# Patient Record
Sex: Female | Born: 1980 | Hispanic: Yes | Marital: Married | Smoking: Never smoker
Health system: Southern US, Community
[De-identification: ages and names within clinical notes are randomized; demographics above are authoritative.]

## PROBLEM LIST (undated history)

## (undated) DIAGNOSIS — T4145XA Adverse effect of unspecified anesthetic, initial encounter: Secondary | ICD-10-CM

## (undated) DIAGNOSIS — Z8049 Family history of malignant neoplasm of other genital organs: Secondary | ICD-10-CM

## (undated) DIAGNOSIS — E0789 Other specified disorders of thyroid: Secondary | ICD-10-CM

## (undated) DIAGNOSIS — T8859XA Other complications of anesthesia, initial encounter: Secondary | ICD-10-CM

## (undated) DIAGNOSIS — Z803 Family history of malignant neoplasm of breast: Secondary | ICD-10-CM

## (undated) DIAGNOSIS — Z8632 Personal history of gestational diabetes: Secondary | ICD-10-CM

## (undated) DIAGNOSIS — N63 Unspecified lump in unspecified breast: Secondary | ICD-10-CM

## (undated) DIAGNOSIS — T783XXA Angioneurotic edema, initial encounter: Secondary | ICD-10-CM

## (undated) DIAGNOSIS — L309 Dermatitis, unspecified: Secondary | ICD-10-CM

## (undated) DIAGNOSIS — F419 Anxiety disorder, unspecified: Secondary | ICD-10-CM

## (undated) DIAGNOSIS — I499 Cardiac arrhythmia, unspecified: Secondary | ICD-10-CM

## (undated) DIAGNOSIS — L509 Urticaria, unspecified: Secondary | ICD-10-CM

## (undated) DIAGNOSIS — T7840XA Allergy, unspecified, initial encounter: Secondary | ICD-10-CM

## (undated) DIAGNOSIS — F32A Depression, unspecified: Secondary | ICD-10-CM

## (undated) DIAGNOSIS — Z8042 Family history of malignant neoplasm of prostate: Secondary | ICD-10-CM

## (undated) DIAGNOSIS — Z800 Family history of malignant neoplasm of digestive organs: Secondary | ICD-10-CM

## (undated) DIAGNOSIS — F411 Generalized anxiety disorder: Secondary | ICD-10-CM

## (undated) DIAGNOSIS — Z8489 Family history of other specified conditions: Secondary | ICD-10-CM

## (undated) DIAGNOSIS — E119 Type 2 diabetes mellitus without complications: Secondary | ICD-10-CM

## (undated) DIAGNOSIS — G709 Myoneural disorder, unspecified: Secondary | ICD-10-CM

## (undated) DIAGNOSIS — R011 Cardiac murmur, unspecified: Secondary | ICD-10-CM

## (undated) DIAGNOSIS — N920 Excessive and frequent menstruation with regular cycle: Secondary | ICD-10-CM

## (undated) DIAGNOSIS — Z808 Family history of malignant neoplasm of other organs or systems: Secondary | ICD-10-CM

## (undated) DIAGNOSIS — F329 Major depressive disorder, single episode, unspecified: Secondary | ICD-10-CM

## (undated) DIAGNOSIS — G971 Other reaction to spinal and lumbar puncture: Secondary | ICD-10-CM

## (undated) DIAGNOSIS — M67431 Ganglion, right wrist: Secondary | ICD-10-CM

## (undated) DIAGNOSIS — R7303 Prediabetes: Secondary | ICD-10-CM

## (undated) DIAGNOSIS — A630 Anogenital (venereal) warts: Secondary | ICD-10-CM

## (undated) HISTORY — DX: Family history of malignant neoplasm of other organs or systems: Z80.8

## (undated) HISTORY — DX: Anogenital (venereal) warts: A63.0

## (undated) HISTORY — DX: Dermatitis, unspecified: L30.9

## (undated) HISTORY — DX: Allergy, unspecified, initial encounter: T78.40XA

## (undated) HISTORY — DX: Family history of malignant neoplasm of other genital organs: Z80.49

## (undated) HISTORY — DX: Family history of malignant neoplasm of breast: Z80.3

## (undated) HISTORY — DX: Other specified disorders of thyroid: E07.89

## (undated) HISTORY — DX: Ganglion, right wrist: M67.431

## (undated) HISTORY — DX: Urticaria, unspecified: L50.9

## (undated) HISTORY — DX: Myoneural disorder, unspecified: G70.9

## (undated) HISTORY — DX: Unspecified lump in unspecified breast: N63.0

## (undated) HISTORY — DX: Angioneurotic edema, initial encounter: T78.3XXA

## (undated) HISTORY — DX: Type 2 diabetes mellitus without complications: E11.9

## (undated) HISTORY — DX: Generalized anxiety disorder: F41.1

## (undated) HISTORY — DX: Cardiac murmur, unspecified: R01.1

## (undated) HISTORY — DX: Cardiac arrhythmia, unspecified: I49.9

## (undated) HISTORY — DX: Anxiety disorder, unspecified: F41.9

## (undated) HISTORY — DX: Family history of malignant neoplasm of digestive organs: Z80.0

## (undated) HISTORY — DX: Family history of malignant neoplasm of prostate: Z80.42

## (undated) HISTORY — PX: TUBAL LIGATION: SHX77

## (undated) HISTORY — DX: Depression, unspecified: F32.A

## (undated) HISTORY — DX: Excessive and frequent menstruation with regular cycle: N92.0

## (undated) HISTORY — DX: Major depressive disorder, single episode, unspecified: F32.9

---

## 2001-05-24 HISTORY — PX: NASAL SINUS SURGERY: SHX719

## 2002-12-13 ENCOUNTER — Emergency Department (HOSPITAL_COMMUNITY): Admission: EM | Admit: 2002-12-13 | Discharge: 2002-12-13 | Payer: Self-pay | Admitting: *Deleted

## 2003-01-09 ENCOUNTER — Ambulatory Visit: Admission: RE | Admit: 2003-01-09 | Discharge: 2003-01-09 | Payer: Self-pay | Admitting: *Deleted

## 2003-04-23 ENCOUNTER — Inpatient Hospital Stay (HOSPITAL_COMMUNITY): Admission: AD | Admit: 2003-04-23 | Discharge: 2003-04-23 | Payer: Self-pay | Admitting: *Deleted

## 2003-06-03 ENCOUNTER — Ambulatory Visit (HOSPITAL_COMMUNITY): Admission: RE | Admit: 2003-06-03 | Discharge: 2003-06-03 | Payer: Self-pay | Admitting: *Deleted

## 2003-06-11 ENCOUNTER — Inpatient Hospital Stay (HOSPITAL_COMMUNITY): Admission: AD | Admit: 2003-06-11 | Discharge: 2003-06-11 | Payer: Self-pay | Admitting: *Deleted

## 2003-06-14 ENCOUNTER — Encounter: Admission: RE | Admit: 2003-06-14 | Discharge: 2003-06-14 | Payer: Self-pay | Admitting: *Deleted

## 2003-06-18 ENCOUNTER — Encounter: Admission: RE | Admit: 2003-06-18 | Discharge: 2003-06-18 | Payer: Self-pay | Admitting: *Deleted

## 2003-06-18 ENCOUNTER — Inpatient Hospital Stay (HOSPITAL_COMMUNITY): Admission: AD | Admit: 2003-06-18 | Discharge: 2003-06-22 | Payer: Self-pay | Admitting: *Deleted

## 2003-06-19 ENCOUNTER — Encounter (INDEPENDENT_AMBULATORY_CARE_PROVIDER_SITE_OTHER): Payer: Self-pay | Admitting: Specialist

## 2003-08-28 ENCOUNTER — Encounter: Admission: RE | Admit: 2003-08-28 | Discharge: 2003-08-28 | Payer: Self-pay | Admitting: Family Medicine

## 2003-10-30 ENCOUNTER — Encounter: Admission: RE | Admit: 2003-10-30 | Discharge: 2003-10-30 | Payer: Self-pay | Admitting: Family Medicine

## 2003-11-27 ENCOUNTER — Encounter: Admission: RE | Admit: 2003-11-27 | Discharge: 2003-11-27 | Payer: Self-pay | Admitting: Family Medicine

## 2003-12-16 ENCOUNTER — Encounter: Admission: RE | Admit: 2003-12-16 | Discharge: 2003-12-16 | Payer: Self-pay | Admitting: Family Medicine

## 2003-12-17 ENCOUNTER — Encounter: Admission: RE | Admit: 2003-12-17 | Discharge: 2003-12-17 | Payer: Self-pay | Admitting: Family Medicine

## 2004-01-22 ENCOUNTER — Encounter: Admission: RE | Admit: 2004-01-22 | Discharge: 2004-01-22 | Payer: Self-pay | Admitting: Family Medicine

## 2004-03-11 ENCOUNTER — Encounter (INDEPENDENT_AMBULATORY_CARE_PROVIDER_SITE_OTHER): Payer: Self-pay | Admitting: Family Medicine

## 2004-03-11 ENCOUNTER — Ambulatory Visit: Payer: Self-pay | Admitting: Family Medicine

## 2004-07-08 ENCOUNTER — Ambulatory Visit: Payer: Self-pay | Admitting: Family Medicine

## 2004-07-11 ENCOUNTER — Emergency Department (HOSPITAL_COMMUNITY): Admission: EM | Admit: 2004-07-11 | Discharge: 2004-07-11 | Payer: Self-pay | Admitting: Family Medicine

## 2004-08-05 ENCOUNTER — Ambulatory Visit: Payer: Self-pay | Admitting: Family Medicine

## 2004-08-21 ENCOUNTER — Ambulatory Visit: Payer: Self-pay | Admitting: Sports Medicine

## 2004-09-01 ENCOUNTER — Encounter: Admission: RE | Admit: 2004-09-01 | Discharge: 2004-11-30 | Payer: Self-pay | Admitting: Family Medicine

## 2004-09-02 ENCOUNTER — Emergency Department (HOSPITAL_COMMUNITY): Admission: EM | Admit: 2004-09-02 | Discharge: 2004-09-02 | Payer: Self-pay | Admitting: Family Medicine

## 2004-09-02 ENCOUNTER — Emergency Department (HOSPITAL_COMMUNITY): Admission: EM | Admit: 2004-09-02 | Discharge: 2004-09-02 | Payer: Self-pay | Admitting: Emergency Medicine

## 2004-09-23 ENCOUNTER — Encounter (INDEPENDENT_AMBULATORY_CARE_PROVIDER_SITE_OTHER): Payer: Self-pay | Admitting: Family Medicine

## 2004-09-23 ENCOUNTER — Ambulatory Visit: Payer: Self-pay | Admitting: Family Medicine

## 2004-12-02 ENCOUNTER — Ambulatory Visit: Payer: Self-pay | Admitting: Family Medicine

## 2005-02-03 ENCOUNTER — Ambulatory Visit: Payer: Self-pay | Admitting: Family Medicine

## 2005-03-16 ENCOUNTER — Ambulatory Visit: Payer: Self-pay | Admitting: Sports Medicine

## 2005-03-16 ENCOUNTER — Encounter: Admission: RE | Admit: 2005-03-16 | Discharge: 2005-03-16 | Payer: Self-pay | Admitting: Family Medicine

## 2005-04-01 ENCOUNTER — Emergency Department (HOSPITAL_COMMUNITY): Admission: EM | Admit: 2005-04-01 | Discharge: 2005-04-01 | Payer: Self-pay | Admitting: Family Medicine

## 2005-04-06 ENCOUNTER — Ambulatory Visit: Payer: Self-pay | Admitting: Sports Medicine

## 2005-04-28 ENCOUNTER — Ambulatory Visit: Payer: Self-pay | Admitting: Family Medicine

## 2005-05-31 ENCOUNTER — Emergency Department (HOSPITAL_COMMUNITY): Admission: EM | Admit: 2005-05-31 | Discharge: 2005-05-31 | Payer: Self-pay | Admitting: Family Medicine

## 2005-06-15 ENCOUNTER — Ambulatory Visit: Payer: Self-pay | Admitting: Sports Medicine

## 2005-07-09 ENCOUNTER — Ambulatory Visit: Payer: Self-pay | Admitting: Sports Medicine

## 2005-07-18 ENCOUNTER — Emergency Department (HOSPITAL_COMMUNITY): Admission: EM | Admit: 2005-07-18 | Discharge: 2005-07-18 | Payer: Self-pay | Admitting: Family Medicine

## 2005-09-07 ENCOUNTER — Ambulatory Visit: Payer: Self-pay | Admitting: Sports Medicine

## 2005-09-20 ENCOUNTER — Emergency Department (HOSPITAL_COMMUNITY): Admission: EM | Admit: 2005-09-20 | Discharge: 2005-09-20 | Payer: Self-pay | Admitting: Family Medicine

## 2005-09-21 ENCOUNTER — Encounter (INDEPENDENT_AMBULATORY_CARE_PROVIDER_SITE_OTHER): Payer: Self-pay | Admitting: *Deleted

## 2005-09-24 ENCOUNTER — Ambulatory Visit: Payer: Self-pay | Admitting: Sports Medicine

## 2005-10-06 ENCOUNTER — Ambulatory Visit: Payer: Self-pay | Admitting: Family Medicine

## 2005-10-06 ENCOUNTER — Encounter (INDEPENDENT_AMBULATORY_CARE_PROVIDER_SITE_OTHER): Payer: Self-pay | Admitting: *Deleted

## 2005-10-29 ENCOUNTER — Ambulatory Visit: Payer: Self-pay | Admitting: Sports Medicine

## 2005-12-13 ENCOUNTER — Emergency Department (HOSPITAL_COMMUNITY): Admission: EM | Admit: 2005-12-13 | Discharge: 2005-12-13 | Payer: Self-pay | Admitting: Emergency Medicine

## 2006-01-18 ENCOUNTER — Ambulatory Visit: Payer: Self-pay | Admitting: Sports Medicine

## 2006-03-15 ENCOUNTER — Ambulatory Visit: Payer: Self-pay | Admitting: Sports Medicine

## 2006-06-10 ENCOUNTER — Ambulatory Visit: Payer: Self-pay | Admitting: Family Medicine

## 2006-06-10 ENCOUNTER — Encounter (INDEPENDENT_AMBULATORY_CARE_PROVIDER_SITE_OTHER): Payer: Self-pay | Admitting: Family Medicine

## 2006-06-10 LAB — CONVERTED CEMR LAB
Antibody Screen: NEGATIVE
Eosinophils Relative: 1 % (ref 0–5)
HCT: 39.2 % (ref 36.0–46.0)
Hemoglobin: 12.8 g/dL (ref 12.0–15.0)
Lymphocytes Relative: 21 % (ref 12–46)
Lymphs Abs: 2.3 10*3/uL (ref 0.7–3.3)
Monocytes Absolute: 0.6 10*3/uL (ref 0.2–0.7)
Monocytes Relative: 6 % (ref 3–11)
RDW: 13.8 % (ref 11.5–14.0)
Rh Type: POSITIVE
WBC: 10.9 10*3/uL — ABNORMAL HIGH (ref 4.0–10.5)

## 2006-06-17 ENCOUNTER — Encounter (INDEPENDENT_AMBULATORY_CARE_PROVIDER_SITE_OTHER): Payer: Self-pay | Admitting: Family Medicine

## 2006-06-17 ENCOUNTER — Ambulatory Visit: Payer: Self-pay | Admitting: Family Medicine

## 2006-06-17 ENCOUNTER — Ambulatory Visit (HOSPITAL_COMMUNITY): Admission: RE | Admit: 2006-06-17 | Discharge: 2006-06-17 | Payer: Self-pay | Admitting: Family Medicine

## 2006-06-17 LAB — CONVERTED CEMR LAB
Chlamydia, DNA Probe: NEGATIVE
GC Probe Amp, Genital: NEGATIVE

## 2006-07-06 ENCOUNTER — Encounter (INDEPENDENT_AMBULATORY_CARE_PROVIDER_SITE_OTHER): Payer: Self-pay | Admitting: *Deleted

## 2006-07-06 ENCOUNTER — Ambulatory Visit: Payer: Self-pay | Admitting: Family Medicine

## 2006-07-08 ENCOUNTER — Ambulatory Visit: Payer: Self-pay | Admitting: Family Medicine

## 2006-07-21 DIAGNOSIS — A630 Anogenital (venereal) warts: Secondary | ICD-10-CM

## 2006-07-21 DIAGNOSIS — G44209 Tension-type headache, unspecified, not intractable: Secondary | ICD-10-CM

## 2006-07-21 HISTORY — DX: Anogenital (venereal) warts: A63.0

## 2006-07-22 ENCOUNTER — Encounter (INDEPENDENT_AMBULATORY_CARE_PROVIDER_SITE_OTHER): Payer: Self-pay | Admitting: *Deleted

## 2006-08-06 ENCOUNTER — Emergency Department (HOSPITAL_COMMUNITY): Admission: EM | Admit: 2006-08-06 | Discharge: 2006-08-06 | Payer: Self-pay | Admitting: *Deleted

## 2006-08-08 ENCOUNTER — Ambulatory Visit: Payer: Self-pay | Admitting: Sports Medicine

## 2006-08-08 ENCOUNTER — Telehealth (INDEPENDENT_AMBULATORY_CARE_PROVIDER_SITE_OTHER): Payer: Self-pay | Admitting: *Deleted

## 2006-08-12 ENCOUNTER — Ambulatory Visit: Payer: Self-pay | Admitting: Family Medicine

## 2006-08-12 ENCOUNTER — Ambulatory Visit (HOSPITAL_COMMUNITY): Admission: RE | Admit: 2006-08-12 | Discharge: 2006-08-12 | Payer: Self-pay | Admitting: Gynecology

## 2006-08-29 ENCOUNTER — Encounter: Payer: Self-pay | Admitting: *Deleted

## 2006-08-30 ENCOUNTER — Ambulatory Visit (HOSPITAL_COMMUNITY): Admission: RE | Admit: 2006-08-30 | Discharge: 2006-08-30 | Payer: Self-pay | Admitting: Internal Medicine

## 2006-09-14 ENCOUNTER — Encounter (INDEPENDENT_AMBULATORY_CARE_PROVIDER_SITE_OTHER): Payer: Self-pay | Admitting: Family Medicine

## 2006-09-14 ENCOUNTER — Ambulatory Visit: Payer: Self-pay | Admitting: Family Medicine

## 2006-09-28 ENCOUNTER — Telehealth (INDEPENDENT_AMBULATORY_CARE_PROVIDER_SITE_OTHER): Payer: Self-pay | Admitting: Family Medicine

## 2006-10-03 ENCOUNTER — Ambulatory Visit: Payer: Self-pay | Admitting: Physician Assistant

## 2006-10-03 ENCOUNTER — Encounter (INDEPENDENT_AMBULATORY_CARE_PROVIDER_SITE_OTHER): Payer: Self-pay | Admitting: Family Medicine

## 2006-10-03 ENCOUNTER — Inpatient Hospital Stay (HOSPITAL_COMMUNITY): Admission: AD | Admit: 2006-10-03 | Discharge: 2006-10-04 | Payer: Self-pay | Admitting: Gynecology

## 2006-10-14 ENCOUNTER — Ambulatory Visit: Payer: Self-pay | Admitting: Family Medicine

## 2006-10-26 ENCOUNTER — Ambulatory Visit: Payer: Self-pay | Admitting: Family Medicine

## 2006-11-02 ENCOUNTER — Telehealth: Payer: Self-pay | Admitting: *Deleted

## 2006-11-04 ENCOUNTER — Encounter: Payer: Self-pay | Admitting: *Deleted

## 2006-11-10 ENCOUNTER — Ambulatory Visit: Payer: Self-pay | Admitting: Sports Medicine

## 2006-11-15 ENCOUNTER — Ambulatory Visit: Payer: Self-pay | Admitting: Family Medicine

## 2006-11-18 ENCOUNTER — Encounter (INDEPENDENT_AMBULATORY_CARE_PROVIDER_SITE_OTHER): Payer: Self-pay | Admitting: Family Medicine

## 2006-11-24 ENCOUNTER — Ambulatory Visit: Payer: Self-pay | Admitting: Family Medicine

## 2006-12-05 ENCOUNTER — Ambulatory Visit: Payer: Self-pay | Admitting: Obstetrics & Gynecology

## 2006-12-05 ENCOUNTER — Encounter: Admission: RE | Admit: 2006-12-05 | Discharge: 2006-12-09 | Payer: Self-pay | Admitting: Family Medicine

## 2006-12-06 ENCOUNTER — Inpatient Hospital Stay (HOSPITAL_COMMUNITY): Admission: AD | Admit: 2006-12-06 | Discharge: 2006-12-06 | Payer: Self-pay | Admitting: Obstetrics & Gynecology

## 2006-12-06 ENCOUNTER — Encounter: Payer: Self-pay | Admitting: *Deleted

## 2006-12-08 ENCOUNTER — Ambulatory Visit: Payer: Self-pay | Admitting: Obstetrics & Gynecology

## 2006-12-12 ENCOUNTER — Inpatient Hospital Stay (HOSPITAL_COMMUNITY): Admission: AD | Admit: 2006-12-12 | Discharge: 2006-12-13 | Payer: Self-pay | Admitting: Obstetrics & Gynecology

## 2006-12-12 ENCOUNTER — Ambulatory Visit: Payer: Self-pay | Admitting: Family Medicine

## 2006-12-13 ENCOUNTER — Ambulatory Visit: Payer: Self-pay | Admitting: Obstetrics and Gynecology

## 2006-12-14 ENCOUNTER — Inpatient Hospital Stay (HOSPITAL_COMMUNITY): Admission: AD | Admit: 2006-12-14 | Discharge: 2006-12-14 | Payer: Self-pay | Admitting: Obstetrics and Gynecology

## 2006-12-14 ENCOUNTER — Ambulatory Visit: Payer: Self-pay | Admitting: *Deleted

## 2006-12-15 ENCOUNTER — Ambulatory Visit: Payer: Self-pay | Admitting: Obstetrics & Gynecology

## 2006-12-19 ENCOUNTER — Ambulatory Visit: Payer: Self-pay | Admitting: Obstetrics & Gynecology

## 2006-12-22 ENCOUNTER — Ambulatory Visit: Payer: Self-pay | Admitting: Obstetrics and Gynecology

## 2006-12-26 ENCOUNTER — Ambulatory Visit: Payer: Self-pay | Admitting: Obstetrics & Gynecology

## 2006-12-29 ENCOUNTER — Ambulatory Visit: Payer: Self-pay | Admitting: Family Medicine

## 2007-01-02 ENCOUNTER — Ambulatory Visit: Payer: Self-pay | Admitting: Obstetrics & Gynecology

## 2007-01-05 ENCOUNTER — Ambulatory Visit: Payer: Self-pay | Admitting: Obstetrics and Gynecology

## 2007-01-09 ENCOUNTER — Ambulatory Visit: Payer: Self-pay | Admitting: Obstetrics & Gynecology

## 2007-01-12 ENCOUNTER — Ambulatory Visit: Payer: Self-pay | Admitting: Obstetrics & Gynecology

## 2007-01-16 ENCOUNTER — Inpatient Hospital Stay (HOSPITAL_COMMUNITY): Admission: AD | Admit: 2007-01-16 | Discharge: 2007-01-20 | Payer: Self-pay | Admitting: Obstetrics & Gynecology

## 2007-01-16 ENCOUNTER — Ambulatory Visit: Payer: Self-pay | Admitting: Obstetrics & Gynecology

## 2007-01-16 ENCOUNTER — Encounter: Payer: Self-pay | Admitting: *Deleted

## 2007-01-18 ENCOUNTER — Ambulatory Visit: Payer: Self-pay | Admitting: Gynecology

## 2007-01-31 ENCOUNTER — Ambulatory Visit: Payer: Self-pay | Admitting: Family Medicine

## 2007-01-31 ENCOUNTER — Encounter: Payer: Self-pay | Admitting: Family Medicine

## 2007-01-31 ENCOUNTER — Telehealth (INDEPENDENT_AMBULATORY_CARE_PROVIDER_SITE_OTHER): Payer: Self-pay | Admitting: *Deleted

## 2007-01-31 LAB — CONVERTED CEMR LAB
Ketones, urine, test strip: NEGATIVE
Nitrite: NEGATIVE
Urobilinogen, UA: 0.2
Whiff Test: NEGATIVE

## 2007-02-02 ENCOUNTER — Ambulatory Visit: Payer: Self-pay | Admitting: Family Medicine

## 2007-02-06 ENCOUNTER — Telehealth (INDEPENDENT_AMBULATORY_CARE_PROVIDER_SITE_OTHER): Payer: Self-pay | Admitting: *Deleted

## 2007-03-16 ENCOUNTER — Ambulatory Visit: Payer: Self-pay | Admitting: Family Medicine

## 2007-03-16 ENCOUNTER — Encounter (INDEPENDENT_AMBULATORY_CARE_PROVIDER_SITE_OTHER): Payer: Self-pay | Admitting: Family Medicine

## 2007-03-16 LAB — CONVERTED CEMR LAB
BUN: 13 mg/dL (ref 6–23)
CO2: 26 meq/L (ref 19–32)
Calcium: 9.6 mg/dL (ref 8.4–10.5)
Creatinine, Ser: 0.7 mg/dL (ref 0.40–1.20)
HCT: 41.5 % (ref 36.0–46.0)
Hemoglobin: 13.2 g/dL (ref 12.0–15.0)
MCV: 97 fL (ref 78.0–100.0)
Platelets: 327 10*3/uL (ref 150–400)
WBC: 9.1 10*3/uL (ref 4.0–10.5)

## 2007-03-22 ENCOUNTER — Ambulatory Visit: Payer: Self-pay | Admitting: Family Medicine

## 2007-03-27 ENCOUNTER — Ambulatory Visit (HOSPITAL_COMMUNITY): Admission: RE | Admit: 2007-03-27 | Discharge: 2007-03-27 | Payer: Self-pay | Admitting: Family Medicine

## 2007-09-22 ENCOUNTER — Ambulatory Visit: Payer: Self-pay | Admitting: Family Medicine

## 2007-09-22 ENCOUNTER — Encounter (INDEPENDENT_AMBULATORY_CARE_PROVIDER_SITE_OTHER): Payer: Self-pay | Admitting: Family Medicine

## 2007-09-22 LAB — CONVERTED CEMR LAB
BUN: 11 mg/dL (ref 6–23)
Beta hcg, urine, semiquantitative: NEGATIVE
Calcium: 9.5 mg/dL (ref 8.4–10.5)
Chloride: 104 meq/L (ref 96–112)
Creatinine, Ser: 0.63 mg/dL (ref 0.40–1.20)
HCT: 38.6 % (ref 36.0–46.0)
Hemoglobin: 13 g/dL (ref 12.0–15.0)
MCHC: 33.7 g/dL (ref 30.0–36.0)
MCV: 93 fL (ref 78.0–100.0)
Platelets: 277 10*3/uL (ref 150–400)
RDW: 13.6 % (ref 11.5–15.5)
TSH: 0.962 microintl units/mL (ref 0.350–5.50)

## 2007-09-28 ENCOUNTER — Ambulatory Visit: Payer: Self-pay | Admitting: Family Medicine

## 2007-09-28 ENCOUNTER — Telehealth: Payer: Self-pay | Admitting: *Deleted

## 2007-10-12 ENCOUNTER — Encounter (INDEPENDENT_AMBULATORY_CARE_PROVIDER_SITE_OTHER): Payer: Self-pay | Admitting: *Deleted

## 2007-10-21 ENCOUNTER — Emergency Department (HOSPITAL_COMMUNITY): Admission: EM | Admit: 2007-10-21 | Discharge: 2007-10-21 | Payer: Self-pay | Admitting: Emergency Medicine

## 2007-10-26 ENCOUNTER — Ambulatory Visit (HOSPITAL_COMMUNITY): Admission: RE | Admit: 2007-10-26 | Discharge: 2007-10-26 | Payer: Self-pay | Admitting: Emergency Medicine

## 2007-11-03 ENCOUNTER — Telehealth: Payer: Self-pay | Admitting: Family Medicine

## 2007-12-20 ENCOUNTER — Telehealth: Payer: Self-pay | Admitting: *Deleted

## 2007-12-21 ENCOUNTER — Ambulatory Visit: Payer: Self-pay | Admitting: Family Medicine

## 2007-12-21 LAB — CONVERTED CEMR LAB
Beta hcg, urine, semiquantitative: NEGATIVE
Bilirubin Urine: NEGATIVE
Glucose, Urine, Semiquant: NEGATIVE
Specific Gravity, Urine: 1.025
WBC Urine, dipstick: NEGATIVE
pH: 5.5

## 2007-12-22 ENCOUNTER — Ambulatory Visit: Payer: Self-pay | Admitting: Family Medicine

## 2007-12-22 ENCOUNTER — Ambulatory Visit (HOSPITAL_COMMUNITY): Admission: RE | Admit: 2007-12-22 | Discharge: 2007-12-22 | Payer: Self-pay | Admitting: Family Medicine

## 2007-12-22 ENCOUNTER — Encounter: Payer: Self-pay | Admitting: Family Medicine

## 2007-12-25 ENCOUNTER — Telehealth: Payer: Self-pay | Admitting: *Deleted

## 2007-12-26 ENCOUNTER — Encounter: Payer: Self-pay | Admitting: *Deleted

## 2007-12-26 ENCOUNTER — Ambulatory Visit: Payer: Self-pay | Admitting: Family Medicine

## 2008-01-02 ENCOUNTER — Encounter: Payer: Self-pay | Admitting: Family Medicine

## 2008-01-10 ENCOUNTER — Encounter (INDEPENDENT_AMBULATORY_CARE_PROVIDER_SITE_OTHER): Payer: Self-pay | Admitting: *Deleted

## 2008-01-11 ENCOUNTER — Emergency Department (HOSPITAL_COMMUNITY): Admission: EM | Admit: 2008-01-11 | Discharge: 2008-01-11 | Payer: Self-pay | Admitting: Emergency Medicine

## 2008-02-07 ENCOUNTER — Telehealth: Payer: Self-pay | Admitting: *Deleted

## 2008-02-07 ENCOUNTER — Ambulatory Visit: Payer: Self-pay | Admitting: Family Medicine

## 2008-02-07 ENCOUNTER — Encounter (INDEPENDENT_AMBULATORY_CARE_PROVIDER_SITE_OTHER): Payer: Self-pay | Admitting: Family Medicine

## 2008-02-08 LAB — CONVERTED CEMR LAB: GC Probe Amp, Genital: NEGATIVE

## 2008-02-23 ENCOUNTER — Ambulatory Visit: Payer: Self-pay | Admitting: Family Medicine

## 2008-02-26 ENCOUNTER — Telehealth (INDEPENDENT_AMBULATORY_CARE_PROVIDER_SITE_OTHER): Payer: Self-pay | Admitting: *Deleted

## 2008-02-27 ENCOUNTER — Encounter (INDEPENDENT_AMBULATORY_CARE_PROVIDER_SITE_OTHER): Payer: Self-pay | Admitting: *Deleted

## 2008-02-28 ENCOUNTER — Ambulatory Visit: Payer: Self-pay | Admitting: Family Medicine

## 2008-02-28 ENCOUNTER — Encounter (INDEPENDENT_AMBULATORY_CARE_PROVIDER_SITE_OTHER): Payer: Self-pay | Admitting: Family Medicine

## 2008-02-28 LAB — CONVERTED CEMR LAB
Beta hcg, urine, semiquantitative: NEGATIVE
Bilirubin Urine: NEGATIVE
Glucose, Urine, Semiquant: NEGATIVE
Protein, U semiquant: NEGATIVE
Specific Gravity, Urine: 1.015
pH: 5.5

## 2008-03-01 ENCOUNTER — Telehealth (INDEPENDENT_AMBULATORY_CARE_PROVIDER_SITE_OTHER): Payer: Self-pay | Admitting: Family Medicine

## 2008-03-20 ENCOUNTER — Ambulatory Visit: Payer: Self-pay | Admitting: Family Medicine

## 2008-03-20 ENCOUNTER — Encounter (INDEPENDENT_AMBULATORY_CARE_PROVIDER_SITE_OTHER): Payer: Self-pay | Admitting: Family Medicine

## 2008-03-20 DIAGNOSIS — D352 Benign neoplasm of pituitary gland: Secondary | ICD-10-CM | POA: Insufficient documentation

## 2008-03-20 DIAGNOSIS — D353 Benign neoplasm of craniopharyngeal duct: Secondary | ICD-10-CM

## 2008-03-20 HISTORY — DX: Benign neoplasm of pituitary gland: D35.2

## 2008-03-22 ENCOUNTER — Telehealth: Payer: Self-pay | Admitting: *Deleted

## 2008-03-28 ENCOUNTER — Encounter: Admission: RE | Admit: 2008-03-28 | Discharge: 2008-03-28 | Payer: Self-pay | Admitting: Family Medicine

## 2008-03-28 ENCOUNTER — Encounter: Payer: Self-pay | Admitting: Family Medicine

## 2008-06-21 ENCOUNTER — Emergency Department (HOSPITAL_COMMUNITY): Admission: EM | Admit: 2008-06-21 | Discharge: 2008-06-21 | Payer: Self-pay | Admitting: Family Medicine

## 2008-07-04 ENCOUNTER — Telehealth: Payer: Self-pay | Admitting: Family Medicine

## 2008-08-15 ENCOUNTER — Ambulatory Visit: Payer: Self-pay | Admitting: Family Medicine

## 2008-08-28 ENCOUNTER — Ambulatory Visit: Payer: Self-pay | Admitting: Family Medicine

## 2008-08-28 ENCOUNTER — Encounter: Payer: Self-pay | Admitting: Family Medicine

## 2008-08-28 DIAGNOSIS — F3289 Other specified depressive episodes: Secondary | ICD-10-CM | POA: Insufficient documentation

## 2008-08-28 DIAGNOSIS — F329 Major depressive disorder, single episode, unspecified: Secondary | ICD-10-CM | POA: Insufficient documentation

## 2008-08-29 LAB — CONVERTED CEMR LAB
CO2: 24 meq/L (ref 19–32)
Calcium: 9.4 mg/dL (ref 8.4–10.5)
Creatinine, Ser: 0.64 mg/dL (ref 0.40–1.20)
Glucose, Bld: 89 mg/dL (ref 70–99)
HCT: 39.4 % (ref 36.0–46.0)
MCHC: 33.2 g/dL (ref 30.0–36.0)
MCV: 91.8 fL (ref 78.0–100.0)
Platelets: 304 10*3/uL (ref 150–400)
RBC: 4.29 M/uL (ref 3.87–5.11)
Sodium: 137 meq/L (ref 135–145)
TSH: 0.836 microintl units/mL (ref 0.350–4.500)
WBC: 11.5 10*3/uL — ABNORMAL HIGH (ref 4.0–10.5)

## 2008-10-03 ENCOUNTER — Emergency Department (HOSPITAL_COMMUNITY): Admission: EM | Admit: 2008-10-03 | Discharge: 2008-10-03 | Payer: Self-pay | Admitting: Emergency Medicine

## 2008-11-27 ENCOUNTER — Encounter: Payer: Self-pay | Admitting: Family Medicine

## 2008-11-28 ENCOUNTER — Encounter: Payer: Self-pay | Admitting: Family Medicine

## 2008-11-28 ENCOUNTER — Ambulatory Visit: Payer: Self-pay | Admitting: Family Medicine

## 2008-11-28 DIAGNOSIS — E663 Overweight: Secondary | ICD-10-CM | POA: Insufficient documentation

## 2008-11-29 ENCOUNTER — Telehealth: Payer: Self-pay | Admitting: Family Medicine

## 2008-11-29 ENCOUNTER — Ambulatory Visit (HOSPITAL_COMMUNITY): Admission: RE | Admit: 2008-11-29 | Discharge: 2008-11-29 | Payer: Self-pay | Admitting: Obstetrics & Gynecology

## 2008-11-29 ENCOUNTER — Encounter: Payer: Self-pay | Admitting: Family Medicine

## 2008-12-02 ENCOUNTER — Encounter: Payer: Self-pay | Admitting: Family Medicine

## 2008-12-02 ENCOUNTER — Ambulatory Visit: Payer: Self-pay | Admitting: Family Medicine

## 2008-12-03 ENCOUNTER — Telehealth: Payer: Self-pay | Admitting: Family Medicine

## 2008-12-04 ENCOUNTER — Encounter: Payer: Self-pay | Admitting: Family Medicine

## 2008-12-04 ENCOUNTER — Ambulatory Visit: Payer: Self-pay | Admitting: Family Medicine

## 2008-12-04 LAB — CONVERTED CEMR LAB
Antibody Screen: NEGATIVE
Basophils Relative: 1 % (ref 0–1)
Eosinophils Absolute: 0.1 10*3/uL (ref 0.0–0.7)
Eosinophils Relative: 2 % (ref 0–5)
HCT: 39.1 % (ref 36.0–46.0)
Hemoglobin: 12.8 g/dL (ref 12.0–15.0)
Lymphs Abs: 2.1 10*3/uL (ref 0.7–4.0)
MCHC: 32.7 g/dL (ref 30.0–36.0)
MCV: 94.7 fL (ref 78.0–100.0)
Monocytes Absolute: 0.6 10*3/uL (ref 0.1–1.0)
Monocytes Relative: 7 % (ref 3–12)
RBC: 4.13 M/uL (ref 3.87–5.11)
Rh Type: POSITIVE
Rubella: 128.5 intl units/mL — ABNORMAL HIGH
Sickle Cell Screen: NEGATIVE
WBC: 8.6 10*3/uL (ref 4.0–10.5)

## 2008-12-05 ENCOUNTER — Telehealth: Payer: Self-pay | Admitting: Family Medicine

## 2008-12-09 ENCOUNTER — Encounter: Payer: Self-pay | Admitting: Family Medicine

## 2008-12-09 ENCOUNTER — Ambulatory Visit (HOSPITAL_COMMUNITY): Admission: RE | Admit: 2008-12-09 | Discharge: 2008-12-09 | Payer: Self-pay | Admitting: Family Medicine

## 2008-12-14 ENCOUNTER — Telehealth: Payer: Self-pay | Admitting: Family Medicine

## 2008-12-16 ENCOUNTER — Ambulatory Visit: Payer: Self-pay | Admitting: Family Medicine

## 2008-12-16 ENCOUNTER — Telehealth: Payer: Self-pay | Admitting: Family Medicine

## 2008-12-16 LAB — CONVERTED CEMR LAB: Whiff Test: NEGATIVE

## 2008-12-26 ENCOUNTER — Encounter: Payer: Self-pay | Admitting: Family Medicine

## 2008-12-26 ENCOUNTER — Ambulatory Visit: Payer: Self-pay | Admitting: Family Medicine

## 2008-12-26 LAB — CONVERTED CEMR LAB: Chlamydia, DNA Probe: NEGATIVE

## 2008-12-29 ENCOUNTER — Encounter: Payer: Self-pay | Admitting: Family Medicine

## 2008-12-31 ENCOUNTER — Ambulatory Visit: Payer: Self-pay | Admitting: Family Medicine

## 2008-12-31 ENCOUNTER — Encounter: Payer: Self-pay | Admitting: Family Medicine

## 2008-12-31 DIAGNOSIS — F411 Generalized anxiety disorder: Secondary | ICD-10-CM | POA: Insufficient documentation

## 2008-12-31 HISTORY — DX: Generalized anxiety disorder: F41.1

## 2009-01-01 ENCOUNTER — Telehealth: Payer: Self-pay | Admitting: Family Medicine

## 2009-01-07 ENCOUNTER — Ambulatory Visit (HOSPITAL_COMMUNITY): Admission: RE | Admit: 2009-01-07 | Discharge: 2009-01-07 | Payer: Self-pay | Admitting: Family Medicine

## 2009-01-07 ENCOUNTER — Ambulatory Visit: Payer: Self-pay | Admitting: Family Medicine

## 2009-01-07 ENCOUNTER — Telehealth: Payer: Self-pay | Admitting: Sports Medicine

## 2009-01-07 ENCOUNTER — Encounter: Payer: Self-pay | Admitting: Sports Medicine

## 2009-01-09 ENCOUNTER — Telehealth: Payer: Self-pay | Admitting: Sports Medicine

## 2009-01-20 ENCOUNTER — Telehealth: Payer: Self-pay | Admitting: Family Medicine

## 2009-01-21 ENCOUNTER — Ambulatory Visit: Payer: Self-pay | Admitting: Family Medicine

## 2009-01-21 LAB — CONVERTED CEMR LAB
Ketones, urine, test strip: NEGATIVE
Nitrite: NEGATIVE
Urobilinogen, UA: 0.2

## 2009-01-24 ENCOUNTER — Telehealth (INDEPENDENT_AMBULATORY_CARE_PROVIDER_SITE_OTHER): Payer: Self-pay | Admitting: *Deleted

## 2009-01-28 ENCOUNTER — Telehealth: Payer: Self-pay | Admitting: Family Medicine

## 2009-01-28 ENCOUNTER — Ambulatory Visit: Payer: Self-pay | Admitting: Family Medicine

## 2009-02-01 ENCOUNTER — Inpatient Hospital Stay (HOSPITAL_COMMUNITY): Admission: AD | Admit: 2009-02-01 | Discharge: 2009-02-01 | Payer: Self-pay | Admitting: Obstetrics & Gynecology

## 2009-02-01 ENCOUNTER — Ambulatory Visit: Payer: Self-pay | Admitting: Obstetrics and Gynecology

## 2009-02-05 ENCOUNTER — Encounter: Payer: Self-pay | Admitting: Family Medicine

## 2009-02-11 ENCOUNTER — Ambulatory Visit: Payer: Self-pay | Admitting: Family Medicine

## 2009-02-11 LAB — CONVERTED CEMR LAB
Glucose, Urine, Semiquant: NEGATIVE
Ketones, urine, test strip: NEGATIVE
Nitrite: NEGATIVE
Specific Gravity, Urine: 1.015
WBC Urine, dipstick: NEGATIVE
Whiff Test: NEGATIVE

## 2009-02-13 ENCOUNTER — Telehealth: Payer: Self-pay | Admitting: Family Medicine

## 2009-02-23 ENCOUNTER — Telehealth: Payer: Self-pay | Admitting: Family Medicine

## 2009-02-25 ENCOUNTER — Ambulatory Visit: Payer: Self-pay | Admitting: Family Medicine

## 2009-03-08 ENCOUNTER — Emergency Department (HOSPITAL_COMMUNITY): Admission: EM | Admit: 2009-03-08 | Discharge: 2009-03-08 | Payer: Self-pay | Admitting: Family Medicine

## 2009-03-10 ENCOUNTER — Ambulatory Visit (HOSPITAL_COMMUNITY): Admission: RE | Admit: 2009-03-10 | Discharge: 2009-03-10 | Payer: Self-pay | Admitting: Family Medicine

## 2009-03-10 ENCOUNTER — Encounter: Payer: Self-pay | Admitting: Family Medicine

## 2009-03-13 ENCOUNTER — Encounter: Payer: Self-pay | Admitting: Family Medicine

## 2009-03-13 ENCOUNTER — Ambulatory Visit: Payer: Self-pay | Admitting: Family Medicine

## 2009-03-19 ENCOUNTER — Ambulatory Visit: Payer: Self-pay | Admitting: Family Medicine

## 2009-03-20 ENCOUNTER — Encounter: Payer: Self-pay | Admitting: Family Medicine

## 2009-03-21 ENCOUNTER — Inpatient Hospital Stay (HOSPITAL_COMMUNITY): Admission: AD | Admit: 2009-03-21 | Discharge: 2009-03-22 | Payer: Self-pay | Admitting: Family Medicine

## 2009-03-21 ENCOUNTER — Ambulatory Visit: Payer: Self-pay | Admitting: Family Medicine

## 2009-03-24 ENCOUNTER — Telehealth: Payer: Self-pay | Admitting: *Deleted

## 2009-04-02 ENCOUNTER — Ambulatory Visit: Payer: Self-pay | Admitting: Family Medicine

## 2009-04-14 ENCOUNTER — Ambulatory Visit: Payer: Self-pay | Admitting: Family Medicine

## 2009-04-28 ENCOUNTER — Ambulatory Visit: Payer: Self-pay | Admitting: Family Medicine

## 2009-04-28 ENCOUNTER — Encounter: Payer: Self-pay | Admitting: Family Medicine

## 2009-04-30 ENCOUNTER — Ambulatory Visit: Payer: Self-pay | Admitting: Family Medicine

## 2009-05-20 ENCOUNTER — Encounter: Payer: Self-pay | Admitting: Family Medicine

## 2009-05-20 ENCOUNTER — Ambulatory Visit: Payer: Self-pay | Admitting: Family Medicine

## 2009-05-20 LAB — CONVERTED CEMR LAB
MCHC: 32.2 g/dL (ref 30.0–36.0)
RBC: 3.78 M/uL — ABNORMAL LOW (ref 3.87–5.11)
RDW: 14.6 % (ref 11.5–15.5)

## 2009-05-21 ENCOUNTER — Encounter: Payer: Self-pay | Admitting: Family Medicine

## 2009-05-29 ENCOUNTER — Ambulatory Visit: Payer: Self-pay | Admitting: Family Medicine

## 2009-06-02 ENCOUNTER — Telehealth: Payer: Self-pay | Admitting: Family Medicine

## 2009-06-03 ENCOUNTER — Telehealth: Payer: Self-pay | Admitting: *Deleted

## 2009-06-03 ENCOUNTER — Ambulatory Visit: Payer: Self-pay | Admitting: Family Medicine

## 2009-06-03 ENCOUNTER — Encounter: Payer: Self-pay | Admitting: Family Medicine

## 2009-06-03 LAB — CONVERTED CEMR LAB
Nitrite: NEGATIVE
Specific Gravity, Urine: 1.015

## 2009-06-05 ENCOUNTER — Ambulatory Visit: Payer: Self-pay | Admitting: Family Medicine

## 2009-06-05 ENCOUNTER — Inpatient Hospital Stay (HOSPITAL_COMMUNITY): Admission: AD | Admit: 2009-06-05 | Discharge: 2009-06-05 | Payer: Self-pay | Admitting: Obstetrics and Gynecology

## 2009-06-05 ENCOUNTER — Telehealth: Payer: Self-pay | Admitting: Family Medicine

## 2009-06-11 ENCOUNTER — Ambulatory Visit: Payer: Self-pay | Admitting: Family Medicine

## 2009-06-11 ENCOUNTER — Encounter: Payer: Self-pay | Admitting: Family Medicine

## 2009-06-11 ENCOUNTER — Ambulatory Visit (HOSPITAL_COMMUNITY): Admission: RE | Admit: 2009-06-11 | Discharge: 2009-06-11 | Payer: Self-pay | Admitting: Family Medicine

## 2009-06-19 ENCOUNTER — Encounter: Payer: Self-pay | Admitting: Family Medicine

## 2009-06-19 ENCOUNTER — Ambulatory Visit: Payer: Self-pay | Admitting: Family Medicine

## 2009-06-20 ENCOUNTER — Telehealth: Payer: Self-pay | Admitting: Family Medicine

## 2009-06-20 DIAGNOSIS — O99810 Abnormal glucose complicating pregnancy: Secondary | ICD-10-CM | POA: Insufficient documentation

## 2009-06-24 ENCOUNTER — Ambulatory Visit: Payer: Self-pay | Admitting: Family Medicine

## 2009-06-27 ENCOUNTER — Ambulatory Visit: Payer: Self-pay | Admitting: Family Medicine

## 2009-06-30 ENCOUNTER — Encounter: Payer: Self-pay | Admitting: Obstetrics & Gynecology

## 2009-06-30 ENCOUNTER — Encounter: Admission: RE | Admit: 2009-06-30 | Discharge: 2009-07-21 | Payer: Self-pay | Admitting: Obstetrics & Gynecology

## 2009-06-30 ENCOUNTER — Ambulatory Visit: Payer: Self-pay | Admitting: Obstetrics & Gynecology

## 2009-07-03 ENCOUNTER — Ambulatory Visit: Payer: Self-pay | Admitting: Obstetrics & Gynecology

## 2009-07-07 ENCOUNTER — Ambulatory Visit: Payer: Self-pay | Admitting: Obstetrics & Gynecology

## 2009-07-07 ENCOUNTER — Encounter: Payer: Self-pay | Admitting: Family

## 2009-07-07 LAB — CONVERTED CEMR LAB
Albumin: 3.3 g/dL — ABNORMAL LOW (ref 3.5–5.2)
CO2: 21 meq/L (ref 19–32)
Glucose, Bld: 84 mg/dL (ref 70–99)
Potassium: 4.1 meq/L (ref 3.5–5.3)
Sodium: 137 meq/L (ref 135–145)
Total Protein: 6.5 g/dL (ref 6.0–8.3)

## 2009-07-10 ENCOUNTER — Ambulatory Visit (HOSPITAL_COMMUNITY): Admission: RE | Admit: 2009-07-10 | Discharge: 2009-07-10 | Payer: Self-pay | Admitting: Obstetrics & Gynecology

## 2009-07-10 ENCOUNTER — Ambulatory Visit: Payer: Self-pay | Admitting: Obstetrics & Gynecology

## 2009-07-14 ENCOUNTER — Ambulatory Visit: Payer: Self-pay | Admitting: Obstetrics & Gynecology

## 2009-07-14 ENCOUNTER — Encounter: Payer: Self-pay | Admitting: Advanced Practice Midwife

## 2009-07-14 LAB — CONVERTED CEMR LAB
Chlamydia, DNA Probe: NEGATIVE
GC Probe Amp, Genital: NEGATIVE

## 2009-07-15 ENCOUNTER — Encounter: Payer: Self-pay | Admitting: Obstetrics & Gynecology

## 2009-07-15 ENCOUNTER — Encounter: Payer: Self-pay | Admitting: Advanced Practice Midwife

## 2009-07-17 ENCOUNTER — Ambulatory Visit: Payer: Self-pay | Admitting: Obstetrics and Gynecology

## 2009-07-19 ENCOUNTER — Inpatient Hospital Stay (HOSPITAL_COMMUNITY): Admission: AD | Admit: 2009-07-19 | Discharge: 2009-07-19 | Payer: Self-pay | Admitting: Obstetrics and Gynecology

## 2009-07-21 ENCOUNTER — Ambulatory Visit: Payer: Self-pay | Admitting: Obstetrics & Gynecology

## 2009-07-22 ENCOUNTER — Encounter: Payer: Self-pay | Admitting: Obstetrics & Gynecology

## 2009-07-24 ENCOUNTER — Ambulatory Visit: Payer: Self-pay | Admitting: Obstetrics and Gynecology

## 2009-07-25 ENCOUNTER — Ambulatory Visit: Payer: Self-pay | Admitting: Obstetrics & Gynecology

## 2009-07-25 ENCOUNTER — Ambulatory Visit (HOSPITAL_COMMUNITY): Admission: RE | Admit: 2009-07-25 | Discharge: 2009-07-25 | Payer: Self-pay | Admitting: Obstetrics & Gynecology

## 2009-07-25 ENCOUNTER — Inpatient Hospital Stay (HOSPITAL_COMMUNITY): Admission: RE | Admit: 2009-07-25 | Discharge: 2009-07-27 | Payer: Self-pay | Admitting: Obstetrics and Gynecology

## 2009-09-03 ENCOUNTER — Encounter: Payer: Self-pay | Admitting: Family Medicine

## 2009-09-03 ENCOUNTER — Ambulatory Visit: Payer: Self-pay | Admitting: Family Medicine

## 2009-09-04 LAB — CONVERTED CEMR LAB: Glucose, 2 hour: 129 mg/dL (ref 70–139)

## 2009-09-08 ENCOUNTER — Encounter: Payer: Self-pay | Admitting: Family Medicine

## 2009-09-09 ENCOUNTER — Ambulatory Visit (HOSPITAL_COMMUNITY): Admission: RE | Admit: 2009-09-09 | Discharge: 2009-09-09 | Payer: Self-pay | Admitting: Family Medicine

## 2009-09-10 ENCOUNTER — Emergency Department (HOSPITAL_COMMUNITY): Admission: EM | Admit: 2009-09-10 | Discharge: 2009-09-10 | Payer: Self-pay | Admitting: Family Medicine

## 2009-09-11 ENCOUNTER — Telehealth: Payer: Self-pay | Admitting: Family Medicine

## 2009-09-18 ENCOUNTER — Ambulatory Visit: Payer: Self-pay | Admitting: Family Medicine

## 2009-09-18 DIAGNOSIS — K760 Fatty (change of) liver, not elsewhere classified: Secondary | ICD-10-CM

## 2009-09-18 DIAGNOSIS — R1011 Right upper quadrant pain: Secondary | ICD-10-CM | POA: Insufficient documentation

## 2009-09-18 HISTORY — DX: Fatty (change of) liver, not elsewhere classified: K76.0

## 2009-09-22 ENCOUNTER — Ambulatory Visit (HOSPITAL_COMMUNITY): Admission: RE | Admit: 2009-09-22 | Discharge: 2009-09-22 | Payer: Self-pay | Admitting: Family Medicine

## 2009-09-22 ENCOUNTER — Telehealth: Payer: Self-pay | Admitting: Family Medicine

## 2009-09-30 ENCOUNTER — Encounter: Payer: Self-pay | Admitting: Family Medicine

## 2010-05-13 IMAGING — CT CT ABDOMEN W/ CM
2 of 4 series · 16 of 46 positions shown, 18 images · IV contrast (agent unspecified)
Comparison: None

CT ABDOMEN

CLINICAL DATA: Right lower quadrant abdominal pain and nausea.

CT ABDOMEN AND PELVIS WITH CONTRAST
TECHNIQUE: Multidetector CT imaging of the abdomen and pelvis was
performed using the standard protocol following bolus
administration of intravenous contrast.
Contrast: 100 ml of Smnipaque-ZCC

[Series 2: abd/pelv with 5.0 b31f st · axial · 0.71mm/px · z∈[-410,-30]mm · 13 of 84 slices shown, 15 images]
[im 4/84  soft-tissue]
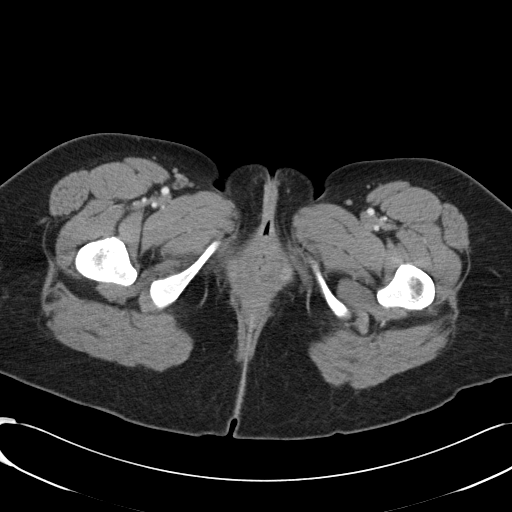
[im 4/84  bone]
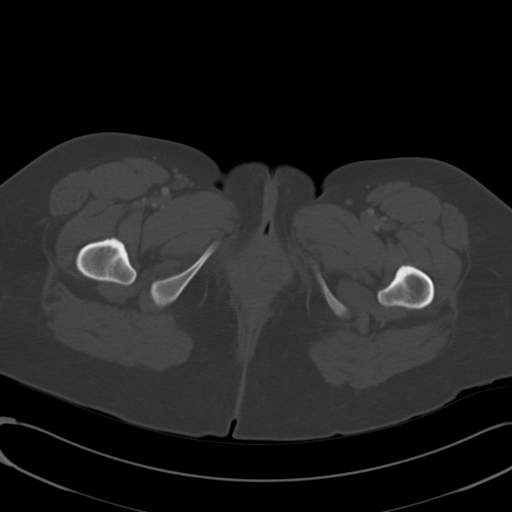
[im 11/84  soft-tissue]
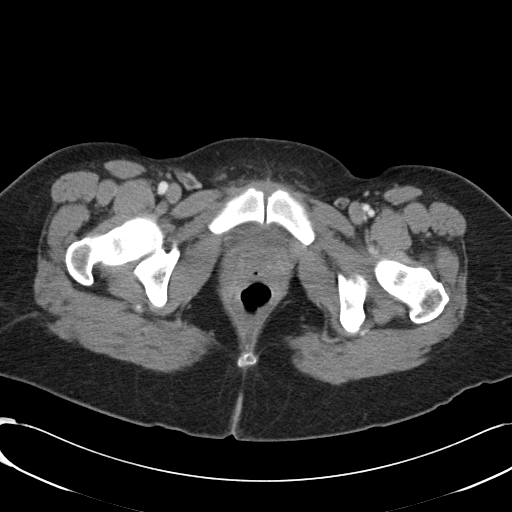
[im 18/84  soft-tissue]
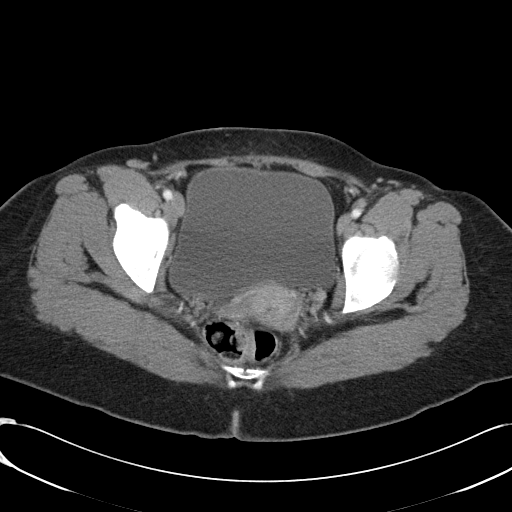
[im 25/84  soft-tissue]
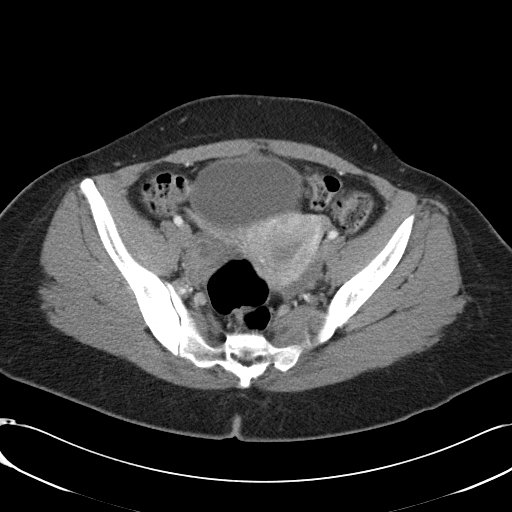
[im 28/84  soft-tissue]
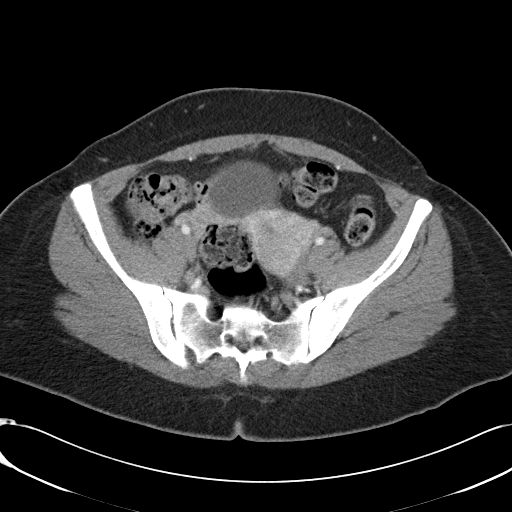
[im 35/84  soft-tissue]
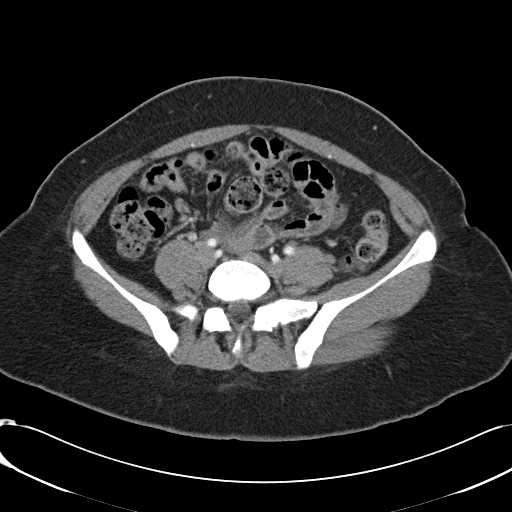
[im 42/84  soft-tissue]
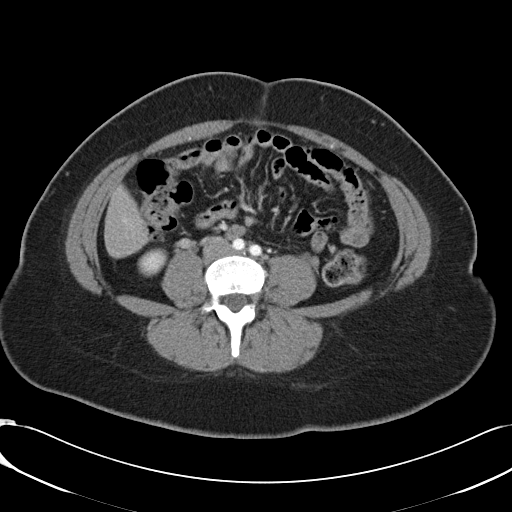
[im 49/84  soft-tissue]
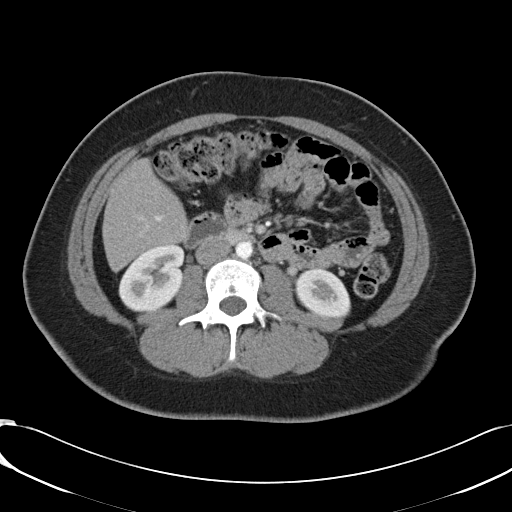
[im 56/84  soft-tissue]
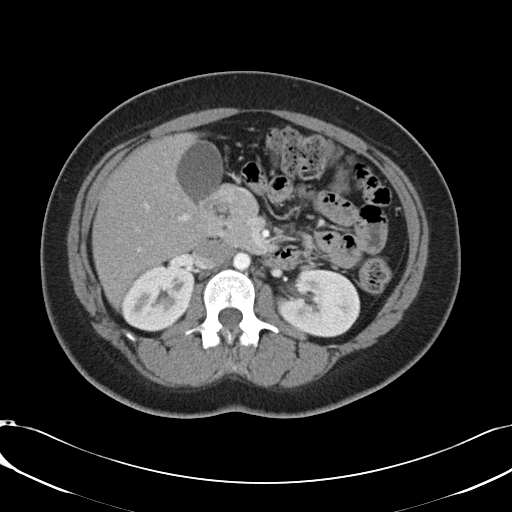
[im 56/84  bone]
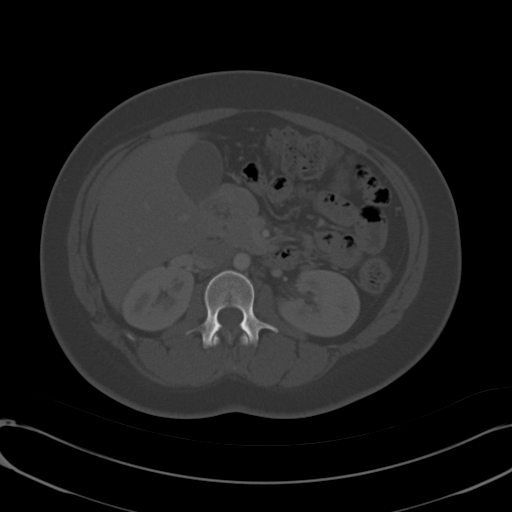
[im 59/84  soft-tissue]
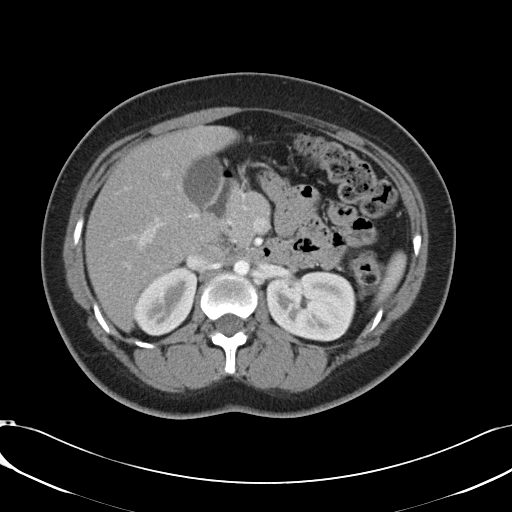
[im 66/84  soft-tissue]
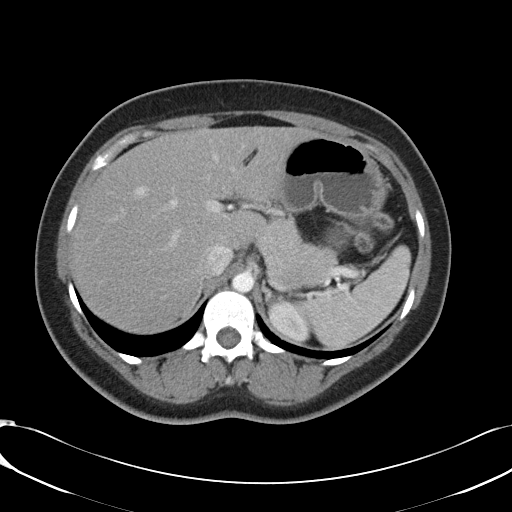
[im 73/84  soft-tissue]
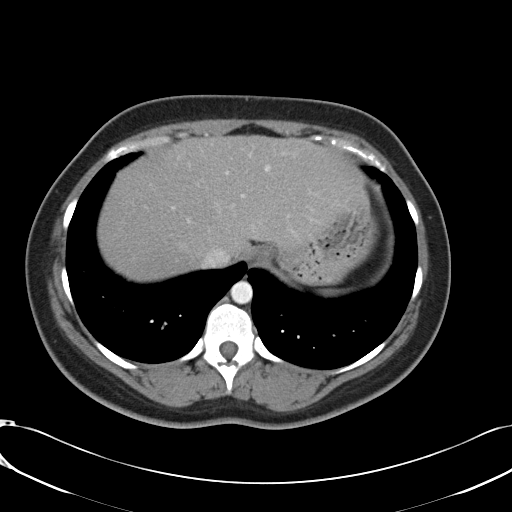
[im 80/84  soft-tissue]
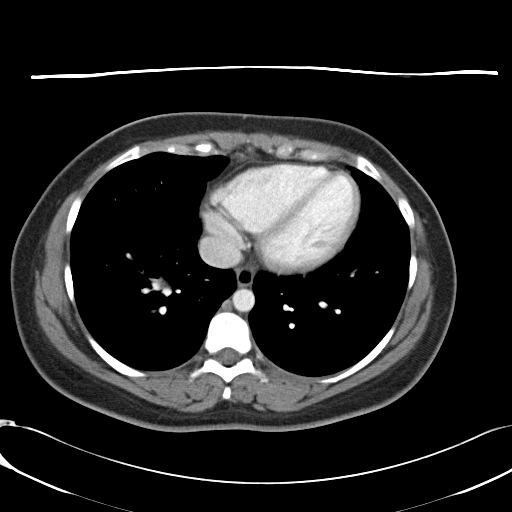

[Series 5: abd/pelv with 2.0 spo cor st · coronal · 0.82mm/px · 3 of 97 slices shown]
[im 33/97  soft-tissue]
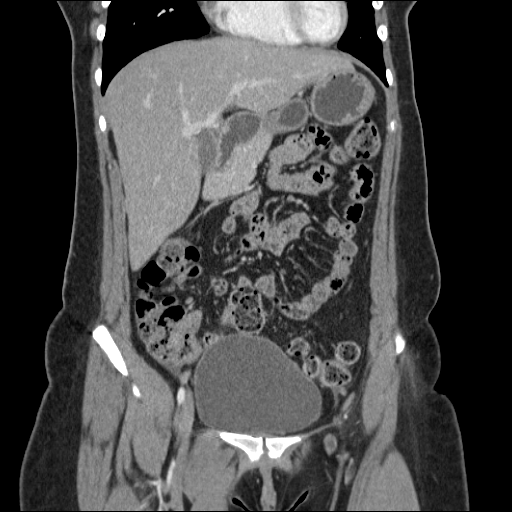
[im 43/97  soft-tissue]
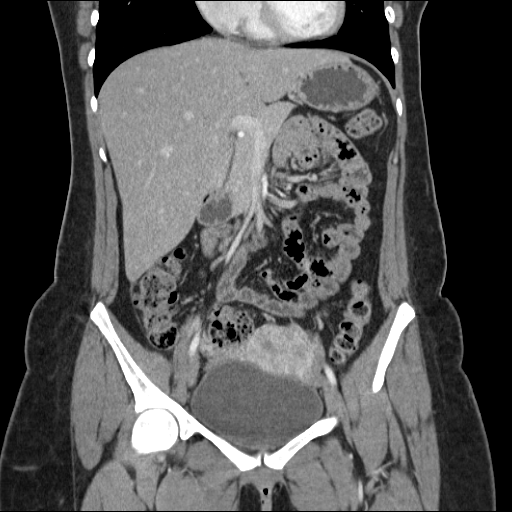
[im 54/97  soft-tissue]
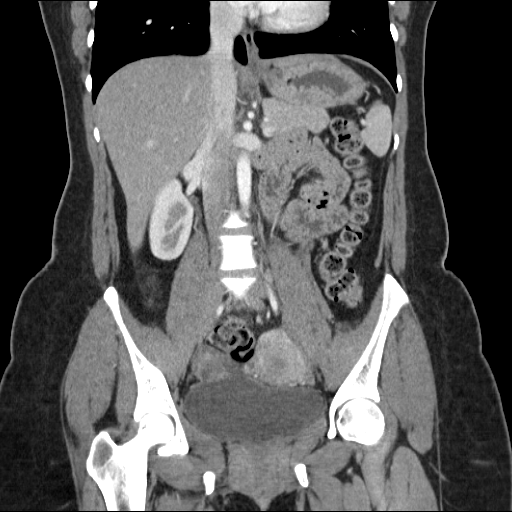

[16 of 46 positions shown; findings below may reference images not displayed]

FINDINGS: The lung bases are clear.

There is diffuse fatty infiltration of the liver.  No focal hepatic
lesions or intrahepatic biliary dilatation.  The gallbladder
appears normal.  The spleen is normal in size.  The pancreas is
unremarkable except that the patient has an anatomic variation of a
annular pancreas which surrounds the duodenum.  The adrenal glands
and kidneys demonstrate no significant abnormalities.

The stomach, duodenum, small bowel and colon are unremarkable.  No
mesenteric or retroperitoneal masses or adenopathy.  There are a
few small scattered nodes.  The aorta is normal in caliber.  The
major branch vessels are normal.

The bony structures are unremarkable.
IMPRESSION: 1.  No acute abdominal findings, mass lesions or adenopathy.
2.  Diffuse fatty infiltration of the liver.
3.  Anatomic variation of an annular pancreas.

CT PELVIS
FINDINGS: The rectum, sigmoid colon and visualized small bowel
loops are unremarkable.  The appendix is visualized and is normal.
Small pericecal and mesenteric lymph nodes are noted.

The endometrium is slightly prominent.  This is likely due to
secretory phase of ovulation.  There is a small, slightly complex,
associated with the right ovary which is slightly enlarged compared
to the left ovary.  No pelvic mass, adenopathy or free pelvic fluid
collection.  The bladder appears normal.
IMPRESSION: 1.  Slightly complex 2 cm cyst associated with the right ovary.
2.  Slightly prominent endometrium is likely due to secretory phase
of ovulation.

3.  The appendix is visualized and is normal.
4.  No pelvic mass, adenopathy or free pelvic fluid collection.

## 2010-05-13 IMAGING — US US PELVIS COMPLETE MODIFY
1 series · 13 of 25 positions shown · non-contrast
Comparison: None

CLINICAL DATA: Pelvic pain.  Rule out torsion.  LMP 08/24/2008

TRANSABDOMINAL AND TRANSVAGINAL ULTRASOUND OF PELVIS
DOPPLER ULTRASOUND OF OVARIES
TECHNIQUE: Both transabdominal and transvaginal ultrasound
examinations of the pelvis were performed including evaluation of
the uterus, ovaries, adnexal regions, and pelvic cul-de-sac. Color
and duplex Doppler ultrasound was utilized to evaluate blood flow
to the ovaries.

[Series 1: us pelvis complete modify · 0.30mm/px · 13 of 69 slices shown]
[im 1/69]
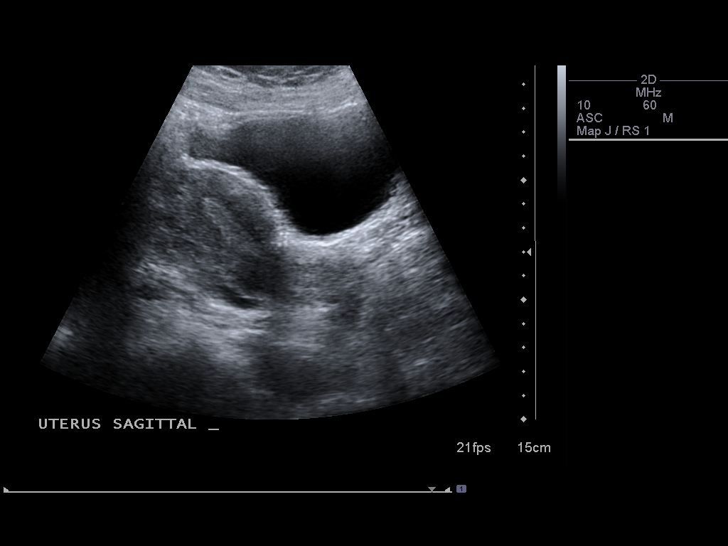
[im 6/69]
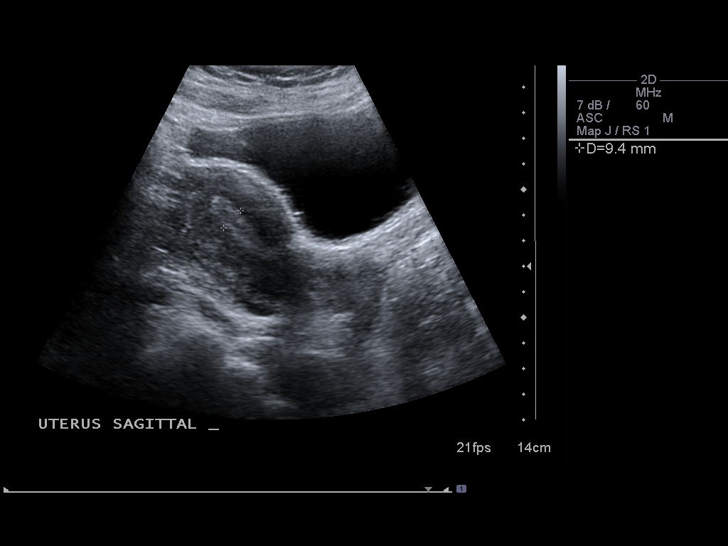
[im 12/69]
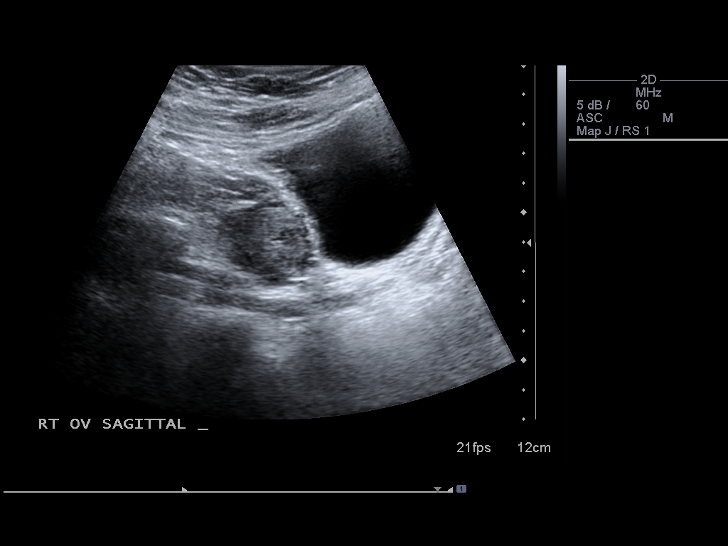
[im 18/69]
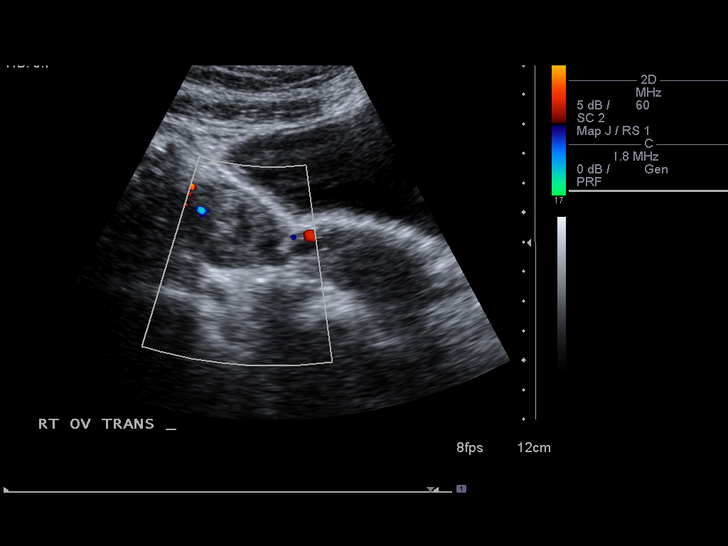
[im 23/69]
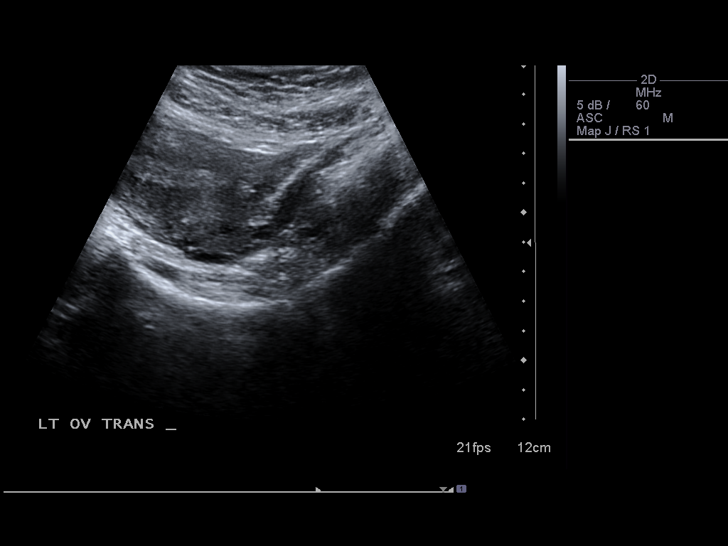
[im 29/69]
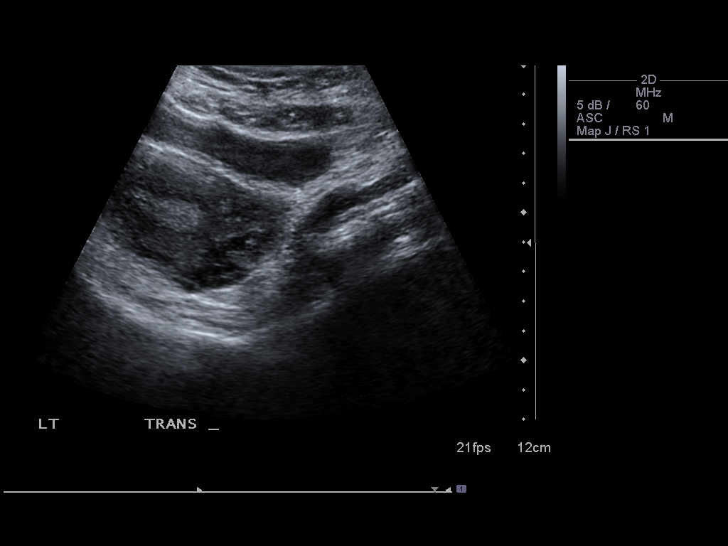
[im 35/69]
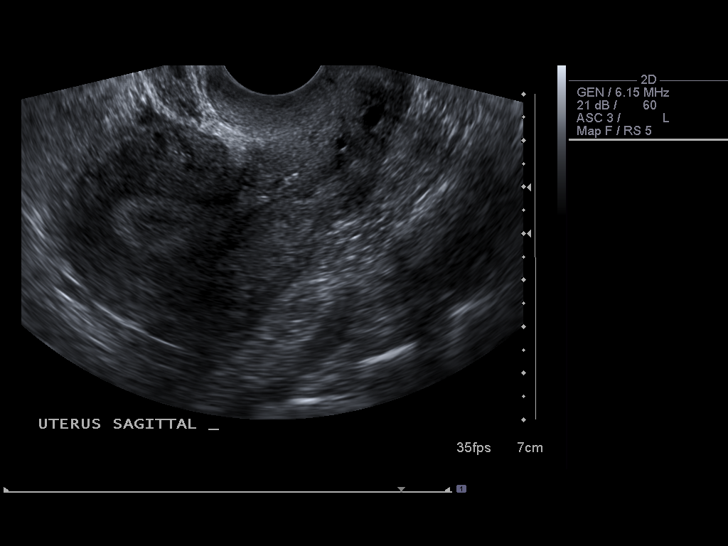
[im 40/69]
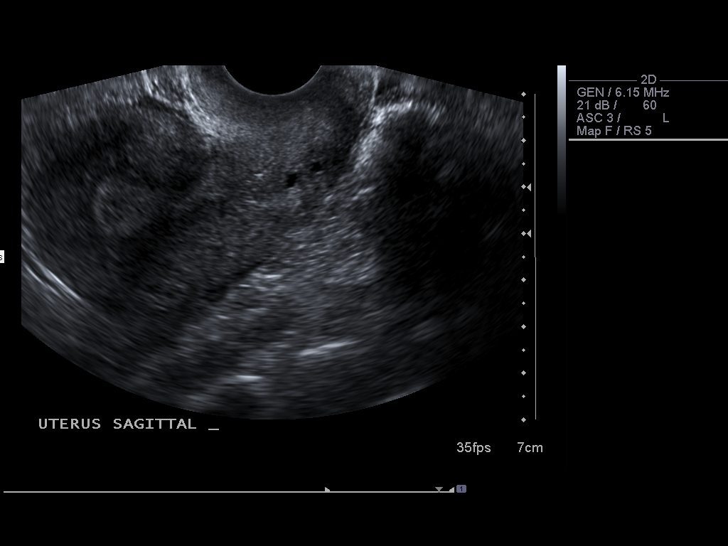
[im 46/69]
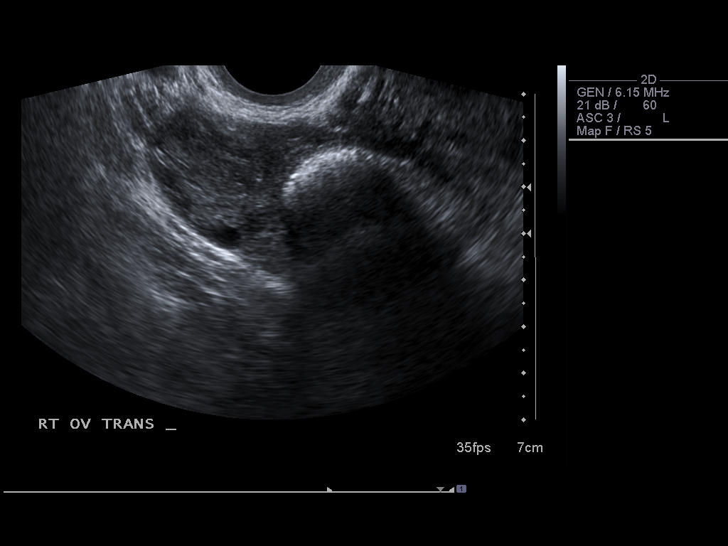
[im 52/69]
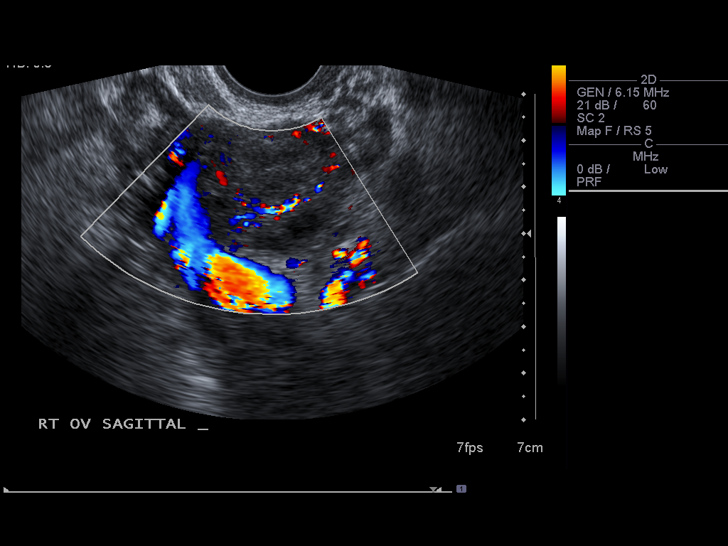
[im 57/69]
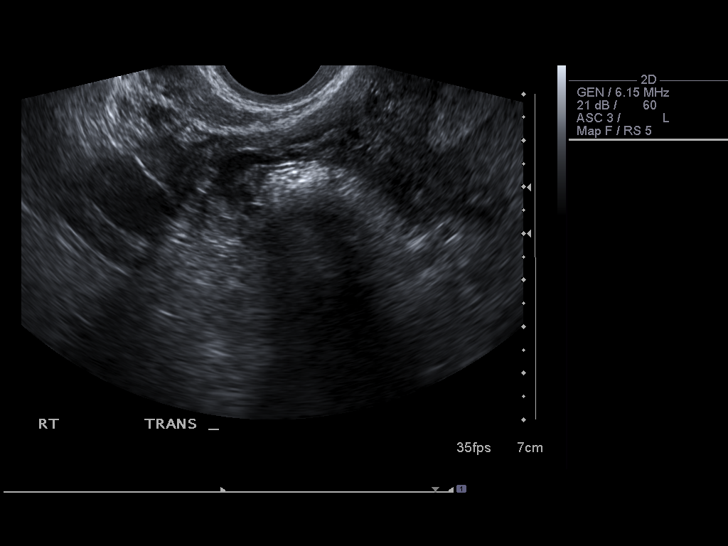
[im 63/69]
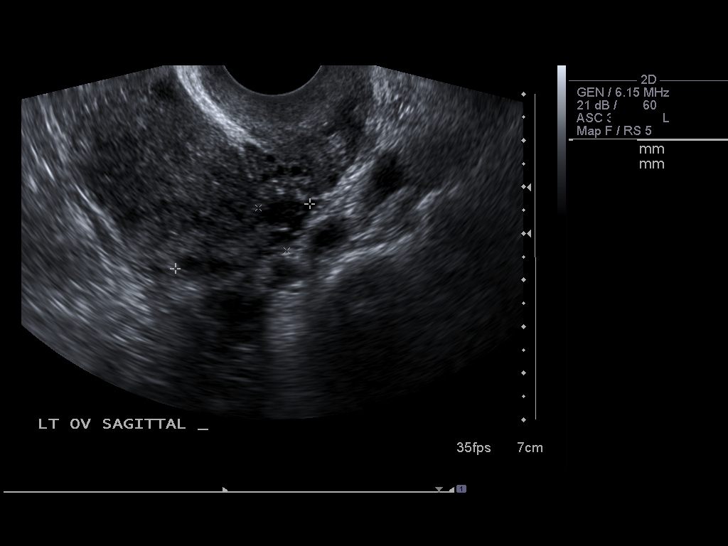
[im 69/69]
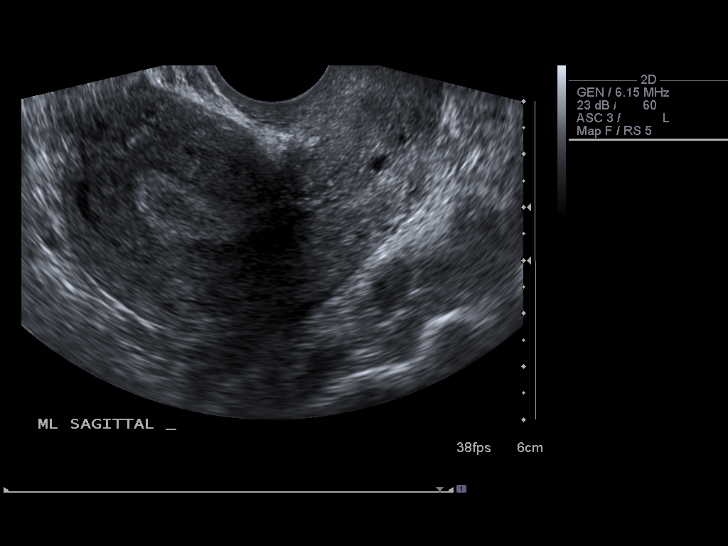

[13 of 25 positions shown; findings below may reference images not displayed]

FINDINGS: Uterus: The uterus has a sagittal length of 9.2 cm, an AP width of
3.9 cm and a transverse width of 5.0 cm.  A homogeneous uterine
myometrium is seen.

Endometrium:  The endometrial stripe shows a late tri- layered
appearance with an AP width of 1.4 cm.  This would correlate with a
late periovulatory, early secretory phase endometrial stripe.  No
areas of focal thickening or inhomogeneity are apparent.

Right Ovary:  Measures 2.9 x 3.8 x 2.6 cm and contains a small
complex cystic area measuring 2.4 x 1.6 by 1.8 cm.  This has some
diffuse low level echoes and would correlate with a corpus luteum
cyst.  Peripheral flow is noted around this area with no central
flow as expected with this functional lesion. Normal color Doppler
evaluation is noted associated with the right ovary.  Both venous
and arterial flow is noted within the ovarian stroma.

Left Ovary:  Normal appearance measuring 3.2 x 1.1 x 1.0 cm.
Normal arterial and venous flow is identified on this side.

Other Findings:  No pelvic fluid or separate adnexal masses are
seen.
IMPRESSION: Normal late periovulatory, early secretory phase pelvic ultrasound
with no sonographic stigmata suggestive of torsion.

## 2010-06-01 ENCOUNTER — Encounter: Payer: Self-pay | Admitting: Family Medicine

## 2010-06-01 ENCOUNTER — Ambulatory Visit
Admission: RE | Admit: 2010-06-01 | Discharge: 2010-06-01 | Payer: Self-pay | Source: Home / Self Care | Attending: Family Medicine | Admitting: Family Medicine

## 2010-06-01 DIAGNOSIS — L089 Local infection of the skin and subcutaneous tissue, unspecified: Secondary | ICD-10-CM | POA: Insufficient documentation

## 2010-06-03 ENCOUNTER — Ambulatory Visit: Admission: RE | Admit: 2010-06-03 | Discharge: 2010-06-03 | Payer: Self-pay | Source: Home / Self Care

## 2010-06-03 ENCOUNTER — Encounter: Payer: Self-pay | Admitting: Family Medicine

## 2010-06-03 DIAGNOSIS — R5383 Other fatigue: Secondary | ICD-10-CM

## 2010-06-03 DIAGNOSIS — R5381 Other malaise: Secondary | ICD-10-CM | POA: Insufficient documentation

## 2010-06-03 LAB — CONVERTED CEMR LAB
Hemoglobin: 13.2 g/dL (ref 12.0–15.0)
MCHC: 33 g/dL (ref 30.0–36.0)
MCV: 94.6 fL (ref 78.0–100.0)
RBC: 4.23 M/uL (ref 3.87–5.11)
TSH: 1.992 microintl units/mL (ref 0.350–4.500)
WBC: 8.6 10*3/uL (ref 4.0–10.5)

## 2010-06-04 ENCOUNTER — Telehealth: Payer: Self-pay | Admitting: Family Medicine

## 2010-06-14 ENCOUNTER — Encounter: Payer: Self-pay | Admitting: *Deleted

## 2010-06-15 ENCOUNTER — Encounter: Payer: Self-pay | Admitting: Family Medicine

## 2010-06-18 ENCOUNTER — Ambulatory Visit: Admission: RE | Admit: 2010-06-18 | Discharge: 2010-06-18 | Payer: Self-pay | Source: Home / Self Care

## 2010-06-18 DIAGNOSIS — S025XXA Fracture of tooth (traumatic), initial encounter for closed fracture: Secondary | ICD-10-CM | POA: Insufficient documentation

## 2010-06-18 DIAGNOSIS — K5900 Constipation, unspecified: Secondary | ICD-10-CM | POA: Insufficient documentation

## 2010-06-23 NOTE — Progress Notes (Signed)
Summary: regarding lab test  Phone Note Outgoing Call   Call placed by: Lequita Asal  MD,  June 03, 2009 12:47 PM Summary of Call: please inform that urine and swab test did not indicate infection. await result of culture.  Initial call taken by: Lequita Asal  MD,  June 03, 2009 12:49 PM

## 2010-06-23 NOTE — Letter (Signed)
 Summary: Generic Letter  [facility]  [address]   [place], [place] [address]   Phone: [phone]  Fax: [phone]    01/07/2009  [name] [address] [place], [place]  [address]  To whom it may concern,  Mrs Crus is a patient of mine.  Please excuse her for any absences she may have incurred from 01/05/09-01/07/09.   Feel free to contact me with any questions you may have.       Sincerely,    Debby Petties MD

## 2010-06-23 NOTE — Progress Notes (Signed)
  Phone Note Outgoing Call Call back at [place] - [phone]   Call placed by: Debby Petties MD,  January 07, 2009 4:05 PM Summary of Call: Case discussed with Dr. Unice, Neurosurgery.  Recommends that we refer to him for full workup.  8mm is not that large and this is not an emergency but we will expect it to grow as the pregnancy progresses.  He also recommends repeat MRI after delivery to re-eval size. I will set up referral to them. Initial call taken by: Debby Petties MD,  January 07, 2009 4:07 PM     Appended Document: Orders Update    Clinical Lists Changes  Orders: Added new Referral order of Neurosurgeon Referral (Neurosurgeon) - Signed      Appended Document:  Dr. Petties informed me of results of mri and nsg referral.  called pt and gave mri results.  told her that we would help set up nsg appt.  She expressed understanding

## 2010-06-23 NOTE — Assessment & Plan Note (Signed)
Summary: OB F/U [facility]   Vital Signs:  Patient profile:   30 year old female Weight:      164.4 pounds Temp:     98.2 degrees F oral Pulse rate:   88 / minute BP sitting:   126 / 76  (left arm) Cuff size:   regular  Vitals Entered By: Loralee Pacas (June 11, 2009 10:46 AM)  Serial Vital Signs/Assessments:  Time      Position  BP       Pulse  Resp  Temp     By                     108/55                         Asher Muir MD  CC: ob 32 1/7 weeks Comments dialation and contractions went to [facility] ed   Primary Care Provider:  Asher Muir MD  CC:  ob 32 1/7 weeks.  History of Present Illness:  swelling in hands and feet.  hard to get ring off  went to MAU last week for contractions.  diagnosed with false labor.  also found glycosuria.  (glucose 250 in urine)  seen in clinic last week.  no uti, but glycosuria  still having contractions when she walks alot.    Habits & Providers  Alcohol-Tobacco-Diet     Cigarette Packs/Day: n/a  Current Medications (verified): 1)  Miralax   Powd (Polyethylene Glycol 3350) .Marland Kitchen.. 17g Daily Dissolved in [employer].  Disp Q.s. 3 Days 2)  Prenavite Protein Coated 28-0.8 Mg Tabs (Prenatal Vit-Fe Fumarate-Fa) .Marland Kitchen.. 1 Tab By Mouth Daily 3)  Zofran 4 Mg Tabs (Ondansetron Hcl) .... One Tab By Mouth Q8h As Needed For Nausea 4)  Acetaminophen 500 Mg Caps (Acetaminophen) .Marland Kitchen.. 1-2 Tabs Every 6 Hours As Needed Pain  Allergies: No Known Drug Allergies  Physical Exam  General:  Well-developed,well-nourished,in no acute distress; alert,appropriate and cooperative throughout examination Abdomen:  gravid Additional Exam:  vital signs reviewed  repeat bp consistent with past bps   Impression & Recommendations:  Problem # 1:  SUPERVISION OF OTHER NORMAL PREGNANCY (ICD-V22.1)  repeat bp fine.  probably does have some glucose intolerance even though she passed 3-hour GTT X 2.  Discussed with Dr. Sw[place], will defer repeat GTT for now.  advised good  diet.  Her fundal height did increase by 4 cm since last visit.  will recheck u/s.    Orders: Ultrasound (Ultrasound) Other OB visit- [facility] ([facility])  Complete Medication List: 1)  Miralax Powd (Polyethylene glycol 3350) .Marland Kitchen.. 17g daily dissolved in 8oz water.  disp q.s. 3 days 2)  Prenavite Protein Coated 28-0.8 Mg Tabs (Prenatal vit-fe fumarate-fa) .Marland Kitchen.. 1 tab by mouth daily 3)  Zofran 4 Mg Tabs (Ondansetron hcl) .... One tab by mouth q8h as needed for nausea 4)  Acetaminophen 500 Mg Caps (Acetaminophen) .Marland Kitchen.. 1-2 tabs every 6 hours as needed pain  Patient Instructions: 1)  It was nice to see you today. 2)  We will help set you up with an ultrasound.  3)  If you feel like your water has broken, you experience any bleeding, you are having contractions every 5 minutes or less, or you feel like the baby isn't moving like normal, please go to the MAU at [facility] hospital for evaluation.  4)  Try to avoid sugary foods (sugar, syrup, honey, sodas).  Stick to the same diet you had  when you had gestational diabetes with your other pregnancy. 5)  Please schedule a follow-up appointment in 2 weeks.    Flowsheet View for Follow-up Visit    Estimated weeks of       gestation:     32 1/7    Weight:     164.4    Blood pressure:   126 / 76    Hx headache?     No    Nausea/vomiting?   nausea    Edema?     TrLE    Bleeding?     no    Leakage/discharge?   no    Fetal activity:       yes    Labor symptoms?   few ctx    Fundal height:      34    FHR:       150s    Smoking PPD:   n/a    Comment:     fundal height rechecked by Dr Swaziland    Next visit:     2 wk    Resident:     Lafonda Mosses    Preceptor:     [place]

## 2010-06-23 NOTE — Assessment & Plan Note (Signed)
Summary: postpartum,tcb   Vital Signs:  Patient profile:   30 year old female Height:      62 inches Weight:      147.5 pounds BMI:     27.08 Temp:     98 degrees F oral Pulse rate:   63 / minute BP sitting:   109 / 72  (left arm) Cuff size:   regular  Vitals Entered By: Gladstone Pih (September 03, 2009 9:02 AM) CC: Post partum and adenoma Is Patient Diabetic? No Pain Assessment Patient in pain? no        Primary Care Provider:  Asher Muir MD  CC:  Post partum and adenoma.  History of Present Illness: 1.  post partum check.  doing well.  breast feeding.  no depressive symtpoms.  no excessive bleeding.  baby is doing well.  had gestational diabetes at the end of the pregnancy.  need to check GTT today  2.  adenoma--seen during pregnancy by neurosugery.  was to have repeat mri and see him after  Habits & Providers  Alcohol-Tobacco-Diet     Tobacco Status: never  Allergies: No Known Drug Allergies  Physical Exam  General:  Well-developed,well-nourished,in no acute distress; alert,appropriate and cooperative throughout examination Genitalia:  Pelvic Exam:        External: normal female genitalia without lesions or masses        Vagina: normal without lesions or masses        Cervix: normal without lesions or masses        Adnexa: normal bimanual exam without masses or fullness        Uterus: normal by palpation        Pap smear: not performed Additional Exam:  vital signs reviewed    Impression & Recommendations:  Problem # 1:  GESTATIONAL DIABETES (ICD-648.80) Assessment Unchanged check 2 hour GTT today Orders: Miscellaneous Lab Charge-[facility] ([id number])  Problem # 2:  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  needs repeat mri and prolactin.  advised to follow up with neurosurgery  Orders: TSH-[facility] ([id number]) Prolactin-[facility] [phone]) MRI (MRI)  Problem # 3:  POSTPARTUM EXAMINATION (ICD-V24.2) Assessment: New  doing well.   no depression.  breastfeeding well.    [id number]  Complete Medication List: 1)  Miralax Powd (Polyethylene glycol 3350) .Marland Kitchen.. 17g daily dissolved in 8oz water.  disp q.s. 3 days 2)  Prenavite Protein Coated 28-0.8 Mg Tabs (Prenatal vit-fe fumarate-fa) .Marland Kitchen.. 1 tab by mouth daily 3)  Zofran 4 Mg Tabs (Ondansetron hcl) .... One tab by mouth q8h as needed for nausea 4)  Acetaminophen 500 Mg Caps (Acetaminophen) .Marland Kitchen.. 1-2 tabs every 6 hours as needed pain 5)  Flexeril 5 Mg Tabs (Cyclobenzaprine hcl) .... Take one by mouth two times a day as needed headache  Patient Instructions: 1)  It was nice to see you today. 2)  I will call you with your test results. 3)  We will help you to set up an appointment with the neurosurgeon.  We will also set you up to get an MRI  Appended Document: postpartum,tcb appt made with vangaurd for 05.11.2011 at 945am.  pt has been made aware of this and agreed.

## 2010-06-23 NOTE — Progress Notes (Signed)
Summary: triage  Phone Note Call from Patient Call back at Home Phone [phone]   Caller: Patient Summary of Call: Pt checking on urine culture.  She is [redacted] weeks pregnant and her back is hurting her and having contractions since Sat.  Initial call taken by: Clydell Hakim,  June 05, 2009 11:20 AM  Follow-up for Phone Call        Sat had brown D/c, checked on Tues at [facility] finger tip, has leaked fluid with last preg, yesterday had clear liquid come out in Pm larger amount than other times, then  rested  in bed,  contraction woke her up last night and cont to have contractions today, has not timed them, told to go to [facility] to be checked, pt agreed with plan Follow-up by: Gladstone Pih,  June 05, 2009 11:29 AM

## 2010-06-23 NOTE — Progress Notes (Signed)
Summary: triage  Phone Note Call from Patient Call back at Home Phone [phone]   Caller: Patient Summary of Call: having stomach pain and had brown mucus discharge last week.  needs to talk to nurse Initial call taken by: De Nurse,  June 02, 2009 2:37 PM  Follow-up for Phone Call        [redacted] weeks pregnant. had brown mucous discharge in ams a few times last week. some pink as well. having contractions & stomach pain. soft bms each day. thisis her 3rd child. states the area where she voids is sensative. denies pain. appt tomorrow with Dr. Lanier Prude to see about above complaints Follow-up by: Golden Circle RN,  June 02, 2009 2:43 PM

## 2010-06-23 NOTE — Assessment & Plan Note (Signed)
Summary: f/u,df   Vital Signs:  Patient profile:   30 year old female Weight:      149.5 pounds BMI:     27.44 Temp:     97.7 degrees F oral Pulse rate:   63 / minute Pulse rhythm:   regular BP sitting:   114 / 77  (right arm) Cuff size:   regular  Vitals Entered By: Loralee Pacas CMA (September 18, 2009 11:33 AM) CC: RUQ pain Comments pt went to [facility] thursday for right sided flank pain x 1 week.  noticed more when she eats greasy and spicy foods and drinks milk. was told that she may have to have an [place]   Primary Care Provider:  Asher Muir MD  CC:  RUQ pain.  History of Present Illness: 30 YO who is 7 weeks post partum complains of:    1.  RUQ pain that radiates to back past 10 days.  diarrhea X 3 times.  pain worse after meals, especially spicy foods.  took some motrin, which helped.  nauseated, but no vomitting.  no fever or jaundice.  went to urgent care.  had labs that showed very slight elevation of alk phos (120) and ALT (37).   did not have ultrasound; told to follow up with PCP.  has had similar symptoms a few times in the past.  Did have an abdominal ultrasound in Sept 2010 that did not show any gallstones or GB wall thickening.    2.  overweight-- would like to lose weight.  BMI 27.  had gestational diabetes.  husband recently diagnosed with glucose intolerance.  it is difficult to find time to exercise with 3 young kids.  3.  fatty liver--reviewed echart records for past imaging and fatty liver seen on CT within the last year.    Current Medications (verified): 1)  Miralax   Powd (Polyethylene Glycol 3350) .Marland Kitchen.. 17g Daily Dissolved in [employer].  Disp Q.s. 3 Days 2)  Prenavite Protein Coated 28-0.8 Mg Tabs (Prenatal Vit-Fe Fumarate-Fa) .Marland Kitchen.. 1 Tab By Mouth Daily 3)  Zofran 4 Mg Tabs (Ondansetron Hcl) .... 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 GI:  Complains of abdominal pain, diarrhea, indigestion, and nausea; denies bloody stools, vomiting, and yellowish skin color. Endo:  Denies cold intolerance, excessive hunger, excessive thirst, excessive urination, and polyuria; 3 pound wt gain since last visit.  Physical Exam  General:  Well-developed,well-nourished,in no acute distress; alert,appropriate and cooperative throughout examination Abdomen:  ttp in RUQ and epigastrium.  equivocal murphy's sign.  soft, no masses, no guarding, no rigidity, and no rebound tenderness.  hypoactive bowel sounds   Impression & Recommendations:  Problem # 1:  RUQ PAIN (ICD-789.01) Assessment New history is suspicious for cholelithiasis.  check abd ultrasound.  labs show only mild elevation in ALT and AP.  WBC normal.   Orders: Ultrasound (Ultrasound) [facility]-  Est  Level 4 (16109)  Problem # 2:  OVERWEIGHT (ICD-278.02) Assessment: Unchanged  encouraged exercise and talking with Dr Gerilyn Pilgrim for nutrition counseling.    Orders: [facility]- Est  Level 4 (60454)  Problem # 3:  FATTY LIVER DISEASE (ICD-571.8) Assessment: Unchanged  explained long term consequences of fatty liver.  wt loss, exercise, healthy eating are the treatments.    Orders: [facility]- Est  Level 4 (09811)  Complete Medication List: 1)  Miralax Powd (Polyethylene glycol 3350) .Marland Kitchen.. 17g daily dissolved in 8oz water.  disp q.s. 3 days 2)  Prenavite Protein Coated 28-0.8 Mg Tabs (Prenatal vit-fe  fumarate-fa) .Marland Kitchen.. 1 tab by mouth daily 3)  Zofran 4 Mg Tabs (Ondansetron hcl) .... One tab by mouth q8h as needed for nausea  Patient Instructions: 1)  It was nice to see you today. 2)  We will set you up for an ultrasound.  I will call you with results. 3)  I think that it is great you plan on eating healthy and losing weight. 4)  If you want a nutrition appointment.  Schedule an appointment with Dr Gerilyn Pilgrim at the front desk.

## 2010-06-23 NOTE — Assessment & Plan Note (Signed)
Summary: ob check of vaginal d/c & urine/[facility]   Vital Signs:  Patient profile:   30 year old female Weight:      161.9 pounds Temp:     98 degrees F oral Pulse rate:   93 / minute Pulse rhythm:   regular BP sitting:   106 / 61  (right arm) Cuff size:   regular  Vitals Entered By: Loralee Pacas CMA (June 03, 2009 11:23 AM) CC: vaginal discharge x3 days Comments pt stated that the d/c has been going on for 3 days started out brown and it's yellow now, contractions lower stomach and she stated that her labia is pertruding. stayed in bed all day yesterday felt like the baby's head was causing the pressure.   Primary Care Provider:  Asher Muir MD  CC:  vaginal discharge x3 days.  History of Present Illness: 30 y/o G3P2002 at 31 weeks c/o vaginal discharge and increased contractions in last 3 days. states contractions every 20-30 minutes. she has been trying to drink more fluid and relax around the house, which makes them better. feels like baby pressing downward. no leakage of fluid, no bleeding. good fetal movement.   Habits & Providers  Alcohol-Tobacco-Diet     Cigarette Packs/Day: n/a  Allergies: No Known Drug Allergies  Physical Exam  General:  well-groomed, gravid.  vitals reviewed.  Abdomen:  gravid, +BS. NT.  Genitalia:  Normal introitus for age, no external lesions, +white milky vaginal discharge, mucosa pink and moist, no vaginal or cervical lesions  able to insert fingertip in external os, but internal os is closed.  unable to tell orientation of fetus.    Impression & Recommendations:  Problem # 1:  VAGINAL DISCHARGE (ICD-623.5) Assessment Deteriorated wet prep without significant pathogens. likely physiologic discharge.  U/A with +LE, but without history truly significant for UTI. will observe, and not treat for now. send urine for culture.   [id number]  Problem # 2:  GLYCOSURIA (ICD-791.5) Assessment: New normal 3 hr  GTT. recommend f/u by PCP.   Complete Medication List: 1)  Miralax Powd (Polyethylene glycol 3350) .Marland Kitchen.. 17g daily dissolved in 8oz water.  disp q.s. 3 days 2)  Prenavite Protein Coated 28-0.8 Mg Tabs (Prenatal vit-fe fumarate-fa) .Marland Kitchen.. 1 tab by mouth daily 3)  Zofran 4 Mg Tabs (Ondansetron hcl) .... One tab by mouth q8h as needed for nausea 4)  Acetaminophen 500 Mg Caps (Acetaminophen) .Marland Kitchen.. 1-2 tabs every 6 hours as needed pain  Other Orders: Urinalysis-[facility] (00000) Urine Culture-[facility] ([id number]) Other OB visit- [facility])    Laboratory Results   Urine Tests  Date/Time Received: June 03, 2009 11:30 AM  Date/Time Reported: June 03, 2009 11:47 AM   Routine Urinalysis   Color: yellow Appearance: Clear Glucose: 100   (Normal Range: Negative) Bilirubin: negative   (Normal Range: Negative) Ketone: negative   (Normal Range: Negative) Spec. Gravity: 1.015   (Normal Range: 1.003-1.035) Blood: negative   (Normal Range: Negative) pH: 7.0   (Normal Range: 5.0-8.0) Protein: negative   (Normal Range: Negative) Urobilinogen: 0.2   (Normal Range: 0-1) Nitrite: negative   (Normal Range: Negative) Leukocyte Esterace: moderate   (Normal Range: Negative)  Urine Microscopic WBC/HPF: 10-15 Bacteria/HPF: 2+ rods and cocci Epithelial/HPF: 5-10    Comments: ...............test performed by......Marland KitchenBonnie A. Swaziland, MLS (ASCP)cm  Date/Time Received: June 03, 2009 12:13 PM  Date/Time Reported: June 03, 2009 12:18 PM   [employer] Source: vaginal WBC/hpf: >20 Bacteria/hpf: 3+  Rods Clue cells/hpf:  none  Negative whiff Yeast/hpf: none Trichomonas/hpf: none Comments: ...........test performed by...........Marland KitchenTerese Door, CMA     Flowsheet View for Follow-up Visit    Estimated weeks of       gestation:     31 0/7    Weight:     161.9    Blood pressure:   106 / 61    Urine Protein:     negative    Urine Glucose:   100    Urine Nitrite:     negative    Headache:      No    Nausea/vomiting:   nausea    Edema:     0    Vaginal bleeding:   no    Vaginal discharge:   d/c    Fundal height:      30    FHR:       140s    Fetal activity:     yes    Labor symptoms:   few ctx    Cx Dilation:     FT    Cx Effacement:   0%    Cx Station:     high    Taking prenatal vits?   Y    Smoking:     n/a    Next visit:     1 wk    Resident:     Lanier Prude    Preceptor:     [place]

## 2010-06-23 NOTE — Progress Notes (Signed)
 Summary: phn msg  Phone Note Call from Patient Call back at [facility] Phone [phone]   Caller: Patient Summary of Call: had MRI 2 days ago.  eyes watering and sensitive to light.  She is very weak.  Has to work this weekend and worried it will not be safe for her to work. Initial call taken by: Madelin Daring,  January 09, 2009 1:37 PM  Follow-up for Phone Call        Migraine headaches were in the differential.  It is unlikely that an 8mm microadenoma is causing such pain.  While she is waiting for the neurosurg referral she can use Sumatriptan 50mg  taken once, and then again in 2 h if no improvement in headaches.  No more than 200mg  per day.  I will send it to her pharmarcy.  She should also take the tylenol  as previously prescribed.  Follow up with Dr. Edrick as needed. Follow-up by: Debby Petties MD,  January 09, 2009 10:07 PM    New/Updated Medications: SUMATRIPTAN SUCCINATE 50 MG TABS (SUMATRIPTAN SUCCINATE) One tab by mouth x1 at first sign of headache, then again in 2 hours if needed, no more than 200mg /day. Prescriptions: SUMATRIPTAN SUCCINATE 50 MG TABS (SUMATRIPTAN SUCCINATE) One tab by mouth x1 at first sign of headache, then again in 2 hours if needed, no more than 200mg /day.  #6 x 1   Entered and Authorized by:   Debby Petties MD   Signed by:   Debby Petties MD on 01/09/2009   Method used:   Electronically to        [id number]* (retail)       [address] [place], [place]  [address]       Ph: [phone]       Fax: [phone]   RxID:   [id number]

## 2010-06-23 NOTE — Assessment & Plan Note (Signed)
Summary: ob f/u per Dr. Zerrick Hanssen/bmc   Vital Signs:  Patient profile:   30 year old female Weight:      162.6 pounds Temp:     98 degrees F oral Pulse rate:   80 / minute Pulse rhythm:   regular BP sitting:   120 / 77  (left arm) Cuff size:   regular  Vitals Entered By: Loralee Pacas CMA (June 19, 2009 8:49 AM)  Primary Care Provider:  Asher Muir MD  CC:  OB 33 2/7 weeks.  History of Present Illness: 1.  glucosuria--having a repeat 3-hour gtt today due to glucosuria  2.  fatigue--generally feeling tired and lots of pelvic pressure  Allergies: No Known Drug Allergies   Impression & Recommendations:  Problem # 1:  GLYCOSURIA (ICD-791.5) Assessment Unchanged discussed with Dr [place] over the past week and decided that we will repeat 3 hour GTT.  If she fails, will have to go to high risk.  pt understands Orders: Glucose 3 hr-[facility] (82951/82952) Other OB visit- [facility] (OBCK)  Problem # 2:  Hx of PREV C/S DELIV DELIV W/WO MENTION ANTPRTM COND (GEX-528.41)  has c-section consult set up  Orders: Other OB visit- [facility] (OBCK)  Problem # 3:  SUPERVISION OF OTHER NORMAL PREGNANCY (ICD-V22.1) Assessment: Unchanged  plan as above.  pt would like to have BTL.  Plans to breast feed  Orders: Other OB visit- [facility] (OBCK)  Complete Medication List: 1)  Miralax Powd (Polyethylene glycol 3350) .Marland Kitchen.. 17g daily dissolved in 8oz water.  disp q.s. 3 days 2)  Prenavite Protein Coated 28-0.8 Mg Tabs (Prenatal vit-fe fumarate-fa) .Marland Kitchen.. 1 tab by mouth daily 3)  Zofran 4 Mg Tabs (Ondansetron hcl) .... One tab by mouth q8h as needed for nausea 4)  Acetaminophen 500 Mg Caps (Acetaminophen) .Marland Kitchen.. 1-2 tabs every 6 hours as needed pain  Patient Instructions: 1)  It was nice to see you today. 2)  I will call you tomorrow or Saturday with your sugar test results. 3)  Please schedule a follow-up appointment in 1 week.  4)  If you feel like your water has broken, you experience any bleeding,  you are having contractions every 5 minutes or less, or you feel like the baby isn't moving like normal, please go to the MAU at [facility] hospital for evaluation.     Flowsheet View for Follow-up Visit    Estimated weeks of       gestation:     33 2/7    Weight:     162.6    Blood pressure:   120 / 77    Hx headache?     few    Nausea/vomiting?   nausea    Edema?     0    Bleeding?     no    Leakage/discharge?   d/c    Fetal activity:       yes    Labor symptoms?   few ctx    Fundal height:      34    FHR:       140s    Next visit:     1 wk    Resident:     Lafonda Mosses    Preceptor:     Leveda Anna

## 2010-06-23 NOTE — Miscellaneous (Signed)
Summary: Orders Update  Clinical Lists Changes  Orders: Added new Test order of MRI with & without Contrast (MRI w&w/o Contrast) - Signed 

## 2010-06-23 NOTE — Miscellaneous (Signed)
Summary: re: appointment with neurosurgeon  Clinical Lists Changes  Patient calls to ask  for the phone number to the surgeon she is suppose to see tomorrow. Apparently an appointment was scheduled with [facility]  for   Dr. Venetia Maxon for tomorrow at 9:45 AM. This was verified thru their office.  Was told the appointment was scheduled from  our office.  Patient  states she plans to cancel the appointment .  She does not feel it is necessary that she go. Encouraged patient to keep appointment but she declines.  Will forward this message to Dr. Lafonda Mosses. There is no order for referral . Theresia Lo RN  Sep 30, 2009 2:50 PM

## 2010-06-23 NOTE — Assessment & Plan Note (Signed)
 Summary: HA, nausea,dizzy. pregnant/[place]   Vital Signs:  Patient profile:   30 year old female Height:      62 inches Weight:      151 pounds BMI:     27.72 Temp:     98.1 degrees F Pulse rate:   78 / minute BP sitting:   121 / 79  (left arm)  Vitals Entered By: Avelina Sharps RN (January 07, 2009 9:15 AM) CC: pregnant, headache and dizziness for 4 days Is Patient Diabetic? No Pain Assessment Patient in pain? yes     Location: head Intensity: 10 Type: aching   Primary Care Provider:  DEVERE FRUITS MD  CC:  pregnant and headache and dizziness for 4 days.  History of Present Illness: 28 G3P2002 at 10 wks here for c/o HA/nausea/dizziness.  Also with Hx pituitary microadenoma.  Present for a week now, worse at night and when waking up in the morning, pressure behind eyes, sometimes pulsatile, sometimes constant, mild photo/phonophobia.  nausea so bad she has trouble eating but can keep down fluids.  Numb on right cheek and forehead.  otherwise no numbness, tingling, weakness.  Some new diplopia.  No fevers/chills, runny nose, cough, SOB, wheeze, diarrhea.  Allergies: No Known Drug Allergies  Past History:  Past Surgical History: Last updated: 12/26/2008 Colposcopy Imp CIN 1, no BX, Preg - 02/22/2003, MRI 8mm pituitary microadenoma - 01/22/2004, septoplasty and trubinate reduction - 07/23/2002 c/s for transverse lie in 2008  Family History: Last updated: 12/26/2008 brother, sister--Asthma hld--mother PGF--DM  NO CAD, NO Cancers, NO HTN  Social History: Last updated: 12/26/2008 lives with husband, mother-in-law, and 2 daughters.  occ alcohol until pregnancy.  never smoked.  no drugs.   From [place], [place]; works at [employer] part-time.   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  GDM Hyperprolactinemia, Pituitary microadenoma 6mm 2003-->reduced in size to 3 mm in 2009  Review  of Systems       See HPI  Physical Exam  General:  Well-developed,well-nourished,in no acute distress; alert,appropriate and cooperative throughout examination Head:  Normocephalic and atraumatic without obvious abnormalities. No apparent alopecia or balding.  No pain/tenderness to sinus percussion. Eyes:  No corneal or conjunctival inflammation noted. EOMI. Perrla. Ears:  External ear exam shows no significant lesions or deformities.  Otoscopic examination reveals clear canals, tympanic membranes are intact bilaterally without bulging, retraction, inflammation or discharge. Hearing is grossly normal bilaterally. Nose:  External nasal examination shows no deformity or inflammation. Nasal mucosa are pink and moist without lesions or exudates. Mouth:  Oral mucosa and oropharynx without lesions or exudates.  Teeth in good repair. Lungs:  Normal respiratory effort, chest expands symmetrically. Lungs are clear to auscultation, no crackles or wheezes. Heart:  Normal rate and regular rhythm. S1 and S2 normal without gallop, murmur, click, rub or other extra sounds. Abdomen:  Soft, NT/ND, gravid, appropriate for dates., +BS Neurologic:  Numbness noted in V1 and V2 distribution, othe CN intact. Station and gait are normal. Plantar reflexes are down-going bilaterally. DTRs are symmetrical throughout. Other sensory, motor and coordinative functions appear intact.   Impression & Recommendations:  Problem # 1:  HEADACHE (ICD-784.0) Assessment New With Hx pituitary adenoma, new 5th nerve symptoms, diplopia, will need to re-MRI brain.  It is possible this is migrainous but with her history.  Will give tylenol  for pain.  No signs of sinus etiology.  Zofran  for  nausea.  Will fu MRI results.  If neg will treat as migraine.    Her updated medication list for this problem includes:    Acetaminophen  650 Mg Cr-tabs (Acetaminophen ) ..... One tab by mouth q8h as needed for headache.  Orders: MRI without Contrast  (MRI w/o Contrast) [facility]- Est  Level 4 (00785)  Complete Medication List: 1)  Miralax  Powd (Polyethylene glycol 3350 ) .SABRA.. 17g daily dissolved in 8oz water .  disp q.s. 3 days 2)  Prenavite Protein Coated 28-0.8 Mg Tabs (Prenatal vit-fe fumarate-fa) .SABRA.. 1 tab by mouth daily 3)  Zofran  4 Mg Tabs (Ondansetron  hcl) .... One tab by mouth q8h as needed for nausea 4)  Acetaminophen  650 Mg Cr-tabs (Acetaminophen ) .... One tab by mouth q8h as needed for headache.  Patient Instructions: 1)  Good to meet you today, 2)  With your symptoms we will need to MRI your brain to ensure no growth of the tumor. 3)  I will also give you some zofran  for your nausea and tylenol  for your pain. 4)  -Dr. ONEIDA. Prescriptions: ACETAMINOPHEN  650 MG CR-TABS (ACETAMINOPHEN ) One tab by mouth q8h as needed for headache.  #30 x 3   Entered and Authorized by:   Debby Petties MD   Signed by:   Debby Petties MD on 01/07/2009   Method used:   Electronically to        [facility] #[id number]* (retail)       [address] [place], [place]  [address]       Ph: [phone]       Fax: [phone]   RxID:   [id number] ZOFRAN  4 MG TABS (ONDANSETRON  HCL) One tab by mouth q8h as needed for nausea  #10 x 3   Entered and Authorized by:   Debby Petties MD   Signed by:   Debby Petties MD on 01/07/2009   Method used:   Electronically to        [facility] [address] #[id number]* (retail)       [address] [place], [place]  [address]       Ph: [phone]       Fax: [phone]   RxID:   [id number]    Flowsheet View for Follow-up Visit    Estimated weeks of       gestation:     10 0/7    Weight:     151    Blood pressure:   121 / 79   Appended Document: HA, nausea,dizzy. pregnant/[place] Last prolactin level: 27.2 (12/22/07), Pituitary tumor was 3mm on MRI at this time.  Appended Document: HA, nausea,dizzy. pregnant/[place] Prolactin 29.9 on 08/28/2008 (Not  pregnant)

## 2010-06-23 NOTE — Assessment & Plan Note (Signed)
Summary: headache for 3 days/ls   Vital Signs:  Patient profile:   30 year old female Weight:      163 pounds Pulse rate:   72 / minute BP sitting:   117 / 69  (right arm)  Vitals Entered By: Arlyss Repress CMA, (June 27, 2009 3:50 PM) CC: ob f/up and head aches x 3 days. tightness on top of head Is Patient Diabetic? No Pain Assessment Patient in pain? yes     Location: head Intensity: 5 Onset of pain  x 3 days   Primary Care Provider:  Asher Muir MD  CC:  ob f/up and head aches x 3 days. tightness on top of head.  History of Present Illness: CC: HA and abd pain.  [id number] about 34 [redacted] wks pregnant, to have rpt CS.  Previous pregnancy had migraines and not preeclampsia.  No migraines prior to pregnancy.  H/o pituitary microadenoma with hyperprolactinemia.  Told no change this pregnancy.   h/o contractions, seen at MAU and told was 3cm ext, 1cm int.  [place] 06/11/2009 with normal AFI.  No fluid loss recently or bleeding.  Still occasional contractions, less than since went to MAU.  To go to [facility] for f/u of late onset GDM. [reviewed MAU visit from 1/13]   Since MAU visit, very tired, HA x 3 days.  tightness top of head.  Also with eye pain and ear pain.  As well as facial pain.  Tylenol helps for about 2 hours then goes away.  Has used tylenol 2x/d for last 3 days, not before.  No fevers, congestion, ST.  No n/v/d.  Sees stars with HA.  No RUQ pain, improved swelling.  BP stable.  No smoking at home.  Takes about 1/2 cup coffee per day.  Was drinking sodas.  No chocolate.  Today had snack wrap from [employer] with small fries.    Habits & Providers  Alcohol-Tobacco-Diet     Tobacco Status: never  Current Medications (verified): 1)  Miralax   Powd (Polyethylene Glycol 3350) .Marland Kitchen.. 17g Daily Dissolved in [employer].  Disp Q.s. 3 Days 2)  Prenavite Protein Coated 28-0.8 Mg Tabs (Prenatal Vit-Fe Fumarate-Fa) .Marland Kitchen.. 1 Tab By Mouth Daily 3)  Zofran 4 Mg Tabs (Ondansetron Hcl) .... One Tab  By Mouth Q8h As Needed For Nausea 4)  Acetaminophen 500 Mg Caps (Acetaminophen) .Marland Kitchen.. 1-2 Tabs Every 6 Hours As Needed Pain 5)  Flexeril 5 Mg Tabs (Cyclobenzaprine Hcl) .... Take One By Mouth Two Times A Day As Needed Headache  Allergies (verified): No Known Drug Allergies  Physical Exam  General:  Well-developed,well-nourished,in no acute distress; alert,appropriate and cooperative throughout examination Head:  Normocephalic and atraumatic without obvious abnormalities. No apparent alopecia or balding.  No pain/tenderness to sinus percussion. Eyes:  No corneal or conjunctival inflammation noted. EOMI. Perrla.  Mouth:  Oral mucosa and oropharynx without lesions or exudates.  Teeth in good repair. Neurologic:  No cranial nerve deficits noted. Station and gait are normal.  Sensory, motor and coordinative functions appear intact.   Impression & Recommendations:  Problem # 1:  HEADACHE (ICD-784.0)  migrainesque vs TTH.  only has used tylenol for 3 days so doubt MOH.  Treat with flexeril temporarily to see if can provide some relief.  Sedation precautions given.  BP good today, no swelling, no RUQ pain.  discussed healthy lifestyle to prevent migraines.  Will need to closely monitor pituitary adenoma prolactinoma.  F/u next week if not improved.  keep appt  with [facility].  Her updated medication list for this problem includes:    Acetaminophen 500 Mg Caps (Acetaminophen) .Marland Kitchen... 1-2 tabs every 6 hours as needed pain  Orders: [facility]- Est Level  3 (16109)  Complete Medication List: 1)  Miralax Powd (Polyethylene glycol 3350) .Marland Kitchen.. 17g daily dissolved in 8oz water.  disp q.s. 3 days 2)  Prenavite Protein Coated 28-0.8 Mg Tabs (Prenatal vit-fe fumarate-fa) .Marland Kitchen.. 1 tab by mouth daily 3)  Zofran 4 Mg Tabs (Ondansetron hcl) .... One tab by mouth q8h as needed for nausea 4)  Acetaminophen 500 Mg Caps (Acetaminophen) .Marland Kitchen.. 1-2 tabs every 6 hours as needed pain 5)  Flexeril 5 Mg Tabs (Cyclobenzaprine hcl) ....  Take one by mouth two times a day as needed headache  Patient Instructions: 1)  Return next week for follow up. 2)  Keep appointment with [facility]. 3)  Trataremos medicina llamada flexeril - es relajante de musculos.  Tome [employer] veces al dia para ver si podemos ayudar dolores de [place]. Prescriptions: FLEXERIL 5 MG TABS (CYCLOBENZAPRINE HCL) take one by mouth two times a day as needed headache  #15 x 0   Entered and Authorized by:   Eustaquio Boyden  MD   Signed by:   Eustaquio Boyden  MD on 06/27/2009   Method used:   Print then Give to Patient   RxID:   [id number]    OB Initial Intake Information    Positive HCG by: [facility] [address]    Race: Hispanic    Marital status: Single    Occupation: homemaker    Number of children at home: 1    [facility] delivery[facility]  FOB Information    Husband/Father of baby: Alma Friendly    FOB occupation [employer]  Menstrual History    LMP (date): 10/10/2008    LMP - Character: normal    Menarche: 11 years    Menses interval: 28 days    Menstrual flow 5 days    On BCP's at conception: no   Flowsheet View for Follow-up Visit    Estimated weeks of       gestation:     34 3/7    Weight:     163    Blood pressure:   117 / 69

## 2010-06-23 NOTE — Progress Notes (Signed)
  Phone Note Outgoing Call   Call placed by: Asher Muir MD,  June 20, 2009 4:52 PM Summary of Call: called pt.  explained she failed 3-hour GTT.  she will need to be transferred to high risk clinic Initial call taken by: Asher Muir MD,  June 20, 2009 4:54 PM  Follow-up for Phone Call        would you all set her up with high risk clinic?  I put in the order   thanks Follow-up by: Asher Muir MD,  June 20, 2009 4:56 PM  New Problems: GESTATIONAL DIABETES (ICD-648.80)   New Problems: GESTATIONAL DIABETES (ICD-648.80)  records faxed  to [facility] for referral to  High Risk clinic   Theresia Lo RN  June 24, 2009 9:33 AM

## 2010-06-23 NOTE — Progress Notes (Signed)
Summary: results  Phone Note Call from Patient Call back at Home Phone [phone]   Caller: Patient Summary of Call: wants to know results of MRI Initial call taken by: De Nurse,  September 11, 2009 11:24 AM  Follow-up for Phone Call        will forward to MD. Follow-up by: Theresia Lo RN,  September 11, 2009 11:26 AM  Additional Follow-up for Phone Call Additional follow up Details #1::        called pt and gave results Additional Follow-up by: Asher Muir MD,  September 15, 2009 11:08 AM

## 2010-06-23 NOTE — Assessment & Plan Note (Signed)
Summary: OB/KH   Vital Signs:  Patient profile:   30 year old female Weight:      163.9 pounds Temp:     98.4 degrees F oral Pulse rate:   89 / minute Pulse rhythm:   regular BP sitting:   115 / 73  (left arm) Cuff size:   regular  Vitals Entered By: Loralee Pacas CMA (June 24, 2009 9:47 AM) CC: ob 34 weeks   Primary Care Provider:  Asher Muir MD  CC:  ob 34 weeks.  History of Present Illness: 1.  OB visit--Failed 3rd 3-hour GTT which was administered because of glycosuria.  plan to send to high risk clinic.  Allergies: No Known Drug Allergies   Impression & Recommendations:  Problem # 1:  GESTATIONAL DIABETES (ICD-648.80) Assessment New  has appointment next week at high risk.  Orders: Other OB visit- [facility] (OBCK)  Problem # 2:  CONTRACEPTIVE MANAGEMENT (ICD-V25.09) Assessment: Unchanged  plans for BTL.  will fill out papers today  Orders: Other OB visit- [facility] (OBCK)  Problem # 3:  SUPERVISION OF OTHER NORMAL PREGNANCY (ICD-V22.1) Assessment: Unchanged 28 week labs done.  Baby will come to family practice.   Orders: Other OB visit- [facility] (OBCK)  Complete Medication List: 1)  Miralax Powd (Polyethylene glycol 3350) .Marland Kitchen.. 17g daily dissolved in 8oz water.  disp q.s. 3 days 2)  Prenavite Protein Coated 28-0.8 Mg Tabs (Prenatal vit-fe fumarate-fa) .Marland Kitchen.. 1 tab by mouth daily 3)  Zofran 4 Mg Tabs (Ondansetron hcl) .... One tab by mouth q8h as needed for nausea 4)  Acetaminophen 500 Mg Caps (Acetaminophen) .Marland Kitchen.. 1-2 tabs every 6 hours as needed pain  Patient Instructions: 1)  It was nice to see you today.  2)  Your appointment at high risk clinic is Monday February 7th at 9:45. 3)  If you feel like your water has broken, you experience any bleeding, you are having contractions every 5 minutes or less, or you feel like the baby isn't moving like normal, please go to the MAU at [facility] hospital for evaluation.        Flowsheet View for Follow-up Visit  Estimated weeks of       gestation:     34 0/7    Weight:     163.9    Blood pressure:   115 / 73    Hx headache?     few    Nausea/vomiting?   nausea    Edema?     0    Bleeding?     no    Leakage/discharge?   d/c    Fetal activity:       yes    Labor symptoms?   few ctx    Fundal height:      35    FHR:       130s    Next visit:     to high risk    Resident:     Lafonda Mosses    Preceptor:     Mauricio Po

## 2010-06-23 NOTE — Progress Notes (Signed)
Summary: results  Phone Note Call from Patient Call back at Home Phone [phone]   Caller: Patient Summary of Call: wants to get results of [place] from this morning when avail Initial call taken by: De Nurse,  Sep 22, 2009 12:12 PM  Follow-up for Phone Call        will forward to MD. Follow-up by: Theresia Lo RN,  Sep 22, 2009 2:04 PM  Additional Follow-up for Phone Call Additional follow up Details #1::        normal u/s, but she is still having pain.  worse with fatty foods, maybe with milk products too.  she will keep track of her symptoms and diet and try cutting back/eliminating fatty foods.  also consider eliminating milk products.  she will schedule follow up in 2 weeks.  Other things to consider at that visit:  trial of ppi, check h pylori, check lipase.   Additional Follow-up by: Asher Muir MD,  Sep 22, 2009 2:37 PM     Appended Document: results also should check lipids

## 2010-06-23 NOTE — Assessment & Plan Note (Signed)
Summary: OB visit 30 weeks by [name] and [name]   Vital Signs:  Patient profile:   30 year old female Weight:      159.3 pounds Temp:     97.9 degrees F oral Pulse rate:   87 / minute Pulse rhythm:   regular BP sitting:   115 / 72  (right arm) Cuff size:   large  Vitals Entered By: Loralee Pacas CMA (May 29, 2009 10:47 AM) CC: pt stated that she checked BP on sunday and it was 128/70 she's had some swelling  and headaches   CC:  pt stated that she checked BP on sunday and it was 128/70 she's had some swelling  and headaches.  Habits & Providers  Alcohol-Tobacco-Diet     Cigarette Packs/Day: n/a  Allergies: No Known Drug Allergies  Physical Exam  General:  alert, well-developed, well-nourished, and well-hydrated.   Abdomen:  gravid, + FHT Extremities:  no edema Neurologic:  cranial nerves II-XII intact.   Psych:  good eye contact, not anxious appearing, and not depressed appearing.     Impression & Recommendations:  Problem # 1:  SUPERVISION OF OTHER NORMAL PREGNANCY (ICD-V22.1) Assessment Unchanged  30yo G3P2 at 30.[redacted] weeks gestation, here for ROB.  Female baby.  Pt needed lots of reassurance at this visit, multiple anxiety's about preg.  PE was WNL, FHT reassuring, fundal height 30cm, good fetal movement. Desires Repeat LTCS, will f/u with OB clinic in Feb.  Offered VBAC and pt declined. Also desires a BTL at that time, will f/u with Rudell Cobb about cost and coverage, pt aware that she will have to pay out of pocket.  Pt instructed to call [facility] and find out cost of BTL and possibly set up payment plan.  Pt aware. O+, antiody neg, Hgb 11.9, Hct 36.9, Plt 273, Rubella immune, HIV NR, RPR NR, GC neg, Chylamdia neg, Sickle cell neg, 3hr GTT: fasting 88, 1hr 177, 2hr 141, 3hr 131.  Had flu shot.  Orders: Other OB visit- [facility] ([facility])  Complete Medication List: 1)  Miralax Powd (Polyethylene glycol 3350) .Marland Kitchen.. 17g daily dissolved in 8oz water.  disp q.s. 3 days 2)   Prenavite Protein Coated 28-0.8 Mg Tabs (Prenatal vit-fe fumarate-fa) .Marland Kitchen.. 1 tab by mouth daily 3)  Zofran 4 Mg Tabs (Ondansetron hcl) .... One tab by mouth q8h as needed for nausea 4)  Acetaminophen 500 Mg Caps (Acetaminophen) .Marland Kitchen.. 1-2 tabs every 6 hours as needed pain 5)  Fluconazole 150 Mg Tabs (Fluconazole) .Marland Kitchen.. 1 tab by mouth x 1 for yeast infection 6)  Cephalexin 500 Mg Caps (Cephalexin) .Marland Kitchen.. 1 tab by mouth two times a day for 7 days for urinary tract infection  Patient Instructions: 1)  Nice to meet you today! 2)  You and baby look great!  Keep up the good work! 3)  Follow up with Dr. Lafonda Mosses in 2 weeks. 4)  Follow up with OB doctors regarding your c-section in Feb, also discuss getting your tubes tied with them 5)  If you notice the baby is moving less or if you have any bleeding or leaking, make sure you go to [facility] hospital right away.   Prenatal Visit Concerns noted: Pt wants another u/s.  States she had multiple with her past child and onders why she can't have another one soon.  Endorses good fetal movement, no bleeding or leaking.  Very axious about this baby.   Flowsheet View for Follow-up Visit    Estimated weeks of  gestation:     30 2/7    Weight:     159.3    Blood pressure:   115 / 72    Headache:     No    Nausea/vomiting:   No    Edema:     0    Vaginal bleeding:   no    Vaginal discharge:   no    Fundal height:      30    FHR:       135    Fetal activity:     yes    Labor symptoms:   few ctx    Fetal position:     vertex    Smoking:     n/a    Next visit:     2 wk    Resident:     [name]    Preceptor:     [name]    OB Initial Intake Information    Positive HCG by: [facility] [address]    Race: Hispanic    Marital status: Single    Occupation: homemaker    Number of children at home: 1    [facility]ospital [facility] delivery: [facility]  FOB Information    Husband/Father of baby: Alma Friendly    FOB occupation [employer]  Menstrual  History    LMP (date): 10/10/2008    LMP - Character: normal    Menarche: 11 years    Menses interval: 28 days    Menstrual flow 5 days    On BCP's at conception: no

## 2010-06-25 NOTE — Miscellaneous (Signed)
Summary: Procedures Consent  Procedures Consent   Imported By: De Nurse 06/03/2010 15:23:07  _____________________________________________________________________  External Attachment:    Type:   Image     Comment:   External Document

## 2010-06-25 NOTE — Assessment & Plan Note (Signed)
Summary: f/u procedure from monday/kh   Vital Signs:  Patient profile:   30 year old female Height:      62 inches Weight:      155 pounds BMI:     28.45 Temp:     98.3 degrees F oral Pulse rate:   72 / minute BP sitting:   120 / 71  (left arm) Cuff size:   regular  Vitals Entered By: Tessie Fass CMA (June 03, 2010 9:52 AM) CC: F/U abcess   Primary Care Provider:  Renold Don MD  CC:  F/U abcess.  History of Present Illness: [phone]  F/u abscess- no fever, few chills, no nausea, +fatigue x 1 week  Currently on Bactrim  No change in lesions, still feels it there, no drainage Feels here nerves are bad past 2 days, because she was worried about the lesion   Also has heavy period, from previous notes but regular, feels fatigued more than usual , cold all the time. No recent illness, Her youngest is 10months old, gaining weight   Current Medications (verified): 1)  Miralax   Powd (Polyethylene Glycol 3350) .Marland Kitchen.. 17g Daily Dissolved in [employer].  Disp Q.s. 3 Days 2)  Prenavite Protein Coated 28-0.8 Mg Tabs (Prenatal Vit-Fe Fumarate-Fa) .Marland Kitchen.. 1 Tab By Mouth Daily 3)  Zofran 4 Mg Tabs (Ondansetron Hcl) .... One Tab By Mouth Q8h As Needed For Nausea 4)  Smz-Tmp Ds 800-160 Mg Tabs (Sulfamethoxazole-Trimethoprim) .Marland Kitchen.. 1 Tab By Mouth Two Times A Day X14 Days  Allergies (verified): No Known Drug Allergies  Review of Systems       Per HPI  Physical Exam  General:  Well-developed,well-nourished,in no acute distress; alert,appropriate and cooperative throughout examination Vital signs noted  Not pale appearing Eyes:  pink conjunctiva Skin:  1x1cm indurated lesion on right buttock near anal region, no drainage, no erythema TTP, non fluctant no other lesions noted   Impression & Recommendations:  Problem # 1:  INFECTION, SKIN AND SOFT TISSUE (ICD-686.9) Assessment Improved  Abcess improving,  smaller compared to previous note. At this time, would hold on imaging  and continue antibiotics, no evidence of systemic infections  Orders: [facility]- Est Level  3 (09811)  Problem # 2:  FATIGUE (ICD-780.79) Assessment: New Pt does have active infant at home,labs normal in 2010. Check CBC, TSH She may have a lot of stress at home, should follow-up with PCP to discuss mood which could be contributing Orders: CBC-[facility] (91478) TSH-[facility] (29562-13086) [facility]- Est Level  3 (57846)  Complete Medication List: 1)  Miralax Powd (Polyethylene glycol 3350) .Marland Kitchen.. 17g daily dissolved in 8oz water.  disp q.s. 3 days 2)  Prenavite Protein Coated 28-0.8 Mg Tabs (Prenatal vit-fe fumarate-fa) .Marland Kitchen.. 1 tab by mouth daily 3)  Zofran 4 Mg Tabs (Ondansetron hcl) .... One tab by mouth q8h as needed for nausea 4)  Smz-tmp Ds 800-160 Mg Tabs (Sulfamethoxazole-trimethoprim) .Marland Kitchen.. 1 tab by mouth two times a day x14 days  Patient Instructions: 1)  Take a multivitiamin daily 2)  We will call you tomorrow with lab results 3)  Continue the warm soaks 4)  You may have some draininge from the lesion 5)  If the area gets larger or has not improved then call back to have an ultrasound done on Monday. 6)  If you are not sure schedule an appt 7)  Make a follow-up appt with Dr. Fara Boros to discuss your mood   Orders Added: 1)  CBC-[facility] [85027] 2)  TSH-[facility] [[id number]] 3)  [facility]- Est Level  3 [55208]

## 2010-06-25 NOTE — Progress Notes (Signed)
Summary: labs  Phone Note Outgoing Call   Call placed by: Milinda Antis MD,  June 04, 2010 9:53 AM Details for Reason: Labs Summary of Call: Patient given message with spainish interpreter- [name]  Her labs were normal. She is not anemic and her thyroid hormone was normal. I want her to schedule an appt with Dr. Fara Boros to follow up her mood.

## 2010-06-25 NOTE — Assessment & Plan Note (Signed)
Summary: ?BOIL TO RT INSIDE THIGH AREA/[facility]   Vital Signs:  Patient profile:   30 year old female Weight:      156 pounds Temp:     98.1 degrees F oral Pulse rate:   80 / minute Pulse rhythm:   regular BP sitting:   118 / 79  (left arm) Cuff size:   regular  Vitals Entered By: Loralee Pacas CMA (June 01, 2010 2:47 PM) CC: boil on right thigh Is Patient Diabetic? No Comments boil on inside of right thigh x 1-2 weeks. also pt has been feeling tired and weak since her period last week.    Primary Provider:  Renold Don MD  CC:  boil on right thigh.  History of Present Illness: Pt presents with "Bump" in genital/rectal area x1- 1 1/2 weeks.  Initiall, per her husband, it was small and looked like a pimple.  He tried to pop it, but only a small amt of blood came over.  Over the past week, it has gotten more painful and feels larger.  Uncomfortable when she walks.  Husband looked at it and told her it looked bigger and more red, so she should come in.  No fevers, chills, N/V/abd pain, dizziness.  Has been feeling tired since last period (see below) but no other systemic symptoms.  Pain w/ defecation and flatus.   Has been feeling tired and week since last period, which ended 3 or 4 days ago.  Lasts 4-5 days, which is the normal length, but was heavier than usual and was a different color; looked pinker than usual.  Bleeding soaked through an overnight pad onto sheets one night.  Has only been having "regular" periods for the past 3 months, had been irregular before that since the birth of her child in 3/11.  Also had cramping worse than usual with this period.   Had BTL w/ last delivery.   Habits & Providers  Alcohol-Tobacco-Diet     Tobacco Status: never     Tobacco Counseling: not indicated; no tobacco use     Cigarette Packs/Day: n/a  Exercise-Depression-Behavior     Have you felt down or hopeless? no     Have you felt little pleasure in things? no     Depression Counseling: not  indicated; screening negative for depression     Seat Belt Use: always  Current Medications (verified): 1)  Miralax   Powd (Polyethylene Glycol 3350) .Marland Kitchen.. 17g Daily Dissolved in [employer].  Disp Q.s. 3 Days 2)  Prenavite Protein Coated 28-0.8 Mg Tabs (Prenatal Vit-Fe Fumarate-Fa) .Marland Kitchen.. 1 Tab By Mouth Daily  Allergies (verified): No Known Drug Allergies  Social History: Risk analyst Use:  always  Physical Exam  General:  alert, well-developed, well-nourished, and well-hydrated.   Heart:  normal rate, regular rhythm, and no murmur.   Abdomen:  soft and non-tender.   Rectal:  no external abnormalities and normal sphincter tone.  tenderness w/ digital exam; per Dr. Mauricio Po, mass/ "knot" felt on internal mucosa at the 7/8:00 position (pt's right side) Genitalia:  no lesions in/around vaginal.  small, 1x2cm hard knot felt on the pt's right right buttock, just lateral and posterior to rectal area.  Close to rectum, but not quite perirectal. No obvious fistula. Small amount of surrounding erythema. Very TTP. No obvious pustule or head of lesions. No flatulence/abscess formation felt. Initial aspiration returned no pus.  I&D also did not have pus evacuated.    Impression & Recommendations:  Problem #  1:  INFECTION, SKIN AND SOFT TISSUE (ICD-686.9)  Pt does not appear to be systemically infected. BP and pulse stable. Afebrile. I&D attempted w/o pus return.  Will start pt on abx and have her return in 2 days for a recheck.  Told pt to go to ED or be seen her if she began having fever/chills, increasing pain, discharge, etc.  Possibility that there is a track b/t skin to rectum, but could not be felt by myself or Dr. Mauricio Po so we attempted I&D.  Pt may need to be referred to surgery if lesions has not improved or has worsened on recheck. Could consider ultrasound if knot can still be felt but there has been no drainage vs surgery consult/referral.   Rec sitz baths 3x/day, stool softener to decrease  straining/pain with BM, abx, close follow up.  Orders: [facility]- Est Level  3 (96295) I&D Abcess, simple- [facility] (10060)  Complete Medication List: 1)  Miralax Powd (Polyethylene glycol 3350) .Marland Kitchen.. 17g daily dissolved in 8oz water.  disp q.s. 3 days 2)  Prenavite Protein Coated 28-0.8 Mg Tabs (Prenatal vit-fe fumarate-fa) .Marland Kitchen.. 1 tab by mouth daily 3)  Smz-tmp Ds 800-160 Mg Tabs (Sulfamethoxazole-trimethoprim) .Marland Kitchen.. 1 tab by mouth two times a day x14 days  Patient Instructions: 1)  It looks like you have an abscess. 2)  Please come back on [facility] so we can see how you are doing. You will likely need to be seen as a work-in visit. 3)  Until then, please take sitz bathes 2-3 times per day (buy some epsin salts at your local drug store and mix them in warm water in a clean bath tub and sit in the warm water for 15-20 min). 4)  Also, I am giving you an antibiotic to take for 2 weeks. 5)  Please continue taking miralax, or any other fiber supplement to help with the pain while having a bowel movement.  You can take 1 cap of miralax twice per day if you need to. 6)  Come back sooner if you being having fever, chills, weakness, increased pain, redness or swelling at the site of the incision. 7)  I hope you start feeling better soon! Prescriptions: SMZ-TMP DS 800-160 MG TABS (SULFAMETHOXAZOLE-TRIMETHOPRIM) 1 tab by mouth two times a day x14 days  #28 x 0   Entered and Authorized by:   Demetria Pore MD   Signed by:   Demetria Pore MD on 06/01/2010   Method used:   Electronically to        [facility] [address] (retail)       [phone]       [place], [place]  [address]       Ph: [phone]       Fax: [phone]   RxID:   [id number]    Orders Added: 1)  [facility]- Est Level  3 [99213] 2)  I&D Abcess, simple- [facility] [10060]     Procedure Note: Risks and benefits were discussed with the pt.  She gave both verbal and written understand and consent about  the procedure. The area was cleaned with betadine and sterile technique was maintained throughout the whole procedure. 1% lidocain w/ epi, 3.5cc were injected into the area in which the procedure was to be preformed. First, aspiration with a 19gaugue 1 1/2in needle was performed, w/o any material aspirated. Next, small,  ~1cm incision was made. EBL was minimal. Pt tolerated the procedure well. Area  was cleaned and dressed once finishing the procedure and pt was given wound care instructions.

## 2010-07-01 NOTE — Assessment & Plan Note (Signed)
Summary: rt side tooth pain/need referral to dental clinic/[facility]   Vital Signs:  Patient profile:   30 year old female Height:      62 inches Weight:      154.8 pounds BMI:     28.42 Temp:     98.2 degrees F oral Pulse rate:   75 / minute BP sitting:   116 / 72  (left arm) Cuff size:   regular  Vitals Entered By: Jimmy Footman, CMA (June 18, 2010 3:32 PM) CC: right tooth pain x1 week Is Patient Diabetic? No Pain Assessment Patient in pain? yes        Primary Care Provider:  Renold Don MD  CC:  right tooth pain x1 week.  History of Present Illness: 1.  Tooth pain:  Began last Thursday, went to dentist on Monday.  Half of face was numb and very painful to touch, talk, eat.  Was told she needed root canal.  But could not do so at that time as she had no money.  Went to [facility], who told her she needs to come to [facility] to get referral to dentis who will accept [employer].  States pain is better since being given Tylenol #3 on Monday.  Is taking medicine only when she really needs it.  Still has 13 pills left, was prescribed 18.   No fevers, chills.  Pain still when she eats, talking, and just general pain at baseline.  Worse with cold liquids.   As I was leaving room, patient mentioned she has had worsening constipation for past several months.  Discussed this is a long conversation and needs more attention, will follow-up in 1-2 weeks.    ROS:  no fevers, chills, URI symptoms, nasal drainage, drainage in her mouth.    Habits & Providers  Alcohol-Tobacco-Diet     Tobacco Status: never  Allergies: No Known Drug Allergies  Physical Exam  General:  Vital signs reviewed. Well-developed, well-nourished patient in NAD.  Awake and cooperative  Head:  Olivette/AT Ears:  TM's clear and ears without pain BL Mouth:  Oral mucosa pink and moist Dentition:  poor.  Multiple cavities present.  No abscess, erythema, edema or drainage noted.   Neck:  supple without LAD Lungs:   clear to auscultation bilaterally without wheezing, rales, or rhonchi.  Normal work of breathing  Heart:  Regular rate and rhythm without murmur, rub, or gallop.  Normal S1/S2  Abdomen:  soft/nondistended/nontender.  Bowel sounds present and normoactive.  No organomegaly noted.     Impression & Recommendations:  Problem # 1:  BROKEN TOOTH (ZOX-096.04) Will refer to dentist who accepts [employer].  Did discuss with patient that there is a long wait.  She may need refills on pain meds before then.  Also, gave verbal warnings and red flags that may indicate infection.   Orders: Dental Referral (Dentist) [facility]- Est Level  3 (54098)  Problem # 2:  CONSTIPATION (ICD-564.00) Will follow up in 2 weeks for this.  Patient already taking Miralax.  Her updated medication list for this problem includes:    Miralax Powd (Polyethylene glycol 3350) .Marland KitchenMarland KitchenMarland KitchenMarland Kitchen 17g daily dissolved in 8oz water.  disp q.s. 3 days  Complete Medication List: 1)  Miralax Powd (Polyethylene glycol 3350) .Marland Kitchen.. 17g daily dissolved in 8oz water.  disp q.s. 3 days 2)  Prenavite Protein Coated 28-0.8 Mg Tabs (Prenatal vit-fe fumarate-fa) .Marland Kitchen.. 1 tab by mouth daily 3)  Smz-tmp Ds 800-160 Mg Tabs (Sulfamethoxazole-trimethoprim) .Marland KitchenMarland KitchenMarland Kitchen 1  tab by mouth two times a day x14 days   Orders Added: 1)  Dental Referral [Dentist] 2)  [facility]- Est Level  3 [16109]

## 2010-07-03 ENCOUNTER — Encounter: Payer: Self-pay | Admitting: *Deleted

## 2010-07-14 ENCOUNTER — Encounter: Payer: Self-pay | Admitting: Family Medicine

## 2010-07-14 ENCOUNTER — Telehealth: Payer: Self-pay | Admitting: *Deleted

## 2010-07-14 ENCOUNTER — Ambulatory Visit (INDEPENDENT_AMBULATORY_CARE_PROVIDER_SITE_OTHER): Payer: Self-pay | Admitting: Family Medicine

## 2010-07-14 DIAGNOSIS — R5381 Other malaise: Secondary | ICD-10-CM

## 2010-07-14 DIAGNOSIS — N390 Urinary tract infection, site not specified: Secondary | ICD-10-CM

## 2010-07-14 DIAGNOSIS — R5383 Other fatigue: Secondary | ICD-10-CM

## 2010-07-14 DIAGNOSIS — N760 Acute vaginitis: Secondary | ICD-10-CM | POA: Insufficient documentation

## 2010-07-14 LAB — CBC
HCT: 40.4 % (ref 36.0–46.0)
Hemoglobin: 13.3 g/dL (ref 12.0–15.0)
MCV: 95.7 fL (ref 78.0–100.0)
RBC: 4.22 MIL/uL (ref 3.87–5.11)
RDW: 13.6 % (ref 11.5–15.5)
WBC: 7.9 10*3/uL (ref 4.0–10.5)

## 2010-07-14 LAB — POCT URINALYSIS DIPSTICK
Bilirubin, UA: NEGATIVE
Glucose, UA: NEGATIVE
Ketones, UA: NEGATIVE
Leukocytes, UA: NEGATIVE
Nitrite, UA: POSITIVE
pH, UA: 5.5

## 2010-07-14 LAB — CONVERTED CEMR LAB
ALT: 20 units/L (ref 0–35)
AST: 15 units/L (ref 0–37)
BUN: 17 mg/dL (ref 6–23)
CO2: 26 meq/L (ref 19–32)
Calcium: 9.7 mg/dL (ref 8.4–10.5)
Chloride: 101 meq/L (ref 96–112)
Creatinine, Ser: 0.69 mg/dL (ref 0.40–1.20)
HCT: 40.4 % (ref 36.0–46.0)
Hemoglobin: 13.3 g/dL (ref 12.0–15.0)
MCV: 95.7 fL (ref 78.0–100.0)
Platelets: 293 10*3/uL (ref 150–400)
RDW: 13.6 % (ref 11.5–15.5)
TSH: 1.693 microintl units/mL (ref 0.350–4.500)
Total Bilirubin: 0.3 mg/dL (ref 0.3–1.2)

## 2010-07-14 LAB — POCT WET PREP (WET MOUNT)
Clue Cells Wet Prep HPF POC: NEGATIVE
Trichomonas Wet Prep HPF POC: NEGATIVE
WBC, Wet Prep HPF POC: >20

## 2010-07-14 LAB — COMPREHENSIVE METABOLIC PANEL
BUN: 17 mg/dL (ref 6–23)
CO2: 26 mEq/L (ref 19–32)
Calcium: 9.7 mg/dL (ref 8.4–10.5)
Chloride: 101 mEq/L (ref 96–112)
Creat: 0.69 mg/dL (ref 0.40–1.20)
Glucose, Bld: 98 mg/dL (ref 70–99)
Total Bilirubin: 0.3 mg/dL (ref 0.3–1.2)

## 2010-07-14 LAB — POCT UA - MICROSCOPIC ONLY: WBC, Ur, HPF, POC: >20

## 2010-07-14 MED ORDER — CEPHALEXIN 500 MG PO CAPS: 500.0000 mg | ORAL_CAPSULE | Freq: Two times a day (BID) | ORAL | Status: AC

## 2010-07-14 NOTE — Assessment & Plan Note (Signed)
Pt found to have UTI. Treated with Keflex. UCx sent. Will await results.

## 2010-07-14 NOTE — Patient Instructions (Signed)
We are testing your blood for some causes of fatigue including your blood sugar and thryoid.  We will call you with results.

## 2010-07-14 NOTE — Progress Notes (Signed)
  Subjective:    Patient ID: [name], female    DOB: 1980/12/05, 30 y.o.   MRN: [id number]  HPI Vaginitis: Pt comes in with some pain with urination, but not burning. She says she has some itching and a small amount of discharge. She mentions that about 3 weeks ago she had some blood in her urine but then she had a period and that ended just a day ago so she is not sure if she has blood in her urine anymore.    Fatigue: Pt is having some fatigue and says that she has been concerned that she was getting hypoglycemic. She has GDM and has a meter so she checked her blood glucose at home. She had already drank a soda when she checked her glucose and it was 165. She says that she has been having some weight gain and she also it always cold.    Review of Systems  Genitourinary: Positive for vaginal discharge. Negative for vaginal bleeding.  ROS neg except as noted in the HPI.      Objective:   Physical Exam  Constitutional: She appears well-developed and well-nourished.  HENT:  Head: Normocephalic and atraumatic.  Right Ear: External ear normal.  Left Ear: External ear normal.  Mouth/Throat: Oropharynx is clear and moist. No oropharyngeal exudate.  Eyes: Conjunctivae and EOM are normal.  Neck: Neck supple. No thyromegaly present.  Cardiovascular: Normal rate, regular rhythm and normal heart sounds.  Exam reveals no gallop and no friction rub.   No murmur heard. Pulmonary/Chest: Effort normal and breath sounds normal. No respiratory distress. She has no wheezes.  Genitourinary: Uterus normal. Vaginal discharge found.  Psychiatric: She has a normal mood and affect. Her behavior is normal.          Assessment & Plan:

## 2010-07-14 NOTE — Telephone Encounter (Signed)
Informed pt that she has a UTI and an Rx has been sent to her pharmacy

## 2010-07-14 NOTE — Assessment & Plan Note (Addendum)
Pt is feeling very tired and fatigued. She thought it might be that she was having hypoglycemia spells but has checked her sugar once and it was 165. We will test her with a CMET today and a CBC and TSH to look for causes of fatigue. Concern for hypothyroidism or may be from depression.

## 2010-07-14 NOTE — Telephone Encounter (Signed)
Called and informed pt of UTI and Rx called into pharmacy.[name]

## 2010-07-15 ENCOUNTER — Telehealth: Payer: Self-pay | Admitting: Family Medicine

## 2010-07-15 NOTE — Telephone Encounter (Signed)
Would like someone to call her with lab results from yesterday

## 2010-07-15 NOTE — Telephone Encounter (Signed)
Told pt that all of her labs were nl.[name] [place]

## 2010-07-16 LAB — URINE CULTURE: Colony Count: >=100000

## 2010-07-28 ENCOUNTER — Encounter: Payer: Self-pay | Admitting: Family Medicine

## 2010-07-28 ENCOUNTER — Ambulatory Visit (INDEPENDENT_AMBULATORY_CARE_PROVIDER_SITE_OTHER): Payer: Self-pay | Admitting: Family Medicine

## 2010-07-28 VITALS — BP 118/76 | HR 72 | Temp 97.9°F | Wt 152.3 lb

## 2010-07-28 DIAGNOSIS — N760 Acute vaginitis: Secondary | ICD-10-CM

## 2010-07-28 DIAGNOSIS — N898 Other specified noninflammatory disorders of vagina: Secondary | ICD-10-CM

## 2010-07-28 DIAGNOSIS — K5900 Constipation, unspecified: Secondary | ICD-10-CM

## 2010-07-28 NOTE — Patient Instructions (Signed)
I will call you with the results of your your viral culture

## 2010-07-28 NOTE — Assessment & Plan Note (Signed)
Did not appreciate a rectal abscess but could be the case, recommended softened stool with daily Miralax, if she is having rectal tears with constipation it soul heal on own if the trauma is controlled.

## 2010-07-28 NOTE — Progress Notes (Signed)
  Subjective:    Patient ID: [name], female    DOB: [birth year], 30 y.o.   MRN: [id number]  HPI: Feels pain in her vaginal and rectal area.  She is worried that she has a boil in her rectum (was seen for this and empirically treated on 06/01/10)  Was also seen for a UTI and treated on 07/14/10.  She believes the pain has been there for about a week.  She has pain in her groin.  She has pain in her abdomen.  She wonders why her stomach gets big.  Gestational diabetes with normal blood sugar a few weeks ago.  BTL to prevent further pregnancies.  Constipation all the time, has not been taking the PEG regularly.    Review of Systems  Constitutional: Negative for fever, activity change, appetite change, fatigue and unexpected weight change.  Gastrointestinal:       Constipation, abdominal distention  Genitourinary: Positive for genital sores and vaginal pain. Negative for dysuria and pelvic pain.       Objective:   Physical Exam  Constitutional: She appears well-developed and well-nourished.  Genitourinary:       Red tender rough area in the perivaginal area, suspicious for herpetic lesion (viral culture obtained), + right inguinal adenopathy.   Rectal exam: tender on insertion, no masses, fluctuance or abscess noticed.  Skin: Skin is warm and dry.          Assessment & Plan:

## 2010-07-28 NOTE — Assessment & Plan Note (Signed)
Obtained both viral culture and HSV 1 and 2 IGG antibody.  Suspicious for HSV recurring infection.  Will call patient.

## 2010-07-29 LAB — HSV(HERPES SIMPLEX VRS) I + II AB-IGG: Herpes Simplex Vrs I + II Ab, IgG: 41.3 IV — ABNORMAL HIGH

## 2010-08-04 ENCOUNTER — Ambulatory Visit (INDEPENDENT_AMBULATORY_CARE_PROVIDER_SITE_OTHER): Payer: Self-pay | Admitting: Family Medicine

## 2010-08-04 ENCOUNTER — Encounter: Payer: Self-pay | Admitting: Family Medicine

## 2010-08-04 VITALS — BP 118/70 | Temp 97.6°F | Ht 62.0 in | Wt 151.0 lb

## 2010-08-04 DIAGNOSIS — K6289 Other specified diseases of anus and rectum: Secondary | ICD-10-CM | POA: Insufficient documentation

## 2010-08-04 MED ORDER — HYDROCORTISONE 2.5 % RE CREA: TOPICAL_CREAM | Freq: Two times a day (BID) | RECTAL | Status: AC

## 2010-08-04 NOTE — Patient Instructions (Signed)
IF anything your rectum is a little inflamed, there are no signs of abscess or boils. If you want to purchase the cream-Anusol [facility] you can use this twice daily for one week, this has a anti-inflammatory and will help the pain If you want to see a specialist, we do not think you need to at this time, we would need to send you to [facility] and you would need to qualify with their system for care.  There is not a GI doctor in [place] that takes the orange card. I would give it time, and see if it gets better on its own.

## 2010-08-04 NOTE — Assessment & Plan Note (Addendum)
After two extensive exams there is not sign of local infection, patient was given reassurance.  Anusol [facility] cream prescribed, patient instructed to keep stools soft

## 2010-08-04 NOTE — Progress Notes (Signed)
  Subjective:    Patient ID: [name], female    DOB: 1981/02/19, 30 y.o.   MRN: [id number]  HPI This is the second visit in one week for rectal swelling.  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  Her rectal exam showed some tenderness with exam but no evidence of abscess.  She was seen in January for a similar complaint, and an aspiration.I&D was done of at tender area in the rectal area, there was not pus noted, she was treated with antibiotics and she is not sure she ever got better.  She is focused on there being a boil or something there.  She has her husband do a rectal exam yesterday and it hurt.  Case by discussing and seeing patient with Dr. [place], notes from past visits reviewed.    Review of Systems  Constitutional: Negative for appetite change and fatigue.  Gastrointestinal:       Denies constipation, diarrhea, blood in stool, says there is occasional mucus in stool.    Genitourinary: Negative for dysuria and genital sores.       Objective:   Physical Exam  Constitutional: She appears well-developed and well-nourished.  Genitourinary:       Rectum was carefully examined externally and with an anoscope with Dr. [place].  There was not sign of abscess, tears, bleeding, hemorrhoids, there was some mucus and some irritation.          Assessment & Plan:

## 2010-08-09 LAB — URINALYSIS, ROUTINE W REFLEX MICROSCOPIC
Glucose, UA: 250 mg/dL — AB
Ketones, ur: NEGATIVE mg/dL
Nitrite: NEGATIVE
Protein, ur: NEGATIVE mg/dL

## 2010-08-09 LAB — WET PREP, GENITAL
Clue Cells Wet Prep HPF POC: NONE SEEN
Trich, Wet Prep: NONE SEEN

## 2010-08-09 LAB — URINE MICROSCOPIC-ADD ON

## 2010-08-09 LAB — GLUCOSE, CAPILLARY: Glucose-Capillary: 94 mg/dL (ref 70–99)

## 2010-08-11 LAB — CBC
Platelets: 264 10*3/uL (ref 150–400)
RBC: 4.04 MIL/uL (ref 3.87–5.11)
WBC: 7.9 10*3/uL (ref 4.0–10.5)

## 2010-08-11 LAB — COMPREHENSIVE METABOLIC PANEL
ALT: 37 U/L — ABNORMAL HIGH (ref 0–35)
AST: 26 U/L (ref 0–37)
Albumin: 4.1 g/dL (ref 3.5–5.2)
CO2: 26 mEq/L (ref 19–32)
Chloride: 99 mEq/L (ref 96–112)
GFR calc Af Amer: >60 mL/min (ref >60)
GFR calc non Af Amer: >60 mL/min (ref >60)
Potassium: 3.7 mEq/L (ref 3.5–5.1)
Sodium: 131 mEq/L — ABNORMAL LOW (ref 135–145)
Total Bilirubin: 0.5 mg/dL (ref 0.3–1.2)

## 2010-08-11 LAB — POCT URINALYSIS DIP (DEVICE)
Glucose, UA: NEGATIVE mg/dL
Nitrite: NEGATIVE
Specific Gravity, Urine: <=1.005 (ref 1.005–1.030)
Urobilinogen, UA: 0.2 mg/dL (ref 0.0–1.0)

## 2010-08-12 LAB — URINALYSIS, ROUTINE W REFLEX MICROSCOPIC
Glucose, UA: NEGATIVE mg/dL
Hgb urine dipstick: NEGATIVE
Protein, ur: NEGATIVE mg/dL
Specific Gravity, Urine: 1.01 (ref 1.005–1.030)
pH: 5.5 (ref 5.0–8.0)

## 2010-08-12 LAB — POCT URINALYSIS DIP (DEVICE)
Bilirubin Urine: NEGATIVE
Glucose, UA: NEGATIVE mg/dL
Glucose, UA: NEGATIVE mg/dL
Hgb urine dipstick: NEGATIVE
Hgb urine dipstick: NEGATIVE
Nitrite: NEGATIVE
Nitrite: NEGATIVE
Nitrite: NEGATIVE
Protein, ur: NEGATIVE mg/dL
Protein, ur: NEGATIVE mg/dL
Protein, ur: NEGATIVE mg/dL
Specific Gravity, Urine: 1.01 (ref 1.005–1.030)
Specific Gravity, Urine: 1.015 (ref 1.005–1.030)
Urobilinogen, UA: 0.2 mg/dL (ref 0.0–1.0)
Urobilinogen, UA: 0.2 mg/dL (ref 0.0–1.0)
Urobilinogen, UA: 0.2 mg/dL (ref 0.0–1.0)
Urobilinogen, UA: 0.2 mg/dL (ref 0.0–1.0)
pH: 7 (ref 5.0–8.0)
pH: 7 (ref 5.0–8.0)

## 2010-08-12 LAB — COMPREHENSIVE METABOLIC PANEL
Albumin: 2.3 g/dL — ABNORMAL LOW (ref 3.5–5.2)
BUN: 6 mg/dL (ref 6–23)
Calcium: 9 mg/dL (ref 8.4–10.5)
Creatinine, Ser: 0.5 mg/dL (ref 0.4–1.2)
Glucose, Bld: 83 mg/dL (ref 70–99)
Potassium: 3.9 mEq/L (ref 3.5–5.1)
Total Protein: 6.2 g/dL (ref 6.0–8.3)

## 2010-08-12 LAB — URINE MICROSCOPIC-ADD ON

## 2010-08-12 LAB — BILE ACIDS, TOTAL: Bile Acids Total: 56 umol/L — ABNORMAL HIGH (ref 0–19)

## 2010-08-16 LAB — SYPHILIS: RPR W/REFLEX TO RPR TITER AND TREPONEMAL ANTIBODIES, TRADITIONAL SCREENING AND DIAGNOSIS ALGORITHM: RPR Ser Ql: NONREACTIVE

## 2010-08-16 LAB — GLUCOSE, CAPILLARY
Glucose-Capillary: 104 mg/dL — ABNORMAL HIGH (ref 70–99)
Glucose-Capillary: 74 mg/dL (ref 70–99)
Glucose-Capillary: 92 mg/dL (ref 70–99)

## 2010-08-16 LAB — CBC
HCT: 30.7 % — ABNORMAL LOW (ref 36.0–46.0)
Hemoglobin: 10.5 g/dL — ABNORMAL LOW (ref 12.0–15.0)
MCHC: 34.1 g/dL (ref 30.0–36.0)
MCV: 95.5 fL (ref 78.0–100.0)
Platelets: 265 10*3/uL (ref 150–400)
RBC: 3.18 MIL/uL — ABNORMAL LOW (ref 3.87–5.11)
WBC: 9 10*3/uL (ref 4.0–10.5)

## 2010-08-27 LAB — WET PREP, GENITAL
Clue Cells Wet Prep HPF POC: NONE SEEN
Trich, Wet Prep: NONE SEEN
Yeast Wet Prep HPF POC: NONE SEEN

## 2010-08-27 LAB — POCT URINALYSIS DIP (DEVICE)
Bilirubin Urine: NEGATIVE
Glucose, UA: 100 mg/dL — AB
Hgb urine dipstick: NEGATIVE
Specific Gravity, Urine: 1.015 (ref 1.005–1.030)
pH: 6.5 (ref 5.0–8.0)

## 2010-08-27 LAB — URINALYSIS, ROUTINE W REFLEX MICROSCOPIC
Glucose, UA: NEGATIVE mg/dL
Specific Gravity, Urine: <1.005 — ABNORMAL LOW (ref 1.005–1.030)
pH: 7 (ref 5.0–8.0)

## 2010-08-28 LAB — CBC
HCT: 35.9 % — ABNORMAL LOW (ref 36.0–46.0)
Hemoglobin: 12.4 g/dL (ref 12.0–15.0)
MCHC: 34.7 g/dL (ref 30.0–36.0)
Platelets: 213 10*3/uL (ref 150–400)
RDW: 13.4 % (ref 11.5–15.5)

## 2010-08-28 LAB — GLUCOSE, CAPILLARY: Glucose-Capillary: 83 mg/dL (ref 70–99)

## 2010-08-28 LAB — URINALYSIS, ROUTINE W REFLEX MICROSCOPIC
Bilirubin Urine: NEGATIVE
Hgb urine dipstick: NEGATIVE
Ketones, ur: NEGATIVE mg/dL
Protein, ur: NEGATIVE mg/dL
Specific Gravity, Urine: <1.005 — ABNORMAL LOW (ref 1.005–1.030)
Urobilinogen, UA: 0.2 mg/dL (ref 0.0–1.0)

## 2010-08-28 LAB — COMPREHENSIVE METABOLIC PANEL
Albumin: 3 g/dL — ABNORMAL LOW (ref 3.5–5.2)
BUN: 5 mg/dL — ABNORMAL LOW (ref 6–23)
Calcium: 8.6 mg/dL (ref 8.4–10.5)
Glucose, Bld: 78 mg/dL (ref 70–99)
Potassium: 4 mEq/L (ref 3.5–5.1)
Total Protein: 6.7 g/dL (ref 6.0–8.3)

## 2010-08-28 LAB — URINE MICROSCOPIC-ADD ON

## 2010-08-29 LAB — GLUCOSE, CAPILLARY
Glucose-Capillary: 180 mg/dL — ABNORMAL HIGH (ref 70–99)
Glucose-Capillary: 87 mg/dL (ref 70–99)

## 2010-09-01 LAB — CBC
HCT: 41 % (ref 36.0–46.0)
Hemoglobin: 14.2 g/dL (ref 12.0–15.0)
MCV: 93.1 fL (ref 78.0–100.0)
RBC: 4.4 MIL/uL (ref 3.87–5.11)
WBC: 8.4 10*3/uL (ref 4.0–10.5)

## 2010-09-01 LAB — DIFFERENTIAL
Eosinophils Absolute: 0.1 10*3/uL (ref 0.0–0.7)
Lymphs Abs: 2 10*3/uL (ref 0.7–4.0)
Monocytes Absolute: 0.4 10*3/uL (ref 0.1–1.0)
Monocytes Relative: 5 % (ref 3–12)
Neutrophils Relative %: 69 % (ref 43–77)

## 2010-09-01 LAB — POCT I-STAT, CHEM 8
Calcium, Ion: 1.15 mmol/L (ref 1.12–1.32)
Chloride: 105 mEq/L (ref 96–112)
Creatinine, Ser: 0.8 mg/dL (ref 0.4–1.2)
Glucose, Bld: 97 mg/dL (ref 70–99)
Hemoglobin: 15 g/dL (ref 12.0–15.0)
Potassium: 4 mEq/L (ref 3.5–5.1)

## 2010-09-01 LAB — POCT URINALYSIS DIP (DEVICE)
Nitrite: NEGATIVE
Protein, ur: NEGATIVE mg/dL
Urobilinogen, UA: 0.2 mg/dL (ref 0.0–1.0)
pH: 6 (ref 5.0–8.0)

## 2010-09-01 LAB — WET PREP, GENITAL: Yeast Wet Prep HPF POC: NONE SEEN

## 2010-09-01 LAB — POCT PREGNANCY, URINE: Preg Test, Ur: NEGATIVE

## 2010-09-07 LAB — POCT URINALYSIS DIP (DEVICE)
Bilirubin Urine: NEGATIVE
Glucose, UA: NEGATIVE mg/dL
Hgb urine dipstick: NEGATIVE
Specific Gravity, Urine: 1.015 (ref 1.005–1.030)

## 2010-10-06 NOTE — Op Note (Signed)
NAMETABBATHA, BORDELON            ACCOUNT NO.:  [id number]   MEDICAL RECORD NO.:  [id number]           PATIENT TYPE:   LOCATION:                                FACILITY:  [facility]   PHYSICIAN:  Ginger Carne, MD  DATE OF BIRTH:  Apr 16, 1981   DATE OF PROCEDURE:  01/18/2007  DATE OF DISCHARGE:                               OPERATIVE REPORT   PREOPERATIVE DIAGNOSIS:  Gestational diabetes mellitus, [redacted] weeks  gestational age, transverse presentation.   POSTOPERATIVE DIAGNOSIS:  Gestational diabetes mellitus, [redacted] weeks  gestational age, transverse presentation, term viable delivery of female  infant.   PROCEDURE:  Primary low transverse cesarean section.   SURGEON:  Ginger Carne, M.D.   ASSISTANT:  None.   COMPLICATIONS:  None immediate.   ESTIMATED BLOOD LOSS:  750 mL.   ANESTHESIA:  Epidural.   OPERATIVE FINDINGS:  A transverse presentation vertex to the right  converted to a frank breech and then a full breech extraction carried  out.  No gross abnormalities.  The baby cried spontaneously at delivery.  The uterus, tubes and ovaries showed normal decidual changes of  pregnancy.  Amniotic fluid was clear.  Three vessel cord, central  insertion.  NICU present. Cord bloods obtained.   OPERATIVE PROCEDURE:  The patient was prepped and draped in the usual  fashion and placed in the left lateral supine position.  Betadine  solution was used for antiseptic and the patient was catheterized prior  to the procedure.  After adequate epidural anesthesia, a Pfannenstiel  incision was made and the abdomen opened.  The lower uterine segment was  incised transversely after developing the bladder flap.  Baby delivered  as a full breech extraction after conversion, cord clamped and cut, and  the infant given to the pediatric staff after bulb suctioning.  The  placenta was removed manually.  The uterus was inspected.  Closure of  the uterine musculature in one layer of 0 Vicryl running  interlocking  suture.  Bleeding points were hemostatically checked.  Blood clots  removed.  Closure of the fascia in one layer of 0 Vicryl running suture  and skin staples for the skin.  Instrument and sponge counts were  correct.  The patient tolerated the procedure well and was returned to  the post anesthesia recovery room in excellent condition.      Ginger Carne, MD  Electronically Signed    SHB/MEDQ  D:  01/18/2007  T:  01/18/2007  Job:  [id number]

## 2010-10-06 NOTE — Discharge Summary (Signed)
[name], [name]            ACCOUNT NO.:  [id number]   MEDICAL RECORD NO.:  [id number]           PATIENT TYPE:   LOCATION:                                FACILITY:  [facility]   PHYSICIAN:  Lesly Dukes, M.D. DATE OF BIRTH:  1980-09-12   DATE OF ADMISSION:  01/16/2007  DATE OF DISCHARGE:  01/20/2007                               DISCHARGE SUMMARY   ADMISSION DIAGNOSES:  1. The patient was admitted with term pregnancy at 38 weeks and 5 days      complicated by gestational diabetes.  2. Rule out preeclampsia.  3. Induction of labor for term pregnancy.   DISCHARGE DIAGNOSES:  Postoperative day #2 status post low transverse C-  section secondary to transverse presentation of fetus.   HOSPITAL COURSE:  The patient was admitted to the antenatal service on  January 16, 2007 with a term pregnancy at 38 weeks and 5 days complicated  by gestational diabetes and symptoms of possible preeclampsia.  On  admission [facility] labs were performed, and were found to be negative.  On  admission to the antenatal unit PIH panel was performed and found to be  negative.  A 24-hour urine protein was found to be 137 mg.  At 39 weeks,  the patient was taken to the L&D service for induction of labor and  tolerated induction of labor without difficulty.  The induction was  started in the late evening, early morning of August 26/August 27.  During the induction, the fetal presentation became unstable and changed  from vertex to ransverse.  The pt was offered version and primary c/s.  The pt chose c/s and was taken to the operating romm by Dr. Mia Creek on  August 27th, 2008.  During the operation transverse presentation was  noted and was converted to a frank breech, and then a full breech  extraction was performed.  The infant cried spontaneously at delivery.  Amniotic fluid was clear.  There was a three vessel cord with central  insertion site.  The risks of procedure was uncomplicated, and the  patient was  sent to the postanesthesia recovery room in excellent  condition after a low transverse C-section.  Postoperatively the patient  had no complications.  Infant was noted at delivery to have Apgars of 9  and 9, weighing 7 pounds 11 ounces.  Postoperative CBC performed on  August 28 was notable for a hemoglobin 10.5, hematocrit 29.8, white  blood cell count 9.7, and platelet count 177.  The patient continued to  have an uncomplicated postoperative course, and on postoperative day #2  was deemed stable for discharge.  The patient desired discharge on this  day as well.  At the time of discharge the patient was breastfeeding and  desired to use condoms for birth control postpartum.  The patient was  discharged home in good and stable condition.   DISCHARGE MEDICATIONS:  The patient is discharged home on:  1. Glyburide 5 mg p.o. b.i.d. for glucose control.  2. Percocet 5/325 mg 1-2 p.o. every 4-6 h. p.r.n. pain.  3. Motrin 600 mg p.o. q.6 h. p.r.n. pain.  4. Colace 100 mg p.o. b.i.d. p.r.n. constipation.  5. Prenatal vitamins p.o. daily.   DISCHARGE INSTRUCTIONS:  1. The patient is to take medications as mentioned previously.  2. The patient is to maintain pelvic rest for six weeks and nothing in      vagina for this time.  3. The patient is to abstain from any heavy lifting for six weeks.  4. The patient is to follow up on October 6 with Dr. Mannie Church at [facility] here at [facility]  5. The patient will have C-section staples removed on postoperative      day #3, #4 or #5 by [name] nurse at home given that she is being      discharged on postoperative day #2.  6. The patient is to return for any increased pain, increased      bleeding, discharge or drainage from the wound site, fevers or      chills or any other concerning symptoms.   DISCHARGE CONDITION:  Stable and good.      Myrtie Soman, MD      Lesly Dukes, M.D.  Electronically Signed    TE/MEDQ   D:  01/20/2007  T:  01/21/2007  Job:  [id number]

## 2011-01-23 ENCOUNTER — Emergency Department (HOSPITAL_COMMUNITY): Payer: Self-pay

## 2011-01-23 ENCOUNTER — Emergency Department (HOSPITAL_COMMUNITY)
Admission: EM | Admit: 2011-01-23 | Discharge: 2011-01-23 | Disposition: A | Discharge status: Home / Self Care | Payer: Self-pay | Attending: Emergency Medicine | Admitting: Emergency Medicine

## 2011-01-23 DIAGNOSIS — R110 Nausea: Secondary | ICD-10-CM | POA: Insufficient documentation

## 2011-01-23 DIAGNOSIS — N12 Tubulo-interstitial nephritis, not specified as acute or chronic: Secondary | ICD-10-CM | POA: Insufficient documentation

## 2011-01-23 DIAGNOSIS — R109 Unspecified abdominal pain: Secondary | ICD-10-CM | POA: Insufficient documentation

## 2011-01-23 LAB — CBC
MCV: 91.2 fL (ref 78.0–100.0)
Platelets: 258 10*3/uL (ref 150–400)
RDW: 13.7 % (ref 11.5–15.5)
WBC: 10.9 10*3/uL — ABNORMAL HIGH (ref 4.0–10.5)

## 2011-01-23 LAB — COMPREHENSIVE METABOLIC PANEL
AST: 19 U/L (ref 0–37)
Albumin: 3.9 g/dL (ref 3.5–5.2)
Chloride: 102 mEq/L (ref 96–112)
Creatinine, Ser: 0.55 mg/dL (ref 0.50–1.10)
Potassium: 4.3 mEq/L (ref 3.5–5.1)
Total Bilirubin: 0.3 mg/dL (ref 0.3–1.2)

## 2011-01-23 LAB — URINALYSIS, ROUTINE W REFLEX MICROSCOPIC
Glucose, UA: NEGATIVE mg/dL
pH: 5.5 (ref 5.0–8.0)

## 2011-01-23 LAB — URINE MICROSCOPIC-ADD ON

## 2011-01-23 LAB — DIFFERENTIAL
Basophils Absolute: 0 10*3/uL (ref 0.0–0.1)
Eosinophils Absolute: 0.1 10*3/uL (ref 0.0–0.7)
Eosinophils Relative: 1 % (ref 0–5)

## 2011-01-28 ENCOUNTER — Ambulatory Visit: Payer: Self-pay | Admitting: Family Medicine

## 2011-02-17 LAB — POCT URINALYSIS DIP (DEVICE)
Bilirubin Urine: NEGATIVE
Glucose, UA: NEGATIVE
Ketones, ur: NEGATIVE
Nitrite: NEGATIVE
Operator id: 282151
pH: 6

## 2011-02-17 LAB — COMPREHENSIVE METABOLIC PANEL
BUN: 16
CO2: 27
Calcium: 9.4
Chloride: 101
Creatinine, Ser: 0.67
GFR calc Af Amer: >60
GFR calc non Af Amer: >60
Total Bilirubin: 0.5

## 2011-02-17 LAB — LIPASE, BLOOD: Lipase: 24

## 2011-02-17 LAB — DIFFERENTIAL
Basophils Absolute: 0
Lymphocytes Relative: 29
Lymphs Abs: 2.7
Neutro Abs: 6

## 2011-02-17 LAB — CBC
HCT: 40.1
MCHC: 34.6
MCV: 92.4
Platelets: 237
RBC: 4.34
WBC: 9.6

## 2011-03-05 LAB — CBC
HCT: 29.8 — ABNORMAL LOW
Hemoglobin: 12.7
MCV: 94.1
Platelets: 177
RDW: 14.3 — ABNORMAL HIGH
RDW: 14.6 — ABNORMAL HIGH
WBC: 9.1

## 2011-03-05 LAB — CREATININE CLEARANCE, URINE, 24 HOUR
Creatinine Clearance: 137 — ABNORMAL HIGH
Creatinine, Urine: 25.2
Creatinine: 0.58

## 2011-03-05 LAB — PROTEIN, URINE, 24 HOUR: Urine Total Volume-UPROT: 4550

## 2011-03-05 LAB — POCT URINALYSIS DIP (DEVICE)
Bilirubin Urine: NEGATIVE
Glucose, UA: NEGATIVE
Nitrite: NEGATIVE
Operator id: 200901
pH: 6.5

## 2011-03-05 LAB — COMPREHENSIVE METABOLIC PANEL
ALT: 16
Albumin: 2.3 — ABNORMAL LOW
Alkaline Phosphatase: 115
Chloride: 108
Glucose, Bld: 175 — ABNORMAL HIGH
Potassium: 4.5
Sodium: 136
Total Protein: 6

## 2011-03-05 LAB — LACTATE DEHYDROGENASE: LDH: 101

## 2011-03-08 LAB — POCT URINALYSIS DIP (DEVICE)
Bilirubin Urine: NEGATIVE
Bilirubin Urine: NEGATIVE
Glucose, UA: NEGATIVE
Hgb urine dipstick: NEGATIVE
Hgb urine dipstick: NEGATIVE
Hgb urine dipstick: NEGATIVE
Ketones, ur: NEGATIVE
Nitrite: NEGATIVE
Nitrite: NEGATIVE
Nitrite: NEGATIVE
Operator id: 194561
Specific Gravity, Urine: 1.015
Urobilinogen, UA: 0.2
Urobilinogen, UA: 0.2
pH: 6.5
pH: 7
pH: 6

## 2011-03-08 LAB — URINALYSIS, ROUTINE W REFLEX MICROSCOPIC
Bilirubin Urine: NEGATIVE
Glucose, UA: NEGATIVE
Glucose, UA: NEGATIVE
Hgb urine dipstick: NEGATIVE
Hgb urine dipstick: NEGATIVE
Protein, ur: NEGATIVE
Specific Gravity, Urine: <1.005 — ABNORMAL LOW
Specific Gravity, Urine: <1.005 — ABNORMAL LOW
Urobilinogen, UA: 0.2
pH: 6.5

## 2011-03-09 ENCOUNTER — Encounter: Payer: Self-pay | Admitting: Family Medicine

## 2011-03-09 LAB — POCT URINALYSIS DIP (DEVICE)
Bilirubin Urine: NEGATIVE
Glucose, UA: NEGATIVE
Ketones, ur: NEGATIVE
Operator id: 135281
Specific Gravity, Urine: 1.025

## 2011-03-22 ENCOUNTER — Other Ambulatory Visit (HOSPITAL_COMMUNITY)
Admission: RE | Admit: 2011-03-22 | Discharge: 2011-03-22 | Disposition: A | Discharge status: Home / Self Care | Payer: Self-pay | Source: Ambulatory Visit | Attending: Family Medicine | Admitting: Family Medicine

## 2011-03-22 ENCOUNTER — Ambulatory Visit (INDEPENDENT_AMBULATORY_CARE_PROVIDER_SITE_OTHER): Payer: Self-pay | Admitting: Family Medicine

## 2011-03-22 ENCOUNTER — Encounter: Payer: Self-pay | Admitting: Family Medicine

## 2011-03-22 DIAGNOSIS — Z124 Encounter for screening for malignant neoplasm of cervix: Secondary | ICD-10-CM

## 2011-03-22 DIAGNOSIS — Z01419 Encounter for gynecological examination (general) (routine) without abnormal findings: Secondary | ICD-10-CM | POA: Insufficient documentation

## 2011-03-22 DIAGNOSIS — M79609 Pain in unspecified limb: Secondary | ICD-10-CM

## 2011-03-22 DIAGNOSIS — M7989 Other specified soft tissue disorders: Secondary | ICD-10-CM

## 2011-03-22 DIAGNOSIS — M79669 Pain in unspecified lower leg: Secondary | ICD-10-CM

## 2011-03-22 DIAGNOSIS — N760 Acute vaginitis: Secondary | ICD-10-CM

## 2011-03-22 DIAGNOSIS — R300 Dysuria: Secondary | ICD-10-CM

## 2011-03-22 LAB — POCT WET PREP (WET MOUNT)
Clue Cells Wet Prep HPF POC: NEGATIVE
Yeast Wet Prep HPF POC: NEGATIVE

## 2011-03-22 LAB — POCT URINALYSIS DIPSTICK
Bilirubin, UA: NEGATIVE
Blood, UA: NEGATIVE
Glucose, UA: NEGATIVE
Nitrite, UA: NEGATIVE
Spec Grav, UA: 1.02

## 2011-03-22 NOTE — Progress Notes (Signed)
  Subjective:    Patient ID: [name], female    DOB: November 08, 1980, 30 y.o.   MRN: [id number]  HPI 1.  Vaginal bleeding:  Heavy bleeding per patient, describes multiple clots being passed per day during menses  Changing a pad every hour.  LMP 03/12/11.  Lasts about 7 days, regular every month.  Last episode of bleeding was 10/25.  No further periods since that time. Denies any lightheadedness, palpitations, chest pain, shortness of breath. Not taking anything for contraception as she has history of bilateral tubal ligation.  2.  Dysuria:  Hx/o pyelonephritis in past.  Describes some burning when she urinates as well as increased frequency. This has been going on for the past 4 days. No back pain fevers or chills. No nausea or vomiting.  3.  LLE pain and swelling:  Patient has had some right calf swelling and pain for the past 3-4 weeks. She denies any trauma or recent injuries. Pain is mostly right popliteal region. Has had no shortness of breath. No redness or erythema.  Review of Systems See HPI above for review of systems.       Objective:   Physical Exam Gen:  Alert, cooperative patient who appears stated age in no acute distress.  Vital signs reviewed. HEENT:  [place]/AT.  EOMI, PERRL.  MMM, tonsils non-erythematous, non-edematous.  External ears WNL, Bilateral TM's normal without retraction, redness or bulging.  Cardiac:  Regular rate and rhythm without murmur auscultated.  Good S1/S2. Pulm:  Clear to auscultation bilaterally with good air movement.  No wheezes or rales noted.   GYN:  External genitalia within normal limits.  Vaginal mucosa pink, moist, normal rugae.  Nonfriable cervix without lesions, no discharge or bleeding noted on speculum exam.  Bimanual exam revealed normal, nongravid uterus.  No cervical motion tenderness. No adnexal masses bilaterally.  Pap smear obtained Ext:  No clubbing/cyanosis/erythema BL.  Right calf measured 36 cm in circumference 5 cm below tibial  tubercle. Left calf measured 34 cm in circumference 5 cm below tibial tubercle.  Only minimal tenderness on palpation of right calf. Homans sign negative. No Baker's cyst palpated. No warmth or redness of calf.  Full active and passive range of motion right knee. Full active and passive range of motion in BL hips.           Assessment & Plan:

## 2011-03-23 ENCOUNTER — Encounter: Payer: Self-pay | Admitting: Family Medicine

## 2011-03-23 ENCOUNTER — Telehealth: Payer: Self-pay | Admitting: Family Medicine

## 2011-03-23 DIAGNOSIS — M79669 Pain in unspecified lower leg: Secondary | ICD-10-CM | POA: Insufficient documentation

## 2011-03-23 NOTE — Assessment & Plan Note (Signed)
Not a DVT based on history and physical. Will obtain d-dimer just to ensure this. Unclear etiology of her pain. She is to continue to use over-the-counter pain relievers for relief.   2 return if worsening or no improvement.

## 2011-03-23 NOTE — Telephone Encounter (Signed)
Spoke with patient and relayed results of test.  Patient very grateful and glad all was negative.

## 2011-03-23 NOTE — Telephone Encounter (Signed)
Called and had to leave message on answering machine for her to call us back.  Did state that everything is good news. Did this to allay her fears and she was very nervous yesterday. We will need to tell her that her d-dimer was negative.

## 2011-04-19 ENCOUNTER — Encounter (HOSPITAL_COMMUNITY): Payer: Self-pay | Admitting: *Deleted

## 2011-04-19 ENCOUNTER — Emergency Department (INDEPENDENT_AMBULATORY_CARE_PROVIDER_SITE_OTHER)
Admission: EM | Admit: 2011-04-19 | Discharge: 2011-04-19 | Disposition: A | Discharge status: Home / Self Care | Payer: Self-pay | Source: Home / Self Care | Attending: Family Medicine | Admitting: Family Medicine

## 2011-04-19 DIAGNOSIS — J069 Acute upper respiratory infection, unspecified: Secondary | ICD-10-CM

## 2011-04-19 DIAGNOSIS — M26609 Unspecified temporomandibular joint disorder, unspecified side: Secondary | ICD-10-CM

## 2011-04-19 MED ORDER — CETIRIZINE-PSEUDOEPHEDRINE ER 5-120 MG PO TB12: 1.0000 | ORAL_TABLET | Freq: Two times a day (BID) | ORAL | Status: DC

## 2011-04-19 MED ORDER — IBUPROFEN 800 MG PO TABS: 800.0000 mg | ORAL_TABLET | Freq: Three times a day (TID) | ORAL | Status: AC

## 2011-04-19 NOTE — ED Notes (Signed)
Pt c/o bilateral ear pain, pressure, and hearing a "roaring" noise x 1 week. Reports yellow d/c from RIGHT ear "a few days ago".

## 2011-04-19 NOTE — ED Provider Notes (Signed)
History     CSN: [id number] Arrival date & time: 04/19/2011  2:23 PM   First MD Initiated Contact with Patient 04/19/11 1436      Chief Complaint  Patient presents with  . Otalgia    (Consider location/radiation/quality/duration/timing/severity/associated sxs/prior treatment) Patient is a 30 y.o. female presenting with ear pain. The history is provided by the patient.  Otalgia This is a new problem. The current episode started more than 2 days ago. There is pain in both ears. The problem occurs constantly. The problem has not changed since onset.There has been no fever. The pain is mild. Associated symptoms include ear discharge, rhinorrhea and sore throat.    No past medical history on file.  No past surgical history on file.  No family history on file.  History  Substance Use Topics  . Smoking status: Never Smoker   . Smokeless tobacco: Never Used  . Alcohol Use: Yes     Social Use    OB History    Grav Para Term Preterm Abortions TAB SAB Ect Mult Living                  Review of Systems  Constitutional: Negative.   HENT: Positive for ear pain, congestion, sore throat, rhinorrhea, postnasal drip and ear discharge.     Allergies  Review of patient's allergies indicates no known allergies.  Home Medications   Current Outpatient Rx  Name Route Sig Dispense Refill  . POLYETHYLENE GLYCOL 3350 PO POWD Oral Take 17 g by mouth daily. Dissolved in 8 oz water. Disp q.s. 3 days       BP 118/78  Pulse 68  Temp(Src) 98.2 F (36.8 C) (Oral)  Resp 16  SpO2 99%  Physical Exam  Nursing note and vitals reviewed. Constitutional: She appears well-developed and well-nourished.  HENT:  Head: Normocephalic.  Right Ear: External ear normal. Tympanic membrane is not injected, not perforated and not erythematous.  Left Ear: External ear normal. Tympanic membrane is not injected, not perforated and not erythematous.  Nose: Nose normal.  Mouth/Throat: Oropharynx is clear  and moist and mucous membranes are normal.       tmj soreness bilat  Eyes: Conjunctivae and EOM are normal. Pupils are equal, round, and reactive to light.  Neck: Normal range of motion. Neck supple.  Cardiovascular: Normal rate and normal heart sounds.   Pulmonary/Chest: Effort normal and breath sounds normal.    ED Course  Procedures (including critical care time)  Labs Reviewed - No data to display No results found.   No diagnosis found.    MDM          Barkley Bruns, MD 04/19/11 [phone]

## 2011-06-10 ENCOUNTER — Encounter (HOSPITAL_COMMUNITY): Payer: Self-pay | Admitting: Emergency Medicine

## 2011-06-10 ENCOUNTER — Emergency Department (INDEPENDENT_AMBULATORY_CARE_PROVIDER_SITE_OTHER)
Admission: EM | Admit: 2011-06-10 | Discharge: 2011-06-10 | Disposition: A | Discharge status: Home / Self Care | Payer: Self-pay | Source: Home / Self Care | Attending: Emergency Medicine | Admitting: Emergency Medicine

## 2011-06-10 DIAGNOSIS — J329 Chronic sinusitis, unspecified: Secondary | ICD-10-CM

## 2011-06-10 MED ORDER — ACETAMINOPHEN-CODEINE #3 300-30 MG PO TABS: 1.0000 | ORAL_TABLET | Freq: Four times a day (QID) | ORAL | Status: AC | PRN

## 2011-06-10 NOTE — ED Provider Notes (Signed)
History     CSN: [id number]  Arrival date & time 06/10/11  1140   First MD Initiated Contact with Patient 06/10/11 1328      Chief Complaint  Patient presents with  . Sore Throat    (Consider location/radiation/quality/duration/timing/severity/associated sxs/prior treatment) HPI Comments: A sore throat and post nasal dripping since yesterday " feel pressure in my sins uses" (points to both maxillary and frontal regions)   Patient is a 31 y.o. female presenting with pharyngitis. The history is provided by the patient.  Sore Throat This is a new problem. The current episode started yesterday. The problem occurs constantly. The problem has not changed since onset.Associated symptoms include headaches. Pertinent negatives include no chest pain and no shortness of breath. The symptoms are aggravated by swallowing. The symptoms are relieved by nothing. She has tried nothing for the symptoms.    History reviewed. No pertinent past medical history.  History reviewed. No pertinent past surgical history.  History reviewed. No pertinent family history.  History  Substance Use Topics  . Smoking status: Never Smoker   . Smokeless tobacco: Never Used  . Alcohol Use: Yes     Social Use    OB History    Grav Para Term Preterm Abortions TAB SAB Ect Mult Living                  Review of Systems  Constitutional: Positive for appetite change. Negative for fever.  HENT: Positive for ear pain, congestion, sore throat, rhinorrhea, postnasal drip and sinus pressure. Negative for facial swelling, neck pain, neck stiffness and ear discharge.   Eyes: Negative for itching.  Respiratory: Negative for shortness of breath.   Cardiovascular: Negative for chest pain.  Genitourinary: Negative for dysuria.  Skin: Positive for rash.  Neurological: Positive for headaches.    Allergies  Review of patient's allergies indicates no known allergies.  Home Medications   Current Outpatient Rx  Name  Route Sig Dispense Refill  . ACETAMINOPHEN-CODEINE #3 300-30 MG PO TABS Oral Take 1-2 tablets by mouth every 6 (six) hours as needed for pain. 15 tablet 0  . CETIRIZINE-PSEUDOEPHEDRINE ER 5-120 MG PO TB12 Oral Take 1 tablet by mouth 2 (two) times daily. 30 tablet 0  . POLYETHYLENE GLYCOL 3350 PO POWD Oral Take 17 g by mouth daily. Dissolved in 8 oz water. Disp q.s. 3 days       BP 107/63  Pulse 70  Temp(Src) 99.6 F (37.6 C) (Oral)  Resp 18  SpO2 100%  LMP 05/17/2011  Physical Exam  Nursing note and vitals reviewed. Constitutional: She appears well-developed and well-nourished. No distress.  HENT:  Head: Atraumatic.  Right Ear: Tympanic membrane normal.  Left Ear: Tympanic membrane normal.  Nose: Rhinorrhea present.  Mouth/Throat: Uvula is midline. Posterior oropharyngeal erythema present.  Eyes: Conjunctivae are normal. Right eye exhibits no discharge. Left eye exhibits no discharge.  Neck: Neck supple. No erythema present. No thyromegaly present.  Cardiovascular: Normal rate.   No murmur heard. Pulmonary/Chest: Effort normal and breath sounds normal. No respiratory distress. She has no decreased breath sounds. She has no wheezes. She has no rales. She exhibits no tenderness.  Lymphadenopathy:    She has no cervical adenopathy.  Neurological: She is alert.  Skin: Skin is warm. No rash noted. No erythema.    ED Course  Procedures (including critical care time)  Labs Reviewed - No data to display No results found.   1. Sinusitis  MDM  Sinus congestion with URI less than 24 hours        Jimmie Molly, MD 06/10/11 1642

## 2011-06-10 NOTE — ED Notes (Signed)
Pt c/o sore throat and sinus pressure and headache for one day.

## 2011-07-09 ENCOUNTER — Telehealth: Payer: Self-pay | Admitting: Family Medicine

## 2011-07-09 ENCOUNTER — Emergency Department (INDEPENDENT_AMBULATORY_CARE_PROVIDER_SITE_OTHER)
Admission: EM | Admit: 2011-07-09 | Discharge: 2011-07-09 | Disposition: A | Discharge status: Home / Self Care | Payer: Self-pay | Source: Home / Self Care

## 2011-07-09 ENCOUNTER — Encounter (HOSPITAL_COMMUNITY): Payer: Self-pay | Admitting: Cardiology

## 2011-07-09 DIAGNOSIS — J020 Streptococcal pharyngitis: Secondary | ICD-10-CM

## 2011-07-09 HISTORY — DX: Personal history of gestational diabetes: Z86.32

## 2011-07-09 LAB — POCT RAPID STREP A: Streptococcus, Group A Screen (Direct): POSITIVE — AB

## 2011-07-09 MED ORDER — AMOXICILLIN 500 MG PO CAPS: 500.0000 mg | ORAL_CAPSULE | Freq: Three times a day (TID) | ORAL | Status: DC

## 2011-07-09 NOTE — Discharge Instructions (Signed)
Tylenol or Ibuprofen as needed for fever or discomfort. Increase fluids and rest. Do not share drinks, eating utensils or kiss until you have been on the antibiotics for at least 24 hrs.   Amigdalitis estreptoccica (Strep Throat) La amigdalitis estreptoccica es una infeccin en la garganta. Es causada por un grmen. La angina estreptocccica se contagia de persona a persona por la tos, el estornudo o por contacto cercano. CUIDADOS EN EL HOGAR  Haga grgaras con 1 cucharadita de sal en 1 taza de agua tibia. Repita tres o cuatro veces por da, o cuando lo necesite.   Los miembros de la familia que presenten dolor de garganta o fiebre deben concurrir al mdico.   Asegrese de que todas las personas de su casa se lavan bien las manos.   No comparta alimentos, tazas o utensilios personales.   Coma alimentos blandos hasta que el dolor de garganta mejore.   Beba gran cantidad de lquido para mantener la orina de tono claro o color amarillo plido.   Haga reposo   No concurra a la escuela o la trabajo hasta que haya tomado los medicamentos durante 24 horas.   Tome slo la medicacin segn le haya indicado el mdico.   Tome los medicamentos tal como se le indic. Finalice la prescripcin completa, aunque se sienta mejor.  SOLICITE AYUDA DE INMEDIATO SI:  Aparecen sntomas nuevos como vmitos o fuertes dolores de [place].   Si siente el cuello rgido o le duele, tiene dolor en el pecho, problemas para respirar o para tragar.   Presenta dolor de garganta intenso, babeo o cambios en la voz.   El cuello se inflama (se hincha) o est rojo y le duele.   Tiene fiebre.   Se siente muy cansado, se le seca la boca, u orina menos que lo normal.   No puede despertarse bien.   Aparece una erupcin cutnea, tiene tos o dolor de odos.   Tiene un catarro verde, amarillo amarronado o con [place].   El dolor no mejora con los medicamentos prescriptos.  EST SEGURO QUE:   Comprende las  instrucciones para el alta mdica.   Controlar su enfermedad.   Solicitar atencin mdica de inmediato segn las indicaciones.  Document Released: 08/06/2008 Document Revised: 01/20/2011 [facility] Patient Information 2012 [place], [place].

## 2011-07-09 NOTE — Telephone Encounter (Signed)
Patient is calling with sore throat, fever and chills and trouble swallowing.  She used a light to look at her throat and saw white spots.  We do not have any openings or double books left for today and she needs direction.

## 2011-07-09 NOTE — Telephone Encounter (Signed)
Called again and person answering phone states patient has left to go to the doctor.   I asked if she was going to Urgent Care and was told yes. She does not have appointment scheduled  here.

## 2011-07-09 NOTE — ED Provider Notes (Signed)
History     CSN: [id number]  Arrival date & time 07/09/11  1012   None     Chief Complaint  Patient presents with  . Sore Throat  . Rash  . Fever  . Generalized Body Aches    (Consider location/radiation/quality/duration/timing/severity/associated sxs/prior treatment) HPI Comments: Patient presents with complaints of sore throat. Two days ago she began with a sore scratchy throat and an itchy rash. She thought that this may be due to an allergy and took Zyrtec. The rash did improve. Sore throat symptoms though have progressively worsened. Her throat is more painful with swallowing. Fever began yesterday. She denies nasal congestion or cough. She also c/o suprasternal chest discomfort x 2 days "worse when I go outside in the cold air." No radiation, or dyspnea. Pain does not change with movement or position.   Past Medical History  Diagnosis Date  . Hx gestational diabetes     Past Surgical History  Procedure Date  . Nasal sinus surgery 2003    Family History  Problem Relation Age of Onset  . Hypertension Mother   . Hyperlipidemia Mother   . Diabetes Other     History  Substance Use Topics  . Smoking status: Never Smoker   . Smokeless tobacco: Never Used  . Alcohol Use: Yes     Social Use    OB History    Grav Para Term Preterm Abortions TAB SAB Ect Mult Living                  Review of Systems  Constitutional: Positive for fever and chills. Negative for fatigue.  HENT: Positive for sore throat. Negative for ear pain, rhinorrhea and sinus pressure.   Respiratory: Negative for cough, shortness of breath and wheezing.   Cardiovascular: Negative for chest pain.  Skin: Positive for rash.    Allergies  Review of patient's allergies indicates no known allergies.  Home Medications   Current Outpatient Rx  Name Route Sig Dispense Refill  . AMOXICILLIN 500 MG PO CAPS Oral Take 1 capsule (500 mg total) by mouth 3 (three) times daily. 30 capsule 0  .  CETIRIZINE-PSEUDOEPHEDRINE ER 5-120 MG PO TB12 Oral Take 1 tablet by mouth 2 (two) times daily. 30 tablet 0  . POLYETHYLENE GLYCOL 3350 PO POWD Oral Take 17 g by mouth daily. Dissolved in 8 oz water. Disp q.s. 3 days       BP 115/75  Pulse 99  Temp(Src) 99.7 F (37.6 C) (Oral)  Resp 18  SpO2 98%  LMP 06/18/2011  Physical Exam  Nursing note and vitals reviewed. Constitutional: She appears well-developed and well-nourished. No distress.  HENT:  Head: Normocephalic and atraumatic.  Right Ear: Tympanic membrane, external ear and ear canal normal.  Left Ear: Tympanic membrane, external ear and ear canal normal.  Nose: Nose normal.  Mouth/Throat: Uvula is midline and mucous membranes are normal. Posterior oropharyngeal erythema (oropharynx very erythematous) present. No oropharyngeal exudate or posterior oropharyngeal edema.  Neck: Neck supple.  Cardiovascular: Normal rate, regular rhythm and normal heart sounds.   Pulmonary/Chest: Effort normal and breath sounds normal. No respiratory distress. She exhibits bony tenderness (mild suprasternal TTP).    Lymphadenopathy:    She has no cervical adenopathy.  Neurological: She is alert.  Skin: Skin is warm and dry.  Psychiatric: She has a normal mood and affect.    ED Course  Procedures (including critical care time)  Labs Reviewed  POCT RAPID STREP A ([facility]RG CARE ONLY) -  Abnormal; Notable for the following:    Streptococcus, Group A Screen (Direct) POSITIVE (*)    All other components within normal limits   No results found.   1. Acute streptococcal pharyngitis       MDM  Rapid strep pos. Mild sternal chest tenderness. No injury.         Melody Comas, [place] 07/09/11 1124

## 2011-07-09 NOTE — Telephone Encounter (Signed)
Message left to return call.

## 2011-07-09 NOTE — ED Notes (Signed)
Pt reports sore throat that started Tuesday and full body fine rash and itching starting that day. Fever started yesterday and today.  Decreased intake. Pain middle of anterior and posterior chest. Bilat ear pain. Tightness in neck.

## 2011-07-09 NOTE — ED Provider Notes (Signed)
Medical screening examination/treatment/procedure(s) were performed by non-physician practitioner and as supervising physician I was immediately available for consultation/collaboration.  Luiz Blare MD   Luiz Blare, MD 07/09/11 2124

## 2011-07-11 ENCOUNTER — Telehealth (HOSPITAL_COMMUNITY): Payer: Self-pay | Admitting: Family Medicine

## 2011-07-11 NOTE — ED Notes (Signed)
Patient called stating she had an allergic reaction to amoxicillin.  Patient states she took 2 doses on Friday.  She expierences mild swelling and large patchy rash after first dose.  Symptoms increased after second dose.  Patient did not take any doses yesterday.  Patient denise any current facial swelling or SOB.  Chart reviewed by Dr Juanetta Gosling.  RX for Z-pack called into [facility] on [address] by Ivonne Andrew RN.  Amoxicillin added as allergy.

## 2011-12-14 ENCOUNTER — Encounter (HOSPITAL_COMMUNITY): Payer: Self-pay

## 2011-12-14 ENCOUNTER — Emergency Department (HOSPITAL_COMMUNITY)
Admission: EM | Admit: 2011-12-14 | Discharge: 2011-12-14 | Disposition: A | Discharge status: Home / Self Care | Payer: Self-pay | Attending: Emergency Medicine | Admitting: Emergency Medicine

## 2011-12-14 DIAGNOSIS — R10811 Right upper quadrant abdominal tenderness: Secondary | ICD-10-CM | POA: Insufficient documentation

## 2011-12-14 DIAGNOSIS — R42 Dizziness and giddiness: Secondary | ICD-10-CM | POA: Insufficient documentation

## 2011-12-14 DIAGNOSIS — R109 Unspecified abdominal pain: Secondary | ICD-10-CM

## 2011-12-14 DIAGNOSIS — R110 Nausea: Secondary | ICD-10-CM | POA: Insufficient documentation

## 2011-12-14 DIAGNOSIS — E860 Dehydration: Secondary | ICD-10-CM

## 2011-12-14 LAB — URINALYSIS, ROUTINE W REFLEX MICROSCOPIC
Bilirubin Urine: NEGATIVE
Glucose, UA: NEGATIVE mg/dL
Hgb urine dipstick: NEGATIVE
Ketones, ur: NEGATIVE mg/dL
Nitrite: NEGATIVE
Protein, ur: NEGATIVE mg/dL
Specific Gravity, Urine: 1.008 (ref 1.005–1.030)
Urobilinogen, UA: 0.2 mg/dL (ref 0.0–1.0)
pH: 6 (ref 5.0–8.0)

## 2011-12-14 LAB — CBC WITH DIFFERENTIAL/PLATELET
Basophils Absolute: 0 K/uL (ref 0.0–0.1)
Basophils Relative: 0 % (ref 0–1)
Eosinophils Absolute: 0.1 K/uL (ref 0.0–0.7)
Eosinophils Relative: 2 % (ref 0–5)
HCT: 38.9 % (ref 36.0–46.0)
Hemoglobin: 13.4 g/dL (ref 12.0–15.0)
Lymphocytes Relative: 32 % (ref 12–46)
Lymphs Abs: 2.4 K/uL (ref 0.7–4.0)
MCH: 31.7 pg (ref 26.0–34.0)
MCHC: 34.4 g/dL (ref 30.0–36.0)
MCV: 92 fL (ref 78.0–100.0)
Monocytes Absolute: 0.5 10*3/uL (ref 0.1–1.0)
Monocytes Relative: 6 % (ref 3–12)
Neutro Abs: 4.4 10*3/uL (ref 1.7–7.7)
Neutrophils Relative %: 60 % (ref 43–77)
Platelets: 284 K/uL (ref 150–400)
RBC: 4.23 MIL/uL (ref 3.87–5.11)
RDW: 13.5 % (ref 11.5–15.5)
WBC: 7.4 K/uL (ref 4.0–10.5)

## 2011-12-14 LAB — HEPATIC FUNCTION PANEL
ALT: 26 U/L (ref 0–35)
AST: 25 U/L (ref 0–37)
Albumin: 3.8 g/dL (ref 3.5–5.2)
Alkaline Phosphatase: 87 U/L (ref 39–117)
Bilirubin, Direct: <0.1 mg/dL (ref 0.0–0.3)
Total Bilirubin: 0.2 mg/dL — ABNORMAL LOW (ref 0.3–1.2)
Total Protein: 8.1 g/dL (ref 6.0–8.3)

## 2011-12-14 LAB — POCT I-STAT, CHEM 8
BUN: 19 mg/dL (ref 6–23)
Creatinine, Ser: 0.7 mg/dL (ref 0.50–1.10)
Glucose, Bld: 134 mg/dL — ABNORMAL HIGH (ref 70–99)
Hemoglobin: 13.9 g/dL (ref 12.0–15.0)
Potassium: 4.5 mEq/L (ref 3.5–5.1)

## 2011-12-14 LAB — URINE MICROSCOPIC-ADD ON

## 2011-12-14 LAB — LIPASE, BLOOD: Lipase: 30 U/L (ref 11–59)

## 2011-12-14 MED ORDER — SODIUM CHLORIDE 0.9 % IV BOLUS (SEPSIS)
1000.0000 mL | Freq: Once | INTRAVENOUS | Status: AC
  Administered 2011-12-14: 1000 mL via INTRAVENOUS

## 2011-12-14 MED ORDER — PANTOPRAZOLE SODIUM 40 MG PO TBEC: 40.0000 mg | DELAYED_RELEASE_TABLET | Freq: Every day | ORAL | Status: DC

## 2011-12-14 MED ORDER — KETOROLAC TROMETHAMINE 15 MG/ML IJ SOLN
15.0000 mg | Freq: Once | INTRAMUSCULAR | Status: AC
  Administered 2011-12-14: 15 mg via INTRAVENOUS
  Filled 2011-12-14 (×2): qty 1

## 2011-12-14 MED ORDER — GI COCKTAIL ~~LOC~~
30.0000 mL | Freq: Once | ORAL | Status: AC
  Administered 2011-12-14: 30 mL via ORAL
  Filled 2011-12-14: qty 30

## 2011-12-14 NOTE — ED Notes (Signed)
Pt feels lightheaded and faint while at home, sts low bp at home, and just overall not feeling well. Feels sob and weak.

## 2011-12-14 NOTE — ED Provider Notes (Addendum)
History     CSN: [id number]  Arrival date & time 12/14/11  1252   First MD Initiated Contact with Patient 12/14/11 1817      Chief Complaint  Patient presents with  . Hypotension  . Dizziness    (Consider location/radiation/quality/duration/timing/severity/associated sxs/prior treatment) HPI Comments: Pt reports she has had issues with right flank pain in the past, worse after eating breakfast or greasy foods, this has been a problem for more than 1 year.  She has had U/S in the past for suspected stones, but never found.  She has felt dizzy and lightheaded, worse in AM's for past 3-4 days, no CP, palpitations, pleuritic pain, N/V/D.  Often worse in AM, worse after eating, but gradually will improve.  No focal weakness or numbness of limbs.  No fever, chills.  She is followed by [facility].    The history is provided by the patient, a relative and medical records.    Past Medical History  Diagnosis Date  . Hx gestational diabetes     Past Surgical History  Procedure Date  . Nasal sinus surgery 2003    Family History  Problem Relation Age of Onset  . Hypertension Mother   . Hyperlipidemia Mother   . Diabetes Other     History  Substance Use Topics  . Smoking status: Never Smoker   . Smokeless tobacco: Never Used  . Alcohol Use: Yes     Social Use    OB History    Grav Para Term Preterm Abortions TAB SAB Ect Mult Living                  Review of Systems  Constitutional: Negative for fever and chills.  Respiratory: Negative for cough, chest tightness, shortness of breath and wheezing.   Cardiovascular: Negative for chest pain and leg swelling.  Gastrointestinal: Positive for nausea and abdominal pain. Negative for vomiting, diarrhea, constipation and blood in stool.  Genitourinary: Positive for flank pain. Negative for dysuria and hematuria.  Musculoskeletal: Negative for back pain.  Neurological: Positive for dizziness and light-headedness.  Negative for syncope and headaches.  All other systems reviewed and are negative.    Allergies  Amoxicillin  Home Medications   Current Outpatient Rx  Name Route Sig Dispense Refill  . PANTOPRAZOLE SODIUM 40 MG PO TBEC Oral Take 1 tablet (40 mg total) by mouth daily. 14 tablet 0    BP 120/67  Pulse 67  Temp 98.6 F (37 C)  Resp 13  SpO2 97%  Physical Exam  Nursing note and vitals reviewed. Constitutional: She is oriented to person, place, and time. She appears well-developed and well-nourished. No distress.  HENT:  Head: Normocephalic and atraumatic.  Eyes: Pupils are equal, round, and reactive to light. No scleral icterus.  Neck: Normal range of motion. Neck supple.  Cardiovascular: Normal rate and regular rhythm.   Pulmonary/Chest: Effort normal. No respiratory distress. She has no wheezes.  Abdominal: Soft. Normal appearance and bowel sounds are normal. She exhibits no distension. There is tenderness in the right upper quadrant. There is no rebound, no guarding, no tenderness at McBurney's point and negative Murphy's sign.  Neurological: She is alert and oriented to person, place, and time.  Skin: Skin is warm. She is not diaphoretic.    ED Course  Procedures (including critical care time)  Labs Reviewed  URINALYSIS, ROUTINE W REFLEX MICROSCOPIC - Abnormal; Notable for the following:    APPearance HAZY (*)     Leukocytes,  UA SMALL (*)     All other components within normal limits  POCT I-STAT, CHEM 8 - Abnormal; Notable for the following:    Glucose, Bld 134 (*)     Calcium, Ion 1.26 (*)     All other components within normal limits  URINE MICROSCOPIC-ADD ON - Abnormal; Notable for the following:    Squamous Epithelial / LPF FEW (*)     Bacteria, UA MANY (*)     All other components within normal limits  HEPATIC FUNCTION PANEL - Abnormal; Notable for the following:    Total Bilirubin 0.2 (*)     All other components within normal limits  CBC WITH DIFFERENTIAL   POCT PREGNANCY, URINE  POCT I-STAT TROPONIN I  LIPASE, BLOOD   No results found.   1. Dehydration   2. Abdominal pain     8:17 PM Tolerating PO's, LFT and lipase is also normal.  Pt seen by [facility] who has arranged follow up for pt for Friday and I suggested GI referral, possibly HIDA scan as outpt.     ECG at time 1304 shows NSR at rate 81, normal axis, no ST or T wave abns, interpret as normal.  MDM  No clear UTI.  RUQ tender.  However she has had about 6 U/S in the past 6 years in [facility], all negative.  No HIDA that I can see.  Will check LFT's and lipase.  Otherwise HCG is neg, WBC is normal, not orthostatic, no CP or sweats or SOB.  Will discuss with [facility] if labs are ok.  Try GI cocktail here as well.          Gavin Pound. Oletta Lamas, MD 12/14/11 2019  Gavin Pound. Oletta Lamas, MD 01/17/12 [phone]

## 2011-12-14 NOTE — Progress Notes (Signed)
Family Medicine Teaching Service Note Service Pager: [phone]  Patient is a 31 yo F presenting to ED for RUQ abd pain and lightheadedness. Work-up in the ED unremarkable today. She has had multiple ultrasounds in the past for similar complaint which have been normal. She states her blood pressure has also been up/down, but it has been within normal limits in the ED (with the exception of one reading at triage which was 150/94.) ED provider requested the [facility] Medicine Teaching Service see patient prior to being discharged from the ED to arrange for close outpatient follow up, rather than as a consult.   Patient will follow up with Dr. Renold Don on 12/17/11 at 11:30am. At that time, consider checking BP, evaluating abdominal pain and possibly arranging outpatient HIDA scan. Patient encouraged to call [facility] in the future prior to coming to ED, if possible. She agrees with this plan.  Javares Kaufhold M. Karson Chicas, M.D. 12/14/2011 8:16 PM

## 2011-12-17 ENCOUNTER — Ambulatory Visit: Payer: Self-pay | Admitting: Family Medicine

## 2011-12-17 ENCOUNTER — Ambulatory Visit (INDEPENDENT_AMBULATORY_CARE_PROVIDER_SITE_OTHER): Payer: Self-pay | Admitting: Family Medicine

## 2011-12-17 ENCOUNTER — Encounter: Payer: Self-pay | Admitting: Family Medicine

## 2011-12-17 VITALS — BP 119/77 | HR 79 | Temp 97.7°F | Ht 62.0 in | Wt 150.7 lb

## 2011-12-17 DIAGNOSIS — R109 Unspecified abdominal pain: Secondary | ICD-10-CM

## 2011-12-17 DIAGNOSIS — R42 Dizziness and giddiness: Secondary | ICD-10-CM

## 2011-12-17 DIAGNOSIS — R1011 Right upper quadrant pain: Secondary | ICD-10-CM | POA: Insufficient documentation

## 2011-12-17 LAB — POCT URINALYSIS DIPSTICK
Bilirubin, UA: NEGATIVE
Glucose, UA: NEGATIVE
Nitrite, UA: NEGATIVE
Urobilinogen, UA: 0.2

## 2011-12-17 LAB — POCT UA - MICROSCOPIC ONLY

## 2011-12-17 LAB — POCT URINE PREGNANCY: Preg Test, Ur: NEGATIVE

## 2011-12-17 MED ORDER — MECLIZINE HCL 50 MG PO TABS: 50.0000 mg | ORAL_TABLET | Freq: Three times a day (TID) | ORAL | Status: AC | PRN

## 2011-12-17 NOTE — Progress Notes (Signed)
Patient ID: [name], female   DOB: 1981/02/28, 31 y.o.   MRN: [id number] [name] is a 31 y.o. female who presents to [facility] today for RUQ pain and dizziness:  1.  RUQ pain:  Chronic pain for patient, about 10 years in nature.  Has had multiple abdominal ultrasounds which have all been negative.  Increasing, worst after fatty foods or milk.  Pain mostly under Right ribs but also radiating to back and up to her shoulder.  Symptoms are getting worse currently, mostly pain and change in color of stools which she describes as initially green and now pale in color. No nausea or vomiting. She is eating l grilled chicken and tortillas as she is afraid to eat anything else because often that causes her pain.  Pain became severe enough on Tuesday that she presented to the emergency department. She was set up for an appointment here to follow for this pain. As noted in previous office visit notes she has never had a positive ultrasound. If has never seen a gastroenterologist and has never had a HIDA scan.  2.  Dizziness:  Described mostly as "room spinning around her."  Worse with movement of head, but can occur at rest.  This worsened after her pain worsened on Tuesday. This is a relatively new problem for the patient for the past several weeks. She denies any tinnitus or loss of hearing or ear pain. She has not had any falls. Sometimes occurs at night and she has to catch herself against the wall to steady herself. No headaches.   The following portions of the patient's history were reviewed and updated as appropriate: allergies, current medications, past medical history, family and social history, and problem list.  Patient is a nonsmoker.   ROS as above otherwise neg. No Chest pain, palpitations, SOB, Fever, Chills, Abd pain, N/V/D.  Medications reviewed. Current Outpatient Prescriptions  Medication Sig Dispense Refill  . pantoprazole (PROTONIX) 40 MG tablet Take 1 tablet (40 mg total) by  mouth daily.  14 tablet  0    Exam:  BP 119/77  Pulse 79  Temp 97.7 F (36.5 C) (Oral)  Ht 5\' 2"  (1.575 m)  Wt 150 lb 11.2 oz (68.357 kg)  BMI 27.56 kg/m2  LMP 11/27/2011 Gen: Well NAD HEENT: EOMI,  MMM Lungs: CTABL Nl WOB Heart: RRR with very faint murmur noted.   Abd: Soft and nondistended. Tender to palpation in the right upper quadrant. Positive Murphy's sign. Also tender along the right ribs mid axillary line, T8 - T 10. Exts: Non edematous BL  LE, warm and well perfused. Good bowel sounds throughout.  Results for orders placed during the hospital encounter of 12/14/11 (from the past 72 hour(s))  URINALYSIS, ROUTINE W REFLEX MICROSCOPIC     Status: Abnormal   Collection Time   12/14/11  1:18 PM      Component Value Range Comment   Color, Urine YELLOW  YELLOW    APPearance HAZY (*) CLEAR    Specific Gravity, Urine 1.008  1.005 - 1.030    pH 6.0  5.0 - 8.0    Glucose, UA NEGATIVE  NEGATIVE mg/dL    Hgb urine dipstick NEGATIVE  NEGATIVE    Bilirubin Urine NEGATIVE  NEGATIVE    Ketones, ur NEGATIVE  NEGATIVE mg/dL    Protein, ur NEGATIVE  NEGATIVE mg/dL    Urobilinogen, UA 0.2  0.0 - 1.0 mg/dL    Nitrite NEGATIVE  NEGATIVE    Leukocytes,  UA SMALL (*) NEGATIVE   URINE MICROSCOPIC-ADD ON     Status: Abnormal   Collection Time   12/14/11  1:18 PM      Component Value Range Comment   Squamous Epithelial / LPF FEW (*) RARE    WBC, UA 3-6  <3 WBC/hpf    Bacteria, UA MANY (*) RARE   CBC WITH DIFFERENTIAL     Status: Normal   Collection Time   12/14/11  1:26 PM      Component Value Range Comment   WBC 7.4  4.0 - 10.5 K/uL    RBC 4.23  3.87 - 5.11 MIL/uL    Hemoglobin 13.4  12.0 - 15.0 g/dL    HCT 16.1  09.6 - 04.5 %    MCV 92.0  78.0 - 100.0 fL    MCH 31.7  26.0 - 34.0 pg    MCHC 34.4  30.0 - 36.0 g/dL    RDW 40.9  81.1 - 91.4 %    Platelets 284  150 - 400 K/uL    Neutrophils Relative 60  43 - 77 %    Neutro Abs 4.4  1.7 - 7.7 K/uL    Lymphocytes Relative 32  12 - 46 %     Lymphs Abs 2.4  0.7 - 4.0 K/uL    Monocytes Relative 6  3 - 12 %    Monocytes Absolute 0.5  0.1 - 1.0 K/uL    Eosinophils Relative 2  0 - 5 %    Eosinophils Absolute 0.1  0.0 - 0.7 K/uL    Basophils Relative 0  0 - 1 %    Basophils Absolute 0.0  0.0 - 0.1 K/uL   POCT PREGNANCY, URINE     Status: Normal   Collection Time   12/14/11  1:42 PM      Component Value Range Comment   Preg Test, Ur NEGATIVE  NEGATIVE   POCT I-STAT TROPONIN I     Status: Normal   Collection Time   12/14/11  1:46 PM      Component Value Range Comment   Troponin i, poc 0.00  0.00 - 0.08 ng/mL    Comment 3            POCT I-STAT, CHEM 8     Status: Abnormal   Collection Time   12/14/11  1:49 PM      Component Value Range Comment   Sodium 140  135 - 145 mEq/L    Potassium 4.5  3.5 - 5.1 mEq/L    Chloride 105  96 - 112 mEq/L    BUN 19  6 - 23 mg/dL    Creatinine, Ser 7.82  0.50 - 1.10 mg/dL    Glucose, Bld 956 (*) 70 - 99 mg/dL    Calcium, Ion 2.13 (*) 1.12 - 1.23 mmol/L    TCO2 25  0 - 100 mmol/L    Hemoglobin 13.9  12.0 - 15.0 g/dL    HCT 08.6  57.8 - 46.9 %   LIPASE, BLOOD     Status: Normal   Collection Time   12/14/11  6:47 PM      Component Value Range Comment   Lipase 30  11 - 59 U/L   HEPATIC FUNCTION PANEL     Status: Abnormal   Collection Time   12/14/11  6:47 PM      Component Value Range Comment   Total Protein 8.1  6.0 - 8.3 g/dL    Albumin 3.8  3.5 - 5.2 g/dL    AST 25  0 - 37 U/L HEMOLYSIS AT THIS LEVEL MAY AFFECT RESULT   ALT 26  0 - 35 U/L    Alkaline Phosphatase 87  39 - 117 U/L    Total Bilirubin 0.2 (*) 0.3 - 1.2 mg/dL    Bilirubin, Direct <4.0  0.0 - 0.3 mg/dL    Indirect Bilirubin NOT CALCULATED  0.3 - 0.9 mg/dL

## 2011-12-17 NOTE — Assessment & Plan Note (Signed)
No red flags. Likely related to be BPPV rather than related to right upper quadrant pain. Meclizine for relief. Followup in about 2 weeks to reassess for improvement. I provided the patient with explicit warnings and red flags that would prompt return to clinic or the ED.

## 2011-12-17 NOTE — Patient Instructions (Signed)
Try 3-4 tablets of the over the counter Ibuprofen for pain relief. I will refer you to surgery. We are doing labs today too and I will let you know on Monday what they show.    Only baked and grilled chicken.  No fried foods. Lots of fruits and vegetables.  Potatoes only without butter or sour cream.    Try Meclizine for the dizziness.

## 2011-12-17 NOTE — Assessment & Plan Note (Signed)
Long-standing problem for patient. 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 Worse after eating fatty foods. Plan to recommend her to surgery today. May need HIDA scan but the patient is without insurance and therefore gastroenterology will not see her. Ibuprofen for pain relief in meantime. Repeat labs today. I provided the patient with explicit warnings and red flags that would prompt return to clinic or the ED.

## 2011-12-18 LAB — COMPREHENSIVE METABOLIC PANEL
ALT: 31 U/L (ref 0–35)
AST: 22 U/L (ref 0–37)
BUN: 14 mg/dL (ref 6–23)
Creat: 0.59 mg/dL (ref 0.50–1.10)
Total Bilirubin: 0.3 mg/dL (ref 0.3–1.2)

## 2011-12-18 LAB — LIPASE: Lipase: 21 U/L (ref 0–75)

## 2011-12-18 LAB — CBC
HCT: 41.2 % (ref 36.0–46.0)
Hemoglobin: 13.9 g/dL (ref 12.0–15.0)
MCHC: 33.7 g/dL (ref 30.0–36.0)
MCV: 91.6 fL (ref 78.0–100.0)
RDW: 14.2 % (ref 11.5–15.5)
WBC: 8 10*3/uL (ref 4.0–10.5)

## 2011-12-20 ENCOUNTER — Telehealth: Payer: Self-pay | Admitting: Family Medicine

## 2011-12-20 LAB — URINE CULTURE: Colony Count: >=100000

## 2011-12-20 MED ORDER — CIPROFLOXACIN HCL 500 MG PO TABS: 500.0000 mg | ORAL_TABLET | Freq: Two times a day (BID) | ORAL | Status: AC

## 2011-12-20 NOTE — Telephone Encounter (Signed)
Patient is calling because she has not gotten the results from her labs on Friday.  She thought she would get a call today.

## 2011-12-20 NOTE — Telephone Encounter (Signed)
Called and reviewed labs with patient.  Klebsiella UTI noted.  Sensitive to Cipro.  Plan to treat outpatient as uncomplicated pyelonephritis as she is now with mild chills and some low back pain.  No fevers.  I provided the patient with explicit warnings and red flags that would prompt return to clinic or the ED regarding worsening pyelo.  Patient expressed understanding.     Surgery consult already placed.  Patient appreciative of call.  States green bowel movements have returned.

## 2012-01-27 ENCOUNTER — Ambulatory Visit: Payer: Self-pay | Admitting: Family Medicine

## 2012-01-31 ENCOUNTER — Encounter: Payer: Self-pay | Admitting: Family Medicine

## 2012-01-31 ENCOUNTER — Ambulatory Visit (INDEPENDENT_AMBULATORY_CARE_PROVIDER_SITE_OTHER): Payer: Self-pay | Admitting: Family Medicine

## 2012-01-31 VITALS — BP 129/69 | HR 75 | Temp 99.0°F | Ht 62.0 in | Wt 161.8 lb

## 2012-01-31 DIAGNOSIS — R61 Generalized hyperhidrosis: Secondary | ICD-10-CM

## 2012-01-31 DIAGNOSIS — R109 Unspecified abdominal pain: Secondary | ICD-10-CM

## 2012-01-31 DIAGNOSIS — Z862 Personal history of diseases of the blood and blood-forming organs and certain disorders involving the immune mechanism: Secondary | ICD-10-CM

## 2012-01-31 DIAGNOSIS — R319 Hematuria, unspecified: Secondary | ICD-10-CM

## 2012-01-31 DIAGNOSIS — Z8639 Personal history of other endocrine, nutritional and metabolic disease: Secondary | ICD-10-CM

## 2012-01-31 DIAGNOSIS — IMO0001 Reserved for inherently not codable concepts without codable children: Secondary | ICD-10-CM

## 2012-01-31 DIAGNOSIS — R1011 Right upper quadrant pain: Secondary | ICD-10-CM

## 2012-01-31 LAB — POCT URINALYSIS DIPSTICK
Blood, UA: NEGATIVE
Glucose, UA: NEGATIVE
Nitrite, UA: NEGATIVE
Protein, UA: NEGATIVE
Spec Grav, UA: <=1.005
Urobilinogen, UA: 0.2

## 2012-01-31 LAB — POCT URINE PREGNANCY: Preg Test, Ur: NEGATIVE

## 2012-01-31 NOTE — Patient Instructions (Addendum)
I have put in the order for the ultrasound of your belly. We will retest your thyroid today because of your symptoms.  We will also have you referred to the surgeon.    Your urine test looks good.

## 2012-01-31 NOTE — Progress Notes (Signed)
Patient ID: [name], female   DOB: 01-19-1981, 31 y.o.   MRN: [id number] [name] is a 31 y.o. female who presents to [facility] today for follow-up for RUQ pain and hematuria:    1.  RUQ pain:  Persists.  Worse after meals.  Not taking any pain medicine, does improve with position at times.  Has been changing diet and eating smaller meals, which has helped with her symptoms.  Chronic issue for her for several years.  Referral in the past to surgery, however she did not have [facility] at that time and therefore did not make appointment.  Desires referral today.  Nausea after meals.  No vomiting currently.  No fevers or chills.    2.  Hematuria:  Noted late last week.  Blood on toilet paper when she wiped.  Has not had any symptoms since then.  No dysuria.  No polyuria or hesitancy.  Occurred about 3 times that day.    3.  Heat intolerance:  Has noted this for past 2-3 weeks.  Feels sweating and heat in hands and feet.  States she is sweaty and "looks like I've been running but I have not."  Notes this occurs when everyone else in the room feels normal.  Denies palpitations.  States she has history of thyroid issues when she was pregnant.    The following portions of the patient's history were reviewed and updated as appropriate: allergies, current medications, past medical history, family and social history, and problem list.  Patient is a nonsmoker.  Past Medical History  Diagnosis Date  . Hx gestational diabetes     ROS as above otherwise neg. No Chest pain, palpitations, SOB, Fever, Chills, Abd pain, N/V/D.  Medications reviewed. Current Outpatient Prescriptions  Medication Sig Dispense Refill  . pantoprazole (PROTONIX) 40 MG tablet Take 1 tablet (40 mg total) by mouth daily.  14 tablet  0    Exam:  BP 129/69  Pulse 75  Temp 99 F (37.2 C) (Oral)  Ht 5\' 2"  (1.575 m)  Wt 161 lb 12.8 oz (73.392 kg)  BMI 29.59 kg/m2 Gen: Well NAD HEENT: EOMI,  MMM Lungs: CTABL Nl  WOB Heart: RRR with faint murmur noted.  Abd: Good bowel sounds.  Non distended.  RUQ tenderness noted with positive Murphy sign.  No other abdominal pain, no guarding or rebound.  Exts: Non edematous BL  LE, warm and well perfused.   No results found for this or any previous visit (from the past 72 hour(s)).

## 2012-02-02 ENCOUNTER — Telehealth: Payer: Self-pay | Admitting: Family Medicine

## 2012-02-02 ENCOUNTER — Ambulatory Visit (HOSPITAL_COMMUNITY)
Admission: RE | Admit: 2012-02-02 | Discharge: 2012-02-02 | Disposition: A | Discharge status: Home / Self Care | Payer: Self-pay | Source: Ambulatory Visit | Attending: Family Medicine | Admitting: Family Medicine

## 2012-02-02 DIAGNOSIS — R1011 Right upper quadrant pain: Secondary | ICD-10-CM | POA: Insufficient documentation

## 2012-02-02 DIAGNOSIS — R319 Hematuria, unspecified: Secondary | ICD-10-CM | POA: Insufficient documentation

## 2012-02-02 DIAGNOSIS — R6889 Other general symptoms and signs: Secondary | ICD-10-CM | POA: Insufficient documentation

## 2012-02-02 DIAGNOSIS — G8929 Other chronic pain: Secondary | ICD-10-CM | POA: Insufficient documentation

## 2012-02-02 DIAGNOSIS — K7689 Other specified diseases of liver: Secondary | ICD-10-CM | POA: Insufficient documentation

## 2012-02-02 HISTORY — DX: Right upper quadrant pain: R10.11

## 2012-02-02 NOTE — Telephone Encounter (Signed)
Patient is calling for her lab results.  

## 2012-02-02 NOTE — Telephone Encounter (Signed)
Patient wants US results?

## 2012-02-02 NOTE — Assessment & Plan Note (Signed)
Patient desires definitive treatment for this Checking liver function tests and RUQ today. Surgery referral already placed.

## 2012-02-02 NOTE — Assessment & Plan Note (Signed)
UA today negative. No further symptoms since last week.   Follow clinically, return if recurs.

## 2012-02-02 NOTE — Assessment & Plan Note (Signed)
Checking TSH today. Still having regular periods.  Less likely early ovarian failure.

## 2012-02-03 ENCOUNTER — Telehealth: Payer: Self-pay | Admitting: Family Medicine

## 2012-02-03 NOTE — Telephone Encounter (Signed)
Called and spoke with her this AM.  See other phone note

## 2012-02-03 NOTE — Telephone Encounter (Signed)
Called to discuss normal ultrasound results.  Patient still having colicky pain worse after meals.  Currently not in pain.  No trouble breathing or chest pain.  Recommended she still discuss with surgeons her options in light of normal LFTs and normal U/S with what sounds like typical gallbladder pain.  Question if she has passed a stone, although she has had history of normal ultrasound about 1 year ago.  Also discussed normal TSH with patient which we tested for heat intolerance.  Patient expressed understanding and appreciation of call.

## 2012-03-08 ENCOUNTER — Ambulatory Visit: Payer: Self-pay

## 2012-03-09 ENCOUNTER — Emergency Department (INDEPENDENT_AMBULATORY_CARE_PROVIDER_SITE_OTHER)
Admission: EM | Admit: 2012-03-09 | Discharge: 2012-03-09 | Disposition: A | Discharge status: Home / Self Care | Payer: Self-pay | Source: Home / Self Care

## 2012-03-09 ENCOUNTER — Encounter (HOSPITAL_COMMUNITY): Payer: Self-pay | Admitting: Emergency Medicine

## 2012-03-09 DIAGNOSIS — N9089 Other specified noninflammatory disorders of vulva and perineum: Secondary | ICD-10-CM

## 2012-03-09 DIAGNOSIS — B349 Viral infection, unspecified: Secondary | ICD-10-CM

## 2012-03-09 DIAGNOSIS — R1032 Left lower quadrant pain: Secondary | ICD-10-CM

## 2012-03-09 DIAGNOSIS — N907 Vulvar cyst: Secondary | ICD-10-CM

## 2012-03-09 DIAGNOSIS — J029 Acute pharyngitis, unspecified: Secondary | ICD-10-CM

## 2012-03-09 DIAGNOSIS — B9789 Other viral agents as the cause of diseases classified elsewhere: Secondary | ICD-10-CM

## 2012-03-09 HISTORY — DX: Type 2 diabetes mellitus without complications: E11.9

## 2012-03-09 MED ORDER — METRONIDAZOLE 500 MG PO TABS: 500.0000 mg | ORAL_TABLET | Freq: Two times a day (BID) | ORAL | Status: DC

## 2012-03-09 MED ORDER — DOXYCYCLINE HYCLATE 100 MG PO CAPS: 100.0000 mg | ORAL_CAPSULE | Freq: Two times a day (BID) | ORAL | Status: DC

## 2012-03-09 NOTE — ED Provider Notes (Signed)
History     CSN: [id number]  Arrival date & time 03/09/12  1836   None     Chief Complaint  Patient presents with  . Fever  . Headache  . Recurrent Skin Infections    (Consider location/radiation/quality/duration/timing/severity/associated sxs/prior treatment) HPI Comments: 31 year old female who presents with fever and sore throat for one to 2 days. She is also experiencing headache mild transient dizziness and general weakness. Other associated complaints are chest discomfort, occasional shortness of breath , sensation of increased heart rate, chills and lower left abdominal pain. She took her to young children to the doctor today with similar complaints of fever,sore throat and malaise. After testing for strep they were negative  and final diagnosis was a viral type syndrome. They are present with the mother and appeared to be quite healthy as they are eating and drawing and talking and laughing. Second complaint is that of a boil in the left labia. She's had this off and on for several weeks , it tends to become larger and then smaller. Today it is smaller and tender.Not visible.  Patient is a 31 y.o. female presenting with fever and headaches.  Fever Primary symptoms of the febrile illness include fever, headaches, shortness of breath and abdominal pain. Primary symptoms do not include nausea, vomiting, diarrhea or dysuria.  The headache is not associated with neck stiffness.  Headache The primary symptoms include headaches, dizziness and fever. Primary symptoms do not include seizures, nausea or vomiting.  The headache is not associated with neck stiffness.  Dizziness also occurs with diaphoresis. Dizziness does not occur with nausea or vomiting.   Additional symptoms do not include neck stiffness or hearing loss.    Past Medical History  Diagnosis Date  . Hx gestational diabetes   . Diabetes mellitus without complication     Past Surgical History  Procedure Date  .  Nasal sinus surgery 2003    Family History  Problem Relation Age of Onset  . Hypertension Mother   . Hyperlipidemia Mother   . Diabetes Other     History  Substance Use Topics  . Smoking status: Never Smoker   . Smokeless tobacco: Never Used  . Alcohol Use: Yes     Social Use    OB History    Grav Para Term Preterm Abortions TAB SAB Ect Mult Living                  Review of Systems  Constitutional: Positive for fever, chills and diaphoresis. Negative for appetite change.        malaise  HENT: Positive for ear pain, sore throat and neck pain. Negative for hearing loss, rhinorrhea, neck stiffness and ear discharge.   Respiratory: Positive for shortness of breath.   Cardiovascular: Positive for chest pain. Negative for palpitations and leg swelling.  Gastrointestinal: Positive for abdominal pain. Negative for nausea, vomiting, diarrhea, constipation and blood in stool.  Genitourinary: Negative for dysuria, frequency, flank pain, decreased urine volume, vaginal bleeding, menstrual problem and pelvic pain.  Skin: Negative.   Neurological: Positive for dizziness and headaches. Negative for tremors, seizures, syncope and speech difficulty.    Allergies  Amoxicillin and Penicillins  Home Medications   Current Outpatient Rx  Name Route Sig Dispense Refill  . DOXYCYCLINE HYCLATE 100 MG PO CAPS Oral Take 1 capsule (100 mg total) by mouth 2 (two) times daily. 20 capsule 0  . METRONIDAZOLE 500 MG PO TABS Oral Take 1 tablet (500 mg total) by mouth  2 (two) times daily. X 7 days 14 tablet 0  . PANTOPRAZOLE SODIUM 40 MG PO TBEC Oral Take 1 tablet (40 mg total) by mouth daily. 14 tablet 0    BP 122/74  Pulse 87  Temp 99.1 F (37.3 C) (Oral)  Resp 16  SpO2 100%  LMP 02/06/2012  Physical Exam  Constitutional: She is oriented to person, place, and time. She appears well-developed and well-nourished. No distress.  HENT:  Head: Normocephalic and atraumatic.  Right Ear: External  ear normal.  Left Ear: External ear normal.  Mouth/Throat: No oropharyngeal exudate.       Pharynx is erythematous, no overt exudates. Airway widely patent  Eyes: EOM are normal. Pupils are equal, round, and reactive to light. Left eye exhibits no discharge.  Neck: Normal range of motion. Neck supple.       Tenderness in the left lateral neck musculature. Turning the head to the right produces mild left lateral neck pain. No individual lymph nodes are palpable.  Cardiovascular: Normal rate and normal heart sounds.   Pulmonary/Chest: Effort normal and breath sounds normal. No respiratory distress. She has no wheezes. She has no rales.  Abdominal: Soft. There is tenderness. There is no rebound and no guarding.       There is mild tenderness in the left lower quadrant. No masses, no guarding or rebound.  Genitourinary: Vagina normal. No vaginal discharge found.       External inspection of the low vaginal area reveals no apparent lesions or other abnormalities. Palpation of the left labia majora does reveal a 1 cm deep tender or nodular. This feels solid and is not near the surface. Does not appear to be amenable to surgical treatment.  Musculoskeletal: Normal range of motion.  Lymphadenopathy:    She has no cervical adenopathy.  Neurological: She is alert and oriented to person, place, and time. No cranial nerve deficit.  Skin: Skin is warm and dry. No erythema.  Psychiatric: She has a normal mood and affect.    ED Course  Procedures (including critical care time)   Labs Reviewed  POCT RAPID STREP A ([facility] ONLY)   No results found.   1. Pharyngitis   2. Viral syndrome   3. Abdominal pain, LLQ (left lower quadrant)   4. Vulvar cyst       MDM  Do so it looks like the same viral syndrome it is going through the family. Patient is encouraged to drink plenty of clear liquids and stay well hydrated. She may take ibuprofen 400-600 mg every 6 hours when necessary fever aches or  pains. Doxycycline 100 twice a day for 10 days Flagyl 500 twice a day for 7 day I don't think she has an acute abdomen however on a certain as to what is causing the left lower quadrant abdominal discomfort. She has no other associated symptoms such as nausea vomiting diarrhea or constipation. She is advised to followup with her physician in the next few days if the abdominal pain persists and for evaluation of the papular lesion. He may return  Results for orders placed during the hospital encounter of 03/09/12  POCT RAPID STREP A ([facility] ONLY)      Component Value Range   Streptococcus, Group A Screen (Direct) NEGATIVE  NEGATIVE          Hayden Rasmussen, NP 03/09/12 2024

## 2012-03-09 NOTE — ED Notes (Addendum)
Pt c/o fever,headache,fatigue,chillls and sore throat noticing white patches on throat since yesterday. Pt is having sob and c/o muscle soreness with deep breathing.  Pt is having issue with boil in vaginal area x couple weeks pain with touch and unable to wear undergarments

## 2012-03-10 NOTE — ED Provider Notes (Signed)
Medical screening examination/treatment/procedure(s) were performed by non-physician practitioner and as supervising physician I was immediately available for consultation/collaboration.  Leslee Home, M.D.   Reuben Likes, MD 03/10/12 [phone]

## 2012-04-10 ENCOUNTER — Ambulatory Visit (INDEPENDENT_AMBULATORY_CARE_PROVIDER_SITE_OTHER): Payer: Self-pay | Admitting: Family Medicine

## 2012-04-10 ENCOUNTER — Encounter: Payer: Self-pay | Admitting: Family Medicine

## 2012-04-10 VITALS — BP 131/86 | HR 86 | Ht 62.0 in | Wt 161.0 lb

## 2012-04-10 DIAGNOSIS — R42 Dizziness and giddiness: Secondary | ICD-10-CM | POA: Insufficient documentation

## 2012-04-10 LAB — CBC WITH DIFFERENTIAL/PLATELET
Basophils Absolute: 0 10*3/uL (ref 0.0–0.1)
Basophils Relative: 0 % (ref 0–1)
Eosinophils Absolute: 0.2 10*3/uL (ref 0.0–0.7)
Eosinophils Relative: 2 % (ref 0–5)
Lymphocytes Relative: 33 % (ref 12–46)
MCHC: 34.5 g/dL (ref 30.0–36.0)
MCV: 90.5 fL (ref 78.0–100.0)
Platelets: 295 10*3/uL (ref 150–400)
RDW: 12.8 % (ref 11.5–15.5)
WBC: 9.1 10*3/uL (ref 4.0–10.5)

## 2012-04-10 LAB — COMPREHENSIVE METABOLIC PANEL
ALT: 39 U/L — ABNORMAL HIGH (ref 0–35)
AST: 22 U/L (ref 0–37)
CO2: 28 mEq/L (ref 19–32)
Chloride: 100 mEq/L (ref 96–112)
Sodium: 135 mEq/L (ref 135–145)
Total Bilirubin: 0.3 mg/dL (ref 0.3–1.2)
Total Protein: 7.3 g/dL (ref 6.0–8.3)

## 2012-04-10 LAB — TSH: TSH: 0.914 u[IU]/mL (ref 0.350–4.500)

## 2012-04-10 NOTE — Assessment & Plan Note (Addendum)
Dizziness onset 2 weeks ago and constant for approx 1 week.  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   - Galactorrhea, headache, blurred vision, and known Hx  MRI Head w and w/o TSH, Prolactin, CBC, CMP Plan f/u in two weeks, and will sched neurosurg as appropriate  Red flags for inc intracranial pressure given

## 2012-04-10 NOTE — Progress Notes (Signed)
  Subjective:    Patient ID: [name], female    DOB: 1981-04-29, 31 y.o.   MRN: [id number]  HPI Patient here for acute visit of dizziness for 1 week  Hx of pitiuitary adenoma, 8 mm at last check  Notes epsiode of dizziness lasting the morning about 1 week ago, some breast fullness and galactorhea at that time Also notes LMP about a week ago and very heavy bleeding for the first few days Drinking lots of fluids + blurry vision and scotoma worsening for last 3 days  For last 3 days has had constant dizziness, notes HA in well demarcated front R quarter of head, tender to teh touch, still feels sore but largely resolved now Also notes numbness on R side of face in V2 dist Also note sensitivity to light No nausea or vomiting,  Passed out once due to standing quickly out of bed.   Review of Systems Per HPI    Objective:   Physical Exam  Gen: NAD, alert, cooperative with exam HEENT: NCAT, EOMI, PERRL, TMs normal BL, No gross papilledema on on opthalmologic exam  - Visual fields full to confronattion Neuro: Alert and oriented  - CN 2-12 intact  - normal gait,   - Dix hallpike with head turned to right produced dizziness but no nystagmus, to the L no dizziness        Assessment & Plan:

## 2012-04-10 NOTE — Patient Instructions (Signed)
Thanks fo coming in today  I have ordered an MRI of your brain, the nurses will call you with the appointment I have also ordered some labs, I will call or send you a letter with the results  If you begin to throw up repeatedly, have any weakness on one side, or have seizure activity seek immediate medical help.   Pituitary Tumors Pituitary tumors are abnormal growths found in the pituitary gland. This is a Designer, jewellery. It is about the size of a dime and is located in the center of the brain. It makes hormones that affect growth and the functions of other glands in the body. Most pituitary tumors are benign. This means they are non-cancerous. They grow slowly and do not spread to other parts of the body. A pituitary tumor may make the pituitary gland produce too many hormones. This can cause other problems in the body. Tumors that make hormones are called functioning tumors. Those that do not make hormones are called non-functioning tumors. Certain pituitary tumors cause Cushing's disease. This disease causes fat to build up in the face, back and chest. The arms and legs become thin. Other pituitary tumors can cause acromegaly which is a condition in which the hands, feet and face are larger than normal. Another type of tumor can cause breasts to make milk even though there is no pregnancy.  SYMPTOMS  Symptoms may include:  Headaches.  Vision problems.  Nausea and vomiting.  Any of the problems caused by the production of too many hormones, such as:  Infertility or loss of menstrual periods in women.  Abnormal growth.  High blood pressure.  Heat or cold intolerance.  Other skin and body changes. TREATMENT  These tumors are best treated when they are found and diagnosed early. Treatments include:  Surgical removal of the tumor. This is the most common treatment.  Radiation therapy, using high-doses of X-rays to kill tumor cells.  Drug therapy, using certain medications to block the  pituitary gland from producing too many hormones. Document Released: 04/30/2002 Document Revised: 08/02/2011 Document Reviewed: 05/07/2008 [facility] Patient Information 2013 [place], [place].

## 2012-04-11 ENCOUNTER — Ambulatory Visit (HOSPITAL_COMMUNITY)
Admission: RE | Admit: 2012-04-11 | Discharge: 2012-04-11 | Disposition: A | Discharge status: Home / Self Care | Payer: Self-pay | Source: Ambulatory Visit | Attending: Family Medicine | Admitting: Family Medicine

## 2012-04-11 DIAGNOSIS — H538 Other visual disturbances: Secondary | ICD-10-CM | POA: Insufficient documentation

## 2012-04-11 DIAGNOSIS — R42 Dizziness and giddiness: Secondary | ICD-10-CM | POA: Insufficient documentation

## 2012-04-11 DIAGNOSIS — R51 Headache: Secondary | ICD-10-CM | POA: Insufficient documentation

## 2012-04-11 MED ORDER — GADOBENATE DIMEGLUMINE 529 MG/ML IV SOLN
11.0000 mL | Freq: Once | INTRAVENOUS | Status: AC | PRN
  Administered 2012-04-11: 11 mL via INTRAVENOUS

## 2012-04-12 ENCOUNTER — Telehealth: Payer: Self-pay | Admitting: Family Medicine

## 2012-04-12 DIAGNOSIS — R42 Dizziness and giddiness: Secondary | ICD-10-CM

## 2012-04-12 MED ORDER — MECLIZINE HCL 25 MG PO TABS: 25.0000 mg | ORAL_TABLET | Freq: Three times a day (TID) | ORAL | Status: DC | PRN

## 2012-04-12 NOTE — Telephone Encounter (Signed)
Called patient to let her know that her labs were normal and that her pituitary tumor was 3.4 mm which is well below the level of needing surgical intervention. Discussed tha the next most likely thing is BPPV. She is still having symptoms so I sent an Rx for meclizine and ordered PT for vestibular rehab, she agrees with plan.

## 2012-05-12 ENCOUNTER — Other Ambulatory Visit: Payer: Self-pay | Admitting: Family Medicine

## 2012-05-12 DIAGNOSIS — R42 Dizziness and giddiness: Secondary | ICD-10-CM

## 2012-06-05 ENCOUNTER — Telehealth: Payer: Self-pay | Admitting: Family Medicine

## 2012-06-05 NOTE — Telephone Encounter (Signed)
Patient is calling to speak to the nurse about how to treat her symptoms.

## 2012-06-05 NOTE — Telephone Encounter (Addendum)
Spoke with patient  And she reports day before yesterday she had chest tightness, shortness of breath , feels she had fever yesterday . Did not check. Has cough and back pain now , no chest discomfort.  Nasal secretions and mucous is brown.  Still feels some shortness of breath. Advised that she does need to be seen for  evaluation. Recommended she go to Urgent Care this evening.  She states she will consider going this evening or if not feeling better by morning will call us for appointment here tomorrow.

## 2012-06-06 ENCOUNTER — Ambulatory Visit
Payer: PRIVATE HEALTH INSURANCE | Attending: Family Medicine | Admitting: Rehabilitative and Restorative Service Providers"

## 2012-06-06 DIAGNOSIS — R42 Dizziness and giddiness: Secondary | ICD-10-CM | POA: Insufficient documentation

## 2012-06-06 DIAGNOSIS — IMO0001 Reserved for inherently not codable concepts without codable children: Secondary | ICD-10-CM | POA: Insufficient documentation

## 2012-06-12 ENCOUNTER — Other Ambulatory Visit: Payer: Self-pay | Admitting: Family Medicine

## 2012-06-12 DIAGNOSIS — R42 Dizziness and giddiness: Secondary | ICD-10-CM

## 2012-06-13 ENCOUNTER — Ambulatory Visit: Payer: PRIVATE HEALTH INSURANCE | Admitting: Physical Therapy

## 2012-06-14 ENCOUNTER — Other Ambulatory Visit: Payer: Self-pay | Admitting: Family Medicine

## 2012-06-14 NOTE — Telephone Encounter (Signed)
Dr. Ermalinda Memos prescribed this mediation for her, please forward request to him. If he does not refill, I would like to see her prior to prescribing her medication. Thank You.

## 2012-06-19 ENCOUNTER — Ambulatory Visit: Payer: PRIVATE HEALTH INSURANCE | Admitting: Physical Therapy

## 2012-06-21 ENCOUNTER — Encounter: Payer: PRIVATE HEALTH INSURANCE | Admitting: Physical Therapy

## 2012-06-26 ENCOUNTER — Encounter: Payer: PRIVATE HEALTH INSURANCE | Admitting: Physical Therapy

## 2012-06-27 ENCOUNTER — Encounter: Payer: Self-pay | Admitting: Family Medicine

## 2012-06-27 ENCOUNTER — Ambulatory Visit (INDEPENDENT_AMBULATORY_CARE_PROVIDER_SITE_OTHER): Payer: PRIVATE HEALTH INSURANCE | Admitting: Family Medicine

## 2012-06-27 VITALS — BP 111/84 | HR 75 | Temp 98.2°F | Ht 62.0 in | Wt 163.0 lb

## 2012-06-27 DIAGNOSIS — Z23 Encounter for immunization: Secondary | ICD-10-CM

## 2012-06-27 DIAGNOSIS — R42 Dizziness and giddiness: Secondary | ICD-10-CM

## 2012-06-27 DIAGNOSIS — H9201 Otalgia, right ear: Secondary | ICD-10-CM | POA: Insufficient documentation

## 2012-06-27 DIAGNOSIS — D352 Benign neoplasm of pituitary gland: Secondary | ICD-10-CM

## 2012-06-27 DIAGNOSIS — H9209 Otalgia, unspecified ear: Secondary | ICD-10-CM

## 2012-06-27 MED ORDER — MECLIZINE HCL 25 MG PO TABS: 25.0000 mg | ORAL_TABLET | Freq: Three times a day (TID) | ORAL | Status: DC | PRN

## 2012-06-27 MED ORDER — PREDNISONE 20 MG PO TABS: ORAL_TABLET | ORAL | Status: DC

## 2012-06-27 NOTE — Patient Instructions (Addendum)
It was a pleasure to meet you today. I would like to see yo back in two weeks for checkup and new patient appt.  Dizziness Dizziness is a common problem. It is a feeling of unsteadiness or lightheadedness. You may feel like you are about to faint. Dizziness can lead to injury if you stumble or fall. A person of any age group can suffer from dizziness, but dizziness is more common in older adults. CAUSES  Dizziness can be caused by many different things, including:  Middle ear problems.  Standing for too long.  Infections.  An allergic reaction.  Aging.  An emotional response to something, such as the sight of blood.  Side effects of medicines.  Fatigue.  Problems with circulation or blood pressure.  Excess use of alcohol, medicines, or illegal drug use.  Breathing too fast (hyperventilation).  An arrhythmia or problems with your heart rhythm.  Low red blood cell count (anemia).  Pregnancy.  Vomiting, diarrhea, fever, or other illnesses that cause dehydration.  Diseases or conditions such as Parkinson's disease, high blood pressure (hypertension), diabetes, and thyroid problems.  Exposure to extreme heat. DIAGNOSIS  To find the cause of your dizziness, your caregiver may do a physical exam, lab tests, radiologic imaging scans, or an electrocardiography test (ECG).  TREATMENT  Treatment of dizziness depends on the cause of your symptoms and can vary greatly. HOME CARE INSTRUCTIONS   Drink enough fluids to keep your urine clear or pale yellow. This is especially important in very hot weather. In the elderly, it is also important in cold weather.  If your dizziness is caused by medicines, take them exactly as directed. When taking blood pressure medicines, it is especially important to get up slowly.  Rise slowly from chairs and steady yourself until you feel okay.  In the morning, first sit up on the side of the bed. When this seems okay, stand slowly while holding  onto something until you know your balance is fine.  If you need to stand in one place for a long time, be sure to move your legs often. Tighten and relax the muscles in your legs while standing.  If dizziness continues to be a problem, have someone stay with you for a day or two. Do this until you feel you are well enough to stay alone. Have the person call your caregiver if he or she notices changes in you that are concerning.  Do not drive or use heavy machinery if you feel dizzy.  Do not drink alcohol. SEEK IMMEDIATE MEDICAL CARE IF:   Your dizziness or lightheadedness gets worse.  You feel nauseous or vomit.  You develop problems with talking, walking, weakness, or using your arms, hands, or legs.  You are not thinking clearly or you have difficulty forming sentences. It may take a friend or family member to determine if your thinking is normal.  You develop chest pain, abdominal pain, shortness of breath, or sweating.  Your vision changes.  You notice any bleeding.  You have side effects from medicine that seems to be getting worse rather than better. MAKE SURE YOU:   Understand these instructions.  Will watch your condition.  Will get help right away if you are not doing well or get worse. Document Released: 11/03/2000 Document Revised: 08/02/2011 Document Reviewed: 11/27/2010 [facility] Patient Information 2013 [place], [place].

## 2012-06-27 NOTE — Assessment & Plan Note (Signed)
-   Right  Ear Pain: More occipital and not mastoid.  No ear pain on exam, unlikely infection. More than likely, related to neuralgia.  - Dizziness: Probable labyrinthitis/vetisbular neuritis considering symptoms occuring post URI, no signs of infection.   - Recommended to continue her PT and home exercises.  - Meclizine for nausea/vomit prescribed  - Prednisone x10d  With taper for vestibular neuritis.  - F/U prn or if symptoms worsen

## 2012-06-27 NOTE — Progress Notes (Signed)
Subjective:     Patient ID: [name], female   DOB: [birth year], 32 y.o.   MRN: [id number]  HPI   Review of Systems     Objective:   Physical Exam

## 2012-06-27 NOTE — Assessment & Plan Note (Addendum)
-   Pain: More occipital and not mastoid, neuralgia in nature.  No ear pain on exam, unlikely infection.  - Dizziness: Probable labyrinthitis/vetisbular neuritis considering symptoms occuring post URI, no signs of infection.   - Recommended to continue her PT and home exercises.  - Meclizine for nausea/vomit prescribed  - Prednisone x10d  With taper for vestibular neuritis.  - F/U prn or if symptoms worsen

## 2012-06-28 ENCOUNTER — Encounter: Payer: PRIVATE HEALTH INSURANCE | Admitting: Physical Therapy

## 2012-07-03 ENCOUNTER — Ambulatory Visit (INDEPENDENT_AMBULATORY_CARE_PROVIDER_SITE_OTHER): Payer: PRIVATE HEALTH INSURANCE | Admitting: Family Medicine

## 2012-07-03 ENCOUNTER — Encounter: Payer: Self-pay | Admitting: Family Medicine

## 2012-07-03 ENCOUNTER — Encounter: Payer: PRIVATE HEALTH INSURANCE | Admitting: Physical Therapy

## 2012-07-03 VITALS — BP 137/84 | HR 65 | Temp 97.9°F | Wt 164.5 lb

## 2012-07-03 DIAGNOSIS — R42 Dizziness and giddiness: Secondary | ICD-10-CM

## 2012-07-03 NOTE — Progress Notes (Signed)
  Subjective:    Patient ID: [name], female    DOB: 13-Dec-1980, 32 y.o.   MRN: [id number]  HPI  Patient presents to clinic for dry mouth, abdominal swelling, and diffuse abdominal pain.  Abdominal pain radiates to chest.  Symptoms started Friday afternoon and symptoms have been getting worth.  Today, she complains of difficulty breathing due to bloating in her abdomen and face.  She also complains of increased appetite.  Patient recently started taking Prednisone 60 mg 5 days ago for vestibular neuritis.  Now she says ringing in ears is gone, but she is still dizzy.  Meclizine helps with dizziness.  She went to PT for one day, but cost $300 so she stopped going and tries to perform exercises at home.  Associated symptoms: nausea.  Denies any fevers.  Today, she complains of chest pressure and associated breathing and nausea.  Never had an MI.  No family hx of MI.  Review of Systems  Per HPI    Objective:   Physical Exam  Constitutional: She appears well-nourished. No distress.  HENT:  Dry MM  Cardiovascular: Normal rate, regular rhythm and normal heart sounds.   Pulmonary/Chest: Effort normal and breath sounds normal. She has no wheezes. She has no rales.  Abdominal: Soft. Bowel sounds are normal. She exhibits no distension. There is no tenderness. There is no rebound and no guarding.  Musculoskeletal: She exhibits no edema.  Psychiatric: Her mood appears anxious.       Assessment & Plan:

## 2012-07-03 NOTE — Assessment & Plan Note (Signed)
Dizziness seems to be improving on Meclizine, however she has developed indigestion, swelling, dry mouth after high dose steroids (beginning of 10 day taper).  Plan to discontinue steroid - benefit does not outweigh harm.  Continue Meclizine and home PT exercises.  Follow up in 2 weeks with PCP for lab work and consider referral to ENT.

## 2012-07-03 NOTE — Patient Instructions (Addendum)
Stop taking Prednisone and your symptoms should resolve. Continue Meclizine as needed for dizziness. You may take Tums or rolaids to reduce chest discomfort now. If you develop shortness of breath, chest pain, or nausea/vomiting AFTER stopping steroids, please return to clinic or report to ER. Schedule follow up with your PCP in 4 weeks or sooner as needed.

## 2012-07-05 ENCOUNTER — Encounter: Payer: PRIVATE HEALTH INSURANCE | Admitting: Physical Therapy

## 2012-07-10 ENCOUNTER — Encounter: Payer: PRIVATE HEALTH INSURANCE | Admitting: Physical Therapy

## 2012-07-12 ENCOUNTER — Encounter: Payer: PRIVATE HEALTH INSURANCE | Admitting: Physical Therapy

## 2012-07-14 ENCOUNTER — Encounter: Payer: Self-pay | Admitting: Family Medicine

## 2012-07-18 ENCOUNTER — Encounter: Payer: PRIVATE HEALTH INSURANCE | Admitting: Family Medicine

## 2012-07-21 ENCOUNTER — Encounter: Payer: Self-pay | Admitting: Family Medicine

## 2012-07-21 ENCOUNTER — Ambulatory Visit (INDEPENDENT_AMBULATORY_CARE_PROVIDER_SITE_OTHER): Payer: PRIVATE HEALTH INSURANCE | Admitting: Family Medicine

## 2012-07-21 VITALS — BP 132/81 | HR 76 | Ht 62.6 in | Wt 166.0 lb

## 2012-07-21 DIAGNOSIS — K7689 Other specified diseases of liver: Secondary | ICD-10-CM

## 2012-07-21 DIAGNOSIS — N912 Amenorrhea, unspecified: Secondary | ICD-10-CM

## 2012-07-21 DIAGNOSIS — F411 Generalized anxiety disorder: Secondary | ICD-10-CM

## 2012-07-21 DIAGNOSIS — R42 Dizziness and giddiness: Secondary | ICD-10-CM

## 2012-07-21 DIAGNOSIS — E669 Obesity, unspecified: Secondary | ICD-10-CM

## 2012-07-21 LAB — GLUCOSE, CAPILLARY: Glucose-Capillary: 133 mg/dL — ABNORMAL HIGH (ref 70–99)

## 2012-07-21 NOTE — Patient Instructions (Addendum)
It was nice to see you again today.  I would like to see you again 3-4 weeks. At that appointment I would like you to have a 3 day food/drink recall of everything you ate/drank and what time you consumed it, along with amounts. I will also do your PAP smear that day as well. Please schedule an appt to have your fasting lab work completed this week. I would like you to see Alan Mulder (at this office) for a health coach referral.  On your next appt I will discuss in more detail the probability of starting a medication for anxiety. Ansiedad y crisis de [place] (Anxiety and Panic Attacks) El profesional que lo asiste le ha informado que usted padece ansiedad o crisis de [place]. Este trastorno puede presentarse de [employer]. La mayor parte de las veces las crisis aparecen de modo repentino y sin aviso. Se producen en cualquier momento del da, incluso durante el sueo, y en cualquier etapa de la vida. Pueden ser muy intensas e inexplicadas. Aunque un ataque de pnico puede ser muy atemorizante, no produce daos fsicos. Algunas veces se desconoce la causa de la ansiedad. La ansiedad es un mecanismo protector del organismo en su respuesta de lucha o escape. Muchas de estas situaciones de percepcin de peligro son en realidad situaciones no fsicas (como la ansiedad de perder el [place]). CAUSAS Las causas de la ansiedad o de un ataque de pnico pueden ser [place]. Los ataques de pnico pueden ocurrir en personas sanas en una serie de circunstancias. Es posible que haya una causa gentica de los ataques de pnico. Algunos medicamentos pueden provocar ansiedad como efecto secundario. SNTOMAS Algunas de las sensaciones ms comunes son:  Terror intenso.  Vahdos, desfallecimiento.  Golpes de fro y Airline pilot.  Temor a Estate manager/land agent.  Sentimiento de irrealidad.  Sudoracin.  Temblores.  Dolor en el pecho y latidos irregulares (palpitaciones).  Sensaciones de [employer] o sofocos.  Sentimiento  de peligro inminente y de que la muerte est prxima.  Hormigueo en las extremidades que puede venir de la respiracin agitada.  Alteracin de la realidad (desrealizacin).  Sentirse separado de uno mismo (despersonalizacin). Estos son los sntomas (problemas) ms comunes y pueden combinarse para presentar la crisis de [place].  DIAGNSTICO La evaluacin que realice el profesional depender del tipo de sntomas que est experimentando. El diagnstico de la ansiedad o de ataque de pnico se realiza cuando no se encuentra ninguna enfermedad fsica que pueda determinarse como la causa de los sntomas. TRATAMIENTO El tratamiento para prevenir la ansiedad y los ataques de pnico incluye:  Automotive engineer las circunstancias que causen ansiedad.  Reaseguro y relajacin.  Ejercicio regular.  Terapias de relajacin, como yoga.  Psicoterapia con un psiquiatra o terapeuta.  Evitar la cafena, el alcohol y las drogas [address]  Medicamentos de prescripcin. SOLICITE ATENCIN MDICA DE INMEDIATO SI:  Usted experimenta sntomas de ataque de pnico que son distintos a sus sntomas usuales.  Tiene cualquier sntoma que empeora o lo preocupa. Document Released: 05/10/2005 Document Revised: 08/02/2011 [facility] Patient Information 2013 [place], [place].

## 2012-07-21 NOTE — Progress Notes (Signed)
Subjective:     Patient ID: [name], female   DOB: 1981/04/18, 32 y.o.   MRN: [id number]  HPI  New PCP with exam: [name] is a new patient to this provider. She has an extensive positive review of symptoms. She is here today for establishment of care and is with her three children. She will make an additional appointment for her breast and PAP/Pelvic exam as soon as possible. Will perform a full skin exam at that time as well.   She complains of: Weight gain: - Since July of last year she has a 15 pound unintentional weight gain. She reports she does not think she is eating anything different or more than usual, however later in the conversation she states she has been eating candy more often. She dos not perform any type of daily exercise. She had been to see the life coach (susan) and feels it did not help her much. 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 She has had a tubal ligation, but constantly feels stomach pains (heavy cramps), bloated, gassy, nausea and feels "funny" with her stomach. She is concerned and desires a pregnancy test. Lavetta Nielsen denies constipation, diarrhea or fever.   Dizziness with diaphoresis: She has had issues with vertigo in the past and been placed on meclizine. She also went to PT, but d/t cost out of pocket she had to stop going, but she does report she performs the exercises for labyrinthitis at home. This event of dizziness she explains was new and has happened twice since she was seen a few weeks ago. She reports she also experienced sweatiness and abdominal cramping, and it "scared" her. She was lying down and was attempting to get out of bed when this happened. She stayed in bed and in 1.5 hours It self resolved. Of note she did experience gestational diabetes in her last pregnancy, three years ago.   Ear pain/tinnitius: She has been seen for labyrinthitis and  tinnitus. She takes meclizine for this when she feels she needs it. She does endorse this is improving. She was placed on steroids taper for ten days to treat labyrinthitis and was seen in the office for possible allergic reaction to the prednisone. She reports her stomach bloated, she was gassy and she felt like her heart was racing. The medication was discontinued, however she still complains of bloated and gassiness. The tinnitus has resolved.   Irregular menstrual cycles: She reports since her adenoma her menstrual cycles have not been regular, as they were prior. She now has cycles that every two weeks, sometimes 6 weeks.   Anxiety/depression: Patient has had anxiety and depression on her medical chart history, yet she has not been medicated. She admits to occasional chest tightness/pain with numbness down her left arm. That last "hours" and self resolves. She has been worried about this and went to the ED prior and was thought to be anxiety driven. She reports she has a "fast heart rate"  She reports she has never been treated for any mental illnesses. Her mother was treated for anxiety, buthad to quite medications because they gave her a "clot". She does not seem to believe this is an important issue to her and declines intervention at this time.  PHQ-9 score today: 16 (moderate to severe depression).     Review of Systems  HENT: Positive for ear pain.   Eyes: Positive for visual  disturbance.  Respiratory: Positive for chest tightness and shortness of breath.   Cardiovascular: Positive for chest pain.       Fast heartbeat  Gastrointestinal: Positive for nausea and abdominal pain.  Endocrine: Positive for polydipsia and polyuria.  Genitourinary: Positive for menstrual problem.       "lump in breast"  Musculoskeletal: Positive for myalgias and arthralgias.       "arm pain"  Skin:       New or changing mole, acne on back   Allergic/Immunologic: Positive for environmental allergies.   Neurological: Positive for dizziness, weakness and numbness.  Psychiatric/Behavioral:       Stress, memory loss       Objective:   Physical Exam  Nursing note and vitals reviewed. Constitutional: She is oriented to person, place, and time. She appears well-developed and well-nourished. No distress.  Speaks fast, unsure if cultural or pressured. Anxious.   HENT:  Head: Normocephalic and atraumatic.  Right Ear: External ear normal.  Left Ear: External ear normal.  Nose: Nose normal.  Mouth/Throat: Oropharynx is clear and moist. No oropharyngeal exudate.  Eyes: Conjunctivae and EOM are normal. Pupils are equal, round, and reactive to light. Right eye exhibits no discharge. Left eye exhibits no discharge. No scleral icterus.  Neck: Normal range of motion. Neck supple. No thyromegaly present.  Cardiovascular: Normal rate and regular rhythm.   Murmur heard. Pulmonary/Chest: Effort normal and breath sounds normal. No respiratory distress. She has no wheezes. She has no rales.  Abdominal: Soft. Bowel sounds are normal. She exhibits no distension and no mass. There is no tenderness. There is no rebound and no guarding.  Genitourinary:  Well women \\exam  with PAP to be completed next visit.  Musculoskeletal: Normal range of motion. She exhibits no edema and no tenderness.  Lymphadenopathy:    She has no cervical adenopathy.  Neurological: She is alert and oriented to person, place, and time. She has normal reflexes.  Skin: Skin is warm and dry.  Acne on back   Psychiatric: Her mood appears anxious. Her speech is rapid and/or pressured. Cognition and memory are normal.  Circumstantial speech/thinking, connecting one symptoms to another. Thought possibly paranoid or fearful of medications and symptoms. Unsure if judgement is normal at this time.     BP 132/81  Pulse 76  Ht 5' 2.6" (1.59 m)  Wt 166 lb (75.297 kg)  BMI 29.78 kg/m2  LMP 06/20/2012

## 2012-07-23 ENCOUNTER — Encounter: Payer: Self-pay | Admitting: Family Medicine

## 2012-07-23 DIAGNOSIS — E669 Obesity, unspecified: Secondary | ICD-10-CM | POA: Insufficient documentation

## 2012-07-23 HISTORY — DX: Obesity, unspecified: E66.9

## 2012-07-23 NOTE — Assessment & Plan Note (Signed)
-   FHx of anxiety  - Pt declines intervention at this time, but agrees to discuss on next appointment. Will need to consider weight gain and other possible co-morbid psych conditions when placing on medication.  - Will perform GAD-7 assessment on next visit.  PHQ-9 score of 16 today (moderatley severe).  - Explained to patient many of her symptoms can be exacerbated with elevated anxiety levels. She will think about it and we will re-visit this on her next appointment.  - Considering her extensive review of symptoms there is a possibility of factious disorder vs. GAD vs. Somatization disorder vs hypochondriasis.  Considering her area of concern seems to not be focused on 1-2 diseases, but rather many different symptoms of GI, pain, GU, neuro etc... Somatization is a more likely than hypochondriasis. Will need a GAD-7 assessment to r/o the possibility of generalized anxiety and its affect on other facets of her life.  - Psych consult in consideration.

## 2012-07-23 NOTE — Assessment & Plan Note (Addendum)
-   H/o of gestational diabetes - weight gain of 15 lbs in 6 months, post pituitary adenoma.  Current BMI >30 - TSH has been normal.  - Urine preg negative today (BTL) - Appointment with [name]--> patient declined, but was open to bring in 3 day eating history  - CBG: 133 today. Fasting glucose, lipids, A1C and CMP ordered for future labs to be scheduled and obtained for this week.  - F/U: within 3 weeks for results and Well women exam and PAP smear.

## 2012-07-24 ENCOUNTER — Other Ambulatory Visit (INDEPENDENT_AMBULATORY_CARE_PROVIDER_SITE_OTHER): Payer: PRIVATE HEALTH INSURANCE

## 2012-07-24 LAB — COMPREHENSIVE METABOLIC PANEL
BUN: 14 mg/dL (ref 6–23)
CO2: 26 mEq/L (ref 19–32)
Calcium: 9.2 mg/dL (ref 8.4–10.5)
Chloride: 104 mEq/L (ref 96–112)
Creat: 0.64 mg/dL (ref 0.50–1.10)
Glucose, Bld: 102 mg/dL — ABNORMAL HIGH (ref 70–99)
Total Bilirubin: 0.4 mg/dL (ref 0.3–1.2)

## 2012-07-24 LAB — LIPID PANEL
Cholesterol: 199 mg/dL (ref 0–200)
Total CHOL/HDL Ratio: 5 Ratio
Triglycerides: 171 mg/dL — ABNORMAL HIGH (ref <150)
VLDL: 34 mg/dL (ref 0–40)

## 2012-07-24 NOTE — Progress Notes (Signed)
CMET, FLP, A1c drawn today. Dewitt Hoes, MLS

## 2012-08-04 ENCOUNTER — Telehealth: Payer: Self-pay | Admitting: Family Medicine

## 2012-08-04 NOTE — Telephone Encounter (Signed)
She was suppose to make an appointment for 3 weeks after her appointment to discuss her lab work and anxiety, she was also suppose to get her PAP on that day. I am not sure if she has made this appointment or not.

## 2012-08-04 NOTE — Telephone Encounter (Signed)
Patient is calling because she hasn't gotten the results of the blood work from a couple of weeks ago.

## 2012-08-04 NOTE — Telephone Encounter (Signed)
In addition, I explained to her I would only have her results called to her if they were something we needed to immediately address, otherwise I was going to go over her labs in person at her appointment. If she must know they are ok and nothing immediate to be concerned about. If she does not plan to come to the office to follow up then I will send a letter for her records.

## 2012-08-07 NOTE — Telephone Encounter (Signed)
Related message,will schedule a follow up once she looks at her schedule. Danial Sisley, Virgel Bouquet

## 2012-09-04 ENCOUNTER — Telehealth: Payer: Self-pay | Admitting: Family Medicine

## 2012-09-04 ENCOUNTER — Ambulatory Visit (INDEPENDENT_AMBULATORY_CARE_PROVIDER_SITE_OTHER): Payer: PRIVATE HEALTH INSURANCE | Admitting: Family Medicine

## 2012-09-04 ENCOUNTER — Encounter: Payer: Self-pay | Admitting: Family Medicine

## 2012-09-04 ENCOUNTER — Other Ambulatory Visit (HOSPITAL_COMMUNITY)
Admission: RE | Admit: 2012-09-04 | Discharge: 2012-09-04 | Disposition: A | Discharge status: Home / Self Care | Payer: PRIVATE HEALTH INSURANCE | Source: Ambulatory Visit | Attending: Family Medicine | Admitting: Family Medicine

## 2012-09-04 VITALS — BP 135/76 | HR 89 | Temp 99.5°F | Wt 167.4 lb

## 2012-09-04 DIAGNOSIS — R102 Pelvic and perineal pain: Secondary | ICD-10-CM | POA: Insufficient documentation

## 2012-09-04 DIAGNOSIS — N949 Unspecified condition associated with female genital organs and menstrual cycle: Secondary | ICD-10-CM

## 2012-09-04 DIAGNOSIS — N912 Amenorrhea, unspecified: Secondary | ICD-10-CM | POA: Insufficient documentation

## 2012-09-04 DIAGNOSIS — Z113 Encounter for screening for infections with a predominantly sexual mode of transmission: Secondary | ICD-10-CM | POA: Insufficient documentation

## 2012-09-04 DIAGNOSIS — R109 Unspecified abdominal pain: Secondary | ICD-10-CM

## 2012-09-04 HISTORY — DX: Pelvic and perineal pain: R10.2

## 2012-09-04 LAB — POCT URINALYSIS DIPSTICK
Bilirubin, UA: NEGATIVE
Blood, UA: NEGATIVE
Glucose, UA: NEGATIVE
Ketones, UA: NEGATIVE
Leukocytes, UA: NEGATIVE
Nitrite, UA: NEGATIVE
pH, UA: 6

## 2012-09-04 LAB — POCT WET PREP (WET MOUNT): WBC, Wet Prep HPF POC: >20

## 2012-09-04 MED ORDER — DOXYCYCLINE MONOHYDRATE 100 MG PO TABS: 100.0000 mg | ORAL_TABLET | Freq: Two times a day (BID) | ORAL | Status: DC

## 2012-09-04 MED ORDER — IBUPROFEN 600 MG PO TABS: 600.0000 mg | ORAL_TABLET | Freq: Three times a day (TID) | ORAL | Status: DC | PRN

## 2012-09-04 NOTE — Progress Notes (Signed)
  Subjective:    Patient ID: [name], female    DOB: December 12, 1980, 32 y.o.   MRN: [id number]  Abdominal Pain  Abdominal Cramping    1. Bilateral lower abdominal/pelvic pain. The patient began noticing symptoms yesterday. She notes a pressure type sensation and bloating. Pain location is in the left lower quadrant/pelvic area, present to a lesser degree in the right side and suprapubic area also, radiates to low back. She relates the sensation to the feeling of being pregnant and about to deliver. A new vaginal mucoid discharge is present also. Denies constipation, diarrhea, blood in stool, nausea, emesis, fever, chills,  abnormal vaginal bleeding, dysuria, hematuria, back pain, weight loss.  Notably she also has 2-3 months of amenorrhea and increased lower abdominal swelling. She has taken a home pregnancy test that was negative one month ago. History of tubal ligation 3 years ago. LMP 06/22/12. Is married and monogamous. No history of STD.  Review of Systems  Gastrointestinal: Positive for abdominal pain.   She endorses headache and feeling of satiety. Denies any nausea, emesis, diarrhea, blood in stool, abnormal vaginal bleeding, dysuria, hematuria, back pain, weight loss    Objective:   Physical Exam  Vitals reviewed. Constitutional: She is oriented to person, place, and time. She appears well-developed and well-nourished. No distress.  HENT:  Head: Normocephalic and atraumatic.  Mouth/Throat: Oropharynx is clear and moist. No oropharyngeal exudate.  Eyes: EOM are normal. Pupils are equal, round, and reactive to light.  Cardiovascular: Normal rate, regular rhythm and normal heart sounds.   No murmur heard. Pulmonary/Chest: Effort normal and breath sounds normal. No respiratory distress. She has no wheezes. She has no rales.  Abdominal: Soft. Bowel sounds are normal.  Obese. Nondistended. Moderate TTP in LLQ>RLQ and suprapubic area. No guarding or rebound.  Genitourinary:  Vaginal discharge found.  Significant mucoid/yellowish vaginal discharge. Slight friability to cervix.  No ulcers or bleeding. Cervical motion tenderness is present. Uterus feels boggy. No masses palpated.  Musculoskeletal: She exhibits no edema and no tenderness.  Neurological: She is alert and oriented to person, place, and time.  Skin: No rash noted. She is not diaphoretic.  Psychiatric: She has a normal mood and affect.        Assessment & Plan:

## 2012-09-04 NOTE — Assessment & Plan Note (Addendum)
Most likely a urogenital cause, less likely related to urinary tract or GI illness. Low likely to be appendicitis, biliary, or diverticulitis given the pain location and her associated vaginal symptoms. Will treat empirically for PID with rocephin IM x 1 and doxycycline. (remote hx of hives with PCN/amp, discussed rare risk of anaphylaxis w pt). Wet prep, GC/Ch culture sent. Check CBC. Discussed red flags to seek emergency care: fever, emesis, worsened pain, otherwise f/u in 2 weeks with PCP. Would consider pelvic US if symptoms persist.

## 2012-09-04 NOTE — Patient Instructions (Addendum)
Start taking motrin every 6-8 hours for pain. Make an appointment for blood work. Start taking doxycycline antibiotic. We might get an ultrasound if still having symptoms. Make an appointment with Dr. Claiborne Billings for follow up in 2 weeks. If you have fever, vomiting, worsened pain then go to ER.

## 2012-09-04 NOTE — Telephone Encounter (Signed)
Pt is having pain on her low abd/side area - she wants to come in today

## 2012-09-04 NOTE — Assessment & Plan Note (Signed)
Likely unrelated to current pain. Secondary cause of pituitary adenoma seems most likely since this is a current diagnosis, less likely primary ovarian failure given her age. Will check prolactin, TSH, CBC. Patient to return tomorrow since lab is closed.

## 2012-09-04 NOTE — Telephone Encounter (Signed)
Returned call to patient.  C/o left "ovary and uterus" pain and lower back pain.  Describes pain as 8/10 and "feels like pressure or contractions."  LMP 06/17/12.  Had BTL and hx of C-section.   Scheduled work-in appt on overflow clinic for today at 3:30 pm.  Gaylene Brooks, RN

## 2012-09-05 MED ORDER — CEFTRIAXONE SODIUM 250 MG IJ SOLR
250.0000 mg | Freq: Once | INTRAMUSCULAR | Status: AC
  Administered 2012-09-04: 250 mg via INTRAMUSCULAR

## 2012-09-05 NOTE — Addendum Note (Signed)
Addended by: Farrell Ours on: 09/05/2012 08:38 AM   Modules accepted: Orders

## 2012-09-11 ENCOUNTER — Telehealth: Payer: Self-pay | Admitting: Family Medicine

## 2012-09-11 MED ORDER — AZITHROMYCIN 250 MG PO TABS: ORAL_TABLET | ORAL | Status: DC

## 2012-09-11 NOTE — Telephone Encounter (Signed)
Please advise. Docia Klar S  

## 2012-09-11 NOTE — Telephone Encounter (Signed)
Patient is calling because she hasn't gotten the results from the pap smear.  Also, the medication that was prescribed was over $100 so she didn't get it so she wants something more affordable and she is still in pain.

## 2012-09-11 NOTE — Telephone Encounter (Signed)
Please inform patient her labs for infection were all negative. Since doxycycline is too expensive, I have sent an rx for azithromycin 1gm weekly x 2 weeks.needs to follow up in clinic if still having pain,  Imaging/further workup maybe warranted, as we discussed in clinic.

## 2012-09-12 NOTE — Telephone Encounter (Signed)
Related message,pt voiced understanding. Melody Cirrincione S  

## 2012-10-24 ENCOUNTER — Encounter: Payer: Self-pay | Admitting: Family Medicine

## 2012-10-24 ENCOUNTER — Other Ambulatory Visit (HOSPITAL_COMMUNITY)
Admission: RE | Admit: 2012-10-24 | Discharge: 2012-10-24 | Disposition: A | Discharge status: Home / Self Care | Payer: PRIVATE HEALTH INSURANCE | Source: Ambulatory Visit | Attending: Family Medicine | Admitting: Family Medicine

## 2012-10-24 ENCOUNTER — Ambulatory Visit (INDEPENDENT_AMBULATORY_CARE_PROVIDER_SITE_OTHER): Payer: PRIVATE HEALTH INSURANCE | Admitting: Family Medicine

## 2012-10-24 VITALS — BP 128/75 | HR 67 | Temp 98.1°F | Ht 62.0 in | Wt 160.4 lb

## 2012-10-24 DIAGNOSIS — J302 Other seasonal allergic rhinitis: Secondary | ICD-10-CM | POA: Insufficient documentation

## 2012-10-24 DIAGNOSIS — D352 Benign neoplasm of pituitary gland: Secondary | ICD-10-CM

## 2012-10-24 DIAGNOSIS — N949 Unspecified condition associated with female genital organs and menstrual cycle: Secondary | ICD-10-CM

## 2012-10-24 DIAGNOSIS — N760 Acute vaginitis: Secondary | ICD-10-CM

## 2012-10-24 DIAGNOSIS — E039 Hypothyroidism, unspecified: Secondary | ICD-10-CM

## 2012-10-24 DIAGNOSIS — Z91018 Allergy to other foods: Secondary | ICD-10-CM

## 2012-10-24 DIAGNOSIS — Z124 Encounter for screening for malignant neoplasm of cervix: Secondary | ICD-10-CM

## 2012-10-24 DIAGNOSIS — Z01419 Encounter for gynecological examination (general) (routine) without abnormal findings: Secondary | ICD-10-CM | POA: Insufficient documentation

## 2012-10-24 DIAGNOSIS — R1032 Left lower quadrant pain: Secondary | ICD-10-CM

## 2012-10-24 DIAGNOSIS — F411 Generalized anxiety disorder: Secondary | ICD-10-CM

## 2012-10-24 DIAGNOSIS — Z1151 Encounter for screening for human papillomavirus (HPV): Secondary | ICD-10-CM | POA: Insufficient documentation

## 2012-10-24 DIAGNOSIS — R5381 Other malaise: Secondary | ICD-10-CM

## 2012-10-24 DIAGNOSIS — N912 Amenorrhea, unspecified: Secondary | ICD-10-CM

## 2012-10-24 DIAGNOSIS — J309 Allergic rhinitis, unspecified: Secondary | ICD-10-CM

## 2012-10-24 DIAGNOSIS — R102 Pelvic and perineal pain: Secondary | ICD-10-CM

## 2012-10-24 DIAGNOSIS — D353 Benign neoplasm of craniopharyngeal duct: Secondary | ICD-10-CM

## 2012-10-24 DIAGNOSIS — T781XXA Other adverse food reactions, not elsewhere classified, initial encounter: Secondary | ICD-10-CM

## 2012-10-24 HISTORY — DX: Other seasonal allergic rhinitis: J30.2

## 2012-10-24 HISTORY — DX: Allergy to other foods: Z91.018

## 2012-10-24 LAB — COMPREHENSIVE METABOLIC PANEL
ALT: 22 U/L (ref 0–35)
BUN: 15 mg/dL (ref 6–23)
CO2: 23 mEq/L (ref 19–32)
Calcium: 9.1 mg/dL (ref 8.4–10.5)
Chloride: 101 mEq/L (ref 96–112)
Creat: 0.64 mg/dL (ref 0.50–1.10)
Total Bilirubin: 0.5 mg/dL (ref 0.3–1.2)

## 2012-10-24 LAB — POCT WET PREP (WET MOUNT)
Clue Cells Wet Prep Whiff POC: NEGATIVE
WBC, Wet Prep HPF POC: >20

## 2012-10-24 LAB — CBC WITH DIFFERENTIAL/PLATELET
HCT: 36.9 % (ref 36.0–46.0)
Hemoglobin: 12.8 g/dL (ref 12.0–15.0)
Lymphocytes Relative: 31 % (ref 12–46)
Lymphs Abs: 2.6 10*3/uL (ref 0.7–4.0)
Monocytes Absolute: 0.6 10*3/uL (ref 0.1–1.0)
Monocytes Relative: 7 % (ref 3–12)
Neutro Abs: 5 10*3/uL (ref 1.7–7.7)
RBC: 4.11 MIL/uL (ref 3.87–5.11)
WBC: 8.4 10*3/uL (ref 4.0–10.5)

## 2012-10-24 LAB — TSH: TSH: 1.115 u[IU]/mL (ref 0.350–4.500)

## 2012-10-24 LAB — PROLACTIN: Prolactin: 28.7 ng/mL

## 2012-10-24 NOTE — Assessment & Plan Note (Signed)
Her flushing, change in voice, and trouble breathing are very concerning. These sound like the beginnings of anaphylaxis. She has had 2 separate episodes with the second being worse than the first I recommended that if this returns she needs to be seen immediately. It does not matter whether that this is here in the urgent care setting, or at an emergency department but it does return she needs to be seen immediately. She states that her husband has 2 epi-pens at home.

## 2012-10-24 NOTE — Assessment & Plan Note (Signed)
New diagnosis today. Cetirizine to treat.  FU if no improvement.

## 2012-10-24 NOTE — Progress Notes (Signed)
Subjective:    [name] is a 32 y.o. female who presents to [facility] today with complaints of LLQ pain and amenorrhea:  1.  LLQ pain:  Present /walks for exercise, feels pain during intercourse, when walking, or prolonged standing.  Also when sitting forward.  Not present when sitting back or lying down.  Denies any nausea/vomiting.  She was treated empirically for PID with Rocephin and Doxy.  Unable to afford Doxy (>$100), script for Azithromycin sent in but she never filled this.  She denies any vaginal discharge, nausea vomiting currently. She does endorse some constipation. No dysuria. No Genital itching  Does endorse history of hot flashes, intolerance to heat, "retaining fluid," weight gain (has had increase of weight from 150 to 160s from July 2013 to April 2014, but now had 7 lb weight loss since April).  Increased symptoms of depression and low libido since January as well.    Has had two separate episodes of increasing flushing, red rash that sounds like hives, decreased voice, and second time with trouble breathing.  Was not evaluated either time.  This was after eating, both times.  Also with increasing history of rhinorrhea, runny eyes, sneezing, dry hacking cough when she goes outside.  This is new for, never had seasonal allergies.     The following portions of the patient's history were reviewed and updated as appropriate: allergies, current medications, past medical history, family and social history, and problem list. Patient is a nonsmoker.    PMH reviewed.  Past Medical History  Diagnosis Date  . Hx gestational diabetes   . Diabetes mellitus without complication   . Allergy   . Anxiety   . Depression   . Heart murmur    Past Surgical History  Procedure Laterality Date  . Nasal sinus surgery  2003  . Tubal ligation      Medications reviewed. Current Outpatient Prescriptions  Medication Sig Dispense Refill  . azithromycin (ZITHROMAX) 250 MG tablet Take 4  tablets by mouth once, followed by 4 tabs PO in one week.  8 tablet  0  . doxycycline (ADOXA) 100 MG tablet Take 1 tablet (100 mg total) by mouth 2 (two) times daily.  28 tablet  0  . ibuprofen (ADVIL,MOTRIN) 600 MG tablet Take 1 tablet (600 mg total) by mouth every 8 (eight) hours as needed for pain.  30 tablet  0  . meclizine (ANTIVERT) 25 MG tablet Take 1 tablet (25 mg total) by mouth 3 (three) times daily as needed.  30 tablet  0   No current facility-administered medications for this visit.    ROS as above otherwise neg.  No chest pain, palpitations, SOB, Fever, Chills, Abd pain, N/V/D.   Objective:   Physical Exam BP 128/75  Pulse 67  Temp(Src) 98.1 F (36.7 C) (Oral)  Ht 5\' 2"  (1.575 m)  Wt 160 lb 6.4 oz (72.757 kg)  BMI 29.33 kg/m2 Gen:  Alert, cooperative patient who appears stated age in no acute distress.  Vital signs reviewed. HEENT: EOMI, PERRL. Fair dental hygeine. MMM.  Exudates boggy appearing BL.  Neck:  I note some fullness to her neck around her thyroid cartilage -- but I think this is subcutaneous fat as I do not palpate any nodules or fullness inferior to this, where her thyroid gland is actually located.  Cardiac:  Regular rate and rhythm  Pulm:  Clear to auscultation bilaterally with good air movement.  No wheezes or rales noted.   Abd:  Soft/nondistended.  TTP left lower quadrant. No guarding or rebound. This is mostly left inguinal area. GYN:  External genitalia within normal limits.  Vaginal mucosa pink, moist, normal rugae. Cervix appears friable without any overt, visible lesions, no discharge but easy bleeding.  Pap obtained, performed via speculum exam.  Bimanual exam revealed normal, nongravid uterus.  Some cervical motion tenderness. No adnexal masses bilaterally but does have noticeable adnexal tenderness on Left, none on Right.       No results found for this or any previous visit (from the past 72 hour(s)).

## 2012-10-24 NOTE — Assessment & Plan Note (Signed)
Likely contributing to overall issues.

## 2012-10-24 NOTE — Assessment & Plan Note (Signed)
Unclear etiology.   She does have left lower quadrant abdominal pain on abdominal examination as well as adnexal tenderness on the left. As this has continued to persist I am going to refer her for pelvic/transvaginal ultrasound. I also noted a fairly friable cervix on examination. On initial check and did not see a Pap smear since 2010 and therefore obtain one today. Also obtain wet prep which was negative. However on more extensive records I noted she had a normal Pap smear in 2012 well. Even if this returnswithin normal limits I favor colposcopy secondary to friability of the cervix

## 2012-10-24 NOTE — Patient Instructions (Signed)
I see several issues here: 1.  Abdominal pain: -We will get an ultrasound of your pelvis to make sure nothing is going on.  2.  Hot flashes/depression - I do not see that your thyroid was checked last time -- we are doing that today. - Also checking some other labs  3.  Allergies: - I am concerned about what sound like food allergies.  If the trouble breathing starts again, do NOT wait, come back or go to the ED. - I also think you have seasonal allergies.  Take the Cetirizine daily.

## 2012-10-24 NOTE — Assessment & Plan Note (Signed)
Checking TSH and prolactin today.  I do not suspect these to be elevated but want to do due diligence.

## 2012-10-27 ENCOUNTER — Ambulatory Visit (HOSPITAL_COMMUNITY)
Admission: RE | Admit: 2012-10-27 | Discharge: 2012-10-27 | Disposition: A | Discharge status: Home / Self Care | Payer: PRIVATE HEALTH INSURANCE | Source: Ambulatory Visit | Attending: Family Medicine | Admitting: Family Medicine

## 2012-10-27 DIAGNOSIS — N912 Amenorrhea, unspecified: Secondary | ICD-10-CM | POA: Insufficient documentation

## 2012-10-27 DIAGNOSIS — R1032 Left lower quadrant pain: Secondary | ICD-10-CM | POA: Insufficient documentation

## 2012-10-29 ENCOUNTER — Telehealth: Payer: Self-pay | Admitting: Family Medicine

## 2012-10-29 NOTE — Telephone Encounter (Signed)
Ms Maher-Giron's labs and ultrasound's have returned and are completely normal.  Would you guys mind calling her to let her know these results?  The next step would be to see if she is feeling any better.  Thanks,  Trey Paula

## 2012-10-30 NOTE — Telephone Encounter (Signed)
Appreciate call.  Thank you for your help.

## 2012-10-30 NOTE — Telephone Encounter (Signed)
Patient notified.  Patient states she started her period on 10/28/2012 and she states it is a very light menses which is not normal for her.  She is not feeling better.  Patient offered an appt with Dr. Gwendolyn Grant, but declined.  She wants to wait until after her period and see how she is feeling.  Pt instructed to call if no better and make follow up appt.  Pt verbalized understanding.  [name], Darlyne Russian, CMA

## 2012-12-05 ENCOUNTER — Telehealth: Payer: Self-pay | Admitting: *Deleted

## 2012-12-05 NOTE — Telephone Encounter (Signed)
Pt reports that she ate pizza on Sunday night and vomited once , "everytime I eat I feel like I am choking. " Pt reports that she is laying down after she eats and is not eating until 11 pm. Recommended BRAT diet, no eating after 7 pm and to stay up at least 3 hours after eating, do not lay down immediately after eating. Pt verbalized understanding - has appointment for further eval on Thursday. Wyatt Haste, RN-BSN

## 2012-12-07 ENCOUNTER — Ambulatory Visit: Payer: PRIVATE HEALTH INSURANCE

## 2012-12-07 NOTE — Telephone Encounter (Signed)
Pt reports missed appointment this morning due to bad headache . "My eye feels funny and my blood pressure is 90/48 and I feel like I'm going to pass out and sometimes my heart does funny things" Directed pt to go directly to ED for further evaluation for continuing worsening symptoms. Pt verbalized understanding. Wyatt Haste, RN-BSN

## 2012-12-13 ENCOUNTER — Ambulatory Visit (INDEPENDENT_AMBULATORY_CARE_PROVIDER_SITE_OTHER): Payer: PRIVATE HEALTH INSURANCE | Admitting: Family Medicine

## 2012-12-13 ENCOUNTER — Encounter: Payer: Self-pay | Admitting: Family Medicine

## 2012-12-13 VITALS — BP 131/84 | HR 81 | Temp 99.4°F | Wt 165.6 lb

## 2012-12-13 DIAGNOSIS — F411 Generalized anxiety disorder: Secondary | ICD-10-CM

## 2012-12-13 DIAGNOSIS — N949 Unspecified condition associated with female genital organs and menstrual cycle: Secondary | ICD-10-CM

## 2012-12-13 DIAGNOSIS — R300 Dysuria: Secondary | ICD-10-CM

## 2012-12-13 DIAGNOSIS — R102 Pelvic and perineal pain: Secondary | ICD-10-CM

## 2012-12-13 LAB — POCT URINALYSIS DIPSTICK
Blood, UA: NEGATIVE
Ketones, UA: NEGATIVE
Protein, UA: NEGATIVE
Spec Grav, UA: 1.01
Urobilinogen, UA: 0.2

## 2012-12-13 MED ORDER — CITALOPRAM HYDROBROMIDE 20 MG PO TABS: 20.0000 mg | ORAL_TABLET | Freq: Every day | ORAL | Status: DC

## 2012-12-13 NOTE — Progress Notes (Signed)
  Subjective:    Patient ID: [name], female    DOB: 14-Mar-1981, 32 y.o.   MRN: [id number]  HPI  Multiple minor complaints: Leg swelling: Patient reports she has had leg swelling everyday since the 13th. She reports no pain associated with swelling and it improves on its own when she elevates her feet.  BP She reports she takes her blood  pressure frequently and it is fluctuating to much. She is considered. She has a monitor at home for her mother and she has been recently using it on herself. She reports ranges from 98/48 to "as :high as today" 131/84.  Weight gain/loss: She reports she lost 10 pounds two months ago about gained 10 pounds back this month and is worries about her weight.  Heart/anxiety: She continues to rapidly jump from minor compliant to minor compliant. She thinks her blood is sometimes thick in her veins causing her pain by her elbows. She then states she is  mostly worried about her heart. She reports her mother and sisters have problems with their hearts and she thinks she is also having problems. She denies chest pain, dizziness or shortness of breath today. She admits to occasional headache and left arm numbness (entire arm).   Patient's past medical, social, and family history were reviewed and updated as appropriate. Review of Systems Negative, with the exception of above mentioned in HPI     Objective:   Physical Exam BP 131/84  Pulse 81  Temp(Src) 99.4 F (37.4 C) (Oral)  Wt 165 lb 9.6 oz (75.116 kg)  BMI 30.28 kg/m2  Gen: Anxious, figidity in her seat. Speaking rapidly and quickly jumping between complaints. Obviously concerned. CV: RRR.  Chest: CTAB Abd: Soft, NTND. No masses. BS present Ext: No erythema, or edema. No tenderness.  Skin: No obvious rash.

## 2012-12-13 NOTE — Patient Instructions (Signed)
I am starting you on a new medication called Celexa. It will help with your anxiety. You need to take it daily, around the same time. It will take 3-4 weeks to feel the full affects of the medication. It is important not to stop medications without contacting your doctor first.  I will want to see you in two weeks to see how you are doing on the medication and re-check you BP.  Please drink at least 9 glasses of water a day.  Anxiety and Panic Attacks Your caregiver has informed you that you are having an anxiety or panic attack. There may be many forms of this. Most of the time these attacks come suddenly and without warning. They come at any time of day, including periods of sleep, and at any time of life. They may be strong and unexplained. Although panic attacks are very scary, they are physically harmless. Sometimes the cause of your anxiety is not known. Anxiety is a protective mechanism of the body in its fight or flight mechanism. Most of these perceived danger situations are actually nonphysical situations (such as anxiety over losing a job). CAUSES  The causes of an anxiety or panic attack are many. Panic attacks may occur in otherwise healthy people given a certain set of circumstances. There may be a genetic cause for panic attacks. Some medications may also have anxiety as a side effect. SYMPTOMS  Some of the most common feelings are:  Intense terror.  Dizziness, feeling faint.  Hot and cold flashes.  Fear of going crazy.  Feelings that nothing is real.  Sweating.  Shaking.  Chest pain or a fast heartbeat (palpitations).  Smothering, choking sensations.  Feelings of impending doom and that death is near.  Tingling of extremities, this may be from over-breathing.  Altered reality (derealization).  Being detached from yourself (depersonalization). Several symptoms can be present to make up anxiety or panic attacks. DIAGNOSIS  The evaluation by your caregiver will  depend on the type of symptoms you are experiencing. The diagnosis of anxiety or panic attack is made when no physical illness can be determined to be a cause of the symptoms. TREATMENT  Treatment to prevent anxiety and panic attacks may include:  Avoidance of circumstances that cause anxiety.  Reassurance and relaxation.  Regular exercise.  Relaxation therapies, such as yoga.  Psychotherapy with a psychiatrist or therapist.  Avoidance of caffeine, alcohol and illegal drugs.  Prescribed medication. SEEK IMMEDIATE MEDICAL CARE IF:   You experience panic attack symptoms that are different than your usual symptoms.  You have any worsening or concerning symptoms. Document Released: 05/10/2005 Document Revised: 08/02/2011 Document Reviewed: 09/11/2009 [facility] Patient Information 2014 [place], [place].

## 2012-12-14 NOTE — Assessment & Plan Note (Addendum)
-   Discussed in detail with patient that much her behavior seems to be routed to her anxiety. She was more receptive to this idea, than when first introduced by this provider in February. She reports she has thought about it and thinks it may be anxiety. She is now willing to try a medication. - I supported her thought that she may have a real disease process and if she did we would do what we need to help her with it as well. She was soothed by this statement. I asked her NOT to take her BP until we see each other for f/u in 2 weeks. I assured her we would check it at that time, along with other possible studies IF we thought they might me necessary and if she was still having concerns her BP was elevated.  - Explained in detail the use of Celexa and the need to take daily for at least six months, likely life long.  - Started Celexa today for GAD.  - Ideally this patient would benefit from therapy, however I did not want to overwhelm her on this visit and was pleased she excepted to try Celexa. Will offer [facility] or Dr. Pascal Lux (if applicable) on next visit.  - Will need close f/u (2 week) and constant encouragement

## 2013-04-04 ENCOUNTER — Ambulatory Visit: Payer: Self-pay

## 2013-04-04 ENCOUNTER — Other Ambulatory Visit: Payer: Self-pay | Admitting: Occupational Medicine

## 2013-04-04 DIAGNOSIS — R52 Pain, unspecified: Secondary | ICD-10-CM

## 2013-06-29 ENCOUNTER — Ambulatory Visit (INDEPENDENT_AMBULATORY_CARE_PROVIDER_SITE_OTHER): Payer: PRIVATE HEALTH INSURANCE | Admitting: Family Medicine

## 2013-06-29 ENCOUNTER — Ambulatory Visit
Admission: RE | Admit: 2013-06-29 | Discharge: 2013-06-29 | Disposition: A | Discharge status: Home / Self Care | Payer: PRIVATE HEALTH INSURANCE | Source: Ambulatory Visit | Attending: Family Medicine | Admitting: Family Medicine

## 2013-06-29 ENCOUNTER — Ambulatory Visit: Payer: Self-pay

## 2013-06-29 ENCOUNTER — Encounter: Payer: Self-pay | Admitting: Family Medicine

## 2013-06-29 VITALS — BP 130/78 | HR 89 | Temp 98.5°F | Ht 62.0 in | Wt 169.2 lb

## 2013-06-29 DIAGNOSIS — D352 Benign neoplasm of pituitary gland: Secondary | ICD-10-CM

## 2013-06-29 DIAGNOSIS — D353 Benign neoplasm of craniopharyngeal duct: Principal | ICD-10-CM

## 2013-06-29 DIAGNOSIS — H538 Other visual disturbances: Secondary | ICD-10-CM

## 2013-06-29 LAB — CMP AND LIVER
ALBUMIN: 3.9 g/dL (ref 3.5–5.2)
ALK PHOS: 81 U/L (ref 39–117)
ALT: 23 U/L (ref 0–35)
AST: 17 U/L (ref 0–37)
BUN: 17 mg/dL (ref 6–23)
Bilirubin, Direct: <0.1 mg/dL (ref 0.0–0.3)
CALCIUM: 9.3 mg/dL (ref 8.4–10.5)
CHLORIDE: 104 meq/L (ref 96–112)
CO2: 27 mEq/L (ref 19–32)
Creat: 0.68 mg/dL (ref 0.50–1.10)
Glucose, Bld: 107 mg/dL — ABNORMAL HIGH (ref 70–99)
POTASSIUM: 3.8 meq/L (ref 3.5–5.3)
SODIUM: 139 meq/L (ref 135–145)
TOTAL PROTEIN: 7.1 g/dL (ref 6.0–8.3)
Total Bilirubin: 0.3 mg/dL (ref 0.2–1.2)

## 2013-06-29 MED ORDER — GADOBENATE DIMEGLUMINE 529 MG/ML IV SOLN
10.0000 mL | Freq: Once | INTRAVENOUS | Status: AC | PRN
  Administered 2013-06-29: 10 mL via INTRAVENOUS

## 2013-06-29 NOTE — Patient Instructions (Signed)
Headaches - check MRI of brain as well as lab work to evaluate for recurrence of microadenoma.

## 2013-06-29 NOTE — Assessment & Plan Note (Signed)
Patient presents for evaluation of headache and vision changes. Symptoms are consistent with those with previous pituitary adenoma. -Patient will be sent for MRI brain to evaluate recurrence of pituitary adenoma -Will check prolactin level  -Will check CMP given swelling

## 2013-06-29 NOTE — Progress Notes (Addendum)
   Subjective:    Patient ID: [name], female    DOB: November 18, 1980, 33 y.o.   MRN: [id number]  HPI 33 year old Hispanic female with past medical history of pituitary adenoma presents for evaluation of headache and vision changes. Patient reports that approximately 2 weeks ago she noticed increasing breast fullness and tenderness bilaterally, she reports similar symptoms prior to previous menses however symptoms were more intense, patient states that these symptoms lasted until she began her period on 06/22/2013, patient noted that she developed headache 2 days before the start of her period which she describes as a pressure type sensation that is frontal and bilateral temporal, she reports intermittent stabbing sensations, she reports associated vision changes that worsened over the past 4 days, describes bilateral blurry vision and relates her symptoms to "being underwater ", blurry vision is worse in her right eye, patient completed her period 2 days ago however her symptoms have persisted, she did attempt a trial of 1000 mg acetaminophen 2 days ago which did not relieve her symptoms, she reports that her symptoms are similar to headaches that she had at the initial time of diagnosis of pituitary adenoma, patient reports that her last MRI was in 2013 and showed decreased size of the adenoma, patient denies associated numbness, tingling, or weakness of her extremities, no facial droop, no slurred speech, does report 2 week history of increased swelling of extremities and face  Meds-patient reports discontinuing all of her medications   Review of Systems  Constitutional: Negative for fever, chills and fatigue.  Eyes: Positive for photophobia and visual disturbance. Negative for pain and discharge.  Respiratory: Negative for chest tightness.   Cardiovascular: Positive for leg swelling. Negative for chest pain.  Neurological: Negative for dizziness, tremors, seizures, syncope, speech difficulty,  weakness, light-headedness and numbness.       Objective:   Physical Exam Vitals: Reviewed General: Pleasant Hispanic female, no acute distress HEENT: Normocephalic, pupils equal round and reactive to light, extraocular movements are intact, no peripheral vision disturbances noted, patient has 20/25 vision of bilateral eyes, funduscopic examination attempted however unable to visualize fundus secondary to non-dilated eye exam, nasal septum midline, voice mucous membranes, uvula midline, no pharyngeal erythema or exudate noted, neck was supple, muscle tightness noted in bilateral cervical paraspinal muscles, no thyroid nodules Cardiac: Regular rate and rhythm, S1 and S2 present, no murmurs, no heaves or thrills Respiratory: Clear to auscultation bilaterally, normal effort Neuro: Cranial nerves II through XII intact, strength was 5 out of 5 in all extremities, sensation to light touch was intact in all extremities, normal gait, 2+ brachial, biceps, patellar reflexes  Suboccipital release attempted with no relief of headache.   MRI brain performed in November 2013 with and without contrast showed decreasing size of pituitary adenoma    Assessment & Plan:  Please see problem specific assessment and plan.

## 2013-06-30 LAB — PROLACTIN: Prolactin: 18.9 ng/mL

## 2013-07-01 ENCOUNTER — Telehealth: Payer: Self-pay | Admitting: Family Medicine

## 2013-07-01 ENCOUNTER — Encounter: Payer: Self-pay | Admitting: Family Medicine

## 2013-07-01 NOTE — Telephone Encounter (Signed)
Unable to reach patient by phone as both home and cell phone numbers listed are invalid. Will send letter.

## 2013-07-06 ENCOUNTER — Telehealth: Payer: Self-pay | Admitting: Family Medicine

## 2013-07-06 NOTE — Telephone Encounter (Signed)
I  Myself did not see this patient and visit was conducted by Dr. Ree Kida, per documented note. Again, per additional documented phone note on 2/8, by Dr. Ree Kida, this has been taken care of already. It would appear we do not have a valid phone number for this patient to call her back so he sent a letter. If she calls back you can tell her this AND update her contact info. Thanks.

## 2013-07-06 NOTE — Telephone Encounter (Signed)
Fwd to PCP

## 2013-07-06 NOTE — Telephone Encounter (Signed)
Pt called because she is awaiting the results of her lab work and MRI told to her. jw

## 2013-08-01 ENCOUNTER — Encounter: Payer: Self-pay | Admitting: Family Medicine

## 2013-08-01 ENCOUNTER — Ambulatory Visit (INDEPENDENT_AMBULATORY_CARE_PROVIDER_SITE_OTHER): Payer: Self-pay | Admitting: Family Medicine

## 2013-08-01 VITALS — BP 126/70 | HR 76 | Temp 98.7°F | Ht 62.0 in | Wt 167.0 lb

## 2013-08-01 DIAGNOSIS — K8020 Calculus of gallbladder without cholecystitis without obstruction: Secondary | ICD-10-CM

## 2013-08-01 DIAGNOSIS — R1011 Right upper quadrant pain: Secondary | ICD-10-CM

## 2013-08-01 DIAGNOSIS — K8050 Calculus of bile duct without cholangitis or cholecystitis without obstruction: Secondary | ICD-10-CM

## 2013-08-01 LAB — CBC
HEMATOCRIT: 37.9 % (ref 36.0–46.0)
Hemoglobin: 13 g/dL (ref 12.0–15.0)
MCH: 31.7 pg (ref 26.0–34.0)
MCHC: 34.3 g/dL (ref 30.0–36.0)
MCV: 92.4 fL (ref 78.0–100.0)
PLATELETS: 296 10*3/uL (ref 150–400)
RBC: 4.1 MIL/uL (ref 3.87–5.11)
RDW: 13 % (ref 11.5–15.5)
WBC: 7.9 10*3/uL (ref 4.0–10.5)

## 2013-08-01 LAB — COMPLETE METABOLIC PANEL WITH GFR
ALBUMIN: 3.6 g/dL (ref 3.5–5.2)
ALT: 20 U/L (ref 0–35)
AST: 15 U/L (ref 0–37)
Alkaline Phosphatase: 89 U/L (ref 39–117)
BILIRUBIN TOTAL: 0.2 mg/dL — AB (ref 0.3–1.2)
BUN: 17 mg/dL (ref 6–23)
CO2: 26 mEq/L (ref 19–32)
Calcium: 9.7 mg/dL (ref 8.4–10.5)
Chloride: 99 mEq/L (ref 96–112)
Creat: 0.62 mg/dL (ref 0.50–1.10)
GFR, Est African American: >89 mL/min
GLUCOSE: 112 mg/dL — AB (ref 70–99)
Potassium: 4.3 mEq/L (ref 3.5–5.3)
Sodium: 138 mEq/L (ref 135–145)
Total Protein: 7.2 g/dL (ref 6.0–8.3)

## 2013-08-01 LAB — PROTIME-INR
INR: 0.94 (ref <1.50)
Prothrombin Time: 12.4 seconds (ref 11.6–15.2)

## 2013-08-01 MED ORDER — ONDANSETRON 4 MG PO TBDP: 4.0000 mg | ORAL_TABLET | Freq: Three times a day (TID) | ORAL | Status: DC | PRN

## 2013-08-01 MED ORDER — OXYCODONE-ACETAMINOPHEN 5-325 MG PO TABS: 1.0000 | ORAL_TABLET | Freq: Three times a day (TID) | ORAL | Status: DC | PRN

## 2013-08-01 NOTE — Patient Instructions (Signed)
It was nice to see you today.  You appear to have cholelithiasis/biliary colic.  I will treat your pain and will also obtain an [place] today.  Please avoid fatty meals.  I am also placing a referral to general surgery so that you can talk about gall bladder removal.

## 2013-08-01 NOTE — Progress Notes (Signed)
   Subjective:    Patient ID: [name], female    DOB: Nov 11, 1980, 33 y.o.   MRN: [id number]  HPI 33 year old Hispanic female presents for evaluation of RUQ pain.  1) RUQ Pain  This past Saturday she states she ate chicken wings.  Following her meal she developed epigastric pain which then migrated to the right upper quadrant.  Pain was moderate to severe and described as crampy in character.  She reports associated nausea. Denies vomiting, diarrhea.  She also denies any associated fever.  She has had some intermittent shortness of breath associated with the pain as well as chills (also associated with the pain).  Pain is exacerbated by PO intake.  No relieving factors.  Pain has continued to persist, prompting her to seek treatment.   Patient reports that she has had this previously on several occasions.   Past Medical History  Diagnosis Date  . Hx gestational diabetes   . Diabetes mellitus without complication   . Allergy   . Anxiety   . Depression   . Heart murmur    Review of Systems Per HPI    Objective:   Physical Exam Filed Vitals:   08/01/13 1451  BP: 126/70  Pulse: 76  Temp: 98.7 F (37.1 C)   Exam: General: well appearing, but does appear to be in mild-moderate discomfort.  Cardiovascular: RRR. No murmurs, rubs, or gallops. Respiratory: CTAB. No rales, rhonchi, or wheeze. Abdomen: soft, nondistended. + RUQ tenderness. Negative Murphy sign.  No guarding or rebound.     Assessment & Plan:  See Problem List

## 2013-08-01 NOTE — Assessment & Plan Note (Signed)
Patient with history and physical exam consistent with biliary colic.  Patient has been afebrile; there are no current findings consistent with acute cholecystitis. Obtaining right upper quadrant ultrasound.  Also obtaining labs: CBC, CMP, PT/INR. Given prior occurrences and current exaceration, will place referral to general surgery for evaluation and hopefully elective cholecystectomy in the near future. Treating patient's symptoms with Percocet and Zofran.  Instructed to avoid fatty meals and to be sure to intake plenty of fluids.

## 2013-08-03 ENCOUNTER — Telehealth: Payer: Self-pay | Admitting: Family Medicine

## 2013-08-03 ENCOUNTER — Ambulatory Visit
Admission: RE | Admit: 2013-08-03 | Discharge: 2013-08-03 | Disposition: A | Discharge status: Home / Self Care | Payer: PRIVATE HEALTH INSURANCE | Source: Ambulatory Visit | Attending: Family Medicine | Admitting: Family Medicine

## 2013-08-03 DIAGNOSIS — R1011 Right upper quadrant pain: Secondary | ICD-10-CM

## 2013-08-03 MED ORDER — POLYETHYLENE GLYCOL 3350 17 GM/SCOOP PO POWD: 17.0000 g | Freq: Two times a day (BID) | ORAL | Status: DC | PRN

## 2013-08-03 NOTE — Telephone Encounter (Signed)
Pt came by stating that she is not feeling better and it seems as if the pain medication is not working, constipation is worst and also like a call concerning her labs.

## 2013-08-03 NOTE — Telephone Encounter (Signed)
Please advise.Thank you.[name] S  

## 2013-08-03 NOTE — Telephone Encounter (Signed)
Spoke with patient and informed her of below 

## 2013-08-03 NOTE — Telephone Encounter (Signed)
Her Labs were normal.  She may take 2 Percocet at at time if needed.  If pain continues to be severe, she should be seen. I will call in Miralax for constipation.

## 2013-08-06 ENCOUNTER — Ambulatory Visit: Payer: Self-pay | Admitting: Family Medicine

## 2013-08-20 ENCOUNTER — Ambulatory Visit (INDEPENDENT_AMBULATORY_CARE_PROVIDER_SITE_OTHER): Payer: PRIVATE HEALTH INSURANCE | Admitting: Surgery

## 2013-08-20 ENCOUNTER — Other Ambulatory Visit (INDEPENDENT_AMBULATORY_CARE_PROVIDER_SITE_OTHER): Payer: Self-pay | Admitting: Surgery

## 2013-08-20 ENCOUNTER — Encounter (INDEPENDENT_AMBULATORY_CARE_PROVIDER_SITE_OTHER): Payer: Self-pay | Admitting: Surgery

## 2013-08-20 VITALS — BP 122/81 | HR 72 | Temp 98.6°F | Resp 18 | Ht 62.0 in | Wt 168.0 lb

## 2013-08-20 DIAGNOSIS — R1011 Right upper quadrant pain: Secondary | ICD-10-CM

## 2013-08-20 NOTE — Progress Notes (Signed)
Patient ID: [name], female   DOB: Apr 28, 1981, 33 y.o.   MRN: [id number]  Chief Complaint  Patient presents with  . Abdominal Pain    RUQ abdominal pain    HPI [name] is a 33 y.o. female.   HPI This is a pleasant female referred by the of residency teaching service, Dr. Jene Every for evaluation of right fourth abdominal pain. She has actually had this intermittently for many years. She has had at least 6 separate ultrasounds of her gallbladder which all have been unremarkable with no evidence of gallstones or gallbladder wall thickening. She has never had any further workup ordered including a HIDA scan.  She has right upper quadrant abdominal pain after fatty meals with occasional nausea but no emesis. The pain is described as cramping with spasm. He does not referring your else. Bowel movements are normal. She is otherwise without complaints Past Medical History  Diagnosis Date  . Hx gestational diabetes   . Diabetes mellitus without complication   . Allergy   . Anxiety   . Depression   . Heart murmur     Past Surgical History  Procedure Laterality Date  . Nasal sinus surgery  2003  . Tubal ligation    . Cesarean section      Family History  Problem Relation Age of Onset  . Hypertension Mother   . Hyperlipidemia Mother   . Diabetes Other     Social History History  Substance Use Topics  . Smoking status: Never Smoker   . Smokeless tobacco: Never Used  . Alcohol Use: 8.4 oz/week    14 Cans of beer per week     Comment: 1-2 drinks daily, bindge (>6 at one sitting monthly)    Allergies  Allergen Reactions  . Amoxicillin Hives and Swelling  . Penicillins     Current Outpatient Prescriptions  Medication Sig Dispense Refill  . citalopram (CELEXA) 20 MG tablet Take 1 tablet (20 mg total) by mouth daily.  30 tablet  0  . doxycycline (ADOXA) 100 MG tablet Take 1 tablet (100 mg total) by mouth 2 (two) times daily.  28 tablet  0  . ibuprofen (ADVIL,MOTRIN) 600 MG  tablet Take 1 tablet (600 mg total) by mouth every 8 (eight) hours as needed for pain.  30 tablet  0  . ondansetron (ZOFRAN ODT) 4 MG disintegrating tablet Take 1 tablet (4 mg total) by mouth every 8 (eight) hours as needed for nausea or vomiting.  20 tablet  0  . oxyCODONE-acetaminophen (ROXICET) 5-325 MG per tablet Take 1 tablet by mouth every 8 (eight) hours as needed for severe pain.  30 tablet  0  . polyethylene glycol powder (GLYCOLAX/MIRALAX) powder Take 17 g by mouth 2 (two) times daily as needed.  3350 g  1   No current facility-administered medications for this visit.    Review of Systems Review of Systems  Constitutional: Negative for fever, chills and unexpected weight change.  HENT: Negative for congestion, hearing loss, sore throat, trouble swallowing and voice change.   Eyes: Negative for visual disturbance.  Respiratory: Negative for cough and wheezing.   Cardiovascular: Negative for chest pain, palpitations and leg swelling.  Gastrointestinal: Positive for nausea and abdominal pain. Negative for vomiting, diarrhea, constipation, blood in stool, abdominal distention and anal bleeding.  Genitourinary: Negative for hematuria, vaginal bleeding and difficulty urinating.  Musculoskeletal: Negative for arthralgias.  Skin: Negative for rash and wound.  Neurological: Negative for seizures, syncope and  headaches.  Hematological: Negative for adenopathy. Does not bruise/bleed easily.  Psychiatric/Behavioral: Negative for confusion.    Blood pressure 122/81, pulse 72, temperature 98.6 F (37 C), temperature source Temporal, resp. rate 18, height 5\' 2"  (1.575 m), weight 168 lb (76.204 kg).  Physical Exam Physical Exam  Constitutional: She is oriented to person, place, and time. She appears well-developed and well-nourished. No distress.  HENT:  Head: Normocephalic and atraumatic.  Right Ear: External ear normal.  Left Ear: External ear normal.  Nose: Nose normal.  Mouth/Throat:  Oropharynx is clear and moist. No oropharyngeal exudate.  Eyes: Conjunctivae are normal. Pupils are equal, round, and reactive to light. Right eye exhibits no discharge. Left eye exhibits no discharge. No scleral icterus.  Neck: Normal range of motion. Neck supple. No tracheal deviation present.  Cardiovascular: Normal rate, regular rhythm, normal heart sounds and intact distal pulses.   No murmur heard. Pulmonary/Chest: Effort normal and breath sounds normal. No respiratory distress. She has no wheezes. She has no rales.  Abdominal: Soft. Bowel sounds are normal. She exhibits no distension. There is no tenderness. There is no rebound.  Musculoskeletal: Normal range of motion. She exhibits no edema.  Lymphadenopathy:    She has no cervical adenopathy.  Neurological: She is alert and oriented to person, place, and time.  Skin: Skin is warm and dry. No rash noted. She is not diaphoretic. No erythema.  Psychiatric: Her behavior is normal. Judgment normal.    Data Reviewed Again, I have reviewed all her ultrasounds showing a normal gallbladder with no gallstones, no gallbladder wall thickening, and a normal bile duct  Assessment    Right upper quadrant abdominal pain of uncertain etiology     Plan    This certainly does sound like it is related to the gallbladder but her workup is not complete. She needs a HIDA scan to see if she has biliary dyskinesia. If the scan is normal, she will need referral to a gastroenterologist for consideration of endoscopy. I will see her back after the scan is complete to discuss whether or not she needs surgery        Abhijay Morriss A 08/20/2013, 3:36 PM

## 2013-08-21 ENCOUNTER — Telehealth (INDEPENDENT_AMBULATORY_CARE_PROVIDER_SITE_OTHER): Payer: Self-pay | Admitting: *Deleted

## 2013-08-21 NOTE — Telephone Encounter (Signed)
Tried to call pt.  Her voicemail was full and could not leave a message.  If pt calls, I needed to get the number off of the back of her insurance card for pre-cert of [place] Hepato W/Eject Frac test ordered by Dr. Ninfa Linden.  Anderson [place]

## 2013-08-27 ENCOUNTER — Ambulatory Visit (HOSPITAL_COMMUNITY)
Admission: RE | Admit: 2013-08-27 | Discharge: 2013-08-27 | Disposition: A | Discharge status: Home / Self Care | Payer: PRIVATE HEALTH INSURANCE | Source: Ambulatory Visit | Attending: Surgery | Admitting: Surgery

## 2013-08-27 DIAGNOSIS — R1011 Right upper quadrant pain: Secondary | ICD-10-CM | POA: Insufficient documentation

## 2013-08-27 MED ORDER — SINCALIDE 5 MCG IJ SOLR
0.0200 ug/kg | Freq: Once | INTRAMUSCULAR | Status: AC
  Administered 2013-08-27: 1.5 ug via INTRAVENOUS

## 2013-08-27 MED ORDER — STERILE WATER FOR INJECTION IJ SOLN
INTRAMUSCULAR | Status: AC
  Filled 2013-08-27: qty 10

## 2013-08-27 MED ORDER — SINCALIDE 5 MCG IJ SOLR
INTRAMUSCULAR | Status: AC
  Administered 2013-08-27: 1.5 ug via INTRAVENOUS
  Filled 2013-08-27: qty 5

## 2013-08-29 ENCOUNTER — Telehealth (INDEPENDENT_AMBULATORY_CARE_PROVIDER_SITE_OTHER): Payer: Self-pay | Admitting: General Surgery

## 2013-08-29 NOTE — Telephone Encounter (Signed)
Message copied by Maryclare Bean on Wed Aug 29, 2013 11:31 AM ------      Message from: Humphrey Rolls K      Created: Wed Aug 29, 2013 10:18 AM      Regarding: Dr Ninfa Linden      Contact: [phone]       Wants Hepato scan result from [facility] 08/27/13       ------

## 2013-08-29 NOTE — Telephone Encounter (Signed)
Return patient call about her Hepato Scan and I went over the % of the test and that Dr Ninfa Linden will have to go over more the test with her, she has a follow up apt on 09-04-13 to come back to see Dr Ninfa Linden

## 2013-08-31 ENCOUNTER — Encounter (HOSPITAL_COMMUNITY): Payer: Self-pay | Admitting: Emergency Medicine

## 2013-08-31 ENCOUNTER — Emergency Department (HOSPITAL_COMMUNITY)
Admission: EM | Admit: 2013-08-31 | Discharge: 2013-08-31 | Disposition: A | Discharge status: Home / Self Care | Payer: PRIVATE HEALTH INSURANCE | Attending: Emergency Medicine | Admitting: Emergency Medicine

## 2013-08-31 DIAGNOSIS — E119 Type 2 diabetes mellitus without complications: Secondary | ICD-10-CM | POA: Insufficient documentation

## 2013-08-31 DIAGNOSIS — Z880 Allergy status to penicillin: Secondary | ICD-10-CM | POA: Insufficient documentation

## 2013-08-31 DIAGNOSIS — R1110 Vomiting, unspecified: Secondary | ICD-10-CM

## 2013-08-31 DIAGNOSIS — F3289 Other specified depressive episodes: Secondary | ICD-10-CM | POA: Insufficient documentation

## 2013-08-31 DIAGNOSIS — R011 Cardiac murmur, unspecified: Secondary | ICD-10-CM | POA: Insufficient documentation

## 2013-08-31 DIAGNOSIS — Z79899 Other long term (current) drug therapy: Secondary | ICD-10-CM | POA: Insufficient documentation

## 2013-08-31 DIAGNOSIS — R1011 Right upper quadrant pain: Secondary | ICD-10-CM | POA: Insufficient documentation

## 2013-08-31 DIAGNOSIS — R638 Other symptoms and signs concerning food and fluid intake: Secondary | ICD-10-CM | POA: Insufficient documentation

## 2013-08-31 DIAGNOSIS — F411 Generalized anxiety disorder: Secondary | ICD-10-CM | POA: Insufficient documentation

## 2013-08-31 DIAGNOSIS — Z3202 Encounter for pregnancy test, result negative: Secondary | ICD-10-CM | POA: Insufficient documentation

## 2013-08-31 DIAGNOSIS — F329 Major depressive disorder, single episode, unspecified: Secondary | ICD-10-CM | POA: Insufficient documentation

## 2013-08-31 DIAGNOSIS — R42 Dizziness and giddiness: Secondary | ICD-10-CM | POA: Insufficient documentation

## 2013-08-31 DIAGNOSIS — R6883 Chills (without fever): Secondary | ICD-10-CM | POA: Insufficient documentation

## 2013-08-31 DIAGNOSIS — R112 Nausea with vomiting, unspecified: Secondary | ICD-10-CM | POA: Insufficient documentation

## 2013-08-31 DIAGNOSIS — Z8719 Personal history of other diseases of the digestive system: Secondary | ICD-10-CM | POA: Insufficient documentation

## 2013-08-31 LAB — COMPREHENSIVE METABOLIC PANEL
ALK PHOS: 87 U/L (ref 39–117)
ALT: 20 U/L (ref 0–35)
AST: 17 U/L (ref 0–37)
Albumin: 3.8 g/dL (ref 3.5–5.2)
BUN: 14 mg/dL (ref 6–23)
CALCIUM: 9.7 mg/dL (ref 8.4–10.5)
CO2: 24 mEq/L (ref 19–32)
Chloride: 101 mEq/L (ref 96–112)
Creatinine, Ser: 0.65 mg/dL (ref 0.50–1.10)
GFR calc Af Amer: >90 mL/min (ref >90)
GFR calc non Af Amer: >90 mL/min (ref >90)
GLUCOSE: 100 mg/dL — AB (ref 70–99)
POTASSIUM: 4.5 meq/L (ref 3.7–5.3)
SODIUM: 140 meq/L (ref 137–147)
TOTAL PROTEIN: 7.7 g/dL (ref 6.0–8.3)
Total Bilirubin: 0.2 mg/dL — ABNORMAL LOW (ref 0.3–1.2)

## 2013-08-31 LAB — URINALYSIS, ROUTINE W REFLEX MICROSCOPIC
BILIRUBIN URINE: NEGATIVE
Glucose, UA: NEGATIVE mg/dL
HGB URINE DIPSTICK: NEGATIVE
Ketones, ur: NEGATIVE mg/dL
Nitrite: NEGATIVE
Protein, ur: NEGATIVE mg/dL
SPECIFIC GRAVITY, URINE: 1.013 (ref 1.005–1.030)
UROBILINOGEN UA: 0.2 mg/dL (ref 0.0–1.0)
pH: 5 (ref 5.0–8.0)

## 2013-08-31 LAB — CBC WITH DIFFERENTIAL/PLATELET
BASOS ABS: 0 10*3/uL (ref 0.0–0.1)
Basophils Relative: 0 % (ref 0–1)
EOS PCT: 2 % (ref 0–5)
Eosinophils Absolute: 0.2 10*3/uL (ref 0.0–0.7)
HCT: 37.8 % (ref 36.0–46.0)
Hemoglobin: 13.2 g/dL (ref 12.0–15.0)
LYMPHS ABS: 3.3 10*3/uL (ref 0.7–4.0)
Lymphocytes Relative: 36 % (ref 12–46)
MCH: 32.3 pg (ref 26.0–34.0)
MCHC: 34.9 g/dL (ref 30.0–36.0)
MCV: 92.4 fL (ref 78.0–100.0)
Monocytes Absolute: 0.6 10*3/uL (ref 0.1–1.0)
Monocytes Relative: 6 % (ref 3–12)
NEUTROS PCT: 56 % (ref 43–77)
Neutro Abs: 5.1 10*3/uL (ref 1.7–7.7)
PLATELETS: 279 10*3/uL (ref 150–400)
RBC: 4.09 MIL/uL (ref 3.87–5.11)
RDW: 13.2 % (ref 11.5–15.5)
WBC: 9.2 10*3/uL (ref 4.0–10.5)

## 2013-08-31 LAB — URINE MICROSCOPIC-ADD ON

## 2013-08-31 LAB — PREGNANCY, URINE: Preg Test, Ur: NEGATIVE

## 2013-08-31 LAB — LIPASE, BLOOD: Lipase: 40 U/L (ref 11–59)

## 2013-08-31 MED ORDER — SODIUM CHLORIDE 0.9 % IV BOLUS (SEPSIS)
1000.0000 mL | Freq: Once | INTRAVENOUS | Status: AC
  Administered 2013-08-31: 1000 mL via INTRAVENOUS

## 2013-08-31 MED ORDER — ONDANSETRON HCL 4 MG/2ML IJ SOLN
4.0000 mg | Freq: Once | INTRAMUSCULAR | Status: AC
  Administered 2013-08-31: 4 mg via INTRAVENOUS
  Filled 2013-08-31: qty 2

## 2013-08-31 MED ORDER — HYDROCODONE-ACETAMINOPHEN 5-325 MG PO TABS: 2.0000 | ORAL_TABLET | ORAL | Status: DC | PRN

## 2013-08-31 MED ORDER — FENTANYL CITRATE 0.05 MG/ML IJ SOLN
75.0000 ug | Freq: Once | INTRAMUSCULAR | Status: AC
  Administered 2013-08-31: 75 ug via INTRAVENOUS
  Filled 2013-08-31: qty 2

## 2013-08-31 MED ORDER — ONDANSETRON 4 MG PO TBDP: ORAL_TABLET | ORAL | Status: DC

## 2013-08-31 NOTE — Discharge Instructions (Signed)
Avoid any fatty foods. For severe pain take norco or vicodin however realize they have the potential for addiction and it can make you sleepy and has tylenol in it.  No operating machinery while taking. Call. surgery in their office on Monday to discuss surgery options.   Return to the ER she develop uncontrollable pain, fevers or worsening symptoms.   Results and differential diagnosis were discussed with the patient. Close follow up outpatient was discussed, patient comfortable with the plan.   Filed Vitals:   08/31/13 1547  BP: 123/72  Pulse: 70  Temp: 98.1 F (36.7 C)  Resp: 18  Height: 5\' 2"  (1.575 m)  Weight: 167 lb (75.751 kg)  SpO2: 06%     Clico biliar (Biliary Colic)  El clico biliar es un dolor continuo o irregular en la zona superior del abdomen. Generalmente se ubica debajo de la zona derecha de la caja torcica. Aparece cuando los clculos biliares interfieren con el flujo normal de la bilis que proviene de la vescula. La bilis es un lquido que interviene en la digestin de las [place]. Se produce en el hgado y se almacena en la vescula. Al comer, La bilis pasa desde la vescula, a travs del conducto cstico y el conducto biliar comn al intestino delgado. All se mezcla con la comida parcialmente digerida. Si un clculo obstruye alguno de esos conductos, se detiene el flujo normal de bilis. Las clulas del conducto biliar se contraen con fuerza para mover el clculo. Esto causa el dolor del clico biliar.  SNTOMAS  El paciente se queja de dolor en la zona superior del abdomen. El dolor puede ser:  En el centro de la zona superior del abdomen, justo por debajo del esternn.  En la zona superior derecha del abdomen, donde se encuentra la vescula biliar y el hgado.  Se expande hacia la espalda, hacia el omplato derecho.  Nuseas y vmitos  El dolor comienza generalmente despus de comer.  El clico biliar aparece como una demanda de bilis por parte del  sistema digestivo. La demanda de bilis es mayor luego de ingerir alimentos ricos en grasas. Los sntomas tambin Geophysicist/field seismologist que han estado ayunando e ingieren abruptamente una comida abundante. La mayora de los episodios de clico biliar mejoran luego de 1 a 5 horas. Despus que se alivia el dolor ms intenso, podr seguir sintiendo un dolor moderado en el abdomen durante un lapso de 24 horas. DIAGNSTICO Luego de escuchar la descripcin de los sntomas, el mdico realizar un examen fsico. Deber prestar atencin a la zona superior del abdomen. Esta es la zona en la que se encuentra el hgado y la vescula biliar. El mdico podr observar los clculos a travs de una ecografa. Tambin le realizaran un escaneo especializado de la vescula biliar. Le indicarn anlisis de [place], especialmente si tiene fiebre o el dolor persiste. PREVENCIN El clico biliar puede evitarse controlando los factores de riesgo que favorecen los clculos. Algunos de [employer] factores de riesgo como la herencia, el aumento de la edad y Water quality scientist son aspectos normales de la vida. La obesidad y [place] dieta rica en grasas son factores de riesgo que usted puede modificar a travs de cambios hacia un estilo de vida saludable. Las mujeres que atraviesan la menopausia y que reciben terapia de reemplazo hormonal (estrgenos) tambin tienen ms riesgo de Actor clicos biliares. TRATAMIENTO  Le prescribirn analgsicos.  Le indicarn una dieta sin grasas.  Si el primer episodio es intenso, o si  los clicos aparecen nuevamente, generalmente se indica la ciruga para extirpar la vescula (colecistectoma). Este procedimiento puede realizarse a travs de pequeas incisiones utilizando un instrumento denominado laparoscopio. Muchas veces se requiere una breve estada en el hospital. Algunas personas reciben el alta el mismo da. Es el tratamiento ms ampliamente utilizado en personas que sufren dolor por clculos  biliares. Es efectivo y seguro, no tiene complicaciones en ms del 90% de los [place].  Si la ciruga no puede llevarse a cabo, podrn utilizarse medicamentos para [employer] clculos. Esta medicacin es cara y puede demorar meses o aos hasta que [place]. Slo podr disolver clculos pequeos.  En algunos casos raros, se combinan estos medicamentos con un procedimiento denominado litotricia por ondas de choque. Este procedimiento [place] ondas de choque cuidadosamente dirigidas a romper los clculos. En muchas personas tratadas con este procedimiento, los clculos vuelven a formarse luego de [employer]. PRONSTICO Si los clculos obstruyen el conducto cstico o conducto biliar comn, usted tiene el riesgo de sufrir episodios repetidos de clicos biliares. Tambin existe un 25% de probabilidades de desarrollar una infeccin de la vescula biliar (colecistitisaguda) o alguna otra complicacin en los siguientes 10 a 20 aos. Si ha sido sometido a [place], progrmela para el momento en que sea conveniente para usted, y para cuando no se encuentre enfermo. INSTRUCCIONES PARA EL CUIDADO DOMICILIARIO  Beba gran cantidad de lquidos claros.  Evite las comidas con mucha grasa o fritas, o cualquier alimento que empeore su dolor.  Tome los medicamentos como se le indic. SOLICITE ATENCIN MDICA SI:  Le sube la temperatura a ms de 100.5 F (38.1 C).  El dolor empeora con el [place].  Siente nuseas y [employer] comer y beber.  Tiene vmitos. SOLICITE ATENCIN MDICA DE INMEDIATO SI:  Siente dolor continuo e intenso en el abdomen, que no se alivia con medicamentos.  Siente nuseas y vmitos que no mejoran con medicamentos.  Tiene sntomas de clico biliar y comienza a Irene Shipper y escalofros. Esto puede ser un indicio de que ha desarrollado colecistitis. Comunquese con su mdico inmediatamente.  Su piel o la parte blanca del ojo se vuelven amarillas (ictericia). Document  Released: 08/17/2007 Document Revised: 08/02/2011 [facility] Patient Information 2014 [place], [place].

## 2013-08-31 NOTE — ED Notes (Signed)
The pt has had abd for  1-2 months.  She has had more pain for the past 3 days.  She has seen her doctor and has seen a surgeon for gb problems she also had a scan this past Wednesday.  lmp last month.

## 2013-08-31 NOTE — ED Notes (Signed)
The pts pain is rt lat abd and rt shoulder pain with nausea

## 2013-08-31 NOTE — ED Provider Notes (Signed)
CSN: [id number]     Arrival date & time 08/31/13  1536 History   First MD Initiated Contact with Patient 08/31/13 1849     Chief Complaint  Patient presents with  . Abdominal Pain     (Consider location/radiation/quality/duration/timing/severity/associated sxs/prior Treatment) HPI Comments: 33 year old female with history of fatty liver disease, pituitary adenoma, biliary colic presents with recurrent right upper quadrant pain, nausea and lightheadedness. Similar to previous however worse. Worse after eating. Patient has had multiple ultrasounds and evaluations for this and recently had a hiatus scan which was told was abnormal and had followup with surgery. Patient could not wait for followup for surgery as her symptoms are becoming more severe and she cannot tolerate food. Pain radiates to the right shoulder into the back. No urinary symptoms, no fever, mild chills.  Patient is a 33 y.o. female presenting with abdominal pain. The history is provided by the patient.  Abdominal Pain Associated symptoms: chills, nausea and vomiting   Associated symptoms: no chest pain, no dysuria, no fever and no shortness of breath     Past Medical History  Diagnosis Date  . Hx gestational diabetes   . Diabetes mellitus without complication   . Allergy   . Anxiety   . Depression   . Heart murmur    Past Surgical History  Procedure Laterality Date  . Nasal sinus surgery  2003  . Tubal ligation    . Cesarean section     Family History  Problem Relation Age of Onset  . Hypertension Mother   . Hyperlipidemia Mother   . Diabetes Other    History  Substance Use Topics  . Smoking status: Never Smoker   . Smokeless tobacco: Never Used  . Alcohol Use: 8.4 oz/week    14 Cans of beer per week     Comment: 1-2 drinks daily, bindge (>6 at one sitting monthly)   OB History   Grav Para Term Preterm Abortions TAB SAB Ect Mult Living                 Review of Systems  Constitutional: Positive for  chills and appetite change. Negative for fever.  HENT: Negative for congestion.   Eyes: Negative for visual disturbance.  Respiratory: Negative for shortness of breath.   Cardiovascular: Negative for chest pain.  Gastrointestinal: Positive for nausea, vomiting and abdominal pain.  Genitourinary: Positive for flank pain. Negative for dysuria.  Musculoskeletal: Negative for back pain, neck pain and neck stiffness.  Skin: Negative for rash.  Neurological: Positive for light-headedness. Negative for headaches.      Allergies  Amoxicillin and Penicillins  Home Medications   Current Outpatient Rx  Name  Route  Sig  Dispense  Refill  . citalopram (CELEXA) 20 MG tablet   Oral   Take 1 tablet (20 mg total) by mouth daily.   30 tablet   0   . ibuprofen (ADVIL,MOTRIN) 600 MG tablet   Oral   Take 1 tablet (600 mg total) by mouth every 8 (eight) hours as needed for pain.   30 tablet   0   . polyethylene glycol powder (GLYCOLAX/MIRALAX) powder   Oral   Take 17 g by mouth 2 (two) times daily as needed.   3350 g   1    BP 123/72  Pulse 70  Temp(Src) 98.1 F (36.7 C)  Resp 18  Ht 5\' 2"  (1.575 m)  Wt 167 lb (75.751 kg)  BMI 30.54 kg/m2  SpO2 98%  LMP  07/31/2013 Physical Exam  Nursing note and vitals reviewed. Constitutional: She is oriented to person, place, and time. She appears well-developed and well-nourished.  HENT:  Head: Normocephalic and atraumatic.  Eyes: Conjunctivae are normal. Right eye exhibits no discharge. Left eye exhibits no discharge.  Neck: Normal range of motion. Neck supple. No tracheal deviation present.  Cardiovascular: Normal rate and regular rhythm.   Pulmonary/Chest: Effort normal and breath sounds normal.  Abdominal: Soft. She exhibits no distension. There is tenderness (right upper quadrant and right flank). There is no guarding.  Musculoskeletal: She exhibits no edema.  Neurological: She is alert and oriented to person, place, and time.  Skin:  Skin is warm. No rash noted.  Psychiatric: She has a normal mood and affect.    ED Course  Procedures (including critical care time).  EMERGENCY DEPARTMENT BILIARY ULTRASOUND INTERPRETATION "Study: Limited Abdominal Ultrasound of the gallbladder and common bile duct."  INDICATIONS: RUQ pain, Nausea, Vomiting and Back pain Indication: Multiple views of the gallbladder and common bile duct were obtained in real-time with a Multi-frequency probe." PERFORMED BY:  Myself IMAGES ARCHIVED?: Yes FINDINGS: Gallstones absent, Gallbladder wall normal in thickness, Sonographic Murphy's sign present and Common bile duct normal in size LIMITATIONS: Body Habitus and Bowel Gas INTERPRETATION: Normal   Labs Review Labs Reviewed  COMPREHENSIVE METABOLIC PANEL - Abnormal; Notable for the following:    Glucose, Bld 100 (*)    Total Bilirubin 0.2 (*)    All other components within normal limits  URINALYSIS, ROUTINE W REFLEX MICROSCOPIC - Abnormal; Notable for the following:    Leukocytes, UA SMALL (*)    All other components within normal limits  URINE MICROSCOPIC-ADD ON - Abnormal; Notable for the following:    Bacteria, UA FEW (*)    All other components within normal limits  CBC WITH DIFFERENTIAL  LIPASE, BLOOD  PREGNANCY, URINE   Imaging Review No results found.   EKG Interpretation None      MDM   Final diagnoses:  Right upper quadrant abdominal pain  Vomiting    Clinically biliary colic and with recent abnormal hiatus scan and worsening symptoms plan for surgery consult to discuss cholecystectomy. Laboratory unremarkable. Bedside ultrasound did not show signs of cholecystitis or cholelithiasis. Pain medicines, IV fluids and nausea meds.  Discuss case with on-call surgeon to see if possible to remove GB tomorrow, with normal labs, no signs of cholecystitis on my bedside ultrasound recommended followup on Monday to discuss surgery plans.  Results and differential diagnosis were  discussed with the patient. Close follow up outpatient was discussed, patient comfortable with the plan.   Filed Vitals:   08/31/13 1547  BP: 123/72  Pulse: 70  Temp: 98.1 F (36.7 C)  Resp: 18  Height: 5\' 2"  (1.575 m)  Weight: 167 lb (75.751 kg)  SpO2: 98%      Mariea Clonts, MD 09/01/13 0205

## 2013-08-31 NOTE — ED Notes (Signed)
Pt reports right, lower abdominal pain that radiates to the back since 2006. States that she has had 6 ultrasounds with nothing to explain the pain. Pt had a HIDA scan performed this past Monday and was told that she needed to follow up with the Surgeon regarding the findings. Pt reports nausea, weakness with the abdominal pain. Denies dysuria.

## 2013-09-03 ENCOUNTER — Other Ambulatory Visit (INDEPENDENT_AMBULATORY_CARE_PROVIDER_SITE_OTHER): Payer: Self-pay | Admitting: Surgery

## 2013-09-04 ENCOUNTER — Encounter (INDEPENDENT_AMBULATORY_CARE_PROVIDER_SITE_OTHER): Payer: PRIVATE HEALTH INSURANCE | Admitting: Surgery

## 2013-09-05 ENCOUNTER — Encounter (HOSPITAL_COMMUNITY): Payer: Self-pay | Admitting: Pharmacy Technician

## 2013-09-05 ENCOUNTER — Encounter (HOSPITAL_COMMUNITY): Payer: Self-pay | Admitting: *Deleted

## 2013-09-05 MED ORDER — CIPROFLOXACIN IN D5W 400 MG/200ML IV SOLN: 400.0000 mg | INTRAVENOUS | Status: DC

## 2013-09-06 ENCOUNTER — Ambulatory Visit (HOSPITAL_COMMUNITY): Payer: PRIVATE HEALTH INSURANCE | Admitting: Certified Registered"

## 2013-09-06 ENCOUNTER — Encounter (HOSPITAL_COMMUNITY): Payer: Self-pay | Admitting: *Deleted

## 2013-09-06 ENCOUNTER — Ambulatory Visit (HOSPITAL_COMMUNITY)
Admission: RE | Admit: 2013-09-06 | Discharge: 2013-09-06 | Disposition: A | Discharge status: Home / Self Care | Payer: PRIVATE HEALTH INSURANCE | Source: Ambulatory Visit | Attending: Surgery | Admitting: Surgery

## 2013-09-06 ENCOUNTER — Ambulatory Visit (HOSPITAL_COMMUNITY): Payer: PRIVATE HEALTH INSURANCE

## 2013-09-06 ENCOUNTER — Encounter (HOSPITAL_COMMUNITY): Payer: PRIVATE HEALTH INSURANCE | Admitting: Certified Registered"

## 2013-09-06 ENCOUNTER — Encounter (HOSPITAL_COMMUNITY)
Admission: RE | Disposition: A | Discharge status: Home / Self Care | Payer: Self-pay | Source: Ambulatory Visit | Attending: Surgery

## 2013-09-06 DIAGNOSIS — K811 Chronic cholecystitis: Secondary | ICD-10-CM | POA: Insufficient documentation

## 2013-09-06 DIAGNOSIS — K824 Cholesterolosis of gallbladder: Secondary | ICD-10-CM

## 2013-09-06 DIAGNOSIS — F411 Generalized anxiety disorder: Secondary | ICD-10-CM | POA: Insufficient documentation

## 2013-09-06 DIAGNOSIS — F329 Major depressive disorder, single episode, unspecified: Secondary | ICD-10-CM | POA: Insufficient documentation

## 2013-09-06 DIAGNOSIS — R011 Cardiac murmur, unspecified: Secondary | ICD-10-CM | POA: Insufficient documentation

## 2013-09-06 DIAGNOSIS — F3289 Other specified depressive episodes: Secondary | ICD-10-CM | POA: Insufficient documentation

## 2013-09-06 HISTORY — DX: Other reaction to spinal and lumbar puncture: G97.1

## 2013-09-06 HISTORY — DX: Family history of other specified conditions: Z84.89

## 2013-09-06 HISTORY — DX: Other complications of anesthesia, initial encounter: T88.59XA

## 2013-09-06 HISTORY — PX: CHOLECYSTECTOMY: SHX55

## 2013-09-06 HISTORY — DX: Adverse effect of unspecified anesthetic, initial encounter: T41.45XA

## 2013-09-06 SURGERY — LAPAROSCOPIC CHOLECYSTECTOMY
Anesthesia: General | Site: Abdomen

## 2013-09-06 MED ORDER — SODIUM CHLORIDE 0.9 % IR SOLN
Status: DC | PRN
  Administered 2013-09-06: 1000 mL

## 2013-09-06 MED ORDER — ONDANSETRON HCL 4 MG/2ML IJ SOLN
INTRAMUSCULAR | Status: AC
  Filled 2013-09-06: qty 2

## 2013-09-06 MED ORDER — FENTANYL CITRATE 0.05 MG/ML IJ SOLN
INTRAMUSCULAR | Status: AC
  Filled 2013-09-06: qty 5

## 2013-09-06 MED ORDER — ONDANSETRON HCL 4 MG/2ML IJ SOLN
INTRAMUSCULAR | Status: DC | PRN
Start: 2013-09-06 — End: 2013-09-06
  Administered 2013-09-06: 4 mg via INTRAVENOUS

## 2013-09-06 MED ORDER — PROPOFOL 10 MG/ML IV BOLUS
INTRAVENOUS | Status: AC
  Filled 2013-09-06: qty 20

## 2013-09-06 MED ORDER — HYDROCODONE-ACETAMINOPHEN 5-325 MG PO TABS: 1.0000 | ORAL_TABLET | ORAL | Status: DC | PRN

## 2013-09-06 MED ORDER — DEXAMETHASONE SODIUM PHOSPHATE 4 MG/ML IJ SOLN
INTRAMUSCULAR | Status: DC | PRN
  Administered 2013-09-06: 8 mg via INTRAVENOUS

## 2013-09-06 MED ORDER — GLYCOPYRROLATE 0.2 MG/ML IJ SOLN
INTRAMUSCULAR | Status: DC | PRN
  Administered 2013-09-06: 0.4 mg via INTRAVENOUS

## 2013-09-06 MED ORDER — HYDROMORPHONE HCL PF 1 MG/ML IJ SOLN
INTRAMUSCULAR | Status: AC
  Filled 2013-09-06: qty 1

## 2013-09-06 MED ORDER — MIDAZOLAM HCL 2 MG/2ML IJ SOLN
INTRAMUSCULAR | Status: AC
  Filled 2013-09-06: qty 2

## 2013-09-06 MED ORDER — NEOSTIGMINE METHYLSULFATE 1 MG/ML IJ SOLN
INTRAMUSCULAR | Status: AC
  Filled 2013-09-06: qty 10

## 2013-09-06 MED ORDER — LIDOCAINE HCL (CARDIAC) 20 MG/ML IV SOLN
INTRAVENOUS | Status: DC | PRN
  Administered 2013-09-06: 60 mg via INTRAVENOUS

## 2013-09-06 MED ORDER — KETOROLAC TROMETHAMINE 30 MG/ML IJ SOLN
INTRAMUSCULAR | Status: DC | PRN
  Administered 2013-09-06: 30 mg via INTRAVENOUS

## 2013-09-06 MED ORDER — OXYCODONE HCL 5 MG PO TABS: 5.0000 mg | ORAL_TABLET | Freq: Once | ORAL | Status: DC | PRN

## 2013-09-06 MED ORDER — LACTATED RINGERS IV SOLN
INTRAVENOUS | Status: DC
  Administered 2013-09-06 (×2): via INTRAVENOUS

## 2013-09-06 MED ORDER — ROCURONIUM BROMIDE 100 MG/10ML IV SOLN
INTRAVENOUS | Status: DC | PRN
  Administered 2013-09-06: 30 mg via INTRAVENOUS

## 2013-09-06 MED ORDER — NEOSTIGMINE METHYLSULFATE 1 MG/ML IJ SOLN
INTRAMUSCULAR | Status: DC | PRN
  Administered 2013-09-06: 3 mg via INTRAVENOUS

## 2013-09-06 MED ORDER — SODIUM CHLORIDE 0.9 % IV SOLN: INTRAVENOUS | Status: DC

## 2013-09-06 MED ORDER — KETOROLAC TROMETHAMINE 30 MG/ML IJ SOLN
INTRAMUSCULAR | Status: AC
  Filled 2013-09-06: qty 1

## 2013-09-06 MED ORDER — FENTANYL CITRATE 0.05 MG/ML IJ SOLN
INTRAMUSCULAR | Status: DC | PRN
  Administered 2013-09-06: 50 ug via INTRAVENOUS
  Administered 2013-09-06: 100 ug via INTRAVENOUS
  Administered 2013-09-06: 50 ug via INTRAVENOUS

## 2013-09-06 MED ORDER — HYDROMORPHONE HCL PF 1 MG/ML IJ SOLN
0.2500 mg | INTRAMUSCULAR | Status: DC | PRN
  Administered 2013-09-06 (×4): 0.5 mg via INTRAVENOUS

## 2013-09-06 MED ORDER — BUPIVACAINE-EPINEPHRINE (PF) 0.25% -1:200000 IJ SOLN
INTRAMUSCULAR | Status: AC
  Filled 2013-09-06: qty 30

## 2013-09-06 MED ORDER — MORPHINE SULFATE 4 MG/ML IJ SOLN: 4.0000 mg | INTRAMUSCULAR | Status: DC | PRN

## 2013-09-06 MED ORDER — OXYCODONE HCL 5 MG/5ML PO SOLN: 5.0000 mg | Freq: Once | ORAL | Status: DC | PRN

## 2013-09-06 MED ORDER — OXYCODONE HCL 5 MG/5ML PO SOLN
ORAL | Status: AC
  Filled 2013-09-06: qty 10

## 2013-09-06 MED ORDER — BUPIVACAINE-EPINEPHRINE 0.25% -1:200000 IJ SOLN
INTRAMUSCULAR | Status: DC | PRN
  Administered 2013-09-06: 20 mL

## 2013-09-06 MED ORDER — PROPOFOL 10 MG/ML IV BOLUS
INTRAVENOUS | Status: DC | PRN
  Administered 2013-09-06: 180 mg via INTRAVENOUS

## 2013-09-06 MED ORDER — GLYCOPYRROLATE 0.2 MG/ML IJ SOLN
INTRAMUSCULAR | Status: AC
  Filled 2013-09-06: qty 2

## 2013-09-06 MED ORDER — CIPROFLOXACIN IN D5W 400 MG/200ML IV SOLN
INTRAVENOUS | Status: AC
  Administered 2013-09-06: 400 mg via INTRAVENOUS
  Filled 2013-09-06: qty 200

## 2013-09-06 MED ORDER — 0.9 % SODIUM CHLORIDE (POUR BTL) OPTIME
TOPICAL | Status: DC | PRN
  Administered 2013-09-06: 1000 mL

## 2013-09-06 MED ORDER — OXYCODONE HCL 5 MG PO TABS
5.0000 mg | ORAL_TABLET | ORAL | Status: DC | PRN
  Administered 2013-09-06: 10 mg via ORAL

## 2013-09-06 MED ORDER — MIDAZOLAM HCL 5 MG/5ML IJ SOLN
INTRAMUSCULAR | Status: DC | PRN
  Administered 2013-09-06: 2 mg via INTRAVENOUS

## 2013-09-06 MED ORDER — DEXAMETHASONE SODIUM PHOSPHATE 4 MG/ML IJ SOLN
INTRAMUSCULAR | Status: AC
  Filled 2013-09-06: qty 2

## 2013-09-06 MED ORDER — LIDOCAINE HCL (CARDIAC) 20 MG/ML IV SOLN
INTRAVENOUS | Status: AC
Start: 2013-09-06 — End: 2013-09-06
  Filled 2013-09-06: qty 5

## 2013-09-06 MED ORDER — PHENYLEPHRINE 40 MCG/ML (10ML) SYRINGE FOR IV PUSH (FOR BLOOD PRESSURE SUPPORT)
PREFILLED_SYRINGE | INTRAVENOUS | Status: AC
  Filled 2013-09-06: qty 10

## 2013-09-06 MED ORDER — KETOROLAC TROMETHAMINE 30 MG/ML IJ SOLN
15.0000 mg | Freq: Once | INTRAMUSCULAR | Status: AC | PRN
  Administered 2013-09-06: 30 mg via INTRAVENOUS

## 2013-09-06 MED ORDER — ONDANSETRON HCL 4 MG/2ML IJ SOLN: 4.0000 mg | Freq: Once | INTRAMUSCULAR | Status: DC | PRN

## 2013-09-06 SURGICAL SUPPLY — 44 items
APL SKNCLS STERI-STRIP NONHPOA (GAUZE/BANDAGES/DRESSINGS) ×1
APPLIER CLIP 5 13 M/L LIGAMAX5 (MISCELLANEOUS) ×3
APR CLP MED LRG 5 ANG JAW (MISCELLANEOUS) ×1
BAG SPEC RTRVL LRG 6X4 10 (ENDOMECHANICALS)
BANDAGE ADHESIVE 1X3 (GAUZE/BANDAGES/DRESSINGS) ×12 IMPLANT
BENZOIN TINCTURE PRP APPL 2/3 (GAUZE/BANDAGES/DRESSINGS) ×3 IMPLANT
CANISTER SUCTION 2500CC (MISCELLANEOUS) ×3 IMPLANT
CHLORAPREP W/TINT 26ML (MISCELLANEOUS) ×3 IMPLANT
CLIP APPLIE 5 13 M/L LIGAMAX5 (MISCELLANEOUS) ×1 IMPLANT
COVER MAYO STAND STRL (DRAPES) IMPLANT
COVER SURGICAL LIGHT HANDLE (MISCELLANEOUS) ×3 IMPLANT
DECANTER SPIKE VIAL GLASS SM (MISCELLANEOUS) ×3 IMPLANT
DRAPE C-ARM 42X72 X-RAY (DRAPES) IMPLANT
DRAPE UTILITY 15X26 W/TAPE STR (DRAPE) ×6 IMPLANT
ELECT REM PT RETURN 9FT ADLT (ELECTROSURGICAL) ×3
ELECTRODE REM PT RTRN 9FT ADLT (ELECTROSURGICAL) ×1 IMPLANT
GLOVE BIOGEL PI IND STRL 7.0 (GLOVE) IMPLANT
GLOVE BIOGEL PI IND STRL 7.5 (GLOVE) ×1 IMPLANT
GLOVE BIOGEL PI INDICATOR 7.0 (GLOVE) ×4
GLOVE BIOGEL PI INDICATOR 7.5 (GLOVE) ×2
GLOVE ECLIPSE 7.5 STRL STRAW (GLOVE) ×2 IMPLANT
GLOVE SURG SIGNA 7.5 PF LTX (GLOVE) ×3 IMPLANT
GLOVE SURG SS PI 7.0 STRL IVOR (GLOVE) ×4 IMPLANT
GOWN STRL REUS W/ TWL LRG LVL3 (GOWN DISPOSABLE) ×3 IMPLANT
GOWN STRL REUS W/ TWL XL LVL3 (GOWN DISPOSABLE) ×1 IMPLANT
GOWN STRL REUS W/TWL LRG LVL3 (GOWN DISPOSABLE) ×9
GOWN STRL REUS W/TWL XL LVL3 (GOWN DISPOSABLE) ×3
KIT BASIN OR (CUSTOM PROCEDURE TRAY) ×3 IMPLANT
KIT ROOM TURNOVER OR (KITS) ×3 IMPLANT
NS IRRIG 1000ML POUR BTL (IV SOLUTION) ×3 IMPLANT
PAD ARMBOARD 7.5X6 YLW CONV (MISCELLANEOUS) ×3 IMPLANT
POUCH SPECIMEN RETRIEVAL 10MM (ENDOMECHANICALS) IMPLANT
SCISSORS LAP 5X35 DISP (ENDOMECHANICALS) ×3 IMPLANT
SET CHOLANGIOGRAPH 5 50 .035 (SET/KITS/TRAYS/PACK) IMPLANT
SET IRRIG TUBING LAPAROSCOPIC (IRRIGATION / IRRIGATOR) ×3 IMPLANT
SLEEVE ENDOPATH XCEL 5M (ENDOMECHANICALS) ×6 IMPLANT
SPECIMEN JAR SMALL (MISCELLANEOUS) ×3 IMPLANT
SUT MON AB 4-0 PC3 18 (SUTURE) ×3 IMPLANT
TOWEL OR 17X24 6PK STRL BLUE (TOWEL DISPOSABLE) ×3 IMPLANT
TOWEL OR 17X26 10 PK STRL BLUE (TOWEL DISPOSABLE) ×3 IMPLANT
TRAY LAPAROSCOPIC (CUSTOM PROCEDURE TRAY) ×3 IMPLANT
TROCAR XCEL BLUNT TIP 100MML (ENDOMECHANICALS) ×3 IMPLANT
TROCAR XCEL NON-BLD 5MMX100MML (ENDOMECHANICALS) ×3 IMPLANT
WATER STERILE IRR 1000ML POUR (IV SOLUTION) IMPLANT

## 2013-09-06 NOTE — Progress Notes (Signed)
Pt had complaint of brief episode of nausea with movement accompanied by dizziness.  MD Moser notified.  Ordered Phenergan suppository.  When patient was told what the medication ordered was, she stated she felt better and was discharged home with no further episodes.

## 2013-09-06 NOTE — Progress Notes (Signed)
Dr. Ninfa Linden contacted regarding this patient.  Patient is saying that when she drinks, she feels like she is having contractions on the inside, and is refusing to drink more.  Patient also has not voided.  This was all relayed to Dr. Ninfa Linden.  Orders received to discharge the patient to home, with family.  If patient needs, she may return to the ER in the night if she is unable to void her bladder, OR to go to the office in the morning if unable to void her bladder.  THis information was repeated back to the MD x2 to check for accuracy.  This was explained to the patient and her partner.  Questions answered.  Patient discharged to home, with family

## 2013-09-06 NOTE — Anesthesia Procedure Notes (Signed)
Procedure Name: Intubation Date/Time: 09/06/2013 1:41 PM Performed by: Julian Reil Pre-anesthesia Checklist: Patient identified, Emergency Drugs available, Suction available and Patient being monitored Patient Re-evaluated:Patient Re-evaluated prior to inductionOxygen Delivery Method: Circle system utilized Preoxygenation: Pre-oxygenation with 100% oxygen Intubation Type: IV induction Ventilation: Mask ventilation without difficulty Laryngoscope Size: Mac and 4 Grade View: Grade II Tube type: Oral Tube size: 7.5 mm Number of attempts: 1 Airway Equipment and Method: Stylet Placement Confirmation: ETT inserted through vocal cords under direct vision,  positive ETCO2 and breath sounds checked- equal and bilateral Secured at: 22 cm Tube secured with: Tape Dental Injury: Teeth and Oropharynx as per pre-operative assessment

## 2013-09-06 NOTE — Progress Notes (Signed)
Care of pt assumed by MA Laken Rog RN 

## 2013-09-06 NOTE — Transfer of Care (Signed)
Immediate Anesthesia Transfer of Care Note  Patient: [name]  Procedure(s) Performed: Procedure(s): LAPAROSCOPIC CHOLECYSTECTOMY (N/A)  Patient Location: PACU  Anesthesia Type:General  Level of Consciousness: awake, alert , oriented and patient cooperative  Airway & Oxygen Therapy: Patient Spontanous Breathing and Patient connected to face mask oxygen  Post-op Assessment: Report given to PACU RN, Post -op Vital signs reviewed and stable and Patient moving all extremities  Post vital signs: Reviewed and stable  Complications: No apparent anesthesia complications

## 2013-09-06 NOTE — H&P (Signed)
Chief Complaint   Patient presents with   .  Abdominal Pain     RUQ abdominal pain   HPI  [name] is a 33 y.o. female.  HPI  This is a pleasant female referred by the of residency teaching service, Dr. Jene Every for evaluation of right fourth abdominal pain. She has actually had this intermittently for many years. She has had at least 6 separate ultrasounds of her gallbladder which all have been unremarkable with no evidence of gallstones or gallbladder wall thickening. She has never had any further workup ordered including a HIDA scan. She has right upper quadrant abdominal pain after fatty meals with occasional nausea but no emesis. The pain is described as cramping with spasm. He does not referring your else. Bowel movements are normal. She is otherwise without complaints  Past Medical History   Diagnosis  Date   .  Hx gestational diabetes    .  Diabetes mellitus without complication    .  Allergy    .  Anxiety    .  Depression    .  Heart murmur     Past Surgical History   Procedure  Laterality  Date   .  Nasal sinus surgery   2003   .  Tubal ligation     .  Cesarean section      Family History   Problem  Relation  Age of Onset   .  Hypertension  Mother    .  Hyperlipidemia  Mother    .  Diabetes  Other    Social History  History   Substance Use Topics   .  Smoking status:  Never Smoker   .  Smokeless tobacco:  Never Used   .  Alcohol Use:  8.4 oz/week     14 Cans of beer per week      Comment: 1-2 drinks daily, bindge (>6 at one sitting monthly)    Allergies   Allergen  Reactions   .  Amoxicillin  Hives and Swelling   .  Penicillins     Current Outpatient Prescriptions   Medication  Sig  Dispense  Refill   .  citalopram (CELEXA) 20 MG tablet  Take 1 tablet (20 mg total) by mouth daily.  30 tablet  0   .  doxycycline (ADOXA) 100 MG tablet  Take 1 tablet (100 mg total) by mouth 2 (two) times daily.  28 tablet  0   .  ibuprofen (ADVIL,MOTRIN) 600 MG tablet  Take 1  tablet (600 mg total) by mouth every 8 (eight) hours as needed for pain.  30 tablet  0   .  ondansetron (ZOFRAN ODT) 4 MG disintegrating tablet  Take 1 tablet (4 mg total) by mouth every 8 (eight) hours as needed for nausea or vomiting.  20 tablet  0   .  oxyCODONE-acetaminophen (ROXICET) 5-325 MG per tablet  Take 1 tablet by mouth every 8 (eight) hours as needed for severe pain.  30 tablet  0   .  polyethylene glycol powder (GLYCOLAX/MIRALAX) powder  Take 17 g by mouth 2 (two) times daily as needed.  3350 g  1    No current facility-administered medications for this visit.   Review of Systems  Review of Systems  Constitutional: Negative for fever, chills and unexpected weight change.  HENT: Negative for congestion, hearing loss, sore throat, trouble swallowing and voice change.  Eyes: Negative for visual disturbance.  Respiratory: Negative for cough  and wheezing.  Cardiovascular: Negative for chest pain, palpitations and leg swelling.  Gastrointestinal: Positive for nausea and abdominal pain. Negative for vomiting, diarrhea, constipation, blood in stool, abdominal distention and anal bleeding.  Genitourinary: Negative for hematuria, vaginal bleeding and difficulty urinating.  Musculoskeletal: Negative for arthralgias.  Skin: Negative for rash and wound.  Neurological: Negative for seizures, syncope and headaches.  Hematological: Negative for adenopathy. Does not bruise/bleed easily.  Psychiatric/Behavioral: Negative for confusion.  Blood pressure 122/81, pulse 72, temperature 98.6 F (37 C), temperature source Temporal, resp. rate 18, height 5\' 2"  (1.575 m), weight 168 lb (76.204 kg).  Physical Exam  Physical Exam  Constitutional: She is oriented to person, place, and time. She appears well-developed and well-nourished. No distress.  HENT:  Head: Normocephalic and atraumatic.  Right Ear: External ear normal.  Left Ear: External ear normal.  Nose: Nose normal.  Mouth/Throat: Oropharynx  is clear and moist. No oropharyngeal exudate.  Eyes: Conjunctivae are normal. Pupils are equal, round, and reactive to light. Right eye exhibits no discharge. Left eye exhibits no discharge. No scleral icterus.  Neck: Normal range of motion. Neck supple. No tracheal deviation present.  Cardiovascular: Normal rate, regular rhythm, normal heart sounds and intact distal pulses.  No murmur heard.  Pulmonary/Chest: Effort normal and breath sounds normal. No respiratory distress. She has no wheezes. She has no rales.  Abdominal: Soft. Bowel sounds are normal. She exhibits no distension. There is no tenderness. There is no rebound.  Musculoskeletal: Normal range of motion. She exhibits no edema.  Lymphadenopathy:  She has no cervical adenopathy.  Neurological: She is alert and oriented to person, place, and time.  Skin: Skin is warm and dry. No rash noted. She is not diaphoretic. No erythema.  Psychiatric: Her behavior is normal. Judgment normal.  Data Reviewed  Again, I have reviewed all her ultrasounds showing a normal gallbladder with no gallstones, no gallbladder wall thickening, and a normal bile duct. 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  Lap chole is recommended.  The risks were discussed.  These include but are not limited to bleeding, infection, injury to surrounding structures, bile leak, the need to convert to an open procedure, the chance this may not resolve her symptoms, etc.  She agrees to proceed.

## 2013-09-06 NOTE — Op Note (Signed)
Laparoscopic Cholecystectomy Procedure Note  Indications: This patient presents with symptomatic gallbladder disease and will undergo laparoscopic cholecystectomy.  Pre-operative Diagnosis: biliary dyskinesia  Post-operative Diagnosis: Same  Surgeon: Harl Bowie   Assistants: 0  Anesthesia: General endotracheal anesthesia  ASA Class: 1  Procedure Details  The patient was seen again in the Holding Room. The risks, benefits, complications, treatment options, and expected outcomes were discussed with the patient. The possibilities of reaction to medication, pulmonary aspiration, perforation of viscus, bleeding, recurrent infection, finding a normal gallbladder, the need for additional procedures, failure to diagnose a condition, the possible need to convert to an open procedure, and creating a complication requiring transfusion or operation were discussed with the patient. The likelihood of improving the patient's symptoms with return to their baseline status is good.  The patient and/or family concurred with the proposed plan, giving informed consent. The site of surgery properly noted. The patient was taken to Operating Room, identified as [name] and the procedure verified as Laparoscopic Cholecystectomy with Intraoperative Cholangiogram. A Time Out was held and the above information confirmed.  Prior to the induction of general anesthesia, antibiotic prophylaxis was administered. General endotracheal anesthesia was then administered and tolerated well. After the induction, the abdomen was prepped with Chloraprep and draped in sterile fashion. The patient was positioned in the supine position.  Local anesthetic agent was injected into the skin near the umbilicus and an incision made. We dissected down to the abdominal fascia with blunt dissection.  The fascia was incised vertically and we entered the peritoneal cavity bluntly.  A pursestring suture of 0-Vicryl was placed around the  fascial opening.  The Hasson cannula was inserted and secured with the stay suture.  Pneumoperitoneum was then created with CO2 and tolerated well without any adverse changes in the patient's vital signs. An 11-mm port was placed in the subxiphoid position.  Two 5-mm ports were placed in the right upper quadrant. All skin incisions were infiltrated with a local anesthetic agent before making the incision and placing the trocars.   We positioned the patient in reverse Trendelenburg, tilted slightly to the patient's left.  The gallbladder was identified, the fundus grasped and retracted cephalad. Adhesions were lysed bluntly and with the electrocautery where indicated, taking care not to injure any adjacent organs or viscus. The infundibulum was grasped and retracted laterally, exposing the peritoneum overlying the triangle of Calot. This was then divided and exposed in a blunt fashion. The cystic duct was clearly identified and bluntly dissected circumferentially. A critical view of the cystic duct and cystic artery was obtained.  The cystic duct was then ligated with clips and divided. The cystic artery was, dissected free, ligated with clips and divided as well.   The gallbladder was dissected from the liver bed in retrograde fashion with the electrocautery. The gallbladder was removed and placed in an Endocatch sac. The liver bed was irrigated and inspected. Hemostasis was achieved with the electrocautery. Copious irrigation was utilized and was repeatedly aspirated until clear.  The gallbladder and Endocatch sac were then removed through the umbilical port site.  The pursestring suture was used to close the umbilical fascia.    We again inspected the right upper quadrant for hemostasis.  Pneumoperitoneum was released as we removed the trocars.  4-0 Monocryl was used to close the skin.   Benzoin, steri-strips, and clean dressings were applied. The patient was then extubated and brought to the recovery room  in stable condition. Instrument, sponge, and  needle counts were correct at closure and at the conclusion of the case.   Findings: Cholecystitis without Cholelithiasis  Estimated Blood Loss: Minimal         Drains: 0         Specimens: Gallbladder           Complications: None; patient tolerated the procedure well.         Disposition: PACU - hemodynamically stable.         Condition: stable

## 2013-09-06 NOTE — Anesthesia Preprocedure Evaluation (Signed)
Anesthesia Evaluation  Patient identified by MRN, date of birth, ID band Patient awake    Reviewed: Allergy & Precautions, H&P , NPO status , Patient's Chart, lab work & pertinent test results  Airway Mallampati: II TM Distance: >3 FB Neck ROM: Full    Dental  (+) Teeth Intact   Pulmonary  breath sounds clear to auscultation        Cardiovascular Rhythm:Regular Rate:Normal     Neuro/Psych    GI/Hepatic   Endo/Other    Renal/GU      Musculoskeletal   Abdominal   Peds  Hematology   Anesthesia Other Findings   Reproductive/Obstetrics                           Anesthesia Physical Anesthesia Plan  ASA: II  Anesthesia Plan: General   Post-op Pain Management:    Induction: Intravenous  Airway Management Planned: Oral ETT  Additional Equipment:   Intra-op Plan:   Post-operative Plan: Extubation in OR  Informed Consent: I have reviewed the patients History and Physical, chart, labs and discussed the procedure including the risks, benefits and alternatives for the proposed anesthesia with the patient or authorized representative who has indicated his/her understanding and acceptance.     Plan Discussed with: CRNA and Anesthesiologist  Anesthesia Plan Comments:         Anesthesia Quick Evaluation

## 2013-09-06 NOTE — Anesthesia Postprocedure Evaluation (Signed)
Anesthesia Post Note  Patient: [name]  Procedure(s) Performed: Procedure(s) (LRB): LAPAROSCOPIC CHOLECYSTECTOMY (N/A)  Anesthesia type: General  Patient location: PACU  Post pain: Pain level controlled and Adequate analgesia  Post assessment: Post-op Vital signs reviewed, Patient's Cardiovascular Status Stable, Respiratory Function Stable, Patent Airway and Pain level controlled  Last Vitals:  Filed Vitals:   09/06/13 1445  BP:   Pulse: 75  Temp:   Resp: 15    Post vital signs: Reviewed and stable  Level of consciousness: awake, alert  and oriented  Complications: No apparent anesthesia complications

## 2013-09-06 NOTE — Discharge Instructions (Signed)
[facility] ______[facility] LAPAROSCOPIC SURGERY: POST OP INSTRUCTIONS Always review your discharge instruction sheet given to you by the facility where your surgery was performed. IF YOU HAVE DISABILITY OR FAMILY LEAVE FORMS, YOU MUST BRING THEM TO THE OFFICE FOR PROCESSING.   DO NOT GIVE THEM TO YOUR DOCTOR.  1. A prescription for pain medication may be given to you upon discharge.  Take your pain medication as prescribed, if needed.  If narcotic pain medicine is not needed, then you may take acetaminophen (Tylenol) or ibuprofen (Advil) as needed. 2. Take your usually prescribed medications unless otherwise directed. 3. If you need a refill on your pain medication, please contact your pharmacy.  They will contact our office to request authorization. Prescriptions will not be filled after 5pm or on week-ends. 4. You should follow a light diet the first few days after arrival home, such as soup and crackers, etc.  Be sure to include lots of fluids daily. 5. Most patients will experience some swelling and bruising in the area of the incisions.  Ice packs will help.  Swelling and bruising can take several days to resolve.  6. It is common to experience some constipation if taking pain medication after surgery.  Increasing fluid intake and taking a stool softener (such as Colace) will usually help or prevent this problem from occurring.  A mild laxative (Milk of Magnesia or Miralax) should be taken according to package instructions if there are no bowel movements after 48 hours. 7. Unless discharge instructions indicate otherwise, you may remove your bandages 24-48 hours after surgery, and you may shower at that time.  You may have steri-strips (small skin tapes) in place directly over the incision.  These strips should be left on the skin for 7-10 days.  If your surgeon used skin glue on the incision, you may shower in 24 hours.  The glue will flake off over the next 2-3 weeks.  Any sutures or  staples will be removed at the office during your follow-up visit. 8. ACTIVITIES:  You may resume regular (light) daily activities beginning the next day--such as daily self-care, walking, climbing stairs--gradually increasing activities as tolerated.  You may have sexual intercourse when it is comfortable.  Refrain from any heavy lifting or straining until approved by your doctor. a. You may drive when you are no longer taking prescription pain medication, you can comfortably wear a seatbelt, and you can safely maneuver your car and apply brakes. b. RETURN TO WORK:  ____2 WEEKS______________________________________________________ 9. You should see your doctor in the office for a follow-up appointment approximately 2-3 weeks after your surgery.  Make sure that you call for this appointment within a day or two after you arrive home to insure a convenient appointment time. 10. OTHER INSTRUCTIONS: __NO LIFTING OVER 15 POUNDS FOR 2 WEEKS.________________________________________________________________________________________________________________________ __________________________________________________________________________________________________________________________ WHEN TO CALL YOUR DOCTOR: 1. Fever over 101.0 2. Inability to urinate 3. Continued bleeding from incision. 4. Increased pain, redness, or drainage from the incision. 5. Increasing abdominal pain     To Whom it May Concern:  [name] is recovering from surgery performed on 09/06/13.  She will need to be out of work for 2 weeks to recover.  She may return to work on Sep 21, 2013. Thank you.  Coralie Keens, MD    The clinic staff is available to answer your questions during regular business hours.  Please dont hesitate to call and ask to speak to one of the nurses for clinical concerns.  If you have a medical emergency, go to the nearest emergency room or call 911.  A surgeon from [facility] is always on  call at the hospital. [address], [place], [place], [place]  [address] ? [address], [place], [place]   [phone] ? [phone] ? FAX [phone] Web site: [email or web address]   What to eat:  For your first meals, you should eat lightly; only small meals initially.  If you do not have nausea, you may eat larger meals.  Avoid spicy, greasy and heavy food.    General Anesthesia, Adult, Care After  Refer to this sheet in the next few weeks. These instructions provide you with information on caring for yourself after your procedure. Your health care provider may also give you more specific instructions. Your treatment has been planned according to current medical practices, but problems sometimes occur. Call your health care provider if you have any problems or questions after your procedure.  WHAT TO EXPECT AFTER THE PROCEDURE  After the procedure, it is typical to experience:  Sleepiness.  Nausea and vomiting. HOME CARE INSTRUCTIONS  For the first 24 hours after general anesthesia:  Have a responsible person with you.  Do not drive a car. If you are alone, do not take public transportation.  Do not drink alcohol.  Do not take medicine that has not been prescribed by your health care provider.  Do not sign important papers or make important decisions.  You may resume a normal diet and activities as directed by your health care provider.  Change bandages (dressings) as directed.  If you have questions or problems that seem related to general anesthesia, call the hospital and ask for the anesthetist or anesthesiologist on call. SEEK MEDICAL CARE IF:  You have nausea and vomiting that continue the day after anesthesia.  You develop a rash. SEEK IMMEDIATE MEDICAL CARE IF:  You have difficulty breathing.  You have chest pain.  You have any allergic problems. Document Released: 08/16/2000 Document Revised: 01/10/2013 Document Reviewed: 11/23/2012   [facility] Patient Information 2014 [place], [place].

## 2013-09-10 ENCOUNTER — Encounter (HOSPITAL_COMMUNITY): Payer: Self-pay | Admitting: Surgery

## 2013-09-12 ENCOUNTER — Telehealth (INDEPENDENT_AMBULATORY_CARE_PROVIDER_SITE_OTHER): Payer: Self-pay

## 2013-09-12 NOTE — Telephone Encounter (Signed)
Patient calling into office to report that she's having nausea, no vomiting or fevers.  Patient denies having any abdominal pains.  Patient s/p lap chole 09/06/13.  Patient has post op appointment scheduled for 09/28/13.  Patient advised a message will be forwarded to Dr. Ninfa Linden.

## 2013-09-14 ENCOUNTER — Ambulatory Visit (INDEPENDENT_AMBULATORY_CARE_PROVIDER_SITE_OTHER): Payer: PRIVATE HEALTH INSURANCE | Admitting: Family Medicine

## 2013-09-14 ENCOUNTER — Encounter: Payer: Self-pay | Admitting: Family Medicine

## 2013-09-14 VITALS — BP 114/70 | HR 81 | Temp 98.8°F | Ht 62.0 in | Wt 161.0 lb

## 2013-09-14 DIAGNOSIS — L299 Pruritus, unspecified: Secondary | ICD-10-CM

## 2013-09-14 DIAGNOSIS — N390 Urinary tract infection, site not specified: Secondary | ICD-10-CM

## 2013-09-14 DIAGNOSIS — K5900 Constipation, unspecified: Secondary | ICD-10-CM

## 2013-09-14 DIAGNOSIS — R300 Dysuria: Secondary | ICD-10-CM

## 2013-09-14 LAB — POCT URINALYSIS DIPSTICK
Bilirubin, UA: NEGATIVE
Glucose, UA: NEGATIVE
Ketones, UA: NEGATIVE
NITRITE UA: NEGATIVE
PH UA: 6
PROTEIN UA: NEGATIVE
RBC UA: NEGATIVE
Spec Grav, UA: <=1.005
Urobilinogen, UA: 0.2

## 2013-09-14 LAB — POCT URINE PREGNANCY: Preg Test, Ur: NEGATIVE

## 2013-09-14 LAB — POCT UA - MICROSCOPIC ONLY

## 2013-09-14 MED ORDER — PHENAZOPYRIDINE HCL 100 MG PO TABS: 100.0000 mg | ORAL_TABLET | Freq: Three times a day (TID) | ORAL | Status: DC | PRN

## 2013-09-14 MED ORDER — CIPROFLOXACIN HCL 500 MG PO TABS: 500.0000 mg | ORAL_TABLET | Freq: Two times a day (BID) | ORAL | Status: DC

## 2013-09-14 NOTE — Patient Instructions (Signed)
It was nice to meet you today!  For your urine: - we are treating you for a UTI with cipro 500mg  twice a day for 2 weeks - I sent in pyridium which you can take up to 3 times a day as needed for pain  STOP the gas medicine Get over the counter miralax to help you with bowel movements.  This weekend if you have worsening pain, a fever, aren't able to eat/drink, or cannot urinate at all, go to the emergency room. If you aren't feeling better by Monday return to clinic to be seen.  Be well, Dr. Ardelia Mems   Urinary Tract Infection Urinary tract infections (UTIs) can develop anywhere along your urinary tract. Your urinary tract is your body's drainage system for removing wastes and extra water. Your urinary tract includes two kidneys, two ureters, a bladder, and a urethra. Your kidneys are a pair of bean-shaped organs. Each kidney is about the size of your fist. They are located below your ribs, one on each side of your spine. CAUSES Infections are caused by microbes, which are microscopic organisms, including fungi, viruses, and bacteria. These organisms are so small that they can only be seen through a microscope. Bacteria are the microbes that most commonly cause UTIs. SYMPTOMS  Symptoms of UTIs may vary by age and gender of the patient and by the location of the infection. Symptoms in young women typically include a frequent and intense urge to urinate and a painful, burning feeling in the bladder or urethra during urination. Older women and men are more likely to be tired, shaky, and weak and have muscle aches and abdominal pain. A fever may mean the infection is in your kidneys. Other symptoms of a kidney infection include pain in your back or sides below the ribs, nausea, and vomiting. DIAGNOSIS To diagnose a UTI, your caregiver will ask you about your symptoms. Your caregiver also will ask to provide a urine sample. The urine sample will be tested for bacteria and white blood cells. White  blood cells are made by your body to help fight infection. TREATMENT  Typically, UTIs can be treated with medication. Because most UTIs are caused by a bacterial infection, they usually can be treated with the use of antibiotics. The choice of antibiotic and length of treatment depend on your symptoms and the type of bacteria causing your infection. HOME CARE INSTRUCTIONS  If you were prescribed antibiotics, take them exactly as your caregiver instructs you. Finish the medication even if you feel better after you have only taken some of the medication.  Drink enough water and fluids to keep your urine clear or pale yellow.  Avoid caffeine, tea, and carbonated beverages. They tend to irritate your bladder.  Empty your bladder often. Avoid holding urine for long periods of time.  Empty your bladder before and after sexual intercourse.  After a bowel movement, women should cleanse from front to back. Use each tissue only once. SEEK MEDICAL CARE IF:   You have back pain.  You develop a fever.  Your symptoms do not begin to resolve within 3 days. SEEK IMMEDIATE MEDICAL CARE IF:   You have severe back pain or lower abdominal pain.  You develop chills.  You have nausea or vomiting.  You have continued burning or discomfort with urination. MAKE SURE YOU:   Understand these instructions.  Will watch your condition.  Will get help right away if you are not doing well or get worse. Document Released: 02/17/2005 Document  Revised: 11/09/2011 Document Reviewed: 06/18/2011 [facility] Patient Information 2014 [place], [place].

## 2013-09-14 NOTE — Progress Notes (Signed)
Patient ID: [name], female   DOB: [birth year], 33 y.o.   MRN: [id number]  HPI:  Pt presents for a same day appointment to discuss dysuria and suprapubic pain.  Pt had lap chole done 8 days ago. For the last 3 days she has felt pressure in her pelvic area and has had dysuria. She has had hesitancy as well, feeling like she needs to urinate but can't. Despite that feeling, she is able to void in general. Has had R sided back pain for 3 days as well. No fevers. No vaginal discharge.  Had some post op abd pain and called surgeon, who recommended OTC simethicone gas relief, which pt has taken. For the last 3 days since starting that medicine she has had itching of her hands and feet, also had hives the other night. No difficulty breathing or swelling of her face. Has taken PO benadryl at home. She stopped the gas medicine today. Thinks she's been constipated for the last few days as well, and feels her abdomen is bloated/distended. Has not vomited. Keeping fluids down well. Last took percocet 3 days ago  ROS: See HPI.  Foley: hx lap chole 8 days ago, PCN allergy  PHYSICAL EXAM: BP 114/70  Pulse 81  Temp(Src) 98.8 F (37.1 C) (Oral)  Ht 5\' 2"  (1.575 m)  Wt 161 lb (73.029 kg)  BMI 29.44 kg/m2  LMP 09/02/2013 Gen: NAD HEENT: NCAT, EOMI, MMM, no oral lesions Heart: RRR, no murmurs Lungs: CTAB, NWOB Abdomen: normoactive BS, soft, no masses or organomegaly. Mild suprapubic tenderness. No RLQ or LLQ tenderness. No rebound tenderness or guarding. Steristips still place overlying surgical incisions. No skin breakdown, erythema, or drainage of incisions Neuro: grossly nonfocal, speech intact Back: R CVA tenderness present Derm: no rashes noted  ASSESSMENT/PLAN:  # Dysuria & suprapubic pain: most likely secondary to UTI. UA with 5-10 whites and 2+ bacteria. Urine pregnancy negative today (and pt has hx of BTL). Possible upper urinary tract infection given CVA tenderness and subjective hx of back  pain. Pt well appearing today with normal vital signs, afebrile. - send urine for culture - treat with cipro 500mg  BID x 14 days (PCN allergic) - pyridium 100mg  TID prn pain - discussed return precautions with patient, including reasons she should go to the ER this weekend and reasons to return to clinic on Monday  # Itching: question whether this is reaction to simethicone. No rashes visible today on hands or skin. - d/c simethicone - prn benadryl, f/u if worsening  # Constipation: abd exam benign - recommend OTC miralax  FOLLOW UP: F/u as needed if symptoms worsen or do not improve.   [place]. Ardelia Mems, [place]

## 2013-09-16 LAB — URINE CULTURE: Colony Count: 4000

## 2013-09-28 ENCOUNTER — Encounter (INDEPENDENT_AMBULATORY_CARE_PROVIDER_SITE_OTHER): Payer: Self-pay | Admitting: General Surgery

## 2013-09-28 ENCOUNTER — Encounter (INDEPENDENT_AMBULATORY_CARE_PROVIDER_SITE_OTHER): Payer: Self-pay | Admitting: Surgery

## 2013-09-28 ENCOUNTER — Ambulatory Visit (INDEPENDENT_AMBULATORY_CARE_PROVIDER_SITE_OTHER): Payer: PRIVATE HEALTH INSURANCE | Admitting: Surgery

## 2013-09-28 VITALS — BP 122/74 | HR 75 | Temp 98.5°F | Ht 62.0 in | Wt 160.0 lb

## 2013-09-28 DIAGNOSIS — Z09 Encounter for follow-up examination after completed treatment for conditions other than malignant neoplasm: Secondary | ICD-10-CM

## 2013-09-28 NOTE — Progress Notes (Signed)
Subjective:     Patient ID: [name], female   DOB: 12/30/1980, 33 y.o.   MRN: [id number]  HPI  She is here for her first postoperative visit status post laparoscopic cholecystectomy. She is doing moderately well. She still has occasional constipation and occasional loose stools. She is just now starting to try fatty foods Review of Systems     Objective:   Physical Exam  on exam, she looks well. Her abdomen is soft and her incisions are well-healed. The final pathology showed chronic cholecystitis with no gallstones    Assessment:     Patient stable postop     Plan:     She'll return to normal activities and work without limits starting next week on May 13. We will see her back as needed

## 2013-11-29 ENCOUNTER — Emergency Department (INDEPENDENT_AMBULATORY_CARE_PROVIDER_SITE_OTHER)
Admission: EM | Admit: 2013-11-29 | Discharge: 2013-11-29 | Disposition: A | Discharge status: Home / Self Care | Payer: PRIVATE HEALTH INSURANCE | Source: Home / Self Care | Attending: Family Medicine | Admitting: Family Medicine

## 2013-11-29 ENCOUNTER — Encounter (HOSPITAL_COMMUNITY): Payer: Self-pay | Admitting: Emergency Medicine

## 2013-11-29 ENCOUNTER — Emergency Department (INDEPENDENT_AMBULATORY_CARE_PROVIDER_SITE_OTHER): Payer: PRIVATE HEALTH INSURANCE

## 2013-11-29 DIAGNOSIS — J069 Acute upper respiratory infection, unspecified: Secondary | ICD-10-CM

## 2013-11-29 LAB — POCT RAPID STREP A: STREPTOCOCCUS, GROUP A SCREEN (DIRECT): NEGATIVE

## 2013-11-29 MED ORDER — IBUPROFEN 800 MG PO TABS
ORAL_TABLET | ORAL | Status: AC
  Filled 2013-11-29: qty 1

## 2013-11-29 MED ORDER — IBUPROFEN 800 MG PO TABS
800.0000 mg | ORAL_TABLET | Freq: Once | ORAL | Status: AC
  Administered 2013-11-29: 800 mg via ORAL

## 2013-11-29 NOTE — ED Notes (Signed)
Pt reports for the past two days having swelling of neck.  Hard to swallow.  Chest pain.   Fever of 101 for one day relieved with tylenol but work the next morning with neck stiffness.   Epigastric soreness.    Pt states that she is also being monitored for possible thyroid problems.

## 2013-11-29 NOTE — Discharge Instructions (Signed)
Your chest xray was normal. Your test for strep throat was negative and your EKG was also normal. It is likely that your symptoms are a result of a viral upper respiratory illness and will improve over the next 5-7 days. You do not have pneumonia, however, you may develop cough over next few days. Drink plenty of clear fluids, use tylenol or ibuprofen as directed on packaging for fever and aches and pains and rest as you need to. If symptoms become suddenly worse or severe, return to your nearest Emergency Room for re-evaluation.  For your sore throat, please use warm salt water gargles over the next 3-4 days. Infeccin de las vas areas superiores en los adultos (Upper Respiratory Infection, Adult)  La infeccin respiratoria de las vas areas superiores se conoce tambin como resfro comn. Las vas areas superiores [employer] senos nasales, la garganta, la trquea, y los bronquios. Los bronquios son las vas areas que conducen el aire a los pulmones. La mayor parte de las personas mejora luego de una [place], Armed forces training and education officer los sntomas pueden durar [employer]. La tos residual puede durar ms. CAUSAS Varios tipos de virus pueden causar la infeccin de los tejidos que cubren las vas areas superiores. Los tejidos se irritan y se inflaman y se originan secreciones. Tambin es frecuente la produccin de moco. El resfro es contagioso. El virus se disemina fcilmente a otras personas por contacto oral. Aqu se incluyen los besos, el compartir un vaso y el toser o Brewing technologist. Tambin puede diseminarse tocndose la boca o la [place] y luego tocando una superficie que luego tocan Producer, television/film/video.  SNTOMAS Los sntomas se desarrollan entre uno y tres das luego de Dietitian en contacto con el virus. Pueden variar de Ardelia Mems persona a otra. Incluyen:  Secrecin nasal.  Estornudos  Congestin nasal.  Irritacin de los senos nasales.  Dolor de Investment banker, operational.  Prdida de la voz  (laringitis).  Tos.  Fatiga.  Dolores musculares.  Prdida del apetito.  Dolor de [place].  Fiebre no muy elevada. DIAGNSTICO Puede diagnosticarse a s mismo la infeccin respiratoria, segn los sntomas habituales, ya que la mayor parte de las personas se resfra dos o tres veces al ao. El profesional puede confirmarlo basndose en el examen fsico. Lo ms importante es que el profesional verifique que los sntomas no se deben a otra enfermedad como anginas, sinusitis, neumona, asma o epiglotitis. Para diagnosticar el resfrio comn, no es necesario que haga anlisis de [place], pruebas en la garganta o radiografas, pero en algunos casos puede ser de utilidad para excluir otros problemas ms graves. El mdico decidir si necesita otras pruebas. RIESGOS Y COMPLICACIONES Tendr mayor riesgo de sufrir un resfro grave si consume cigarrillos, sufre una enfermedad cardaca (como insuficiencia cardaca) o pulmonar crnica (como asma) o si tiene un debilitamiento del sistema inmunolgico. Las personas muy jvenes o muy mayores tienen riesgo de sufrir infecciones ms graves. La sinusitis bacteriana, las infecciones del odo medio y la neumona bacteriana pueden complicar el resfro comn. El resfro puede exacerbar el asma y la enfermedad pulmonar obstructiva crnica. En algunos casos estas complicaciones requieren la atencin en un servicio de emergencias y pueden poner en peligro la vida. PREVENCIN La mejor manera de protegerse para no contraer un resfro es Theatre manager una buena higiene. Evite el contacto bucal o de las manos con personas con sntomas de resfro. Si se produce el contacto, lvese las manos con frecuencia. No hay pruebas firmes que indiquen que la vitamina C, la vitamina E,  la equincea o la actividad fsica reduzcan las posibilidades de tener una infeccin. Sin embargo, siempre se recomienda Scientific laboratory technician y Lucilla Edin buena nutricin. TRATAMIENTO El tratamiento est dirigido a  Herbalist sntomas. Esta enfermedad no tiene [place]. Los antibiticos no son eficaces, ya que esta infeccin la causa un virus y no una bacteria. El tratamiento incluye:  Aumente la ingesta de lquidos. Consumo de bebidas deportivas, que proporcionan electrolitos,azcares e hidratacin.  Inhale vapor caliente (de un vaporizador o de la ducha).  Tomar sopa de pollo u otros lquidos claros, y [employer] buena nutricin.  Descanse lo suficiente.  Haga grgaras o coma pastillas para [employer].  Control de la fiebre con ibuprofeno o acetaminofen, segn las indicaciones del mdico.  Aumento del uso del inhalador, si sufre asma. Las pastillas y los geles de zinc durante las primeras 24 horas de iniciado el resfro comn, pueden disminuir la duracin y Public house manager la gravedad de los sntomas. Los medicamentos para Conservation officer, historic buildings pueden disminuir la fiebre, Public house manager los dolores musculares y Conservation officer, historic buildings de Investment banker, operational. Se dispone de una gran variedad de medicamentos de venta libre para tratar la congestin y la secrecin nasal. El profesional podr recomendarle inhalantes para los otros sntomas. INSTRUCCIONES PARA EL CUIDADO DOMICILIARIO  Utilice los medicamentos de venta libre o de prescripcin para Conservation officer, historic buildings, el malestar o la [place], segn se lo indique el profesional que lo asiste.  Utilice un vaporizador caliente o inhale vapor, haciendo salir agua de la ducha para aumentar la humedad [place]. Esto mantendr las secreciones hmedas y Arts development officer ms fcil respirar.  Beba gran cantidad de lquido para mantener la orina de tono claro o color amarillo plido.  Descanse todo lo que pueda.  Regrese a su trabajo cuando la temperatura se haya normalizado, o cuando el profesional que lo asiste se lo indique. Quizs sea necesario que permanezca en su casa durante un tiempo ms prolongado para Industrial/product designer a Producer, television/film/video. Tambin puede utilizar un barbijo y ser cuidadoso con el lavado de manos para  evitar la diseminacin del virus. SOLICITE ATENCIN MDICA SI:  Luego de los primeros das siente que empeora en vez de [place].  Necesita que Software engineer brinde ms informacin relacionada con los medicamentos para [employer] sntomas.  Siente escalofros, le falta el aire o escupe moco de color marrn o rojo. Estos pueden ser sntomas de neumona.  Tiene una secrecin nasal de color amarillo o marrn, o siente dolor en el rostro, especialmente cuando se inclina hacia adelante. Estos pueden ser sntomas de sinusitis.  Tiene fiebre, siente el cuello hinchado, tiene dolor al tragar u observa manchas blancas en el fondo de la garganta. Estos pueden ser sntomas de angina por estreptococo. Dana Allan ATENCIN MDICA DE INMEDIATO SI:  Jaclynn Guarneri.  Comienza a sentir Clinical cytogeneticist de cabeza intenso o persistente, dolor de odos, en el seno nasal o en el pecho.  Tiene tos y esta se prolonga demasiado, tose y escupe sangre, la mucosidad habitual se modifica (si tiene una enfermedad pulmonar crnica) o respira con dificultad.  Siente rigidez en el cuello o dolor de cabeza intenso. Document Released: 02/17/2005 Document Revised: 08/02/2011 [facility] Patient Information 2015 [place]. This information is not intended to replace advice given to you by your health care provider. Make sure you discuss any questions you have with your health care provider.

## 2013-11-29 NOTE — ED Provider Notes (Signed)
Medical screening examination/treatment/procedure(s) were performed by resident physician or non-physician practitioner and as supervising physician I was immediately available for consultation/collaboration.   Pauline Good MD.   Billy Fischer, MD 11/29/13 2042

## 2013-11-29 NOTE — ED Provider Notes (Signed)
CSN: [id number]     Arrival date & time 11/29/13  1708 History   First MD Initiated Contact with Patient 11/29/13 1809     Chief Complaint  Patient presents with  . Sore Throat  . Torticollis  . Chest Pain   (Consider location/radiation/quality/duration/timing/severity/associated sxs/prior Treatment) HPI Comments: Patient reports 2 days of upper anterior chest discomfort with associated headache, sore throat, fever, change in voice, and mild shortness of breath. Denies cough. States daughter ill with URI about 5 days ago.  PCP: [facility] No GI sx or rash.   Patient is a 33 y.o. female presenting with pharyngitis and chest pain. The history is provided by the patient.  Sore Throat This is a new problem. Associated symptoms include chest pain, headaches and shortness of breath.  Chest Pain Associated symptoms: fever, headache and shortness of breath   Associated symptoms: no cough, no dizziness, no dysphagia and no weakness     Past Medical History  Diagnosis Date  . Hx gestational diabetes   . Allergy   . Anxiety   . Depression   . Heart murmur   . Complication of anesthesia     Problem waking up once and a headache  . Spinal headache   . Family history of anesthesia complication     Mother had problem waking up   Past Surgical History  Procedure Laterality Date  . Nasal sinus surgery  2003  . Tubal ligation    . Cesarean section    . Cholecystectomy N/A 09/06/2013    Procedure: LAPAROSCOPIC CHOLECYSTECTOMY;  Surgeon: Harl Bowie, MD;  Location: [facility] OR;  Service: General;  Laterality: N/A;   Family History  Problem Relation Age of Onset  . Hypertension Mother   . Hyperlipidemia Mother   . Diabetes Other    History  Substance Use Topics  . Smoking status: Never Smoker   . Smokeless tobacco: Never Used  . Alcohol Use: No     Comment: hasn't had a drink since 07/07/2013   OB History   Grav Para Term Preterm Abortions TAB SAB Ect Mult Living                  Review of Systems  Constitutional: Positive for fever.  HENT: Positive for sore throat and voice change. Negative for congestion, ear discharge, ear pain, hearing loss, mouth sores, nosebleeds, postnasal drip, rhinorrhea, sinus pressure, sneezing, tinnitus and trouble swallowing.   Eyes: Negative.   Respiratory: Positive for chest tightness and shortness of breath. Negative for cough, wheezing and stridor.   Cardiovascular: Positive for chest pain.  Gastrointestinal: Negative.   Endocrine: Negative for polydipsia, polyphagia and polyuria.  Genitourinary: Negative.   Musculoskeletal: Negative.   Skin: Negative.   Neurological: Positive for headaches. Negative for dizziness, seizures, syncope, speech difficulty, weakness and light-headedness.    Allergies  Amoxicillin and Penicillins  Home Medications   Prior to Admission medications   Medication Sig Start Date End Date Taking? Authorizing Provider  ondansetron (ZOFRAN-ODT) 4 MG disintegrating tablet Take 4 mg by mouth every 4 (four) hours as needed for nausea or vomiting.    Historical Provider, MD   LMP 11/22/2013 Physical Exam  Nursing note and vitals reviewed. Constitutional: She is oriented to person, place, and time. She appears well-developed and well-nourished. No distress.  HENT:  Head: Normocephalic and atraumatic.  Right Ear: Hearing, tympanic membrane, external ear and ear canal normal.  Left Ear: Hearing, tympanic membrane, external ear and ear canal normal.  Nose:  Nose normal.  Mouth/Throat: Uvula is midline and mucous membranes are normal. No oral lesions. No trismus in the jaw. No uvula swelling. Posterior oropharyngeal erythema present. No oropharyngeal exudate, posterior oropharyngeal edema or tonsillar abscesses.  Eyes: Conjunctivae are normal. Right eye exhibits no discharge. Left eye exhibits no discharge. No scleral icterus.  Neck: Normal range of motion. Neck supple. No tracheal deviation present. No  thyromegaly present.  +shotty slightly tender bilateral anterior cervical lymphadenopathy  Cardiovascular: Normal rate, regular rhythm and normal heart sounds.   Pulmonary/Chest: Effort normal and breath sounds normal. No stridor. No respiratory distress. She has no wheezes.  Abdominal: Soft. Bowel sounds are normal. She exhibits no distension. There is no tenderness.  Musculoskeletal: Normal range of motion. She exhibits no edema and no tenderness.  Lymphadenopathy:    She has cervical adenopathy.  Neurological: She is alert and oriented to person, place, and time.  Skin: Skin is warm and dry. No rash noted. No erythema.  Psychiatric: She has a normal mood and affect. Her behavior is normal.    ED Course  Procedures (including critical care time) Labs Review Labs Reviewed  CULTURE, GROUP A STREP  POCT RAPID STREP A ([place])    Imaging Review Dg Chest 2 View  11/29/2013   CLINICAL DATA:  Chest pain for 2 days.  EXAM: CHEST  2 VIEW  COMPARISON:  09/06/2013.  FINDINGS: The heart size and mediastinal contours are within normal limits. Both lungs are clear. The visualized skeletal structures are unremarkable.  IMPRESSION: No active cardiopulmonary disease.   Electronically Signed   By: Lajean Manes M.D.   On: 11/29/2013 18:52     MDM  No diagnosis found. Symptoms like attributable to a viral URI. Rapid strep negative. EKG NSR at 67 bpm with normal intervals and no acute ST/Twave changes or ectopy. CXR normal.  Your chest xray was normal. Your test for strep throat was negative and your EKG was also normal. It is likely that your symptoms are a result of a viral upper respiratory illness and will improve over the next 5-7 days. You do not have pneumonia, however, you may develop cough over next few days. Drink plenty of clear fluids, use tylenol or ibuprofen as directed on packaging for fever and aches and pains and rest as you need to. If symptoms become suddenly worse or severe,  return to your nearest Emergency Room for re-evaluation.  For your sore throat, please use warm salt water gargles over the next 3-4 days.    [place], [place] 11/29/13 [phone]

## 2013-12-01 LAB — CULTURE, GROUP A STREP

## 2014-04-02 ENCOUNTER — Ambulatory Visit (INDEPENDENT_AMBULATORY_CARE_PROVIDER_SITE_OTHER): Payer: PRIVATE HEALTH INSURANCE | Admitting: Family Medicine

## 2014-04-02 ENCOUNTER — Encounter: Payer: Self-pay | Admitting: Family Medicine

## 2014-04-02 ENCOUNTER — Ambulatory Visit (INDEPENDENT_AMBULATORY_CARE_PROVIDER_SITE_OTHER): Payer: PRIVATE HEALTH INSURANCE | Admitting: *Deleted

## 2014-04-02 VITALS — BP 116/67 | HR 80 | Temp 98.3°F | Ht 62.0 in | Wt 164.4 lb

## 2014-04-02 DIAGNOSIS — R358 Other polyuria: Secondary | ICD-10-CM | POA: Insufficient documentation

## 2014-04-02 DIAGNOSIS — R7303 Prediabetes: Secondary | ICD-10-CM

## 2014-04-02 DIAGNOSIS — Z23 Encounter for immunization: Secondary | ICD-10-CM

## 2014-04-02 DIAGNOSIS — R3589 Other polyuria: Secondary | ICD-10-CM | POA: Insufficient documentation

## 2014-04-02 DIAGNOSIS — Z0000 Encounter for general adult medical examination without abnormal findings: Secondary | ICD-10-CM | POA: Insufficient documentation

## 2014-04-02 DIAGNOSIS — E0789 Other specified disorders of thyroid: Secondary | ICD-10-CM

## 2014-04-02 DIAGNOSIS — E119 Type 2 diabetes mellitus without complications: Secondary | ICD-10-CM

## 2014-04-02 DIAGNOSIS — N644 Mastodynia: Secondary | ICD-10-CM | POA: Insufficient documentation

## 2014-04-02 DIAGNOSIS — R631 Polydipsia: Secondary | ICD-10-CM | POA: Insufficient documentation

## 2014-04-02 HISTORY — DX: Other specified disorders of thyroid: E07.89

## 2014-04-02 HISTORY — DX: Type 2 diabetes mellitus without complications: E11.9

## 2014-04-02 HISTORY — DX: Prediabetes: R73.03

## 2014-04-02 LAB — POCT UA - MICROSCOPIC ONLY
Epithelial cells, urine per micros: >20
WBC, Ur, HPF, POC: >20

## 2014-04-02 LAB — POCT URINALYSIS DIPSTICK
Bilirubin, UA: NEGATIVE
Blood, UA: NEGATIVE
GLUCOSE UA: NEGATIVE
Ketones, UA: NEGATIVE
NITRITE UA: NEGATIVE
PH UA: 6
Protein, UA: NEGATIVE
Spec Grav, UA: <=1.005
Urobilinogen, UA: 0.2

## 2014-04-02 LAB — POCT GLYCOSYLATED HEMOGLOBIN (HGB A1C): HEMOGLOBIN A1C: 6

## 2014-04-02 LAB — POCT URINE PREGNANCY: PREG TEST UR: NEGATIVE

## 2014-04-02 MED ORDER — CEPHALEXIN 500 MG PO CAPS: 500.0000 mg | ORAL_CAPSULE | Freq: Four times a day (QID) | ORAL | Status: DC

## 2014-04-02 MED ORDER — METFORMIN HCL 500 MG PO TABS
500.0000 mg | ORAL_TABLET | Freq: Two times a day (BID) | ORAL | Status: DC
Start: 2014-04-02 — End: 2017-01-26

## 2014-04-02 NOTE — Assessment & Plan Note (Addendum)
Urine pregnancy>>>negative TSH>>>pending We will call pt with results.

## 2014-04-02 NOTE — Assessment & Plan Note (Signed)
Patient with left thyroid fullness today. TSH, T3, T4 and thyroid ultrasound ordered. No nodules palpated.

## 2014-04-02 NOTE — Assessment & Plan Note (Signed)
Flu vaccination given today Tetanus vaccination given today Pap smears up-to-date

## 2014-04-02 NOTE — Assessment & Plan Note (Addendum)
A1c>>>6.0 Started metformin 500 mg BID F/u 1 month

## 2014-04-02 NOTE — Patient Instructions (Signed)
I will call you with all of your laboratory results once they have resulted. Was a pleasure seeing you again today.

## 2014-04-02 NOTE — Assessment & Plan Note (Signed)
Started metformin 500  Mg BID today A1c repeated in 3 months Pt encourage to perform 150 minutes of exercise a week Encouraged her to start watching her diet, eating less carbohydrates and sugars.  F/u 1 month

## 2014-04-02 NOTE — Progress Notes (Signed)
   Subjective:    Patient ID: [name], female    DOB: 1981/03/17, 33 y.o.   MRN: [id number]  HPI  Patient presents to family medicine clinic today for routine visit, annual physical, with multiple complaints.  Polyuria/polydipsia: Patient states that she has noticed that she has increased thirst she is uncertain when this started, but it's every day. She occasionally experiences dizziness/headaches. 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 Patient states she had gestational diabetes with both of her pregnancies. There is a family history of diabetes. Patient had her ophthalmologist appointment last week, without any concerns noted. Patient denies any current dizziness today.  Breast tenderness: Patient states her last menstrual period was 03/23/2014. She states it was an unusual period for her, and was light all 5 days. Since she has occurred morning nausea and breast tenderness. Patient had tubal ligation for birth control. She has had unprotected sex.  Thyroid: Patient reports a fullness in her neck approximately at the level of her thyroid. She states it feels like there is a mild pressure located there, she has no difficulty with swallowing. In addition she has noticed dry skin, hair loss, constipation, fatigue and headaches.she has a history of pituitary adenoma, that has been resected, an MRI a few months ago was normal.  Health maintenance: Patient is due for flu and tetanus shot today. She is up-to-date on Pap smear as of 2014, with HPV negative, normal Pap.  Nonsmoker  Past Medical History  Diagnosis Date  . Hx gestational diabetes   . Allergy   . Anxiety   . Depression   . Heart murmur   . Complication of anesthesia     Problem waking up once and a headache  . Spinal headache   . Family history of anesthesia complication     Mother had problem waking up   Allergies  Allergen Reactions   . Amoxicillin Hives and Swelling  . Penicillins Hives and Swelling    Review of Systems Positive for Dry skin, hair loss, fatigue, headaches, blurry vision, constipation, weight gain, nauseated, breast tenderness    Objective:   Physical Exam BP 116/67 mmHg  Pulse 80  Temp(Src) 98.3 F (36.8 C) (Oral)  Ht 5\' 2"  (1.575 m)  Wt 164 lb 6.4 oz (74.571 kg)  BMI 30.06 kg/m2 Gen: pleasant, anxious, [place] female, no acute distress, nontoxic in appearance, well-developed, well-nourished. HEENT: AT. Ages. Bilateral TM visualized and normal in appearance. Bilateral eyes without injections or icterus. MMM. Bilateral nares with mild erythema and mild swelling. Throat without erythema or exudates.  CV: RRR no murmurs Chest: CTAB, no wheeze or crackles Abd: Soft. round.ND. Moderate suprapubic pain. BS present. no Masses palpated.  Ext: No erythema. No edema. plus 2/4 PT/DP Skin: norashes, purpura or petechiae.  Neuro: Normal gait. PERLA. EOMi. Alert. cranial nerves II through XII intact  MSK: 5/5 upper and lower extremity muscle strength bilaterally. No CVA tenderness. Psych:anxious, talkative. Mildly pressured speech.    Assessment & Plan:

## 2014-04-02 NOTE — Assessment & Plan Note (Addendum)
UA>>> + leuks, neg nitrite, suprapubicpain with frequency>> treat with keflex (pt is amox/pcn allergic but has taken keflex without problems in the past) A1c>>> 6.0 >> started medications ( metformin)

## 2014-04-03 ENCOUNTER — Telehealth: Payer: Self-pay | Admitting: Family Medicine

## 2014-04-03 LAB — TSH: TSH: 1.036 u[IU]/mL (ref 0.350–4.500)

## 2014-04-03 LAB — T3, FREE: T3, Free: 3 pg/mL (ref 2.3–4.2)

## 2014-04-03 LAB — T4, FREE: Free T4: 0.85 ng/dL (ref 0.80–1.80)

## 2014-04-03 NOTE — Telephone Encounter (Signed)
Please call the patient and inform her that her thyroid studies are normal. We will continue with the ultrasound scheduled and I will call her once those results become available. Thank you

## 2014-04-03 NOTE — Telephone Encounter (Signed)
Spoke with patient and informed her of below 

## 2014-04-05 LAB — URINE CULTURE

## 2014-04-11 ENCOUNTER — Ambulatory Visit (HOSPITAL_COMMUNITY)
Admission: RE | Admit: 2014-04-11 | Discharge: 2014-04-11 | Disposition: A | Discharge status: Home / Self Care | Payer: PRIVATE HEALTH INSURANCE | Source: Ambulatory Visit | Attending: Family Medicine | Admitting: Family Medicine

## 2014-04-11 ENCOUNTER — Telehealth: Payer: Self-pay | Admitting: Family Medicine

## 2014-04-11 DIAGNOSIS — E041 Nontoxic single thyroid nodule: Secondary | ICD-10-CM | POA: Insufficient documentation

## 2014-04-11 DIAGNOSIS — E0789 Other specified disorders of thyroid: Secondary | ICD-10-CM

## 2014-04-11 NOTE — Telephone Encounter (Signed)
I have placed a referral to ENT for pt to follow up her thyroid US. There was no large nodules. However, there appearred to be a small cystic structure by description and possible swollen lymph nodes in that area. She also feels she has had a voice change since her [place]. I have placed the referral today for ENT. Thanks.

## 2014-04-16 ENCOUNTER — Telehealth: Payer: Self-pay | Admitting: Family Medicine

## 2014-04-16 NOTE — Telephone Encounter (Signed)
Pt called and would like the results of her ultra sound . jw

## 2014-04-16 NOTE — Telephone Encounter (Signed)
Expand All Collapse All   I have placed a referral to ENT for pt to follow up her thyroid US. There was no large nodules. However, there appearred to be a small cystic structure by description and possible swollen lymph nodes in that area. She also feels she has had a voice change since her [place]. I have placed the referral today for ENT. Thanks.     The above was placed in a phone note to [facility] 11/19. Uncertain if they spoke with patient. Please call her and give her information. Thanks.

## 2014-04-22 NOTE — Telephone Encounter (Signed)
Pt calls again today.  She would like a call back about referral today. [place], [name]

## 2014-04-23 NOTE — Telephone Encounter (Signed)
Spoke with patient and informed her of appointment I have set up for her. [facility] @ 1:20pm, patient to arrive at 1:00pm. [address] phone number for them is [phone]

## 2014-07-03 ENCOUNTER — Ambulatory Visit (INDEPENDENT_AMBULATORY_CARE_PROVIDER_SITE_OTHER): Payer: BLUE CROSS/BLUE SHIELD | Admitting: Family Medicine

## 2014-07-03 ENCOUNTER — Encounter: Payer: Self-pay | Admitting: Family Medicine

## 2014-07-03 VITALS — BP 130/78 | HR 72 | Temp 98.4°F | Ht 62.0 in | Wt 162.4 lb

## 2014-07-03 DIAGNOSIS — N920 Excessive and frequent menstruation with regular cycle: Secondary | ICD-10-CM

## 2014-07-03 DIAGNOSIS — H5710 Ocular pain, unspecified eye: Secondary | ICD-10-CM | POA: Insufficient documentation

## 2014-07-03 DIAGNOSIS — H5712 Ocular pain, left eye: Secondary | ICD-10-CM

## 2014-07-03 DIAGNOSIS — E041 Nontoxic single thyroid nodule: Secondary | ICD-10-CM

## 2014-07-03 HISTORY — DX: Excessive and frequent menstruation with regular cycle: N92.0

## 2014-07-03 HISTORY — DX: Nontoxic single thyroid nodule: E04.1

## 2014-07-03 NOTE — Progress Notes (Signed)
   Subjective:    Patient ID: [name], female    DOB: 14-Jun-1980, 34 y.o.   MRN: [id number]  HPI 34 year old female presents to the clinic today with several complaints/issues:  1) Thyroid cysts  Patient reports that she is concerned about her thyroid cysts.  Patient was seen in November 2015 with complaints of fullness in her neck.  Thyroid studies were obtained and were normal.  Thyroid US was obtained and revealed bilateral cystic nodules less than 1 cm.  She subsequently was seen by ENT after experiencing voice change/hoarseness.  Work up by ENT was unremarkable.  She presents today with continued complaints of neck fullness, prior voice change (which was worse with menses).  She is concerned and would like to discuss treatment options.  2) Heavy & Painful menses  Patient reports recent painful and heavy menstrual cycle.  Pain was severe and debilitating.  Bleeding was reportedly heavier than her typical cycle.   3) Eye pain - Left  Patient reports recent left eye pain (yesterday).  No associated eye redness/irritation.  No drainage.  She reports that the pain occurred w/ eye motion.  She also reported associated blurry vision.  This has now resolved.  PMH reviewed.   Review of Systems Per HPI    Objective:   Physical Exam Filed Vitals:   07/03/14 1546  BP: 130/78  Pulse: 72  Temp: 98.4 F (36.9 C)   Exam: General: anxious female in NAD.  HEENT: PERRLA. EOM intact. No eye redness/conjunctival injection noted. Neck: No thyromegaly. No thyroid nodules or lymphadenopathy noted. Cardiovascular: RRR. No murmurs, rubs, or gallops. Respiratory: CTAB. No rales, rhonchi, or wheeze. Abdomen: soft, nontender, nondistended.     Assessment & Plan:  See Problem List

## 2014-07-03 NOTE — Assessment & Plan Note (Signed)
With associated menorrhagia. Advised empiric treatment with OCP's and close follow up. Patient reluctant regarding birth control.  She would like to see how things go before starting treatment.

## 2014-07-03 NOTE — Assessment & Plan Note (Signed)
Now resolved with normal exam today. Advised follow up and need for referral to ophthalmology if this returns.

## 2014-07-03 NOTE — Assessment & Plan Note (Addendum)
Patient very concerned about this. [place] and ENT noted reviewed. Cystic nodules are small (<0.5 cm) and do not meet criteria for biopsy/sampling. No indication/need for treatment given small size. Unclear of patient's reported "swelling" of the neck and hoarseness/loss of voice.  Patient needs to follow up closely with PCP and ENT.

## 2014-07-03 NOTE — Patient Instructions (Addendum)
It was nice to see you today.  Regarding your thyroid cyst, it is very small and I would not be concerned at this time.  If you continue to have difficulty please follow-up with your primary physician.  Regarding her heavy menses, I have seen in a prescription for birth control.  If eye discomfort returns please let us know.  Dr. Lacinda Axon

## 2014-07-29 ENCOUNTER — Encounter (HOSPITAL_COMMUNITY): Payer: Self-pay | Admitting: Emergency Medicine

## 2014-07-29 ENCOUNTER — Emergency Department (HOSPITAL_COMMUNITY)
Admission: EM | Admit: 2014-07-29 | Discharge: 2014-07-29 | Disposition: A | Discharge status: Home / Self Care | Payer: BLUE CROSS/BLUE SHIELD | Source: Home / Self Care | Attending: Emergency Medicine | Admitting: Emergency Medicine

## 2014-07-29 DIAGNOSIS — J019 Acute sinusitis, unspecified: Secondary | ICD-10-CM

## 2014-07-29 DIAGNOSIS — J069 Acute upper respiratory infection, unspecified: Secondary | ICD-10-CM

## 2014-07-29 LAB — POCT RAPID STREP A: STREPTOCOCCUS, GROUP A SCREEN (DIRECT): NEGATIVE

## 2014-07-29 MED ORDER — TRAMADOL HCL 50 MG PO TABS: 100.0000 mg | ORAL_TABLET | Freq: Three times a day (TID) | ORAL | Status: DC | PRN

## 2014-07-29 MED ORDER — FLUTICASONE PROPIONATE 50 MCG/ACT NA SUSP: 2.0000 | Freq: Every day | NASAL | Status: DC

## 2014-07-29 MED ORDER — LEVOFLOXACIN 500 MG PO TABS: 500.0000 mg | ORAL_TABLET | Freq: Every day | ORAL | Status: DC

## 2014-07-29 NOTE — ED Notes (Signed)
Patient c/o sinus pain and pressure x 4 days. Patient reports she has taken DayQuil and Motrin with no real relief. Patient reports she felt better then sx recurred after she went outside to play with her kids. Patient is in NAD.

## 2014-07-29 NOTE — ED Provider Notes (Signed)
Chief Complaint   Sinusitis   History of Present Illness   [name] is a 34 year old female who has had a four-day history of nasal congestion with green drainage, sinus pressure, headache, pressure behind her eyes, right ear pain, swelling in her neck, upper back pain, chills, dry cough, and chest pressure. She denies fever wheezing or GI symptoms.  Review of Systems   Other than as noted above, the patient denies any of the following symptoms: Systemic:  No fevers, chills, sweats, or myalgias. Eye:  No redness or discharge. ENT:  No ear pain, headache, nasal congestion, drainage, sinus pressure, or sore throat. Neck:  No neck pain, stiffness, or swollen glands. Lungs:  No cough, sputum production, hemoptysis, wheezing, chest tightness, shortness of breath or chest pain. GI:  No abdominal pain, nausea, vomiting or diarrhea.  [place]   Past medical history, family history, social history, meds, and allergies were reviewed.   Physical exam   Vital signs:  BP 138/90 mmHg  Pulse 73  Temp(Src) 98.4 F (36.9 C) (Oral)  Resp 20  SpO2 100%  LMP 07/18/2014 (Exact Date) General:  Alert and oriented.  In no distress.  Skin warm and dry. Eye:  No conjunctival injection or drainage. Lids were normal. ENT:  TMs and canals were normal, without erythema or inflammation.  Nasal mucosa was clear and uncongested, without drainage.  Mucous membranes were moist.  Pharynx was clear with no exudate or drainage.  There were no oral ulcerations or lesions. Neck:  Supple, no adenopathy, tenderness or mass. Lungs:  No respiratory distress.  Lungs were clear to auscultation, without wheezes, rales or rhonchi.  Breath sounds were clear and equal bilaterally.  Heart:  Regular rhythm, without gallops, murmers or rubs. Skin:  Clear, warm, and dry, without rash or lesions.  Labs   Results for orders placed or performed during the hospital encounter of 07/29/14  POCT rapid strep A [facility])    Result Value Ref Range   Streptococcus, Group A Screen (Direct) NEGATIVE NEGATIVE    Assessment     The primary encounter diagnosis was Viral URI. A diagnosis of Acute sinusitis, recurrence not specified, unspecified location was also pertinent to this visit.  There is really not much to see on her exam today, but the patient states she feels terrible. She's only been sick for about 4 days, but in this case I am inclined to treat sinusitis due to severe symptom presentation.  Plan    1.  Meds:  The following meds were prescribed:   Discharge Medication List as of 07/29/2014  9:04 PM    START taking these medications   Details  fluticasone (FLONASE) 50 MCG/ACT nasal spray Place 2 sprays into both nostrils daily., Starting 07/29/2014, Until Discontinued, Normal    levofloxacin (LEVAQUIN) 500 MG tablet Take 1 tablet (500 mg total) by mouth daily., Starting 07/29/2014, Until Discontinued, Normal    traMADol (ULTRAM) 50 MG tablet Take 2 tablets (100 mg total) by mouth every 8 (eight) hours as needed., Starting 07/29/2014, Until Discontinued, Print        2.  Patient Education/Counseling:  The patient was given appropriate handouts, self care instructions, and instructed in symptomatic relief.  Instructed to get extra fluids and extra rest.    3.  Follow up:  The patient was told to follow up here if no better in 3 to 4 days, or sooner if becoming worse in any way, and given some red flag symptoms  such as increasing fever, difficulty breathing, chest pain, or persistent vomiting which would prompt immediate return.       Harden Mo, MD 07/29/14 [phone]

## 2014-07-29 NOTE — Discharge Instructions (Signed)
Sinusitis (Sinusitis) La sinusitis es el enrojecimiento, el dolor y la inflamacin de los senos paranasales. Los senos paranasales son bolsas de aire que se encuentran dentro de los huesos de la cara (por debajo de los ojos, en la mitad de la frente o por encima de los ojos). En los senos paranasales sanos, el moco puede drenar y el aire circula a travs de ellos en su camino hacia la Lawyer. Sin embargo, cuando se [place], el moco y el aire [place]. Esto hace que se desarrollen bacterias y otros grmenes que causan infeccin. La sinusitis puede desarrollarse rpidamente y durar solo un tiempo corto (aguda) o continuar por un perodo largo (crnica). La sinusitis que dura ms de 12 semanas se considera crnica.  CAUSAS  Las causas de la sinusitis son:  Cualquier alergia que tenga.  Las [employer], como el desplazamiento del cartlago que separa las fosas nasales (desvo del tabique), que pueden disminuir el flujo de aire por la nariz y los senos paranasales, y Print production planner su drenaje.  Las anomalas funcionales, como cuando los pequeos pelos (cilias) que se encuentran en los senos paranasales y que ayudan a eliminar el moco no funcionan correctamente o no estn presentes. [place] sntomas de la sinusitis aguda y [place] son los mismos. Los sntomas principales son Conservation officer, historic buildings y la presin alrededor de los senos paranasales afectados. Otros sntomas son:  Chief of Staff.  Dolor de odos.  Dolor de [place].  Mal aliento.  Disminucin del sentido del olfato y del gusto.  Tos, que empeora al [employer].  Fatiga.  Cristy Hilts.  Drenaje de moco espeso por la nariz, que generalmente es de color verde y puede contener pus (purulento).  Hinchazn y calor en los senos paranasales afectados. DIAGNSTICO  Su mdico le Chartered certified accountant examen fsico. Durante el examen, el mdico:  Le revisar la nariz para buscar signos de crecimientos anormales en las  fosas nasales (plipos nasales).  Palpar los senos paranasales afectados para buscar signos de infeccin.  Ver el interior de los senos paranasales (endoscopia) a travs de un dispositivo de obtencin de imgenes que tiene una luz conectada (endoscopio). Si el mdico sospecha que usted sufre sinusitis crnica, podr indicar una o ms de las siguientes pruebas:  Pruebas de Buyer, retail.  Cultivo de las [employer]. Se extrae Truddie Coco de moco de la nariz, que se enva al laboratorio para detectar bacterias.  Citologa nasal. Se extrae Truddie Coco de moco de la nariz, que el mdico examina para determinar si la sinusitis est relacionada con Obie Dredge. TRATAMIENTO  La mayora de los casos de sinusitis aguda se deben a una infeccin viral y se resuelven espontneamente en un perodo de 10das. En algunos casos, se recetan medicamentos para [employer] (analgsicos, descongestivos, aerosoles nasales con corticoides o aerosoles salinos).  Sin embargo, para la sinusitis por infeccin bacteriana, Camera operator. Los antibiticos son medicamentos que destruyen las bacterias que causan la infeccin.  Con poca frecuencia, la sinusitis tiene su origen en una infeccin por hongos. En estos casos, el mdico recetar un medicamento antimictico. Para algunos casos de sinusitis crnica, es necesario someterse a [place]. Generalmente, se trata de [employer] la sinusitis se repite ms de 3veces al ao, a pesar de otros tratamientos. INSTRUCCIONES PARA EL CUIDADO EN EL HOGAR   Beber gran cantidad de lquidos. Los lquidos ayudan a [employer], para que drene ms fcilmente de los senos paranasales.  Use un humidificador.  Inhale vapor de 3 a 4 veces al da (por ejemplo, sintese en el bao con la ducha abierta).  Aplquese un pao tibio y hmedo en la cara 3 o 4 veces [place] indicaciones del mdico.  Use un aerosol nasal salino para ayudar  a Air cabin crew y [employer] senos nasales.  Tome los medicamentos solamente como se lo haya indicado el mdico.  Si le recetaron un antimictico o un antibitico, asegrese de terminarlos, incluso si comienza a sentirse mejor. SOLICITE ATENCIN MDICA DE INMEDIATO SI:  Siente ms dolor o sufre dolores de cabeza intensos.  Tiene nuseas, vmitos o somnolencia.  Observa hinchazn alrededor del rostro.  Tiene problemas de visin.  Presenta rigidez en el cuello.  Tiene dificultad para respirar. ASEGRESE DE QUE:   Comprende estas instrucciones.  Controlar su afeccin.  Recibir ayuda de inmediato si no mejora o si empeora. Document Released: 02/17/2005 Document Revised: 09/24/2013 [facility] Patient Information 2015 [place]. This information is not intended to replace advice given to you by your health care provider. Make sure you discuss any questions you have with your health care provider.

## 2014-08-01 LAB — CULTURE, GROUP A STREP: Strep A Culture: NEGATIVE

## 2014-08-08 ENCOUNTER — Other Ambulatory Visit: Payer: Self-pay | Admitting: Family Medicine

## 2014-08-19 ENCOUNTER — Ambulatory Visit (INDEPENDENT_AMBULATORY_CARE_PROVIDER_SITE_OTHER): Payer: BLUE CROSS/BLUE SHIELD | Admitting: Family Medicine

## 2014-08-19 ENCOUNTER — Encounter: Payer: Self-pay | Admitting: Family Medicine

## 2014-08-19 VITALS — BP 118/59 | HR 74 | Temp 97.9°F | Ht 62.0 in | Wt 161.3 lb

## 2014-08-19 DIAGNOSIS — F4521 Hypochondriasis: Secondary | ICD-10-CM

## 2014-08-19 DIAGNOSIS — E119 Type 2 diabetes mellitus without complications: Secondary | ICD-10-CM | POA: Diagnosis not present

## 2014-08-19 DIAGNOSIS — E0789 Other specified disorders of thyroid: Secondary | ICD-10-CM

## 2014-08-19 DIAGNOSIS — K760 Fatty (change of) liver, not elsewhere classified: Secondary | ICD-10-CM | POA: Diagnosis not present

## 2014-08-19 DIAGNOSIS — N920 Excessive and frequent menstruation with regular cycle: Secondary | ICD-10-CM

## 2014-08-19 HISTORY — DX: Hypochondriasis: F45.21

## 2014-08-19 LAB — CBC WITH DIFFERENTIAL/PLATELET
BASOS PCT: 1 % (ref 0–1)
Basophils Absolute: 0.1 10*3/uL (ref 0.0–0.1)
EOS ABS: 0.4 10*3/uL (ref 0.0–0.7)
Eosinophils Relative: 4 % (ref 0–5)
HEMATOCRIT: 38.1 % (ref 36.0–46.0)
Hemoglobin: 13 g/dL (ref 12.0–15.0)
Lymphocytes Relative: 29 % (ref 12–46)
Lymphs Abs: 2.6 10*3/uL (ref 0.7–4.0)
MCH: 31 pg (ref 26.0–34.0)
MCHC: 34.1 g/dL (ref 30.0–36.0)
MCV: 90.7 fL (ref 78.0–100.0)
MPV: 10.2 fL (ref 8.6–12.4)
Monocytes Absolute: 0.6 10*3/uL (ref 0.1–1.0)
Monocytes Relative: 7 % (ref 3–12)
Neutro Abs: 5.2 10*3/uL (ref 1.7–7.7)
Neutrophils Relative %: 59 % (ref 43–77)
PLATELETS: 312 10*3/uL (ref 150–400)
RBC: 4.2 MIL/uL (ref 3.87–5.11)
RDW: 13.4 % (ref 11.5–15.5)
WBC: 8.8 10*3/uL (ref 4.0–10.5)

## 2014-08-19 LAB — COMPREHENSIVE METABOLIC PANEL
ALBUMIN: 3.9 g/dL (ref 3.5–5.2)
ALT: 16 U/L (ref 0–35)
AST: 12 U/L (ref 0–37)
Alkaline Phosphatase: 83 U/L (ref 39–117)
BILIRUBIN TOTAL: 0.2 mg/dL (ref 0.2–1.2)
BUN: 12 mg/dL (ref 6–23)
CO2: 23 meq/L (ref 19–32)
CREATININE: 0.63 mg/dL (ref 0.50–1.10)
Calcium: 9.1 mg/dL (ref 8.4–10.5)
Chloride: 106 mEq/L (ref 96–112)
Glucose, Bld: 97 mg/dL (ref 70–99)
Potassium: 4.7 mEq/L (ref 3.5–5.3)
Sodium: 137 mEq/L (ref 135–145)
TOTAL PROTEIN: 6.9 g/dL (ref 6.0–8.3)

## 2014-08-19 LAB — T3, FREE: T3, Free: 3.1 pg/mL (ref 2.3–4.2)

## 2014-08-19 LAB — T4, FREE: Free T4: 0.88 ng/dL (ref 0.80–1.80)

## 2014-08-19 LAB — TSH: TSH: 0.674 u[IU]/mL (ref 0.350–4.500)

## 2014-08-19 LAB — POCT GLYCOSYLATED HEMOGLOBIN (HGB A1C): Hemoglobin A1C: 5.7

## 2014-08-19 NOTE — Patient Instructions (Signed)
I will call you once the results of all your lab work is available. Your A1c today is excellent at 5.7. I do not see a reason why you need to take your metformin. You can ease back down to 1 pill a day, and then we can discuss again on your next appointment stopping altogether.

## 2014-08-19 NOTE — Assessment & Plan Note (Signed)
Will Complete CMP today. Patient is far asymptomatic.

## 2014-08-19 NOTE — Assessment & Plan Note (Signed)
Patient with complaints again today surrounding thyroid function. There is still some mild thyroid fullness on exam today, consistent with my findings in November. Attempted to reassure patient, she has had ENT referral she has had an ultrasound of her thyroid which resulted with some benign small nodules that do not meet criteria for biopsy at this time. Will repeat TSH today. Discussed with patient that if it's negative today we likely will not follow up on it again this year.

## 2014-08-19 NOTE — Assessment & Plan Note (Signed)
Patient continues to complain of menorrhagia. Explained to her treatment plan for people with menorrhagia start with a form of birth control, despite her tubal ligation. This usually helps the bleeding pattern regularly and often decreases the bleeding pattern as well. Patient was reluctant to start any medications at this time. I also explained that she could consider a hysterectomy, if she was certain she did not want any more children. She states that she does not want anymore children. After long discussion today we decided to check her CBC see if she is anemic from her bleeding pattern, and reevaluate later to see if she needs a gynecological referral.

## 2014-08-19 NOTE — Assessment & Plan Note (Addendum)
Patient has hypochondriasis surrounding her thyroid and thyroid nodules as well as headaches and menorrhagia. She always has at least 5-10 complaints per office visit. Have tried on multiple occasions to start anxiety medication, with patient refusal. Patient responds best by asking her what the 3 most bothersome complaints are and listening to her complaints, explaining tests and outcomes as well as treatment options with her thoroughly.

## 2014-08-19 NOTE — Assessment & Plan Note (Signed)
Patient again encouraged to exercise 150 minutes a week, watching her diabetes less carbohydrates and sugars which she admittedly she still is doing. Today is reassuring at 5.7. Discussion surrounding her metformin use, I had advised her that she can go to 500 mg once daily. I'm not certain she actually needs this medication, but she seems to be fixated she at least once to take it once a day. I am okay with that, we will see what it doesn't 3 months if her A1c elevates then we will go back to twice a day otherwise she can discontinue the medication. Three Month follow-up

## 2014-08-19 NOTE — Progress Notes (Signed)
   Subjective:    Patient ID: [name], female    DOB: 1980/07/14, 34 y.o.   MRN: [id number]  HPI  Thyroid Nodule: Patient presents with complaints of thyroid fullness. She states she was in the emergency room a few weeks ago for a sore throat, and they told her that she has thyroid fullness. Them worked up for months ago with normal TSH, and ENT referral. Patient has known thyroid nodules, that do not meet criteria for biopsy at this time. Patient complains of weight loss, dry skin and hair loss. She is fearful that she has thyroid cancer.  Hypoglycemia: A she states that her sugar "is all over the place ". She states that sometimes it's lowest 63 and she feels shaky, and she scared to take her metformin. Patient's last A1c in November was 6.0. She states that sometimes she doesn't eat all meals, and sometimes she notes she feels that are too large for her. Since this is what is causing the fluctuation in her sugar.  Menorrhagia: Patient states that she experiences strong abdominal cramps during the time of her menses, and heavy periods. She is through 2 large pads every 2 hours. She denies any dizziness, that has been partially associated with her vertigo. She denies any fatigue. Denies any chest pain. Pt has her tubes tied.  Never smoker Past Medical History  Diagnosis Date  . Hx gestational diabetes   . Allergy   . Anxiety   . Depression   . Heart murmur   . Complication of anesthesia     Problem waking up once and a headache  . Spinal headache   . Family history of anesthesia complication     Mother had problem waking up  . Diabetes type 2, controlled 04/02/2014   Allergies  Allergen Reactions  . Amoxicillin Hives and Swelling  . Penicillins Hives and Swelling   Review of Systems Per HPI    Objective:   Physical Exam BP 118/59 mmHg  Pulse 74  Temp(Src) 97.9 F (36.6 C) (Oral)  Ht 5\' 2"  (1.575 m)  Wt 161 lb 4.8 oz (73.165 kg)  BMI 29.49 kg/m2  LMP 07/18/2014 Gen:  NAD. Nontoxic in appearance, well-developed, well-nourished, overweight. HEENT: AT. [place]. Bilateral TM visualized shiny/full. Bilateral eyes without injections or icterus. MMM. Bilateral nares pale, no swelling. Throat without erythema or exudates.  Neck: Supple, left sided thyroid fullness. CV: RRR  Chest: CTAB, no wheeze or crackles Abd: Soft.  NTND. BS present. No Masses palpated.  Ext: No erythema. No edema. +2/4 PT Psych: Anxious. Normal speech.    Assessment & Plan:

## 2014-08-20 ENCOUNTER — Telehealth: Payer: Self-pay | Admitting: Family Medicine

## 2014-08-20 NOTE — Telephone Encounter (Signed)
Please call pt, all her lab work is normal. I have sent her a copy for her labs. Her thyroid, hemoglobin and sugar are all normal. If she would like a gynecological referral for her  heavy periods, I would be happy to place to one so they may discuss all her options with her (but her "blood count" is normal). She should be encouraged to follow up in June, or sooner if needed. Thanks.

## 2014-08-20 NOTE — Telephone Encounter (Signed)
Called pt and informed her of below. Katharina Caper, April D

## 2014-09-24 ENCOUNTER — Encounter (HOSPITAL_COMMUNITY): Payer: Self-pay | Admitting: Emergency Medicine

## 2014-09-24 ENCOUNTER — Emergency Department (HOSPITAL_COMMUNITY)
Admission: EM | Admit: 2014-09-24 | Discharge: 2014-09-24 | Disposition: A | Discharge status: Home / Self Care | Payer: BLUE CROSS/BLUE SHIELD | Source: Home / Self Care | Attending: Family Medicine | Admitting: Family Medicine

## 2014-09-24 DIAGNOSIS — S39012A Strain of muscle, fascia and tendon of lower back, initial encounter: Secondary | ICD-10-CM

## 2014-09-24 MED ORDER — DICLOFENAC POTASSIUM 50 MG PO TABS: 50.0000 mg | ORAL_TABLET | Freq: Three times a day (TID) | ORAL | Status: DC

## 2014-09-24 MED ORDER — CYCLOBENZAPRINE HCL 5 MG PO TABS: 5.0000 mg | ORAL_TABLET | Freq: Three times a day (TID) | ORAL | Status: DC | PRN

## 2014-09-24 NOTE — ED Notes (Signed)
Pt states that she had her husband pulled her back to get a "crook" out and states that her back has been hurting ever since

## 2014-09-24 NOTE — ED Provider Notes (Signed)
CSN: [id number]     Arrival date & time 09/24/14  1952 History   First MD Initiated Contact with Patient 09/24/14 2037     Chief Complaint  Patient presents with  . Back Pain   (Consider location/radiation/quality/duration/timing/severity/associated sxs/prior Treatment) Patient is a 34 y.o. female presenting with back pain.  Back Pain Location:  Lumbar spine Quality:  Stabbing and shooting Radiates to:  Does not radiate Pain severity:  Moderate Onset quality:  Gradual Duration:  2 days Chronicity:  New Context: twisting   Context comment:  Onset after husband picked her up to manipulae back for her. Relieved by:  None tried Worsened by:  Nothing tried Associated symptoms: no abdominal pain, no bladder incontinence, no bowel incontinence, no dysuria, no fever, no leg pain, no numbness and no paresthesias   Risk factors: lack of exercise and obesity     Past Medical History  Diagnosis Date  . Hx gestational diabetes   . Allergy   . Anxiety   . Depression   . Heart murmur   . Complication of anesthesia     Problem waking up once and a headache  . Spinal headache   . Family history of anesthesia complication     Mother had problem waking up  . Diabetes type 2, controlled 04/02/2014   Past Surgical History  Procedure Laterality Date  . Nasal sinus surgery  2003  . Tubal ligation    . Cesarean section    . Cholecystectomy N/A 09/06/2013    Procedure: LAPAROSCOPIC CHOLECYSTECTOMY;  Surgeon: Harl Bowie, MD;  Location: [facility] OR;  Service: General;  Laterality: N/A;   Family History  Problem Relation Age of Onset  . Hypertension Mother   . Hyperlipidemia Mother   . Diabetes Other    History  Substance Use Topics  . Smoking status: Never Smoker   . Smokeless tobacco: Never Used  . Alcohol Use: No     Comment: hasn't had a drink since 07/07/2013   OB History    No data available     Review of Systems  Constitutional: Negative.  Negative for fever.   Gastrointestinal: Negative.  Negative for abdominal pain and bowel incontinence.  Genitourinary: Negative.  Negative for bladder incontinence and dysuria.  Musculoskeletal: Positive for back pain. Negative for gait problem.  Skin: Negative.   Neurological: Negative for numbness and paresthesias.    Allergies  Amoxicillin and Penicillins  Home Medications   Prior to Admission medications   Medication Sig Start Date End Date Taking? Authorizing Provider  cephALEXin (KEFLEX) 500 MG capsule Take 1 capsule (500 mg total) by mouth 4 (four) times daily. 04/02/14   Renee A Kuneff, DO  cyclobenzaprine (FLEXERIL) 5 MG tablet Take 1 tablet (5 mg total) by mouth 3 (three) times daily as needed for muscle spasms. 09/24/14   Billy Fischer, MD  diclofenac (CATAFLAM) 50 MG tablet Take 1 tablet (50 mg total) by mouth 3 (three) times daily. For back pain 09/24/14   Billy Fischer, MD  fluticasone [facility]) 50 MCG/ACT nasal spray Place 2 sprays into both nostrils daily. 07/29/14   Harden Mo, MD  levofloxacin (LEVAQUIN) 500 MG tablet Take 1 tablet (500 mg total) by mouth daily. 07/29/14   Harden Mo, MD  metFORMIN (GLUCOPHAGE) 500 MG tablet Take 1 tablet (500 mg total) by mouth 2 (two) times daily with a meal. 04/02/14   Renee A Kuneff, DO  ondansetron (ZOFRAN-ODT) 4 MG disintegrating tablet DISSOLVE ONE TABLET UNDER  THE TONGUE EVERY 8 HOURS AS NEEDED FOR NAUSEA 08/08/14   Renee A Kuneff, DO  traMADol (ULTRAM) 50 MG tablet Take 2 tablets (100 mg total) by mouth every 8 (eight) hours as needed. 07/29/14   Harden Mo, MD   BP 136/76 mmHg  Pulse 72  Temp(Src) 99 F (37.2 C) (Oral)  Resp 18  SpO2 100%  LMP 08/16/2014 Physical Exam  Constitutional: She is oriented to person, place, and time. She appears well-developed and well-nourished.  Abdominal: Soft. Bowel sounds are normal.  Musculoskeletal: She exhibits tenderness.       Lumbar back: She exhibits decreased range of motion, tenderness, bony  tenderness, pain and spasm. She exhibits no swelling and normal pulse.  Neurological: She is alert and oriented to person, place, and time.  Skin: Skin is warm and dry.  Nursing note and vitals reviewed.   ED Course  Procedures (including critical care time) Labs Review Labs Reviewed - No data to display  Imaging Review No results found.   MDM   1. Low back strain, initial encounter        Billy Fischer, MD 09/24/14 2105

## 2014-10-28 ENCOUNTER — Encounter: Payer: Self-pay | Admitting: Family Medicine

## 2014-10-28 ENCOUNTER — Ambulatory Visit (INDEPENDENT_AMBULATORY_CARE_PROVIDER_SITE_OTHER): Payer: BLUE CROSS/BLUE SHIELD | Admitting: Family Medicine

## 2014-10-28 VITALS — BP 116/75 | HR 72 | Temp 98.3°F | Ht 62.0 in | Wt 164.0 lb

## 2014-10-28 DIAGNOSIS — H938X1 Other specified disorders of right ear: Secondary | ICD-10-CM

## 2014-10-28 NOTE — Patient Instructions (Addendum)
Our office or the ENT office will be in touch soon.  Follow up closely with your PCP.  Take care  Dr. Lacinda Axon

## 2014-10-29 DIAGNOSIS — H93A1 Pulsatile tinnitus, right ear: Secondary | ICD-10-CM | POA: Insufficient documentation

## 2014-10-29 NOTE — Progress Notes (Signed)
   Subjective:    Patient ID: [name], female    DOB: February 27, 1981, 34 y.o.   MRN: [id number]  HPI 34 year old female with a history of anxiety presents for same day appointment with complaints of a loud noise in her right ear.  Patient reports that she's being experiencing a loud, pulsating noise in her right ear for the past week.  She reports that this is bothering her and interfering with her sleep. She reports some associated ear discomfort. No exacerbating or relieving factors. No recent fevers or chills. She does note that she had a URI approximately 2 weeks ago.  Review of Systems Per HPI    Objective:   Physical Exam Filed Vitals:   10/28/14 1113  BP: 116/75  Pulse: 72  Temp: 98.3 F (36.8 C)   Vital signs reviewed.  Exam: General: well appearing anxious female in no acute distress. HEENT: NCAT. Left TM normal. Right TM - central portion noted to be white and not completely translucent. Effusion noted.  Oropharynx clear. Neck: Supple. No adenopathy. Cardiovascular: RRR. No murmurs, rubs, or gallops. Respiratory: CTAB. No rales, rhonchi, or wheeze.    Assessment & Plan:  See Problem List

## 2014-10-29 NOTE — Assessment & Plan Note (Signed)
Unclear etiology. Referring to ENT for evaluation and potential audiological evaluation. Follow-up with PCP as indicated. This case was precepted with attending Dr. Mingo Amber.

## 2014-12-10 ENCOUNTER — Ambulatory Visit (INDEPENDENT_AMBULATORY_CARE_PROVIDER_SITE_OTHER): Payer: BLUE CROSS/BLUE SHIELD | Admitting: Family Medicine

## 2014-12-10 VITALS — BP 127/70 | HR 72 | Temp 98.3°F | Ht 63.0 in | Wt 163.2 lb

## 2014-12-10 DIAGNOSIS — R51 Headache: Secondary | ICD-10-CM

## 2014-12-10 DIAGNOSIS — R519 Headache, unspecified: Secondary | ICD-10-CM

## 2014-12-10 MED ORDER — CYCLOBENZAPRINE HCL 5 MG PO TABS: 5.0000 mg | ORAL_TABLET | Freq: Three times a day (TID) | ORAL | Status: DC | PRN

## 2014-12-10 NOTE — Patient Instructions (Addendum)
Thank you for coming in,   I refilled the flexeril.   You can try capsaicin cream to help relax your muscles as well.   You can use ibuprofen for your pain.   If your pain continues or gets worse please return to clinic.   Please bring all of your medications with you to each visit.    Please feel free to call with any questions or concerns at any time, at [phone]. --Dr. Raeford Razor

## 2014-12-10 NOTE — Progress Notes (Signed)
   Subjective:    Patient ID: [name], female    DOB: 1980/08/16, 34 y.o.   MRN: [id number]  Seen for Same day visit for   CC: HA   Headache started more than two weeks. Last two weeks it was constant until Sunday it was relieved.  She was in bed all day Friday and Saturday. Reports some pain in her right shoulder and neck.  Started having headaches since her mother was diagnosed with cancer.  She had a normal MRI brain last year.  Was seen by ENT and no abnormalities were found.   Pain is sharp  Severity:   10/10 Location: Temporal and mainly on right side.  Medications tried: tylenol  Head trauma: no Sudden onset: gradual  Previous similar headaches: 12-13 years ago she was dx with migraines but dx with pituitary adenoma.  Taking blood thinners: no History of cancer: no  Symptoms Nose congestion stuffiness:  no Nausea vomiting: nausea occasional  Photophobia: yes  Noise sensitivity: no Double vision or loss of vision: no Fever: no Neck Stiffness: no Trouble walking or speaking: no  PMH - Smoking status noted.    Review of Systems   See HPI for ROS. Objective:  BP 127/70 mmHg  Pulse 72  Temp(Src) 98.3 F (36.8 C) (Oral)  Ht 5\' 3"  (1.6 m)  Wt 163 lb 3.2 oz (74.027 kg)  BMI 28.92 kg/m2  General: NAD HEENT: PERRL, EOMI, subconjunctival hemorrhage of left eye, TM's clear and intact, no LAD, no tonsillar exudates, uvula midline  Cardiac: RRR, normal heart sounds, no murmurs.  Respiratory: CTAB, normal effort Extremities: no edema or cyanosis. WWP. Skin: warm and dry, no rashes noted MSK: some pain with palpation of left trapezius  Neuro: CN 2-12 intact, normal finger to nose, normal rapid hand alternating movements, normal gait, normal sensation in LE b/l, 5/5 strength in UE/LE b/l, no cervical spine tenderness,      Assessment & Plan:  See Problem List Documentation

## 2014-12-11 DIAGNOSIS — R519 Headache, unspecified: Secondary | ICD-10-CM | POA: Insufficient documentation

## 2014-12-11 DIAGNOSIS — R51 Headache: Secondary | ICD-10-CM

## 2014-12-11 NOTE — Assessment & Plan Note (Addendum)
Most likely related to her anxiety/hypochondriasis vs tension. No deficits on exam which is reassuring. Some tightness in her trapezius and her mother was diagnosed with cancer last year which has caused her stress. MRI is reassuring from 2015. Thyroid has been checked recently as well.  - advised capsaicin cream to rub on her trapezius and also given back exercises/stretching - reports that flexeril has helped before so refilled today  - ibuprofen for pain PRN  - f/u with PCP if continues

## 2015-01-15 ENCOUNTER — Ambulatory Visit: Payer: Self-pay | Admitting: Family Medicine

## 2015-01-21 ENCOUNTER — Ambulatory Visit (INDEPENDENT_AMBULATORY_CARE_PROVIDER_SITE_OTHER): Payer: BLUE CROSS/BLUE SHIELD | Admitting: Family Medicine

## 2015-01-21 VITALS — BP 120/71 | HR 72 | Temp 98.5°F | Ht 63.0 in | Wt 164.0 lb

## 2015-01-21 DIAGNOSIS — E0789 Other specified disorders of thyroid: Secondary | ICD-10-CM | POA: Diagnosis not present

## 2015-01-21 DIAGNOSIS — R221 Localized swelling, mass and lump, neck: Secondary | ICD-10-CM | POA: Diagnosis not present

## 2015-01-21 LAB — CBC
HEMATOCRIT: 38.1 % (ref 36.0–46.0)
HEMOGLOBIN: 13.1 g/dL (ref 12.0–15.0)
MCH: 31.6 pg (ref 26.0–34.0)
MCHC: 34.4 g/dL (ref 30.0–36.0)
MCV: 91.8 fL (ref 78.0–100.0)
MPV: 10.4 fL (ref 8.6–12.4)
PLATELETS: 313 10*3/uL (ref 150–400)
RBC: 4.15 MIL/uL (ref 3.87–5.11)
RDW: 13.5 % (ref 11.5–15.5)
WBC: 8.7 10*3/uL (ref 4.0–10.5)

## 2015-01-21 LAB — BASIC METABOLIC PANEL
BUN: 16 mg/dL (ref 7–25)
CHLORIDE: 101 mmol/L (ref 98–110)
CO2: 27 mmol/L (ref 20–31)
Calcium: 9.9 mg/dL (ref 8.6–10.2)
Creat: 0.71 mg/dL (ref 0.50–1.10)
Glucose, Bld: 103 mg/dL — ABNORMAL HIGH (ref 65–99)
Potassium: 4.5 mmol/L (ref 3.5–5.3)
Sodium: 137 mmol/L (ref 135–146)

## 2015-01-21 LAB — TSH: TSH: 0.882 u[IU]/mL (ref 0.350–4.500)

## 2015-01-21 NOTE — Patient Instructions (Addendum)
Thank you so much for coming to visit me today. We will check your thyroid function today. I will also place a referral to Endocrinology. Please let us know if there is anything else we can do for you!  Thanks again! Dr. Gerlean Ren

## 2015-01-23 ENCOUNTER — Encounter: Payer: Self-pay | Admitting: Family Medicine

## 2015-01-27 NOTE — Assessment & Plan Note (Signed)
-   Has recurring symptoms with normal thyroid levels. Will recheck TSH today - [place] with thyroid cysts not large enough to recommend biopsy - Will refer to endocrinology

## 2015-01-27 NOTE — Progress Notes (Signed)
Subjective:     Patient ID: Arletha Pili, female   DOB: Jun 10, 1980, 34 y.o.   MRN: [id number]  HPI Mrs. Hagwood is a 34yo female presenting with many complaints today, mainly centralized around concern for abnormal thyroid. - Again complains of swelling in neck over thyroid, hoarseness, feeling weak and tired, swollen eyes, nails breaking, dry skin, sore skin, ear pain, headaches, dizziness, swollen feet - States she knows it is her thyroid but every time she comes to the office it is normal  - LMP 2 weeks ago and symptoms started two days after it ended - Has history of pituitary adenoma, resected in 2015 - Normal TSH noted over last year: 1.036>0.674>0.882  - Chart Review shows many visits for thyroid in past with negative workup thus far. - 01/2012: Reported heat intolerance for 2-3 weeks. Reported history of thyroid issues when she was pregnant. TSH normal. -10/2012: Reported history of hot flashes, intolerance to heat, retaining fluid, weight gain, and low libido. Also reported hoarseness. Neck fullness noted but believed to be due to subcutaneous fat, no nodules palpated. TSH normal. Anxiety believed to be contributing to symptoms. - 03/2014: Reported fullness in her neck, dry skin, hair loss, constipation, fatigue, and headaches. Thyroid US with bilateral cystic nodules less than 0.5cm, which did not meet criteria for biopsy. Referred to ENT given hoarseness with normal workup. -06/2014: Concerned about thyroid cysts. Discussed treatment not indicated given labs and imaging results.   Review of Systems Per HPI    Objective:   Physical Exam  Constitutional: She appears well-developed and well-nourished. No distress.  HENT:  Head: Normocephalic and atraumatic.  Mouth/Throat: Oropharynx is clear and moist.  Normal tympanic membranes bilaterally  Eyes: Pupils are equal, round, and reactive to light. Right eye exhibits no discharge. Left eye exhibits no discharge.  Neck: Normal range of  motion. Neck supple. No thyromegaly present.  Cardiovascular: Normal rate and regular rhythm.  Exam reveals no gallop and no friction rub.   No murmur heard. Pulmonary/Chest: Effort normal. No respiratory distress. She has no wheezes. She has no rales.  Abdominal: Soft. Bowel sounds are normal. She exhibits no distension. There is no tenderness.  Musculoskeletal: She exhibits no edema.  Lymphadenopathy:    She has no cervical adenopathy.  Neurological: She is alert.  Skin: Skin is warm and dry. No rash noted.  Psychiatric: She has a normal mood and affect. Her behavior is normal.       Assessment and Plan:     Thyroid fullness - Has recurring symptoms with normal thyroid levels. Will recheck TSH today - [place] with thyroid cysts not large enough to recommend biopsy - Will refer to endocrinology

## 2015-01-28 ENCOUNTER — Encounter: Payer: Self-pay | Admitting: Student

## 2015-01-28 ENCOUNTER — Telehealth: Payer: Self-pay | Admitting: Student

## 2015-01-28 NOTE — Telephone Encounter (Signed)
Letter with results already written on 9/1 and routed to admin for mailing.

## 2015-01-28 NOTE — Telephone Encounter (Signed)
I didn't see this patient on Tuesday. Wouldn't it be better if a physician who saw her write her the letter? Thanks,

## 2015-01-28 NOTE — Telephone Encounter (Signed)
Wants results from last Tuesdays blood work [phone]

## 2015-01-28 NOTE — Telephone Encounter (Signed)
Forwarded message to Dr. Gerlean Ren. Ottis Stain, CMA

## 2015-01-31 NOTE — Telephone Encounter (Signed)
Letter resent. Katharina Caper, April D, [place]

## 2015-03-19 ENCOUNTER — Encounter: Payer: Self-pay | Admitting: Family Medicine

## 2015-03-19 ENCOUNTER — Ambulatory Visit (INDEPENDENT_AMBULATORY_CARE_PROVIDER_SITE_OTHER): Payer: BLUE CROSS/BLUE SHIELD | Admitting: Family Medicine

## 2015-03-19 VITALS — BP 115/52 | Ht 62.0 in | Wt 155.0 lb

## 2015-03-19 DIAGNOSIS — Q6689 Other  specified congenital deformities of feet: Secondary | ICD-10-CM | POA: Diagnosis not present

## 2015-03-19 HISTORY — DX: Other specified congenital deformities of feet: Q66.89

## 2015-03-19 NOTE — Patient Instructions (Signed)
[facility] Dr. Marney Setting. [facility] Tuesday November 1st at 10:30am [address] [place], [place] [address] Phone: [phone] Fax : [phone]

## 2015-03-19 NOTE — Assessment & Plan Note (Signed)
CT scan reviewed from 2006.  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  Continues to have severe pain with hindfoot valgus - Referral to [facility] for possible surgical laser therapy for coalition.  Since only 33, she may need this to prevent chronic problems in her knees/back as well.

## 2015-03-19 NOTE — Progress Notes (Signed)
  [name] - 34 y.o. female MRN [id number]  Date of birth: Aug 31, 1980 [name] is a 34 y.o. female who presents today for left ankle stiffness.  Left ankle stiffness, initial visit-patient presents today for ongoing left ankle stiffness and pain. 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 She was originally given a brace to help with this which helped somewhat along with pain control. However she has been having worsening pain and limitation of motion. Inhalation is now painful at this time. She did previously had discussions about surgical laser treatment at [facility] but that she was not interested at that time.  PMHx - Updated and reviewed.  Contributory factors include: Tarsal coalition L ankle PSHx - Updated and reviewed.  Contributory factors include:  Noncontributory  FHx - Updated and reviewed.  Contributory factors include:  noncontributory Medications - Reviewed and updated   ROS Per HPI.  12 point negative other than per HPI.   Exam:  Filed Vitals:   03/19/15 1457  BP: 115/52   Gen: NAD Cardiorespiratory - Normal respiratory effort/rate.  RRR Skin: No rashes or erythema Extremities:  No edema.  Pulses + 2 B/L LE.   Neuro: sensation intact B/L LE.  MS 5/5 in all planes  L Ankle: No visible erythema or swelling. Range of motion limited in inversion/everse of L ankle at subtalar joint.  ROM nml in dorsi/planterflexion  Strength is 5/5 in all directions. Stable lateral and medial ligaments; squeeze test and kleiger test unremarkable; Talar dome TTP lateral trochlea; No pain at base of 5th MT; No tenderness over cuboid; No tenderness over N spot or navicular prominence No tenderness on posterior aspects of lateral and medial malleolus No sign of peroneal tendon subluxations; Negative tarsal tunnel tinel's Able to walk 4  steps. + hindfoot valgus with pes planus deformity  Imaging:  Reviewed CT 2006

## 2015-03-19 NOTE — Addendum Note (Signed)
Addended by: Cyd Silence on: 03/19/2015 05:07 PM   Modules accepted: Orders

## 2015-03-25 DIAGNOSIS — M79672 Pain in left foot: Secondary | ICD-10-CM | POA: Insufficient documentation

## 2015-04-22 DIAGNOSIS — M19072 Primary osteoarthritis, left ankle and foot: Secondary | ICD-10-CM | POA: Insufficient documentation

## 2015-04-22 DIAGNOSIS — Q6682 Congenital vertical talus deformity, left foot: Secondary | ICD-10-CM | POA: Insufficient documentation

## 2015-04-22 HISTORY — DX: Primary osteoarthritis, left ankle and foot: M19.072

## 2015-04-22 HISTORY — DX: Congenital vertical talus deformity, left foot: Q66.82

## 2015-04-25 ENCOUNTER — Encounter: Payer: Self-pay | Admitting: Student

## 2015-04-25 ENCOUNTER — Ambulatory Visit (INDEPENDENT_AMBULATORY_CARE_PROVIDER_SITE_OTHER): Payer: BLUE CROSS/BLUE SHIELD | Admitting: Student

## 2015-04-25 VITALS — BP 110/78 | HR 69 | Temp 98.1°F | Ht 62.0 in | Wt 158.0 lb

## 2015-04-25 DIAGNOSIS — R7303 Prediabetes: Secondary | ICD-10-CM

## 2015-04-25 DIAGNOSIS — Z8639 Personal history of other endocrine, nutritional and metabolic disease: Secondary | ICD-10-CM

## 2015-04-25 DIAGNOSIS — E1169 Type 2 diabetes mellitus with other specified complication: Secondary | ICD-10-CM | POA: Diagnosis not present

## 2015-04-25 DIAGNOSIS — H938X1 Other specified disorders of right ear: Secondary | ICD-10-CM

## 2015-04-25 DIAGNOSIS — E119 Type 2 diabetes mellitus without complications: Secondary | ICD-10-CM | POA: Diagnosis not present

## 2015-04-25 DIAGNOSIS — E0789 Other specified disorders of thyroid: Secondary | ICD-10-CM

## 2015-04-25 DIAGNOSIS — M545 Low back pain: Secondary | ICD-10-CM

## 2015-04-25 DIAGNOSIS — E785 Hyperlipidemia, unspecified: Secondary | ICD-10-CM

## 2015-04-25 DIAGNOSIS — M130 Polyarthritis, unspecified: Secondary | ICD-10-CM | POA: Diagnosis not present

## 2015-04-25 DIAGNOSIS — Z0000 Encounter for general adult medical examination without abnormal findings: Secondary | ICD-10-CM

## 2015-04-25 DIAGNOSIS — F4521 Hypochondriasis: Secondary | ICD-10-CM

## 2015-04-25 LAB — BASIC METABOLIC PANEL WITH GFR
BUN: 14 mg/dL (ref 7–25)
CO2: 25 mmol/L (ref 20–31)
Calcium: 9 mg/dL (ref 8.6–10.2)
Chloride: 104 mmol/L (ref 98–110)
Creat: 0.67 mg/dL (ref 0.50–1.10)
GFR, Est Non African American: >89 mL/min (ref >=60)
Glucose, Bld: 97 mg/dL (ref 65–99)
POTASSIUM: 4.6 mmol/L (ref 3.5–5.3)
SODIUM: 139 mmol/L (ref 135–146)

## 2015-04-25 LAB — T4, FREE: FREE T4: 0.98 ng/dL (ref 0.80–1.80)

## 2015-04-25 LAB — POCT GLYCOSYLATED HEMOGLOBIN (HGB A1C): Hemoglobin A1C: 5.5

## 2015-04-25 LAB — TSH: TSH: 0.909 u[IU]/mL (ref 0.350–4.500)

## 2015-04-25 LAB — VITAMIN B12: VITAMIN B 12: 820 pg/mL (ref 211–911)

## 2015-04-25 NOTE — Assessment & Plan Note (Addendum)
Multiple somatic complaints. Offered her trial of TCA that she declined saying she doesn't want to start new medication. She says she would like to enjoy her holiday. She will decide on this when she comes back for her follow up. Ordered vitamin B-12 levels, TFT, ANA and anti-CCP for now. Follow up in 4 weeks or sooner.

## 2015-04-25 NOTE — Assessment & Plan Note (Signed)
Continues to be a problem. Had referral to ENT in 10/2014. She was told her ear was normal by audiologist/ENT. However, her note from ENT (under media tab) sates chronic eustachian salpingitis and TMJ arthralgia with normal tympanogram. Weber and Rinne tests are negative today. Ear exam with normal TM and land marks. Differential diagnosis are vascular such are arterial bruits which is negative on exam. neurologic disorder that could cause inner ear muscle spasm is also possible.  MS is a possibility with her multiple complaints but unusual presentation. Her MRI from two years ago is negative for this. She is on Meloxicam but her symptoms has started before she was on this. There is no temporal association. Unfortunately there is no explanation for her symptoms. However, I am not suspicious for life threatening pathology. -Reassured patient  -follow up in 4 weeks or sooner -Offered a trial of TCA. However, patient declined it.

## 2015-04-25 NOTE — Progress Notes (Signed)
Subjective:    Patient ID: [name], female    DOB: 16-Jun-1980, 34 y.o.   MRN: [id number]  HPI  Pulsatile noise in right ear:  this has been going for a year. She was here with similar complaint in the past. She was seen by ENT was told her ear is normal. She don't remember the ENT surgeon or office she went to. However, she continue to have the ringing. She also has diminished hearing in right ear compared to left. She denies trauma, previous ear surgeries. She denies fever, pain or ear discharge. She says the ringing is worse when she has headache.  Left ankle pain: for 10 years. She has surgery scheduled for 06/16/2015 at [facility]. She also has bilateral knee pain for two weeks. Pain 4/10. She is taking Meloxicam that she was prescribed by an orthopedic surgery at [facility]. She endorses crepitus with moving. She has no fever. She also has numbness in her toes and both arms distal to elbow for about 4 months. She reports waking up with stiffness and numbness in her hands and fingers that lasts about 30 minutes.   Multiple complaints: she has multiple symptoms including abdominal pain, constipation, headaches, dizziness, memory problems and joint pains. PHQ-2 negative. She denies fatigue. TSH and free T4 normal but on low side three months ago. She has history of anxiety in the past but says that has resolved now. She denies recreational drug use or smoking.   Health maintenance:  -A1c 5.5 today. She has been on metformin. Has a diagnosis for diabetes in chart but her A1c has never been over 6.0. She has no signs and symptoms of diabetes today.  -Has been to her dentist yesterday.   -Both pap smear and HPV negative about two years ago.  -She hasn't been exercising a lot.  -Reports drinking EtOH about 3-4 drinks at a time but less than monthly. -PHQ-2 negative today -Weight stable   Past medical history: pituitary adenoma, menometrorrhagia, ear pain, hypochondriasis, anxiety, tyroid  fullness  Family history: hyperlipidemia in her two children  Review of Systems Per HPI    Objective:   Physical Exam Filed Vitals:   04/25/15 0841  BP: 110/78  Pulse: 69  Temp: 98.1 F (36.7 C)  TempSrc: Oral  Height: 5\' 2"  (1.575 m)  Weight: 158 lb (71.668 kg)  SpO2: 98%   Gen: appears well Eyes: pupils equal, round and reactive to light Ear: weber and rinne within normal, normal tm with normal canal and landmarks Oropharynx: clear, moist Neck: supple, no LAD, no thyrodomegally CV: regular rate and rhythm, S1 & S2 audible Resp: no apparent work of breathing, clear to auscultation bilaterally. Abd: bowel sounds normal, soft, no tenderness to palpation, no rebound or guarding.  Ext: No edema, no swelling.    Assessment & Plan:  Pounding noise in right ear Continues to be a problem. Had referral to ENT in 10/2014. She was told her ear was normal by audiologist/ENT. However, her note from ENT (under media tab) sates chronic eustachian salpingitis and TMJ arthralgia with normal tympanogram. Weber and Rinne tests are negative today. Ear exam with normal TM and land marks. Differential diagnosis are vascular such are arterial bruits which is negative on exam. neurologic disorder that could cause inner ear muscle spasm is also possible.  MS is a possibility with her multiple complaints but unusual presentation. Her MRI from two years ago is negative for this. She is on Meloxicam but her symptoms has started  before she was on this. There is no temporal association. Unfortunately there is no explanation for her symptoms. However, I am not suspicious for life threatening pathology. -Reassured patient  -follow up in 4 weeks or sooner -Offered a trial of TCA. However, patient declined it.   Thyroid fullness Neck exam unremarkable. Her last TSH and Free T4 within normal but on low side. Denies fatigue today. Weight stable. Has history of pituitary adenoma. Her last two MRI's reassuring.   -Ordered TSH and free T4 today.   Prediabetes History to type-2 diabetes and on metformin per her chart. However, her A1c has never been over 6. It is 5.5 today. -Discontinued metformin today -No need for foot exam.  Hypochondriasis Multiple somatic complaints. Offered her trial of TCA that she declined saying she doesn't want to start new medication. She says she would like to enjoy her holiday. She will decide on this when she comes back for her follow up. Ordered vitamin B-12 levels, TFT, ANA and anti-CCP for now. Follow up in 4 weeks or sooner.  Health care maintenance -A1c 5.5 today. She has been on metformin. Has a diagnosis for diabetes in chart but her A1c has never been over 6.0. She has no signs and symptoms of diabetes today.  -Has been to her dentist yesterday.  -Both pap smear and HPV negative about two years ago.  -She hasn't been exercising a lot. Encourage her to be more active -Reports drinking EtOH about 3-4 drinks at a time but less than monthly. Encourage her to limit to 1 drink a day especially with the holiday here. -PHQ-2 negative today -Weight stable. Lost few pounds compared to last year.

## 2015-04-25 NOTE — Assessment & Plan Note (Signed)
History to type-2 diabetes and on metformin per her chart. However, her A1c has never been over 6. It is 5.5 today. -Discontinued metformin today -No need for foot exam.

## 2015-04-25 NOTE — Patient Instructions (Signed)
It was great seeing you today! We have addressed the following issues today  1. Rigning in your ear: I have ordered some labs. I would like to see you in one month. 2. Body pain: I have ordered some labs. I would like to see you in one month.   If we did any lab work today, and the results require attention, either me or my nurse will get in touch with you. If everything is normal, you will get a letter in mail. If you don't hear from [place] in two weeks, please give [place] a call. Otherwise, I look forward to talking with you again at our next visit. If you have any questions or concerns before then, please call the clinic at [phone].  Please bring all your medications to every doctors visit   Sign up for My Chart to have easy access to your labs results, and communication with your Primary care physician.    Please check-out at the front desk before leaving the clinic.   Take Care,

## 2015-04-25 NOTE — Assessment & Plan Note (Signed)
-  A1c 5.5 today. She has been on metformin. Has a diagnosis for diabetes in chart but her A1c has never been over 6.0. She has no signs and symptoms of diabetes today.  -Has been to her dentist yesterday.  -Both pap smear and HPV negative about two years ago.  -She hasn't been exercising a lot. Encourage her to be more active -Reports drinking EtOH about 3-4 drinks at a time but less than monthly. Encourage her to limit to 1 drink a day especially with the holiday here. -PHQ-2 negative today -Weight stable. Lost few pounds compared to last year.

## 2015-04-25 NOTE — Assessment & Plan Note (Signed)
Neck exam unremarkable. Her last TSH and Free T4 within normal but on low side. Denies fatigue today. Weight stable. Has history of pituitary adenoma. Her last two MRI's reassuring.  -Ordered TSH and free T4 today.

## 2015-04-26 LAB — LIPID PANEL
CHOL/HDL RATIO: 4.1 ratio (ref <=5.0)
CHOLESTEROL: 178 mg/dL (ref 125–200)
HDL: 43 mg/dL — ABNORMAL LOW (ref >=46)
LDL Cholesterol: 110 mg/dL (ref <130)
Triglycerides: 127 mg/dL (ref <150)
VLDL: 25 mg/dL (ref <30)

## 2015-04-28 ENCOUNTER — Encounter: Payer: Self-pay | Admitting: Student

## 2015-04-28 LAB — ANA: ANA: NEGATIVE

## 2015-04-28 LAB — CYCLIC CITRUL PEPTIDE ANTIBODY, IGG: Cyclic Citrullin Peptide Ab: <16 Units

## 2015-04-28 LAB — ANTI-DNA ANTIBODY, DOUBLE-STRANDED: ds DNA Ab: <1 IU/mL

## 2015-04-28 MED ORDER — BACLOFEN 10 MG PO TABS: 10.0000 mg | ORAL_TABLET | Freq: Three times a day (TID) | ORAL | Status: DC

## 2015-04-28 NOTE — Addendum Note (Signed)
Addended by: Wendee Beavers T on: 04/28/2015 11:26 AM   Modules accepted: Orders

## 2015-04-30 ENCOUNTER — Telehealth: Payer: Self-pay | Admitting: Student

## 2015-04-30 ENCOUNTER — Encounter: Payer: Self-pay | Admitting: Student

## 2015-04-30 NOTE — Telephone Encounter (Signed)
Would like test results. °

## 2015-04-30 NOTE — Telephone Encounter (Signed)
It has already been mailed out. It may take some times before she receives it. Thanks,

## 2015-05-01 NOTE — Telephone Encounter (Signed)
LVM for pt to give the office a call. Please inform her that the test results have been mailed but it may take a few days for her to receive them. Ottis Stain, CMA

## 2015-05-23 ENCOUNTER — Ambulatory Visit (INDEPENDENT_AMBULATORY_CARE_PROVIDER_SITE_OTHER): Payer: BLUE CROSS/BLUE SHIELD | Admitting: Student

## 2015-05-23 ENCOUNTER — Encounter: Payer: Self-pay | Admitting: Student

## 2015-05-23 VITALS — BP 125/63 | HR 66 | Temp 97.8°F | Ht 62.0 in | Wt 160.0 lb

## 2015-05-23 DIAGNOSIS — H938X1 Other specified disorders of right ear: Secondary | ICD-10-CM

## 2015-05-23 DIAGNOSIS — Z23 Encounter for immunization: Secondary | ICD-10-CM | POA: Diagnosis not present

## 2015-05-23 DIAGNOSIS — M79662 Pain in left lower leg: Secondary | ICD-10-CM | POA: Insufficient documentation

## 2015-05-23 NOTE — Patient Instructions (Addendum)
I am sorry about your loss! We have addressed the following issues today  1. Leg pain: I would like you to continue Meloxicam. I also like you to pick your baclofen from pharmacy and take it. It the pharmacy don't have the prescription, please give me a call. I also like you to try Aspercreme, which is available over the counter. Things to watch are swelling in your leg, shortness of breath, chest pain or spitting up blood. If you notice any of these, please return to clinic or go to emergency department 2. Ringing in your ear: I am glad it is getting better.  3. I would like you to come back and see me in two weeks.     If we did any lab work today, and the results require attention, either me or my nurse will get in touch with you. If everything is normal, you will get a letter in mail. If you don't hear from [place] in two weeks, please give [place] a call. Otherwise, I look forward to talking with you again at our next visit. If you have any questions or concerns before then, please call the clinic at [phone].  Please bring all your medications to every doctors visit   Sign up for My Chart to have easy access to your labs results, and communication with your Primary care physician.    Please check-out at the front desk before leaving the clinic.   Happy New Year!

## 2015-05-23 NOTE — Progress Notes (Signed)
   Subjective:    Patient ID: [name], female    DOB: [birth year], 34 y.o.   MRN: [id number]  HPI Left calf pain: this has been going on for about two weeks. Denies injury or trauma. Pain is worse with ambulation. Pain is about the same intensity since started. She says there was a bruise that has resolved. She tried Meloxicam for three days that has helped a little bit. She has severe degenerative disease of left ankle. She is being followed by Ortho at [facility]. She has surgery scheduled for next month. She denies fever, swelling, chest pain or shortness of breath. She is not on OCP. She don't have history of blood clotting.  Off note, patient lost her mother in [place] to cervical cancer about 6 days ago. She says her mother has gone through a lot and it is a relief now. It has been very stressful to her to see her mother suffering. She says she is coping well. She says she has a lot to live for: her children, husband and her friends. She denies depressed mood.   Ear ringing: patient says it has improved since her mother passed away. She thinks this was related to the stress about her mother when she was sick. She was evaluated by ENT in the past for the same problem. She was told that this all in head and there is nothing abnormal with her ear  Review of Systems Per HPI    Objective:   Physical Exam Filed Vitals:   05/23/15 0843  BP: 125/63  Pulse: 66  Temp: 97.8 F (36.6 C)  TempSrc: Oral  Height: 5\' 2"  (1.575 m)  Weight: 160 lb (72.576 kg)   Gen: appears well and pleasant Ears: normal TM, no effusion or erythema CV: regular rate and rythm. S1 & S2 audible Resp: no apparent work of breathing, clear to auscultation bilaterally. MSK:   -Left leg: no swelling, erythema. Left calf tender to palpation. Left ankle appear normal without swelling.     Assessment & Plan:  Pain of left calf This is likely musculoskeletal. She has chronic degenerative disease in her left ankle that could  have contributed. Has not risk factor for DVT. She has prescription for baclofen and Meloxicam. I have encouraged her to take these. I have also recommended Aspercreme. Gave return precautions (see after visit summary).   Pounding noise in right ear Improved since her mother passed away. She thinks this is likely stress related with her mother fighting cervical cancer for long time. She says it is a relief for her mother as she suffered a lot fighting the cancer. She reports having good support system. She says she has a lot to live for including her family and friends. I have offered her amitriptyline this time again. However, she declined it. I advised her to come back and see me in two weeks to make sure that she is coping well with her loss.

## 2015-05-23 NOTE — Assessment & Plan Note (Signed)
Improved since her mother passed away. She thinks this is likely stress related with her mother fighting cervical cancer for long time. She says it is a relief for her mother as she suffered a lot fighting the cancer. She reports having good support system. She says she has a lot to live for including her family and friends. I have offered her amitriptyline this time again. However, she declined it. I advised her to come back and see me in two weeks to make sure that she is coping well with her loss.

## 2015-05-23 NOTE — Assessment & Plan Note (Signed)
This is likely musculoskeletal. She has chronic degenerative disease in her left ankle that could have contributed. Has not risk factor for DVT. She has prescription for baclofen and Meloxicam. I have encouraged her to take these. I have also recommended Aspercreme. Gave return precautions (see after visit summary).

## 2015-06-05 ENCOUNTER — Ambulatory Visit: Payer: Self-pay | Admitting: Student

## 2015-06-22 ENCOUNTER — Emergency Department (HOSPITAL_COMMUNITY)
Admission: EM | Admit: 2015-06-22 | Discharge: 2015-06-22 | Disposition: A | Discharge status: Home / Self Care | Payer: BLUE CROSS/BLUE SHIELD | Source: Home / Self Care | Attending: Emergency Medicine | Admitting: Emergency Medicine

## 2015-06-22 ENCOUNTER — Encounter (HOSPITAL_COMMUNITY): Payer: Self-pay | Admitting: Emergency Medicine

## 2015-06-22 DIAGNOSIS — T887XXA Unspecified adverse effect of drug or medicament, initial encounter: Secondary | ICD-10-CM | POA: Diagnosis not present

## 2015-06-22 DIAGNOSIS — T50905A Adverse effect of unspecified drugs, medicaments and biological substances, initial encounter: Secondary | ICD-10-CM

## 2015-06-22 MED ORDER — PREDNISONE 10 MG PO TABS
ORAL_TABLET | ORAL | Status: DC
Start: 2015-06-22 — End: 2015-06-22

## 2015-06-22 MED ORDER — PREDNISONE 10 MG PO TABS
ORAL_TABLET | ORAL | Status: DC
Start: 2015-06-22 — End: 2016-01-19

## 2015-06-22 NOTE — ED Notes (Addendum)
The patient presented to the [facility] with a systemic rash that started yesterday and she stated that she woke this morning with swollen lips and hands. The patient stated that she has been taking oxycodone post surgery and feels that could be causing the symptoms. The patient stated that she took 25 mg of oral benadryl at 12:20 pm today.

## 2015-06-22 NOTE — ED Provider Notes (Signed)
CSN: [id number]     Arrival date & time 06/22/15  1304 History   First MD Initiated Contact with Patient 06/22/15 1327     Chief Complaint  Patient presents with  . Allergic Reaction   (Consider location/radiation/quality/duration/timing/severity/associated sxs/prior Treatment) Patient is a 35 y.o. female presenting with allergic reaction. The history is provided by the patient. No language interpreter was used.  Allergic Reaction Presenting symptoms: no swelling   Severity:  Moderate Context: no new detergents/soaps   Relieved by:  Nothing Worsened by:  Nothing tried  Pt complains of redness on hands and back Past Medical History  Diagnosis Date  . Hx gestational diabetes   . Allergy   . Anxiety   . Depression   . Heart murmur   . Complication of anesthesia     Problem waking up once and a headache  . Spinal headache   . Family history of anesthesia complication     Mother had problem waking up  . Diabetes type 2, controlled ([place]) 04/02/2014   Past Surgical History  Procedure Laterality Date  . Nasal sinus surgery  2003  . Tubal ligation    . Cesarean section    . Cholecystectomy N/A 09/06/2013    Procedure: LAPAROSCOPIC CHOLECYSTECTOMY;  Surgeon: Harl Bowie, MD;  Location: [facility] OR;  Service: General;  Laterality: N/A;   Family History  Problem Relation Age of Onset  . Hypertension Mother   . Hyperlipidemia Mother   . Diabetes Other    Social History  Substance Use Topics  . Smoking status: Never Smoker   . Smokeless tobacco: Never Used  . Alcohol Use: No     Comment: hasn't had a drink since 07/07/2013   OB History    No data available     Review of Systems  All other systems reviewed and are negative.   Allergies  Amoxicillin and Penicillins  Home Medications   Prior to Admission medications   Medication Sig Start Date End Date Taking? Authorizing Provider  acetaminophen (TYLENOL) 650 MG CR tablet Take 650 mg by mouth every 8 (eight) hours as  needed for pain.   Yes Historical Provider, MD  aspirin 325 MG EC tablet Take 325 mg by mouth daily.   Yes Historical Provider, MD  oxyCODONE (OXYCONTIN) 10 mg 12 hr tablet Take 10 mg by mouth every 12 (twelve) hours.   Yes Historical Provider, MD  baclofen (LIORESAL) 10 MG tablet Take 1 tablet (10 mg total) by mouth 3 (three) times daily. 04/28/15   Mercy Riding, MD  meloxicam (MOBIC) 15 MG tablet Take by mouth. 03/25/15 03/24/16  Historical Provider, MD  metFORMIN (GLUCOPHAGE) 500 MG tablet Take 1 tablet (500 mg total) by mouth 2 (two) times daily with a meal. 04/02/14   Renee A Kuneff, DO  predniSONE (DELTASONE) 10 MG tablet 6,5,4,3,2,1 taper 06/22/15   Fransico Meadow, PA-C   Meds Ordered and Administered this Visit  Medications - No data to display  BP 131/77 mmHg  Pulse 87  Temp(Src) 98 F (36.7 C) (Oral)  Resp 18  SpO2 100%  LMP 05/19/2015 (Exact Date) No data found.   Physical Exam  Constitutional: She appears well-developed and well-nourished.  HENT:  Head: Normocephalic.  Right Ear: External ear normal.  Nose: Nose normal.  Mouth/Throat: Oropharynx is clear and moist.  Eyes: Conjunctivae are normal. Pupils are equal, round, and reactive to light.  Cardiovascular: Normal rate.   Pulmonary/Chest: Effort normal.  Abdominal: Soft.  Musculoskeletal: Normal  range of motion.  Neurological: She is alert.  Skin: There is erythema.  Psychiatric: She has a normal mood and affect.  Nursing note and vitals reviewed.   ED Course  Procedures (including critical care time)  Labs Review Labs Reviewed - No data to display  Imaging Review No results found.   Visual Acuity Review  Right Eye Distance:   Left Eye Distance:   Bilateral Distance:    Right Eye Near:   Left Eye Near:    Bilateral Near:         MDM Pt is finished with pain medication.  I advised take tylenol    1. Drug reaction, initial encounter    Meds ordered this encounter  Medications  . aspirin  325 MG EC tablet    Sig: Take 325 mg by mouth daily.  Marland Kitchen oxyCODONE (OXYCONTIN) 10 mg 12 hr tablet    Sig: Take 10 mg by mouth every 12 (twelve) hours.  Marland Kitchen acetaminophen (TYLENOL) 650 MG CR tablet    Sig: Take 650 mg by mouth every 8 (eight) hours as needed for pain.  Marland Kitchen DISCONTD: predniSONE (DELTASONE) 10 MG tablet    Sig: 6,5,4,3,2,1 taper    Dispense:  15 tablet    Refill:  0    Order Specific Question:  Supervising Provider    Answer:  Melony Overly [id number]  . predniSONE (DELTASONE) 10 MG tablet    Sig: 6,5,4,3,2,1 taper    Dispense:  15 tablet    Refill:  0    Order Specific Question:  Supervising Provider    Answer:  Carmela Hurt      Fransico Meadow, PA-C 06/22/15 1437

## 2015-06-22 NOTE — Discharge Instructions (Signed)
Drug Allergy °Allergic reactions to medicines are common. Some allergic reactions are mild. A delayed type of drug allergy that occurs 1 week or more after exposure to a medicine or vaccine is called serum sickness. A life-threatening, sudden (acute) allergic reaction that involves the whole body is called anaphylaxis. °CAUSES  °"True" drug allergies occur when there is an allergic reaction to a medicine. This is caused by overactivity of the immune system. First, the body becomes sensitized. The immune system is triggered by your first exposure to the medicine. Following this first exposure, future exposure to the same medicine may be life-threatening. °Almost any medicine can cause an allergic reaction. Common ones are: °· Penicillin. °· Sulfonamides (sulfa drugs). °· Local anesthetics. °· X-ray dyes that contain iodine. °SYMPTOMS  °Common symptoms of a minor allergic reaction are: °· Swelling around the mouth. °· An itchy red rash or hives. °· Vomiting or diarrhea. °Anaphylaxis can cause swelling of the mouth and throat. This makes it difficult to breathe and swallow. Severe reactions can be fatal within seconds, even after exposure to only a trace amount of the drug that causes the reaction. °HOME CARE INSTRUCTIONS °· If you are unsure of what caused your reaction, write down: °¨ The names of the medicines you took. °¨ How much medicine you took. °¨ How you took the medicine, such as whether you took a pill, injected the medicine, or applied it to your skin. °¨ All of the things you ate and drank. °¨ The date and time of your reaction. °¨ The symptoms of the reaction. °· You may want to follow up with an allergy specialist after the reaction has cleared in order to be tested to confirm the allergy. It is important to confirm that your reaction is an allergy, not just a side effect to the medicine. If you have a true allergy to a medicine, this may prevent that medicine and related medicines from being given to  you when you are very ill. °· If you have hives or a rash: °¨ Take medicines as directed by your caregiver. °¨ You may use an over-the-counter antihistamine (diphenhydramine) as needed. °¨ Apply cold compresses to the skin or take baths in cool water. Avoid hot baths or showers. °· If you are severely allergic: °¨ Continuous observation after a severe reaction may be needed. Hospitalization is often required. °¨ Wear a medical alert bracelet or necklace stating your allergy. °¨ You and your family must learn how to use an anaphylaxis kit or give an epinephrine injection to temporarily treat an emergency allergic reaction. If you have had a severe reaction, always carry your epinephrine injection or anaphylaxis kit with you. This can be lifesaving if you have a severe reaction. °· Do not drive or perform tasks after treatment until the medicines used to treat your reaction have worn off, or until your caregiver says it is okay. °· If you have a drug allergy that was confirmed by your health care provider: °¨ Carry information about the drug allergy with you at all times. °¨ Always check with a pharmacist before taking any over-the-counter medicine. °SEEK MEDICAL CARE IF:  °· You think you had an allergic reaction. Symptoms usually start within 30 minutes after exposure. °· Symptoms are getting worse rather than better. °· You develop new symptoms. °· The symptoms that brought you to your caregiver return. °SEEK IMMEDIATE MEDICAL CARE IF:  °· You have swelling of the mouth, difficulty breathing, or wheezing. °· You have a tight   feeling in your chest or throat.  You develop hives, swelling, or itching all over your body.  You develop severe vomiting or diarrhea.  You feel faint or pass out. This is an emergency. Use your epinephrine injection or anaphylaxis kit as you have been instructed. Call for emergency medical help. Even if you improve after the injection, you need to be examined at a hospital emergency  department. MAKE SURE YOU:   Understand these instructions.  Will watch your condition.  Will get help right away if you are not doing well or get worse.   This information is not intended to replace advice given to you by your health care provider. Make sure you discuss any questions you have with your health care provider.   Document Released: 05/10/2005 Document Revised: 05/31/2014 Document Reviewed: 12/10/2014 Elsevier Interactive Patient Education [employer].

## 2015-06-25 ENCOUNTER — Encounter: Payer: Self-pay | Admitting: Internal Medicine

## 2015-06-25 ENCOUNTER — Ambulatory Visit (INDEPENDENT_AMBULATORY_CARE_PROVIDER_SITE_OTHER): Payer: BLUE CROSS/BLUE SHIELD | Admitting: Internal Medicine

## 2015-06-25 VITALS — BP 123/65 | HR 76 | Temp 98.1°F | Wt 166.4 lb

## 2015-06-25 DIAGNOSIS — T50905A Adverse effect of unspecified drugs, medicaments and biological substances, initial encounter: Secondary | ICD-10-CM | POA: Insufficient documentation

## 2015-06-25 DIAGNOSIS — T887XXD Unspecified adverse effect of drug or medicament, subsequent encounter: Secondary | ICD-10-CM | POA: Diagnosis not present

## 2015-06-25 DIAGNOSIS — T50905D Adverse effect of unspecified drugs, medicaments and biological substances, subsequent encounter: Secondary | ICD-10-CM

## 2015-06-25 NOTE — Progress Notes (Signed)
   Subjective:    [name] - 35 y.o. female MRN [id number]  Date of birth: 06/07/1980  HPI  RITAMAE Church is here for follow up from urgent care visit for hives. Patient had ankle surgery on 1/11 and was prescribed oxycodone for pain. Patient reports that she was taking oxycodone ER 10 mg q12 hours daily. She started to have itching and hives. Called surgery who instructed her to continue to take oxycodone and to just add benadryl. Patient stopped taking oxycodone briefly after cast change improved pain. However, last week had new cast put on and began to have pain again. Started taking oxycodone again and broke out in hives with extreme pruritis. Went to Urgent Care on 1/29 and was prescribed prednisone and instructed to d/c oxycodone. Patient reports today that skin is much improved but she is still having itching requiring benadryl. Denies fever, nausea, and vomiting.      Review of Systems: Per HPI.  Objective:   Physical Exam BP 123/65 mmHg  Pulse 76  Temp(Src) 98.1 F (36.7 C) (Oral)  Wt 166 lb 6.4 oz (75.479 kg)  LMP 06/24/2015 (Exact Date) Gen: NAD, alert, cooperative with exam, well-appearing Skin: Scant flat topped wheals present on arms bilaterally. No excoriations present. No facial swelling.  Psych: Alert and oriented. Anxious appearing.         Assessment & Plan:   Medication reaction Suspect pruritis and hives 2/2 to medication reaction from oxycodone from prolonged use despite reaction. Patient showed me pictures from day of Urgent Care visit and skin/swelling much improved. She is not taking any other medications that would cause angioedema. Instructed patient to continue taking Benadryl as needed and to apply hydrocortisone to skin. Avoid hot showers, wear loose clothing, and do not apply fragrant body products.      Phill Myron, D.O. 06/25/2015, 4:33 PM PGY-1, [place]

## 2015-06-25 NOTE — Patient Instructions (Signed)
Keep taking the prednisone and the benadryl as needed. Take Care, Dr. Juleen China

## 2015-06-25 NOTE — Assessment & Plan Note (Signed)
Suspect pruritis and hives 2/2 to medication reaction from oxycodone from prolonged use despite reaction. Patient showed me pictures from day of Urgent Care visit and skin/swelling much improved. She is not taking any other medications that would cause angioedema. Instructed patient to continue taking Benadryl as needed and to apply hydrocortisone to skin. Avoid hot showers, wear loose clothing, and do not apply fragrant body products.

## 2016-01-19 ENCOUNTER — Ambulatory Visit (INDEPENDENT_AMBULATORY_CARE_PROVIDER_SITE_OTHER): Payer: Self-pay | Admitting: Internal Medicine

## 2016-01-19 ENCOUNTER — Encounter: Payer: Self-pay | Admitting: Internal Medicine

## 2016-01-19 VITALS — BP 113/79 | Temp 97.6°F | Wt 169.0 lb

## 2016-01-19 DIAGNOSIS — H93A1 Pulsatile tinnitus, right ear: Secondary | ICD-10-CM

## 2016-01-19 MED ORDER — SERTRALINE HCL 50 MG PO TABS: 50.0000 mg | ORAL_TABLET | Freq: Every day | ORAL | 3 refills | Status: DC

## 2016-01-19 NOTE — Progress Notes (Signed)
   Subjective:    Patient ID: Arletha Pili, female    DOB: 06-14-1980, 35 y.o.   MRN: [id number]  HPI  Patient presents for same day appt for pulsatile tinnitus of R ear.  Pulsatile tinnitus of R ear Problem ongoing for over a year. Reports worsened slightly in past three weeks. Experiences a pulsatile noise "like listening to a baby's heartbeat on an ultrasound" in R ear. When loudest, interferes with hearing. Worsens with agitation/anxiety. Has been previously evaluated for this both at our office and by ENT. Was offered TCA at last appt, but declined because the holidays were coming up and she wanted to be able to drink wine. No issues with balance. Occasional headaches that she thinks may be related.   Review of Systems See HPI.     Objective:   Physical Exam  Constitutional: She is oriented to person, place, and time. She appears well-developed and well-nourished. No distress.  HENT:  Head: Normocephalic and atraumatic.  Right Ear: External ear normal.  Left Ear: External ear normal.  R TM without erythema or bulging  Eyes: EOM are normal.  Neck: Normal range of motion. Neck supple. No JVD present. No tracheal deviation present.  Cardiovascular: Normal rate, regular rhythm and normal heart sounds.   No murmur heard. No carotid bruits or other vascular abnormalities noted on R neck  Neurological: She is alert and oriented to person, place, and time.  Skin: She is not diaphoretic.  Psychiatric: She has a normal mood and affect. Her behavior is normal.      Assessment & Plan:  Pulsatile tinnitus of right ear Stable. Question of physiologic vs psychologic etiology. No abnormalities on physical exam. No bruits noted on R. Previously seen by ENT; tympanogram normal and diagnosed with chronic eustachian salpingitis and TMJ arthralgia. Amenable to beginning SSRI today. Explained that sertraline may not improve tinnitus, but patient wishing to begin trial regardless. May also treat  anxiety/hypochondriasis which could be underlying cause. As symptoms have persisted, patient requesting second ENT referral. Explained that they are unlikely to do anything different at this appointment, but agree that a second opinion may help alleviate patient's concerns.  - Referral to ENT placed - Begin sertraline 50mg  qd - Recommended noise cancellation techniques, such as white noise machine or soft music in background  Adin Hector, MD, MPH PGY-2 [facility] Pager [phone]

## 2016-01-19 NOTE — Patient Instructions (Addendum)
It was nice meeting you today Ms. Starkel.  Please begin taking sertraline (Zoloft) once a day. This may help with the noise in your ears.   You can also try using a white noise machine or playing music in the background when the noise is bothering you. This may help "cancel out" the noise.   I have placed a referral to the ENT (Ear, Nose, and Throat doctor) for you. They will call you with the date and time of the appointment.   If you have any questions or concerns, please feel free to call the clinic.   Be well,  Dr. Avon Gully

## 2016-01-19 NOTE — Assessment & Plan Note (Signed)
Stable. Question of physiologic vs psychologic etiology. No abnormalities on physical exam. No bruits noted on R. Previously seen by ENT; tympanogram normal and diagnosed with chronic eustachian salpingitis and TMJ arthralgia. Amenable to beginning SSRI today. Explained that sertraline may not improve tinnitus, but patient wishing to begin trial regardless. May also treat anxiety/hypochondriasis which could be underlying cause. As symptoms have persisted, patient requesting second ENT referral. Explained that they are unlikely to do anything different at this appointment, but agree that a second opinion may help alleviate patient's concerns.  - Referral to ENT placed - Begin sertraline 50mg  qd - Recommended noise cancellation techniques, such as white noise machine or soft music in background

## 2016-04-20 ENCOUNTER — Other Ambulatory Visit: Payer: Self-pay | Admitting: Family Medicine

## 2016-04-20 DIAGNOSIS — E119 Type 2 diabetes mellitus without complications: Secondary | ICD-10-CM

## 2016-08-10 ENCOUNTER — Encounter: Payer: Self-pay | Admitting: Family Medicine

## 2016-08-10 ENCOUNTER — Ambulatory Visit (INDEPENDENT_AMBULATORY_CARE_PROVIDER_SITE_OTHER): Payer: Self-pay | Admitting: Family Medicine

## 2016-08-10 VITALS — BP 116/82 | HR 81 | Temp 97.9°F | Wt 168.4 lb

## 2016-08-10 DIAGNOSIS — N6012 Diffuse cystic mastopathy of left breast: Secondary | ICD-10-CM

## 2016-08-10 NOTE — Progress Notes (Signed)
Date of Visit: 08/10/2016   HPI: Patient presents for a same day appointment to discuss left breast nodule. Patient reports for the past two months she has been palpating nodules on the lateral aspect of her left breast. They have been more prominent during her menstrual cycle. Patient has no tenderness associated with them. She also feels her breast is heavier during that period. Nodules seem to regress after her cycle but still palpable. Patient was concerned because her mother has a history of breast cysts that were removed. Patient denies any drainage, skin changes or pain.  ROS: See HPI  [place]:  Past Medical History:  Diagnosis Date  . Allergy   . Anxiety   . Complication of anesthesia    Problem waking up once and a headache  . Depression   . Diabetes type 2, controlled ([place]) 04/02/2014  . Family history of anesthesia complication    Mother had problem waking up  . Heart murmur   . Hx gestational diabetes   . Spinal headache    , Past Surgical History:  Procedure Laterality Date  . CESAREAN SECTION    . CHOLECYSTECTOMY N/A 09/06/2013   Procedure: LAPAROSCOPIC CHOLECYSTECTOMY;  Surgeon: Harl Bowie, MD;  Location: [facility];  Service: General;  Laterality: N/A;  . NASAL SINUS SURGERY  2003  . TUBAL LIGATION      PHYSICAL EXAM: BP 116/82   Pulse 81   Temp 97.9 F (36.6 C) (Oral)   Wt 168 lb 6.4 oz (76.4 kg)   LMP 07/21/2016 (Approximate)   SpO2 97%   BMI 30.80 kg/m   Physical Exam  Constitutional: She is oriented to person, place, and time. She appears well-developed.  HENT:  Head: Normocephalic and atraumatic.  Eyes: Pupils are equal, round, and reactive to light.  Neck: Normal range of motion. Neck supple.  Cardiovascular: Normal rate and normal heart sounds.   Pulmonary/Chest: Effort normal and breath sounds normal. Left breast exhibits inverted nipple. Left breast exhibits no nipple discharge, no skin change and no tenderness.    Abdominal: Soft.    Musculoskeletal: Normal range of motion.  Neurological: She is alert and oriented to person, place, and time.  Skin: Skin is warm and dry.  Psychiatric: She has a normal mood and affect. Her behavior is normal.    ASSESSMENT/PLAN:  1. Nodular changes, chronic Patient was concerned about nodular changes palpated on left breast around her menstrual cycle. Physical exam was negative for any solid mass or breast changes that would be concerning for malignancy. Axillary lymph node exam was benign. Given cyclical nature of findings, and normal exam most likely fibrocystic breast changes. Not concerning at the moment. --Will continue to monitor, if symptoms persist and start being worrisome could order ultrasound for further characterization --Patient will follow up with PCP in 4-8 weeks   Marjie Skiff, MD [place]

## 2016-08-10 NOTE — Patient Instructions (Signed)
It was great seeing you today! We have addressed the following issues today  1. Nodule palpated in your left breast are most likely fibrocystic change that are more noticeable around your period. We will keep monitoring for 4-8 weeks and have you follow up with your PCP. If it keeps bothering you we can consider an ultrasound  If we did any lab work today, and the results require attention, either me or my nurse will get in touch with you. If everything is normal, you will get a letter in mail and a message via . If you don't hear from [place] in two weeks, please give [place] a call. Otherwise, we look forward to seeing you again at your next visit. If you have any questions or concerns before then, please call the clinic at [phone].  Please bring all your medications to every doctors visit  Sign up for My Chart to have easy access to your labs results, and communication with your Primary care physician.    Please check-out at the front desk before leaving the clinic.    Take Care,   Dr. Andy Gauss   Fibrocystic Breast Changes Fibrocystic breast changes are changes in breast tissue that can cause breasts to become swollen, lumpy, or painful. This can happen due to buildup of scar-like tissue (fibrous tissue) or the forming of fluid-filled lumps (cysts) in the breast. This is a common condition, and it is not cancerous (is benign). The exact cause is not known, but it seems to occur when women go through hormonal changes during their menstrual cycle. Fibrocystic breast changes can affect one or both breasts. What are the causes? The exact cause of fibrocystic breast changes is not known. However, this condition:  May be related to the female hormones estrogen and progesterone.  May be influenced by family traits that get passed from parent to child (genetics). What are the signs or symptoms? Symptoms of this condition may affect one or both breasts, and may include:  Tenderness, mild  discomfort, or pain.  Swelling.  Rope-like tissue that can be felt when touching the breast.  Lumps in one or both breasts.  Changes in breast size. Breasts may get larger before the menstrual period and smaller after the menstrual period.  Green or dark brown discharge from the nipple. Symptoms are usually worse before menstrual periods start, and they get better toward the end of menstrual periods. How is this diagnosed? This condition is diagnosed based on your medical history and a physical exam of your breasts. You may also have tests, such as:  A breast X-ray (mammogram).  Ultrasound of your breasts.  MRI.  Removal of a breast tissue sample for testing (breast biopsy). This may be done if your health care provider thinks that something else may be causing changes in your breasts. How is this treated? Often, treatment is not needed for this condition. In some cases, treatment may include:  Taking over-the-counter pain relievers to help lessen pain or discomfort.  Limiting or avoiding caffeine. Foods and beverages that contain caffeine include chocolate, soda, coffee, and tea.  Reducing sugar and fat in your diet. Your health care provider may also recommend:  A procedure to remove fluid from a cyst that is causing pain (fine needle aspiration).  Surgery to remove a cyst that is large or tender or does not go away. Follow these instructions at home:  Examine your breasts after every menstrual period. If you do not have menstrual periods, check your breasts  on the first day of every month. Feel for changes in your breasts, such as:  More tenderness.  A new growth.  A change in size.  A change in an existing lump.  Take over-the-counter and prescription medicines only as told by your health care provider.  Wear a well-fitted support or sports bra, especially when exercising.  Decrease or avoid caffeine, fat, and sugar in your diet as directed by your health care  provider. Contact a health care provider if:  You have fluid leaking from your nipple, especially if it is bloody.  You have new lumps or bumps in your breast.  Your breast becomes enlarged, red, and painful.  You have areas of your breast that pucker inward.  Your nipple appears flat or indented. Get help right away if:  You have redness of your breast and the redness is spreading. Summary  Fibrocystic breast changes are changes in breast tissue that can cause breasts to become swollen, lumpy, or painful.  This condition may be related to the female hormones estrogen and progesterone.  With this condition, it is important to examine your breasts after every menstrual period. If you do not have menstrual periods, check your breasts on the first day of every month. This information is not intended to replace advice given to you by your health care provider. Make sure you discuss any questions you have with your health care provider. Document Released: 02/24/2006 Document Revised: 01/20/2016 Document Reviewed: 01/07/2016 Elsevier Interactive Patient Education  2017 [employer].

## 2016-09-10 ENCOUNTER — Encounter: Payer: Self-pay | Admitting: Student

## 2016-09-13 ENCOUNTER — Ambulatory Visit (INDEPENDENT_AMBULATORY_CARE_PROVIDER_SITE_OTHER): Payer: Self-pay | Admitting: Family Medicine

## 2016-09-13 ENCOUNTER — Encounter: Payer: Self-pay | Admitting: Family Medicine

## 2016-09-13 VITALS — BP 111/70 | HR 73 | Temp 98.3°F | Ht 63.0 in | Wt 170.4 lb

## 2016-09-13 DIAGNOSIS — R51 Headache: Secondary | ICD-10-CM

## 2016-09-13 DIAGNOSIS — H93A1 Pulsatile tinnitus, right ear: Secondary | ICD-10-CM

## 2016-09-13 DIAGNOSIS — R519 Headache, unspecified: Secondary | ICD-10-CM

## 2016-09-13 MED ORDER — AZITHROMYCIN 250 MG PO TABS: ORAL_TABLET | ORAL | 0 refills | Status: DC

## 2016-09-13 NOTE — Patient Instructions (Signed)
I have ordered imaging of your brain.  Start the antibiotics I prescribed.  If you note worsening symptoms, please go to the emergency room as soon as possible.

## 2016-09-13 NOTE — Progress Notes (Signed)
Subjective: CC: right ear pain HPI: Patient is a 36 y.o. female with presenting to clinic today for a same day appt for R ear pain and headaches.  Headaches: patient notes her headache started on Tuesday (1 week ago ). Headaches is only on the R side, she notes if you draw a line down her face, the pain would only be on the R side. Headaches is "like a toothache." severe 10/10.  The pulsatile tinnitus she's had for some time has worsened. She notes it makes the headaches worse.  She couldn't touch the ear on Friday due to pain.  It still painful intermittently to touch her face. No rash.  On Saturday and Sunday she started having nausea and vomiting, wasn't sure if this was bc of the headache.  She has blurred vision, especially in the R eye for 4 months but it's stable.   She started having diarrhea yesterday.   Her headache and blurred vision are similar to when she had a pituitary adenoma, however she denies nipple discharge which she had previously.   No fevers, chills, neck pain, oral pain. She had injury to her ankle recently so she's feels her gait may be a littler alerted, but denies imbalance, dizziness.  She hasn't tried anything for her symptoms  Social History: never smoker.    ROS: All other systems reviewed and are negative.  Past Medical History Patient Active Problem List   Diagnosis Date Noted  . Medication reaction 06/25/2015  . Pain of left calf 05/23/2015  . 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 Headache 12/11/2014  . Pulsatile tinnitus of right ear 10/29/2014  . Hypochondriasis 08/19/2014  . Cystic thyroid nodule 07/03/2014  . Menorrhagia 07/03/2014  . Thyroid fullness 04/02/2014  . Health care maintenance 04/02/2014  . Prediabetes 04/02/2014  . Seasonal allergies 10/24/2012  . Multiple food allergies 10/24/2012  . Obesity, unspecified 07/23/2012  . Vertigo 12/17/2011  . Fatty liver disease, nonalcoholic 57/32/2025  . ANXIETY 12/31/2008  .  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 aspirin 325 MG EC tablet Take 325 mg by mouth daily.    Marland Kitchen azithromycin (ZITHROMAX) 250 MG tablet Take 500mg  today, then 250mg  daily for the next 4 days 6 each 0  . metFORMIN (GLUCOPHAGE) 500 MG tablet Take 1 tablet (500 mg total) by mouth 2 (two) times daily with a meal. (Patient not taking: Reported on 01/19/2016) 180 tablet 3  . sertraline (ZOLOFT) 50 MG tablet Take 1 tablet (50 mg total) by mouth daily. 30 tablet 3   No current facility-administered medications for this visit.     Objective: Office vital signs reviewed. BP 111/70 (BP Location: Left Arm, Patient Position: Sitting, Cuff Size: Normal)   Pulse 73   Temp 98.3 F (36.8 C) (Oral)   Ht 5\' 3"  (1.6 m)   Wt 170 lb 6.4 oz (77.3 kg)   LMP 08/15/2016   SpO2 99%   BMI 30.19 kg/m    Physical Examination:  General: Awake, alert, well- nourished, NAD ENMT: R TM with some erythema and bulging noted. L TM intact, normal light reflex, no erythema, no bulging. Nasal turbinates moist. MMM, Oropharynx clear without erythema or tonsillar exudate/hypertrophy Eyes: Conjunctiva non-injected. PERRL. Normal fundoscopic exam without papilledema.  Cardio: RRR, no m/r/g noted.  Pulm: No increased WOB.  CTAB, without wheezes, rhonchi or crackles noted.  A&O x4. Speech clear. EOMI,  Uvula and tongue midline. Facial movements symmetric. 5/5 strength in the upper extremities and lower extremities bilaterally. Sensation intact bilaterally. Normal DTRs in biceps and patella. Gait normal.   Assessment/Plan: Pulsatile tinnitus of right ear Patient referred back to ENT but states she was going to see the same provider who told it was secondary to TMJ arthralgia therefore she canceled. Seems to be worsening.  BP at goal. Will treat for concerns for AOM, however I do not think this is the  etiology. - azithromycin give pt PCN allergic  - may need referral to neurology pending MRI.  Headache Most likely due to anxiety/hypochondriasis, however concerns for other reported compliant's such as blurred vision given her h/o pituitary adenoma. No neurologic deficits noted on my exam. Pt has not followed up with a neurologist in a very long time. Last MRI in 2015 revealed no pituitary macroadenoma or enlarging lesion, but a subtle and debatable 3 mm focus of slightly diminished enhancement within the left side of the gland where a previous microadenoma was located which could represent scarring or minimal residual microadenoma. History is also consistent with a migraine.  - NSAIDs as needed - repeat MRI - may need referral back to neurology  - give the pts history of hypochondriasis, I believe she'd benefit from regular, scheduled visits with her PCP.   Discussed with attending, Dr. Ree Kida.  Orders Placed This Encounter  Procedures  . MR Brain W Wo Contrast    Standing Status:   Future    Standing Expiration Date:   11/13/2017    Order Specific Question:   If indicated for the ordered procedure, I authorize the administration of contrast media per Radiology protocol    Answer:   Yes    Order Specific Question:   Reason for Exam (SYMPTOM  OR DIAGNOSIS REQUIRED)    Answer:   headahce, h/o pituitary adenoma    Order Specific Question:   What is the patient's sedation requirement?    Answer:   No Sedation    Order Specific Question:   Does the patient have a pacemaker or implanted devices?    Answer:   No    Order Specific Question:   Preferred imaging location?    Answer:   [facility] (table limit-500 lbs)    Order Specific Question:   Radiology Contrast Protocol - do NOT remove file path    Answer:   [email or web address]    Meds ordered this encounter  Medications  . azithromycin (ZITHROMAX) 250 MG tablet    Sig: Take 500mg  today, then 250mg   daily for the next 4 days    Dispense:  6 each    Refill:  [facility] PGY-3, [place]

## 2016-09-14 NOTE — Assessment & Plan Note (Addendum)
Most likely due to anxiety/hypochondriasis, however concerns for other reported compliant's such as blurred vision given her h/o pituitary adenoma. No neurologic deficits noted on my exam. Pt has not followed up with a neurologist in a very long time. Last MRI in 2015 revealed no pituitary macroadenoma or enlarging lesion, but a subtle and debatable 3 mm focus of slightly diminished enhancement within the left side of the gland where a previous microadenoma was located which could represent scarring or minimal residual microadenoma. History is also consistent with a migraine.  - NSAIDs as needed - repeat MRI - may need referral back to neurology  - give the pts history of hypochondriasis, I believe she'd benefit from regular, scheduled visits with her PCP.

## 2016-09-14 NOTE — Assessment & Plan Note (Signed)
Patient referred back to ENT but states she was going to see the same provider who told it was secondary to TMJ arthralgia therefore she canceled. Seems to be worsening.  BP at goal. Will treat for concerns for AOM, however I do not think this is the etiology. - azithromycin give pt PCN allergic  - may need referral to neurology pending MRI.

## 2016-09-22 ENCOUNTER — Ambulatory Visit (HOSPITAL_COMMUNITY): Admission: RE | Admit: 2016-09-22 | Payer: Self-pay | Source: Ambulatory Visit

## 2016-09-27 ENCOUNTER — Ambulatory Visit (HOSPITAL_COMMUNITY)
Admission: RE | Admit: 2016-09-27 | Discharge: 2016-09-27 | Disposition: A | Discharge status: Home / Self Care | Payer: Self-pay | Source: Ambulatory Visit | Attending: Family Medicine | Admitting: Family Medicine

## 2016-09-27 DIAGNOSIS — Z8669 Personal history of other diseases of the nervous system and sense organs: Secondary | ICD-10-CM | POA: Insufficient documentation

## 2016-09-27 DIAGNOSIS — R519 Headache, unspecified: Secondary | ICD-10-CM

## 2016-09-27 DIAGNOSIS — R51 Headache: Secondary | ICD-10-CM | POA: Insufficient documentation

## 2016-09-27 LAB — CREATININE, SERUM
Creatinine, Ser: 0.87 mg/dL (ref 0.44–1.00)
GFR calc Af Amer: >60 mL/min (ref >60)
GFR calc non Af Amer: >60 mL/min (ref >60)

## 2016-09-27 MED ORDER — GADOBENATE DIMEGLUMINE 529 MG/ML IV SOLN
14.0000 mL | Freq: Once | INTRAVENOUS | Status: AC | PRN
  Administered 2016-09-27: 14 mL via INTRAVENOUS

## 2016-09-28 ENCOUNTER — Telehealth: Payer: Self-pay | Admitting: Family Medicine

## 2016-09-28 ENCOUNTER — Telehealth: Payer: Self-pay | Admitting: Student

## 2016-09-28 NOTE — Telephone Encounter (Signed)
Would like results from yesterdays MRI

## 2016-09-28 NOTE — Telephone Encounter (Signed)
Please call and let the pt know that the MRI of her brain was normal.  If she's continuing to have headaches, please have her f/u with her PCP.  Thanks, Archie Patten, MD [facility] Resident  09/28/2016, 11:33 AM

## 2016-09-29 NOTE — Telephone Encounter (Signed)
Pt informed. Deseree Blount, CMA  

## 2016-09-30 NOTE — Telephone Encounter (Signed)
Forwarding the message to MD who saw her last and ordered the MRI. Thanks! Bretta Bang

## 2016-10-01 NOTE — Telephone Encounter (Signed)
The patient was informed by my CMA on 5/9; it seems like this request was 3 days ago.  Thanks. Archie Patten, MD [facility] Resident  10/01/2016, 9:57 AM

## 2016-10-22 ENCOUNTER — Ambulatory Visit: Payer: Self-pay | Admitting: Student

## 2016-10-25 ENCOUNTER — Encounter: Payer: Self-pay | Admitting: Student

## 2017-01-26 ENCOUNTER — Encounter: Payer: Self-pay | Admitting: Student

## 2017-01-26 ENCOUNTER — Other Ambulatory Visit: Payer: Self-pay | Admitting: Student

## 2017-01-26 ENCOUNTER — Ambulatory Visit (INDEPENDENT_AMBULATORY_CARE_PROVIDER_SITE_OTHER): Payer: Self-pay | Admitting: Student

## 2017-01-26 VITALS — BP 118/82 | HR 66 | Temp 97.9°F | Ht 63.0 in | Wt 170.0 lb

## 2017-01-26 DIAGNOSIS — E041 Nontoxic single thyroid nodule: Secondary | ICD-10-CM

## 2017-01-26 DIAGNOSIS — Z23 Encounter for immunization: Secondary | ICD-10-CM

## 2017-01-26 DIAGNOSIS — E0789 Other specified disorders of thyroid: Secondary | ICD-10-CM

## 2017-01-26 DIAGNOSIS — H93A9 Pulsatile tinnitus, unspecified ear: Secondary | ICD-10-CM

## 2017-01-26 DIAGNOSIS — K760 Fatty (change of) liver, not elsewhere classified: Secondary | ICD-10-CM

## 2017-01-26 DIAGNOSIS — R6882 Decreased libido: Secondary | ICD-10-CM

## 2017-01-26 DIAGNOSIS — H93A1 Pulsatile tinnitus, right ear: Secondary | ICD-10-CM

## 2017-01-26 DIAGNOSIS — J309 Allergic rhinitis, unspecified: Secondary | ICD-10-CM

## 2017-01-26 MED ORDER — FLUTICASONE PROPIONATE 50 MCG/ACT NA SUSP: 2.0000 | Freq: Every day | NASAL | 6 refills | Status: DC

## 2017-01-26 NOTE — Assessment & Plan Note (Signed)
This is a chronic issue. She was seen by ENT in the past. No clear etiology yet. Ear exam within normal limits. She has no carotid bruits. Recent MRI brain with no significant new finding to explain this. Has history of anxiety and hypochondriasis which could be contributing. She was once prescribed Zoloft but didn't try it.  - VAS US CAROTID to rule out vascular issues - We will try Flonase for possible eustachian tube dysfunction/allergy

## 2017-01-26 NOTE — Addendum Note (Signed)
Addended by: [place] Pink on: 01/26/2017 04:03 PM   Modules accepted: Orders

## 2017-01-26 NOTE — Assessment & Plan Note (Signed)
She also reports some vaginal dryness. She has regular period - Follicle stimulating hormone - Luteinizing hormone - FSH/LH - Testosterone, Free, Total, SHBG

## 2017-01-26 NOTE — Progress Notes (Signed)
Subjective:   Chief Complaint  Patient presents with  . Annual Exam   HPI [name] is a 36 y.o. old female here  for annual exam.  Concern today: pulsatile tinnitus, occasional hoarseness of voice.  Changes in his/her health in the last 12 months: no Occupation: Scientist, water quality at [employer] Wears seatbelt: yes.    The patient has regular exercise: no. Not able to do walking or running due to ankle surgery. Planning to sign up for swimming at [employer] vegetables and fruits: no. A little  vegetables Smokes cigarette: no Drinks EtOH: occasional Drug use: no Ever been transfused or tattooed?: no.  The patient is sexually active.  Patient uses birth control: BTL.  Domestic violence: no.  Advance directive: no. MOST: no.   History of depression:yes.  Patient dental home: yes.   Immunizations  Needs influenza vaccine: yes.  Needs HPV (Women until age 62): not applicable.  Needs Shingrix (all >110yrs of age): not applicable.  Needs Tdap: no.  Needs Pneumococcal: no.  Screening Need colon cancer screening: not applicable. Need breast cancer ccreening: not applicable. Need cervical cancer Screening: no. STOP BANG >/=3 for OSA: no. Need lung cancer screening:no. At risk for skin cancer: no. Need HCV Screening: no. Need STI Screening: no.  PMH/Problem List: has CONDYLOMA ACUMINATUM; PITUITARY ADENOMA; ANXIETY; Fatty liver disease, nonalcoholic; Vertigo; Obesity, unspecified; Seasonal allergies; Multiple food allergies; Thyroid fullness; Health care maintenance; Prediabetes; Cystic thyroid nodule; Menorrhagia; Hypochondriasis; Pulsatile tinnitus of right ear; Headache; and Coalition, calcaneal tarsal on her problem list.   has a past medical history of Allergy; Anxiety; Complication of anesthesia; Depression; Diabetes type 2, controlled ([place]) (04/02/2014); Family history of anesthesia complication; Heart murmur; gestational diabetes; and Spinal headache.  [facility]  Family History    Problem Relation Age of Onset  . Hypertension Mother   . Hyperlipidemia Mother   . Diabetes Other    Family history of heart disease before age of 53 yrs: no. Family history of stroke: no. Family history of cancer: mother with cervical cancer  SH Social History  Substance Use Topics  . Smoking status: Never Smoker  . Smokeless tobacco: Never Used  . Alcohol use No     Comment: hasn't had a drink since 07/07/2013    Review of Systems  Constitutional: Positive for fatigue. Negative for appetite change and fever.  HENT: Negative for trouble swallowing.   Eyes: Negative for visual disturbance.  Respiratory: Negative for shortness of breath.   Cardiovascular: Negative for chest pain and palpitations.  Gastrointestinal: Positive for abdominal pain. Negative for blood in stool, diarrhea and vomiting.       ?IBS  Endocrine: Positive for heat intolerance. Negative for cold intolerance.  Genitourinary: Positive for dyspareunia. Negative for dysuria, hematuria, menstrual problem, vaginal bleeding and vaginal discharge.  Skin: Negative for rash.  Neurological: Negative for dizziness, light-headedness and headaches.  Hematological: Negative for adenopathy. Does not bruise/bleed easily.  Psychiatric/Behavioral: Negative for dysphoric mood and suicidal ideas. The patient is nervous/anxious.        Mood "okay"       Objective:   Physical Exam Vitals:   01/26/17 0831  BP: 118/82  Pulse: 66  Temp: 97.9 F (36.6 C)  TempSrc: Oral  SpO2: 98%  Weight: 170 lb (77.1 kg)  Height: 5\' 3"  (1.6 m)   Body mass index is 30.11 kg/m.  GEN: appears well, no apparent distress. Head: normocephalic and atraumatic  Eyes: conjunctiva without injection, sclera anicteric Ears: external ear and ear canal  normal Nares: no rhinorrhea, congestion or erythema Oropharynx: mmm without erythema or exudation HEM: negative for cervical or periauricular lymphadenopathies CVS: RRR, nl s1 & s2, no murmurs, no  edema,  no carotid bruits,  2+ DP pulses bilaterally RESP: no IWOB, good air movement bilaterally, CTAB GI: BS present & normal, soft, NTND GU: no suprapubic or CVA tenderness MSK: no focal tenderness or notable swelling SKIN: no apparent skin lesion ENDO: negative thyromegally NEURO: alert and oiented appropriately, no gross deficits  PSYCH: euthymic mood with congruent affect    Assessment & Plan:  1. Cystic thyroid nodule: No thyromegaly on exam. She reports some heat intolerance but no tachycardia.  -Will check TSH and Free T4  2. Fatty liver disease, nonalcoholic: history of this in the past. Admits to drinking alcohol occasionally.  -Recommended lifestyle changes including diet and exercise to lose weight  3. Allergic rhinitis, unspecified seasonality, unspecified trigger - fluticasone (FLONASE) 50 MCG/ACT nasal spray; Place 2 sprays into both nostrils daily.  Dispense: 16 g; Refill: 6  4. 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 She was seen by ENT in the past. No clear etiology yet. Ear exam within normal limits. She has no carotid bruits. Recent MRI brain with no significant new finding to explain this. Has history of anxiety and hypochondriasis which could be contributing. She was once prescribed Zoloft but didn't try it.  - VAS US CAROTID to rule out vascular issues - We will try Flonase for possible eustachian tube dysfunction/allergy   5. Decreased libido: She also reports some vaginal dryness. She has regular period which makes ovarian insufficiency less likely. - Recommended lubricating creams for vaginal dryness - Follicle stimulating hormone - Luteinizing hormone - FSH/LH - Testosterone, Free, Total, SHBG  6. Need for immunization against influenza - Flu Vaccine QUAD 36+ mos IM  Wendee Beavers PGY-3 Pager [phone] 01/26/17  2:40 PM

## 2017-01-26 NOTE — Patient Instructions (Signed)
It was great seeing you today! We have addressed the following issues today 1. Ringing in the your ear: we are getting an ultrasound of the blood vessels in the neck to make sure that this is not related to your blood vessels. We have also sent a prescription for Flonase nasal spray.  2.   Sexual issues: we are doing some blood tests today.  3.   Concern about your glucose level: we are checking the blood for this today. Meanwhile, I strongly recommend lifestyle changes including diet and exercise. I'm glad you have told about starting a swimming class at [facility]. Please see below for more on diet and exercise.  If we did any lab work today, and the results require attention, either me or my nurse will get in touch with you. If everything is normal, you will get a letter in mail and a message via . If you don't hear from [place] in two weeks, please give [place] a call. Otherwise, we look forward to seeing you again at your next visit. If you have any questions or concerns before then, please call the clinic at [phone].  Please bring all your medications to every doctors visit  Sign up for My Chart to have easy access to your labs results, and communication with your Primary care physician.    Please check-out at the front desk before leaving the clinic.    Take Care,   Dr. Cyndia Skeeters  Portion Size    Choose healthier foods such as 100% whole grains, vegetables, fruits, beans, nut seeds, olive oil, most vegetable oils, fat-free dietary, wild game and fish.   Avoid sweet tea, other sweetened beverages, soda, fruit juice, cold cereal and milk and trans fat.   Eat at least 3 meals and 1-2 snacks per day.  Aim for no more than 5 hours between eating.  Eat breakfast within one hour of getting up.    Exercise at least 150 minutes per week, including weight resistance exercises 3 or 4 times per week.   Try to lose at least 7-10% of your current body weight.

## 2017-01-27 ENCOUNTER — Encounter: Payer: Self-pay | Admitting: Student

## 2017-01-27 LAB — T4, FREE: FREE T4: 1.01 ng/dL (ref 0.82–1.77)

## 2017-01-27 LAB — TESTOSTERONE, FREE, TOTAL, SHBG
Sex Hormone Binding: 32.3 nmol/L (ref 24.6–122.0)
TESTOSTERONE FREE: 2.7 pg/mL (ref 0.0–4.2)
TESTOSTERONE: 32 ng/dL (ref 8–48)

## 2017-01-27 LAB — FSH/LH
FSH: 2.2 m[IU]/mL
LH: 4.3 m[IU]/mL

## 2017-01-27 LAB — SPECIMEN STATUS REPORT

## 2017-01-28 ENCOUNTER — Ambulatory Visit (HOSPITAL_COMMUNITY)
Admission: RE | Admit: 2017-01-28 | Discharge: 2017-01-28 | Disposition: A | Discharge status: Home / Self Care | Payer: Self-pay | Source: Ambulatory Visit | Attending: Family Medicine | Admitting: Family Medicine

## 2017-01-28 DIAGNOSIS — I6523 Occlusion and stenosis of bilateral carotid arteries: Secondary | ICD-10-CM | POA: Insufficient documentation

## 2017-01-28 DIAGNOSIS — H93A9 Pulsatile tinnitus, unspecified ear: Secondary | ICD-10-CM

## 2017-01-28 NOTE — Progress Notes (Signed)
*  PRELIMINARY RESULTS* Vascular Ultrasound Carotid Duplex (Doppler) has been completed.   Findings suggest 1-39% internal carotid artery stenosis bilaterally. Vertebral arteries are patent with antegrade flow.  01/28/2017 9:33 AM Maudry Mayhew, BS, RVT, RDCS, RDMS

## 2017-01-30 LAB — VAS US CAROTID
LCCADSYS: -90 cm/s
LCCAPDIAS: 30 cm/s
LEFT ECA DIAS: -24 cm/s
LEFT VERTEBRAL DIAS: 15 cm/s
LICADDIAS: -31 cm/s
LICADSYS: -73 cm/s
LICAPDIAS: -25 cm/s
Left CCA dist dias: -34 cm/s
Left CCA prox sys: 130 cm/s
Left ICA prox sys: -103 cm/s
RCCAPSYS: 94 cm/s
RIGHT ECA DIAS: -13 cm/s
RIGHT VERTEBRAL DIAS: 11 cm/s
Right CCA prox dias: 19 cm/s
Right cca dist sys: -88 cm/s

## 2017-02-08 ENCOUNTER — Telehealth: Payer: Self-pay | Admitting: Student

## 2017-02-08 NOTE — Telephone Encounter (Signed)
Pt called because she received the letter that we sent about her thyroid levels. There were no results about her cholesterol, sugars, and other types of blood work. Will that come separately? Did we do a complete lab? Please let patient know. jw

## 2017-02-09 NOTE — Telephone Encounter (Signed)
Called and talked to her. I don't recall talking about her blood sugar when I saw her for physical. The main issue was tinnitus, low sexual drive and heat intolerance for which we did some endocrine work up. She reports feeling sweaty after eating. She has no history of diabetes or prediabetes. Her blood sugar has been good in the past.  I advised her to come back if her symptoms continues to bother her. She seems to be occupied with her chronic tinnitus. She thinks her sugar is contributing to this. However, her tinnitus is chronic, even when her blood sugar is normal. I doubt her blood sugar is contributing to her tinnitus and reassured her. She says she has an apt with ENT again.

## 2017-03-25 ENCOUNTER — Other Ambulatory Visit (INDEPENDENT_AMBULATORY_CARE_PROVIDER_SITE_OTHER): Payer: Self-pay | Admitting: Otolaryngology

## 2017-03-28 ENCOUNTER — Other Ambulatory Visit (INDEPENDENT_AMBULATORY_CARE_PROVIDER_SITE_OTHER): Payer: Self-pay | Admitting: Otolaryngology

## 2017-03-28 DIAGNOSIS — R519 Headache, unspecified: Secondary | ICD-10-CM

## 2017-03-28 DIAGNOSIS — R51 Headache: Principal | ICD-10-CM

## 2017-05-03 ENCOUNTER — Ambulatory Visit: Payer: Self-pay | Admitting: Student

## 2017-08-31 ENCOUNTER — Other Ambulatory Visit (INDEPENDENT_AMBULATORY_CARE_PROVIDER_SITE_OTHER): Payer: Self-pay

## 2017-08-31 DIAGNOSIS — R7303 Prediabetes: Secondary | ICD-10-CM

## 2017-08-31 LAB — GLUCOSE, POCT (MANUAL RESULT ENTRY): POC GLUCOSE: 108 mg/dL — AB (ref 70–99)

## 2017-08-31 LAB — POCT GLYCOSYLATED HEMOGLOBIN (HGB A1C): Hemoglobin A1C: 5.9

## 2017-09-05 ENCOUNTER — Other Ambulatory Visit: Payer: Self-pay

## 2017-09-05 ENCOUNTER — Encounter: Payer: Self-pay | Admitting: Internal Medicine

## 2017-09-05 ENCOUNTER — Ambulatory Visit (INDEPENDENT_AMBULATORY_CARE_PROVIDER_SITE_OTHER): Payer: Self-pay | Admitting: Internal Medicine

## 2017-09-05 VITALS — BP 108/78 | HR 68 | Temp 98.1°F | Ht 63.0 in | Wt 170.0 lb

## 2017-09-05 DIAGNOSIS — R7303 Prediabetes: Secondary | ICD-10-CM

## 2017-09-05 DIAGNOSIS — Z6830 Body mass index (BMI) 30.0-30.9, adult: Secondary | ICD-10-CM

## 2017-09-05 DIAGNOSIS — J309 Allergic rhinitis, unspecified: Secondary | ICD-10-CM

## 2017-09-05 DIAGNOSIS — E669 Obesity, unspecified: Secondary | ICD-10-CM

## 2017-09-05 LAB — GLUCOSE, POCT (MANUAL RESULT ENTRY): POC Glucose: 117 mg/dl — AB (ref 70–99)

## 2017-09-05 MED ORDER — CETIRIZINE HCL 10 MG PO TABS: 10.0000 mg | ORAL_TABLET | Freq: Every day | ORAL | 11 refills | Status: DC

## 2017-09-05 MED ORDER — METFORMIN HCL ER 500 MG PO TB24: 500.0000 mg | ORAL_TABLET | Freq: Every day | ORAL | 3 refills | Status: DC

## 2017-09-05 NOTE — Progress Notes (Signed)
   Subjective:    [name] - 37 y.o. female MRN [id number]  Date of birth: 1980-09-16  HPI  SHANTIL VALLEJO is here for follow up.  Prediabetes: Patient very concerned about her A1c of 5.9% from 4/10 and the symptoms she has been having. Reports that she has felt very shaky with intense need to eat something and then becomes very sleepy after eating carbohydrates. Endorses polyuria and polydipsia. Denies vomiting. Does check CBGs infrequently. Reports CBG 198 recently 2.5 hours postprandial. No recent fasting CBG checks. Requests to restart Metformin as she has taken it in the past and it helped with the type of symptoms she is having now. Is very concerned about getting T2DM as there is a strong family history of this disease.   Allergic Rhinitis: Reports for the past few weeks she has felt congested, throat has felt itchy, ears have felt full. No fevers, cough, chest pain, trouble breathing, or wheezing. She requests refill on her Zyrtec. Has Flonase at home but has not been using it.    -  reports that she has never smoked. She has never used smokeless tobacco. - Review of Systems: Per HPI. - Past Medical History: Patient Active Problem List   Diagnosis Date Noted  . Decreased libido 01/26/2017  . 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 Headache 12/11/2014  . Pulsatile tinnitus of right ear 10/29/2014  . Hypochondriasis 08/19/2014  . Cystic thyroid nodule 07/03/2014  . Menorrhagia 07/03/2014  . Thyroid fullness 04/02/2014  . Health care maintenance 04/02/2014  . Prediabetes 04/02/2014  . Seasonal allergies 10/24/2012  . Multiple food allergies 10/24/2012  . Obesity, unspecified 07/23/2012  . Vertigo 12/17/2011  . Fatty liver disease, nonalcoholic 93/71/6967  . ANXIETY 12/31/2008  . 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 CONDYLOMA ACUMINATUM 07/21/2006   - Medications: reviewed and updated   Objective:   Physical Exam Ht 5\' 3"  (1.6 m)   Wt 170 lb (77.1 kg)   LMP 08/05/2017  (Approximate)   BMI 30.11 kg/m  Gen: NAD, alert, cooperative with exam, well-appearing HEENT: NCAT, PERRL, clear conjunctiva, oropharynx clear, supple neck, TMs normal bilaterally, nasal turbinates edematous and erythematous  CV: RRR, good S1/S2, no murmur, no edema, capillary refill brisk  Resp: CTABL, no wheezes, non-labored    Assessment & Plan:   1. Prediabetes Recent A1c consistent with Pre-diabetes. Patient requests Metformin which she has taken in the past. Some of her symptoms sound more consistent with hypoglycemia but this is unlikely given she does not taken any diabetic medications. Glucose minimally elevated today to 117. Given report of elevated CBGs at home, polyuria and polydipsia believe it is reasonable to resume Metformin especially given no potential side effect of hypoglycemia. No history of renal impairment. Will check BMET and screening lipid profile.  - metFORMIN (GLUCOPHAGE-XR) 500 MG 24 hr tablet; Take 1 tablet (500 mg total) by mouth daily with breakfast.  Dispense: 90 tablet; Refill: 3 - Basic Metabolic Panel - Lipid Panel - Glucose (CBG)  2. Allergic rhinitis, unspecified seasonality, unspecified trigger Symptoms consistent with allergic rhinitis.  - cetirizine (ZYRTEC) 10 MG tablet; Take 1 tablet (10 mg total) by mouth daily.  Dispense: 30 tablet; Refill: 11 -recommended resuming Flonase   3. Class 1 obesity without serious comorbidity with body mass index (BMI) of 30.0 to 30.9 in adult, unspecified obesity type Discussed appropriate diet and exercise regimens.  - Lipid Panel   Phill Myron, D.O. 09/05/2017, 8:51 AM PGY-3, [place]

## 2017-09-05 NOTE — Patient Instructions (Signed)
Thank you for coming in today! Today we talked about"  1. Prediabetes. I have sent Metformin 500 mg daily to your pharmacy. You may notice some GI side effects for the first few days of taking this medication. They typically get better within a week. We will check your basic electrolytes, kidney, sugar level, and cholesterol today.  2. Allergies. I have sent the allergy medicine to your pharmacy. I also recommend starting Flonase.

## 2017-09-06 ENCOUNTER — Telehealth: Payer: Self-pay | Admitting: *Deleted

## 2017-09-06 LAB — LIPID PANEL
Chol/HDL Ratio: 4 ratio (ref 0.0–4.4)
Cholesterol, Total: 160 mg/dL (ref 100–199)
HDL: 40 mg/dL (ref >39)
LDL CALC: 86 mg/dL (ref 0–99)
Triglycerides: 169 mg/dL — ABNORMAL HIGH (ref 0–149)
VLDL CHOLESTEROL CAL: 34 mg/dL (ref 5–40)

## 2017-09-06 LAB — BASIC METABOLIC PANEL
BUN / CREAT RATIO: 18 (ref 9–23)
BUN: 11 mg/dL (ref 6–20)
CO2: 23 mmol/L (ref 20–29)
Calcium: 9.2 mg/dL (ref 8.7–10.2)
Chloride: 103 mmol/L (ref 96–106)
Creatinine, Ser: 0.61 mg/dL (ref 0.57–1.00)
GFR, EST AFRICAN AMERICAN: 134 mL/min/{1.73_m2} (ref >59)
GFR, EST NON AFRICAN AMERICAN: 116 mL/min/{1.73_m2} (ref >59)
Glucose: 105 mg/dL — ABNORMAL HIGH (ref 65–99)
Potassium: 4.9 mmol/L (ref 3.5–5.2)
Sodium: 139 mmol/L (ref 134–144)

## 2017-09-06 NOTE — Telephone Encounter (Signed)
Tried to contact pt to inform her of below and VM was full so unable to LVM asking her to call back.  If she does please give her the below information. Katharina Caper, April D, [place]

## 2017-09-06 NOTE — Telephone Encounter (Signed)
-----   Message from Nicolette Bang, DO sent at 09/06/2017  1:23 PM EDT ----- Please call patient to let her know BMET and lipid panel looked good. I do not have any concerns regarding her lab results.   Phill Myron, D.O. 09/06/2017, 1:23 PM PGY-3, [place]

## 2017-10-06 ENCOUNTER — Other Ambulatory Visit (HOSPITAL_COMMUNITY): Payer: Self-pay | Admitting: *Deleted

## 2017-10-06 ENCOUNTER — Other Ambulatory Visit: Payer: Self-pay

## 2017-10-06 ENCOUNTER — Ambulatory Visit (INDEPENDENT_AMBULATORY_CARE_PROVIDER_SITE_OTHER): Payer: Self-pay | Admitting: Family Medicine

## 2017-10-06 ENCOUNTER — Encounter: Payer: Self-pay | Admitting: Family Medicine

## 2017-10-06 VITALS — BP 124/64 | HR 71 | Temp 98.0°F | Wt 167.0 lb

## 2017-10-06 DIAGNOSIS — N63 Unspecified lump in unspecified breast: Secondary | ICD-10-CM

## 2017-10-06 DIAGNOSIS — N6452 Nipple discharge: Secondary | ICD-10-CM

## 2017-10-06 DIAGNOSIS — N6320 Unspecified lump in the left breast, unspecified quadrant: Secondary | ICD-10-CM

## 2017-10-06 HISTORY — DX: Unspecified lump in unspecified breast: N63.0

## 2017-10-06 NOTE — Patient Instructions (Addendum)
It was nice meeting you today [name]!  Today, we did a breast exam and ordered a diagnostic mammogram to get a thorough picture of your breast.  The BCCCP program will help make the mammogram more affordable for you, and we called and left a message with them.  They should call us back or call you back in the next few days.  They do mammograms for the BCCCP program on Thursdays, so if you do not hear back about an appointment by Wednesday, May 22, you can call them at [phone].  If you have any questions or concerns, please feel free to call the clinic.   Be well,  Dr. Shan Levans  Mammogram A mammogram is an X-ray of the breasts that is done to check for changes that are not normal. This test can screen for and find any changes that may suggest breast cancer. This test can also help to find other changes and variations in the breast. What happens before the procedure?  Have this test done about 1-2 weeks after your period. This is usually when your breasts are the least tender.  If you are visiting a new doctor or clinic, send any past mammogram images to your new doctor's office.  Wash your breasts and under your arms the day of the test.  Do not use deodorants, perfumes, lotions, or powders on the day of the test.  Take off any jewelry from your neck.  Wear clothes that you can change into and out of easily. What happens during the procedure?  You will undress from the waist up. You will put on a gown.  You will stand in front of the X-ray machine.  Each breast will be placed between two plastic or glass plates. The plates will press down on your breast for a few seconds. Try to stay as relaxed as possible. This does not cause any harm to your breasts. Any discomfort you feel will be very brief.  X-rays will be taken from different angles of each breast. The procedure may vary among doctors and hospitals. What happens after the procedure?  The mammogram will be looked at by a  specialist (radiologist).  You may need to do certain parts of the test again. This depends on the quality of the images.  Ask when your test results will be ready. Make sure you get your test results.  You may go back to your normal activities. This information is not intended to replace advice given to you by your health care provider. Make sure you discuss any questions you have with your health care provider. Document Released: 08/06/2008 Document Revised: 10/16/2015 Document Reviewed: 07/19/2014 Elsevier Interactive Patient Education  [employer].

## 2017-10-06 NOTE — Progress Notes (Signed)
   Subjective:    [name] - 37 y.o. female MRN [id number]  Date of birth: 1980-06-02  HPI  [name] is here for hard spots, swelling, and pain on her left breast that is worse when she is on her period.  Her symptoms started about one year ago and have worsened since then.  She saw a provider in November 2018, who reassured her that it is normal for her breasts to change with her menstrual cycle.  However, since then, she has noticed hard areas in the outer area of her left breast even between her periods, but during her periods, the swelling becomes so intense that she cannot move her left arm.  Her left breast has also become larger than her right even between periods.  The swelling is similar to when she was lactating, so she tried to express fluid out of her left nipple, and brown-black fluid came out.  She is concerned about the possibility of cancer because she has two great-aunts on her mother's side who had breast cancer.  Her mother also had benign tumors removed from her breast before she passed away from cervical cancer.  Health Maintenance:  Health Maintenance Due  Topic Date Due  . URINE MICROALBUMIN  08/18/1990    -  reports that she has never smoked. She has never used smokeless tobacco. - Review of Systems: Per HPI. - Past Medical History: Patient Active Problem List   Diagnosis Date Noted  . Breast mass in female 10/06/2017  . Decreased libido 01/26/2017  . 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 Headache 12/11/2014  . Pulsatile tinnitus of right ear 10/29/2014  . Hypochondriasis 08/19/2014  . Cystic thyroid nodule 07/03/2014  . Menorrhagia 07/03/2014  . Thyroid fullness 04/02/2014  . Health care maintenance 04/02/2014  . Prediabetes 04/02/2014  . Seasonal allergies 10/24/2012  . Multiple food allergies 10/24/2012  . Obesity, unspecified 07/23/2012  . Vertigo 12/17/2011  . Fatty liver disease, nonalcoholic 20/94/7096  . ANXIETY 12/31/2008  . PITUITARY  ADENOMA 03/20/2008  . CONDYLOMA ACUMINATUM 07/21/2006   - Medications: reviewed and updated   Objective:   Physical Exam BP 124/64   Pulse 71   Temp 98 F (36.7 C) (Oral)   Wt 167 lb (75.8 kg)   LMP 09/22/2017 (Approximate)   SpO2 99%   BMI 29.58 kg/m  Gen: NAD, alert, cooperative with exam, anxious appearing, tearful HEENT: NCAT, clear conjunctiva, supple neck CV: RRR, good S1/S2, no murmur  Resp: CTABL, no wheezes, non-labored Skin: no rashes, normal turgor  Neuro: no gross deficits.  Psych: good insight, alert and oriented Breast exam: L breast is larger than right, no dimpling is noted on L or R breast, L breast has diffuse irregularities without a palpable mass.  No nipple discharge.       Assessment & Plan:   Breast mass in female Although physical exam was reassuring, patient's description of her nipple discharge seems consistent with bloody fluid, which should be worked up.  Due to the poor sensitivity of breast exams and this patient's history and family history, would be appropriate to obtain a diagnostic mammogram.  Patient does not have insurance, so BCCCP program was contacted to help patient obtain mammogram.    Maia Breslow, M.D. 10/06/2017, 9:51 AM PGY-1, [place]

## 2017-10-06 NOTE — Assessment & Plan Note (Signed)
Although physical exam was reassuring, patient's description of her nipple discharge seems consistent with bloody fluid, which should be worked up.  Due to the poor sensitivity of breast exams and this patient's history and family history, would be appropriate to obtain a diagnostic mammogram.  Patient does not have insurance, so BCCCP program was contacted to help patient obtain mammogram.

## 2017-10-20 ENCOUNTER — Ambulatory Visit
Admission: RE | Admit: 2017-10-20 | Discharge: 2017-10-20 | Disposition: A | Discharge status: Home / Self Care | Payer: No Typology Code available for payment source | Source: Ambulatory Visit | Attending: Obstetrics and Gynecology | Admitting: Obstetrics and Gynecology

## 2017-10-20 ENCOUNTER — Ambulatory Visit (HOSPITAL_COMMUNITY)
Admission: RE | Admit: 2017-10-20 | Discharge: 2017-10-20 | Disposition: A | Discharge status: Home / Self Care | Payer: Self-pay | Source: Ambulatory Visit | Attending: Obstetrics and Gynecology | Admitting: Obstetrics and Gynecology

## 2017-10-20 ENCOUNTER — Encounter (HOSPITAL_COMMUNITY): Payer: Self-pay

## 2017-10-20 VITALS — BP 120/78 | Ht 62.0 in

## 2017-10-20 DIAGNOSIS — N898 Other specified noninflammatory disorders of vagina: Secondary | ICD-10-CM

## 2017-10-20 DIAGNOSIS — N6320 Unspecified lump in the left breast, unspecified quadrant: Secondary | ICD-10-CM

## 2017-10-20 DIAGNOSIS — N6452 Nipple discharge: Secondary | ICD-10-CM

## 2017-10-20 DIAGNOSIS — N644 Mastodynia: Secondary | ICD-10-CM

## 2017-10-20 DIAGNOSIS — Z01419 Encounter for gynecological examination (general) (routine) without abnormal findings: Secondary | ICD-10-CM

## 2017-10-20 NOTE — Patient Instructions (Addendum)
Explained breast self awareness [name]. Let patient know BCCCP will cover Pap smears and HPV typing every 5 years unless has a history of abnormal Pap smears. Referred patient to the [facility] for a diagnostic mammogram and left breast ultrasound. Appointment scheduled for Thursday, Oct 20, 2017 at [place]. Let patient know will follow up with her within the next week with results of Pap smear, wet prep, and breast discharge by phone. [name] verbalized understanding.  Rodriquez Thorner, Arvil Chaco, RN 8:39 AM

## 2017-10-20 NOTE — Addendum Note (Signed)
Encounter addended by: Loletta Parish, RN on: 10/20/2017 9:21 AM  Actions taken: Sign clinical note

## 2017-10-20 NOTE — Progress Notes (Signed)
Complaints of a left breast lump x 6 months that has increased in size over the past month. Patient complained of a brownish spontaneous nipple discharge when pressed on breast and pain over the past month. Patient states the pain is worse when touched and rates at a 8-9 out of 10. Patient stated her left breast has increased in size over the past 6 months.  Pap Smear: Pap smear completed today. Last Pap smear was 10/24/2012 at [facility] and normal with negative HPV. Per patient has no history of an abnormal Pap smear. Last two Pap smear results are in Epic.  Physical exam: Breasts Left breast larger than right breast. No skin abnormalities bilateral breasts. No nipple retraction right breast. Left nipple slightly inverted that per patient is a change over the past 6 months. No nipple discharge right breast. Expressed a brownish colored discharge from the left breast on exam. Sample of discharge sent to cytology for evaluation. No lymphadenopathy. No lumps palpated bilateral breasts. Unable to palpate a lump in patients area of concern. Complaints of pain when palpated the left breast at 2 o'clock 5 cm from the nipple. Referred patient to the [facility] for a diagnostic mammogram and left breast ultrasound. Appointment scheduled for Thursday, Oct 20, 2017 at [facility].        Pelvic/Bimanual   Ext Genitalia No lesions, no swelling and no discharge observed on external genitalia.         Vagina Vagina pink and normal texture. No lesions and a thick yellowish colored discharge observed in vagina. Wet prep completed.          Cervix Cervix is present. Cervix pink and of normal texture. Thick yellowish colored discharge observed on cervix.    Uterus Uterus is present and palpable. Uterus in normal position and normal size.        Adnexae Bilateral ovaries present and palpable. No tenderness when palpated right ovary. Patient complained of tenderness when palpated left  ovary. Offered to refer patient to the [facility] [place] [facility]. Patient refused. Told patient if the pain gets worse or if she changes her mind to call and will refer her.      Rectovaginal No rectal exam completed today since patient had no rectal complaints. No skin abnormalities observed on exam.    Smoking History: Patient has never smoked.  Patient Navigation: Patient education provided. Access to services provided for patient through BCCCP program.   Breast and Cervical Cancer Risk Assessment: Patient has no family history of breast cancer, known genetic mutations, or radiation treatment to the chest before age 81. Patient has no history of cervical dysplasia, immunocompromised, or DES exposure in-utero. Patient has a 5-year risk for breast cancer at 0.3% and a lifetime risk at 7.1%.

## 2017-10-21 LAB — CYTOLOGY - PAP
Bacterial vaginitis: NEGATIVE
CANDIDA VAGINITIS: NEGATIVE
DIAGNOSIS: NEGATIVE
HPV (WINDOPATH): NOT DETECTED
Trichomonas: NEGATIVE

## 2017-11-09 ENCOUNTER — Encounter (HOSPITAL_COMMUNITY): Payer: Self-pay | Admitting: *Deleted

## 2017-11-30 ENCOUNTER — Encounter (HOSPITAL_COMMUNITY): Payer: Self-pay | Admitting: *Deleted

## 2017-11-30 NOTE — Progress Notes (Signed)
Letter mailed to patient with negative pap smear results. HPV was negative. Next pap smear due in five years. 

## 2018-03-09 ENCOUNTER — Other Ambulatory Visit: Payer: Self-pay

## 2018-03-09 ENCOUNTER — Encounter: Payer: Self-pay | Admitting: Family Medicine

## 2018-03-09 ENCOUNTER — Ambulatory Visit (INDEPENDENT_AMBULATORY_CARE_PROVIDER_SITE_OTHER): Payer: Self-pay | Admitting: Family Medicine

## 2018-03-09 VITALS — BP 122/82 | HR 77 | Temp 98.5°F | Ht 62.0 in | Wt 163.0 lb

## 2018-03-09 DIAGNOSIS — J302 Other seasonal allergic rhinitis: Secondary | ICD-10-CM

## 2018-03-09 DIAGNOSIS — R7303 Prediabetes: Secondary | ICD-10-CM

## 2018-03-09 LAB — POCT GLYCOSYLATED HEMOGLOBIN (HGB A1C): Hemoglobin A1C: 5.4 % (ref 4.0–5.6)

## 2018-03-09 LAB — GLUCOSE, POCT (MANUAL RESULT ENTRY): POC Glucose: 102 mg/dl — AB (ref 70–99)

## 2018-03-09 MED ORDER — FLUTICASONE PROPIONATE 50 MCG/ACT NA SUSP: 2.0000 | Freq: Every day | NASAL | 6 refills | Status: DC

## 2018-03-09 NOTE — Assessment & Plan Note (Signed)
A1c today is 5.4.  Patient reports doing well on the metformin and would like to continue.  She reports that she was taken off of metformin for a year in the past and reports that her diabetes had worsened during that time.  This time was also complicated by her mother's death after her mother was diagnosed with cancer.  She is eating a low-carb diet and will start to exercise as she is more confident with her ankle that she has had to have surgery on in the past.  Continue metformin  Follow-up in 6 months for A1c check and prediabetes check in.

## 2018-03-09 NOTE — Progress Notes (Signed)
SUBJECTIVE:  PCP: Sela Hilding, MD Patient ID: MRN [id number]  Date of birth: 1980-06-22  HPI [name] is a 37 y.o. female who presents to clinic for prediabetes check in.   #Prediabetes Patient reports that she is taking her metformin every day, except from the weekends sometimes.  She does notice that when she does not take her metformin she feels very tired after eating breakfast.  Patient reports that she eats 3 meals a day with snacks in between, she tries to eat every 3 hours otherwise she starts feeling a little bit shaky.  Her snacks include yogurt, protein shakes.  She limits her carbs throughout the day.  She avoids breads, tortillas, sodas.  She has had a 10 pound weight loss since August as she has started a low-carb diet with her daughter in an attempt support her weight loss. #Thirst  Drinks about 6-8 12oz and not peeing that much during the day.  Denies dry eyes but does report having some blurry vision with right peripheral loss and black spots about 2-3 months ago.  Headaches sometimes, but not everyday-- relieved with sleep.   Patient reports a lot of different sysmptoms such as odd menstrual bleeding  Review of Symptoms: See HPI  HISTORY Medications & Allergies: Reviewed with patient and updated in EMR as appropriate.   PMHx:  Patient Active Problem List   Diagnosis Date Noted  . 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 Hypochondriasis 08/19/2014  . Cystic thyroid nodule 07/03/2014  . Prediabetes 04/02/2014  . Seasonal allergies 10/24/2012  . Multiple food allergies 10/24/2012  . Obesity, unspecified 07/23/2012  . Fatty liver disease, nonalcoholic 56/81/2751  . 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  SHx  reports that she has never smoked. She has never used smokeless tobacco. She  reports that she does not drink alcohol or use drugs.  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   OBJECTIVE:  BP 122/82   Pulse 77   Temp 98.5 F (36.9 C) (Oral)   Ht 5\' 2"  (1.575 m)   Wt 163 lb (73.9 kg)   SpO2 97%   BMI 29.81 kg/m   Physical Exam:  Gen: NAD, alert, non-toxic, well-appearing, sitting comfortably  Skin: Warm and dry. No obvious rashes, lesions, or trauma. HEENT: NCAT. PERRLA. No conjunctival pallor or injection. No scleral icterus or injection.  MMM.  CV: RRR.  Normal S1-S2.  RP & DPs 2+ bilaterally. No BLEE. Resp: CTAB.  No wheezing, rales, abnormal lung sounds.  No increased WOB Abd: NTND on palpation to all 4 quadrants.  Positive bowel sounds. Psych: Cooperative with exam. Pleasant. Makes eye contact. Speech normal. Extremities: Moves all extremities spontaneously  Neuro: CN II-XII grossly intact. No FNDs.  Back: spine symmetric w/o abnormal curvature. No TTP C/T/L spinous processes   Pertinent Labs & Imaging:  Reviewed in chart  A1c 5.4  ASSESSMENT & PLAN:  Prediabetes A1c today is 5.4.  Patient reports doing well on the metformin and would like to continue.  She reports that she was taken off of metformin for a year in the past and reports that her diabetes had worsened during that time.  This time was also complicated by her mother's death after her mother was diagnosed with cancer.  She  is eating a low-carb diet and will start to exercise as she is more confident with her ankle that she has had to have surgery on in the past.  Continue metformin  Follow-up in 6 months for A1c check and prediabetes check in.    Zettie Cooley, M.D. [place]  PGY -1 03/09/2018, 8:56 AM

## 2018-03-09 NOTE — Patient Instructions (Addendum)
Dear Arletha Pili,   It was nice to see you today! I am glad you came in for your concerns. This document serves as a "wrap-up" to all that we discussed today and is listed as follows:    You are doing very well with your diet and overall health consciousness! Great work with your weight loss as well!  I admire your desire to help your daughter with her weight loss as well.   Keep up the good work. No changes will be made today.   Remember to take your Zyrtec and flonase everyday.  Please follow up in 6 months for a follow up A1C   Thank you for choosing [facility] for your primary care needs and stay well!   Best,   Dr. Zettie Cooley Resident Physician, PGY-1 [facility] [phone]    Don't forget to sign up for MyChart for instant access to your health profile, labs, orders, upcoming appointments or to contact your provider with questions. Stop at the front desk on the way out for more information about how to sign up!

## 2018-03-09 NOTE — Assessment & Plan Note (Signed)
Patient reports that she has felt congested and has some difficulty hearing in her left ear sometimes due to fluid.  She takes fluticasone nasal spray on and off.  She also takes Zyrtec 10 mg every few weeks when her symptoms worsen.  She reports that she has had increased sneezing recently with the change of season.  Re-ordered fluticasone nasal spray.  Advised patient to use daily to help relieve sinus congestion  Zyrtec 10 mg daily

## 2018-08-02 ENCOUNTER — Encounter (HOSPITAL_COMMUNITY): Payer: Self-pay

## 2018-08-02 ENCOUNTER — Ambulatory Visit (HOSPITAL_COMMUNITY)
Admission: EM | Admit: 2018-08-02 | Discharge: 2018-08-02 | Disposition: A | Discharge status: Home / Self Care | Payer: Self-pay | Attending: Family Medicine | Admitting: Family Medicine

## 2018-08-02 ENCOUNTER — Other Ambulatory Visit: Payer: Self-pay

## 2018-08-02 DIAGNOSIS — R21 Rash and other nonspecific skin eruption: Secondary | ICD-10-CM | POA: Insufficient documentation

## 2018-08-02 DIAGNOSIS — J029 Acute pharyngitis, unspecified: Secondary | ICD-10-CM | POA: Insufficient documentation

## 2018-08-02 LAB — POCT RAPID STREP A: STREPTOCOCCUS, GROUP A SCREEN (DIRECT): NEGATIVE

## 2018-08-02 MED ORDER — TRIAMCINOLONE ACETONIDE 0.1 % EX CREA: 1.0000 "application " | TOPICAL_CREAM | Freq: Two times a day (BID) | CUTANEOUS | 0 refills | Status: DC

## 2018-08-02 MED ORDER — PREDNISONE 50 MG PO TABS: 50.0000 mg | ORAL_TABLET | Freq: Every day | ORAL | 0 refills | Status: AC

## 2018-08-02 NOTE — ED Triage Notes (Signed)
Pt presents to [facility] for rash on arms and possible thrush in mouth since yesterday 08/01/2018. Pt also complains rash in mouth has a burning sensation. Pt has tried OTC medications but has no relief

## 2018-08-02 NOTE — Discharge Instructions (Signed)
Sore Throat  Your rapid strep tested Negative today. We will send for a culture and call in about 2 days if results are positive. For now we will treat your sore throat as a virus with symptom management.   Please continue Tylenol or Ibuprofen for fever and pain. May try salt water gargles, cepacol lozenges, throat spray, or OTC cold relief medicine for throat discomfort. If you also have congestion take a daily anti-histamine like Zyrtec, Claritin, and a oral decongestant to help with post nasal drip that may be irritating your throat.   Stay hydrated and drink plenty of fluids to keep your throat coated relieve irritation.   Begin prednisone daily for 5 days to help with rash/burning sensation May use kenalog cream twice daily on arms, please only use thin amount Continue cetirizine, flonase May supplement with benadryl at night time  Follow up if rash not responding, symptoms worsening

## 2018-08-04 NOTE — ED Provider Notes (Addendum)
[name]    CSN: [id number] Arrival date & time: 08/02/18  1954     History   Chief Complaint Chief Complaint  Patient presents with   Rash   Thrush    HPI [name] is a 38 y.o. female history of thyroid cyst, DM type II, presenting today for evaluation of a rash and sore throat.  Patient states that yesterday she started to develop a rash on her upper extremities.  Since she is also noticed discomfort on her tongue as well as in her throat.  She feels as if she has noticed some more prominent bumps that have been associated burning sensation to her tongue.  She denies associated congestion or cough.  Denies any new soaps, lotions or detergents.  Denies any new foods or medicines.  Denies history of similar rash.  She is not tried anything for symptoms.  Denies shortness of breath or difficulty breathing.  Denies difficulty swallowing.  Patient does note she previously had a cyst on her thyroid and occasionally will feel this area increase in size.  She feels soreness in her throat is lowered near her thyroid versus near her tonsils.  Denies any fevers.  Denies close contacts with similar rash.  Her daughter who is here with her today also with sore throat.  HPI  Past Medical History:  Diagnosis Date   Allergy    Anxiety    ANXIETY 12/31/2008   Qualifier: Diagnosis of  By: Nadara Eaton  MD, Mickel Baas     Breast mass in female 9/52/8413   Complication of anesthesia    Problem waking up once and a headache   Condyloma acuminatum 07/21/2006   Qualifier: History of  By: Oneal Grout MD, Manuela Schwartz     Depression    Diabetes type 2, controlled ([place]) 04/02/2014   Family history of anesthesia complication    Mother had problem waking up   Heart murmur    Hx gestational diabetes    Menorrhagia 07/03/2014   Spinal headache    Thyroid fullness 04/02/2014    Patient Active Problem List   Diagnosis Date Noted   Coalition, 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    Hypochondriasis 08/19/2014   Cystic thyroid nodule 07/03/2014   Prediabetes 04/02/2014   Seasonal allergies 10/24/2012   Multiple food allergies 10/24/2012   Obesity, unspecified 07/23/2012   Fatty liver disease, nonalcoholic 24/40/1027   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 Authorizing Provider  cetirizine (ZYRTEC) 10 MG tablet Take 1 tablet (10 mg total) by mouth daily. 09/05/17  Yes Nicolette Bang, DO  fluticasone (FLONASE) 50 MCG/ACT nasal spray Place  2 sprays into both nostrils daily. 03/09/18  Yes Wilber Oliphant, MD  metFORMIN (GLUCOPHAGE-XR) 500 MG 24 hr tablet Take 1 tablet (500 mg total) by mouth daily with breakfast. 09/05/17  Yes Nicolette Bang, DO  predniSONE (DELTASONE) 50 MG tablet Take 1 tablet (50 mg total) by mouth daily for 5 days. 08/02/18 08/07/18  Quintasia Theroux C, PA-C  triamcinolone cream (KENALOG) 0.1 % Apply 1 application topically 2 (two) times daily. 08/02/18   Rebecca Cairns, Elesa Hacker, PA-C    Family History Family History  Problem Relation Age of Onset   Hypertension Mother    Hyperlipidemia Mother    Cancer Mother        cervical   Diabetes Other    Breast cancer Paternal Aunt     Social History Social History   Tobacco Use   Smoking status: Never Smoker   Smokeless tobacco: Never Used  Substance Use Topics   Alcohol use: No    Comment: hasn't had a drink since  07/07/2013   Drug use: No     Allergies   Amoxicillin; Penicillins; and Nsaids   Review of Systems Review of Systems  Constitutional: Negative for fatigue and fever.  HENT: Positive for sore throat. Negative for congestion and mouth sores.   Eyes: Negative for visual disturbance.  Respiratory: Negative for shortness of breath.   Cardiovascular: Negative for chest pain.  Gastrointestinal: Negative for abdominal pain, nausea and vomiting.  Musculoskeletal: Negative for arthralgias and joint swelling.  Skin: Positive for color change and rash. Negative for wound.  Neurological: Negative for dizziness, weakness, light-headedness and headaches.     Physical Exam Triage Vital Signs ED Triage Vitals [08/02/18 2027]  Enc Vitals Group     BP 130/65     Pulse Rate 67     Resp 16     Temp 98.6 F (37 C)     Temp Source Oral     SpO2 100 %     Weight      Height      Head Circumference      Peak Flow      Pain Score 2     Pain Loc      Pain Edu?      Excl. in [place]?    No data found.  Updated Vital Signs BP 130/65 (BP Location: Left Arm)    Pulse 67    Temp 98.6 F (37 C) (Oral)    Resp 16    LMP 07/28/2018 (Exact Date)    SpO2 100%   Visual Acuity Right Eye Distance:   Left Eye Distance:   Bilateral Distance:    Right Eye Near:   Left Eye Near:    Bilateral Near:     Physical Exam Vitals signs and nursing note reviewed.  Constitutional:      General: She is not in acute distress.    Appearance: She is well-developed.  HENT:     Head: Normocephalic and atraumatic.     Mouth/Throat:     Comments: Oral mucosa pink and moist, no tonsillar enlargement or exudate. Posterior pharynx patent and nonerythematous, no uvula deviation or swelling. Normal phonation. Tongue without obvious rash, taste but slightly more prominent and erythematous to tip No significant erythema throughout oral mucosa/tongue Eyes:     Conjunctiva/sclera: Conjunctivae normal.  Neck:      Musculoskeletal: Neck supple.     Comments: No lymphadenopathy Cardiovascular:     Rate and Rhythm: Normal rate and regular  rhythm.     Heart sounds: No murmur.  Pulmonary:     Effort: Pulmonary effort is normal. No respiratory distress.     Breath sounds: Normal breath sounds.     Comments: Breathing comfortably at rest, CTABL, no wheezing, rales or other adventitious sounds auscultated Abdominal:     Palpations: Abdomen is soft.     Tenderness: There is no abdominal tenderness.  Skin:    General: Skin is warm and dry.     Comments: Bilateral upper extremities with faintly papular, faint erythema and surrounding slight hypopigmentation; does not extend to trunk or lower extremities; no rash on face  Neurological:     Mental Status: She is alert.      UC Treatments / Results  Labs (all labs ordered are listed, but only abnormal results are displayed) Labs Reviewed  CULTURE, GROUP A STREP [facility])  POCT RAPID STREP A    EKG None  Radiology No results found.  Procedures Procedures (including critical care time)  Medications Ordered in UC Medications - No data to display  Initial Impression / Assessment and Plan / UC Course  I have reviewed the triage vital signs and the nursing notes.  Pertinent labs & imaging results that were available during my care of the patient were reviewed by me and considered in my medical decision making (see chart for details).     Strep test was negative, rash possible allergic versus contact dermatitis given acute onset.  Will treat with trial of prednisone and Kenalog to help with symptoms, also recommended taking antihistamines.  Airway maintained, vital signs stable.  Also advised patient to follow-up with primary care for reevaluation of thyroid and says that this is not been done recently.Discussed strict return precautions. Patient verbalized understanding and is agreeable with plan.  Final Clinical Impressions(s) / UC Diagnoses    Final diagnoses:  Rash and nonspecific skin eruption  Sore throat     Discharge Instructions     Sore Throat  Your rapid strep tested Negative today. We will send for a culture and call in about 2 days if results are positive. For now we will treat your sore throat as a virus with symptom management.   Please continue Tylenol or Ibuprofen for fever and pain. May try salt water gargles, cepacol lozenges, throat spray, or OTC cold relief medicine for throat discomfort. If you also have congestion take a daily anti-histamine like Zyrtec, Claritin, and a oral decongestant to help with post nasal drip that may be irritating your throat.   Stay hydrated and drink plenty of fluids to keep your throat coated relieve irritation.   Begin prednisone daily for 5 days to help with rash/burning sensation May use kenalog cream twice daily on arms, please only use thin amount Continue cetirizine, flonase May supplement with benadryl at night time  Follow up if rash not responding, symptoms worsening   ED Prescriptions    Medication Sig Dispense Auth. Provider   predniSONE (DELTASONE) 50 MG tablet Take 1 tablet (50 mg total) by mouth daily for 5 days. 5 tablet Glenn Gullickson C, PA-C   triamcinolone cream (KENALOG) 0.1 % Apply 1 application topically 2 (two) times daily. 30 g Bri Wakeman, [place] C, PA-C     Controlled Substance Prescriptions [place] Controlled Substance Registry consulted? Not Applicable   Janith Lima, PA-C 08/04/18 1043    Adja Ruff, [place] C, [place] 08/04/18 1043

## 2018-08-05 LAB — CULTURE, GROUP A STREP (THRC)

## 2018-08-07 ENCOUNTER — Telehealth (HOSPITAL_COMMUNITY): Payer: Self-pay | Admitting: Emergency Medicine

## 2018-08-07 NOTE — Telephone Encounter (Signed)
Culture is positive for non group A Strep germ.  This is a finding of uncertain significance; not the typical 'strep throat' germ.  Called patient states shes feeling better. No treatment needed.

## 2018-11-21 ENCOUNTER — Telehealth: Payer: Self-pay | Admitting: *Deleted

## 2018-11-21 NOTE — Telephone Encounter (Signed)
Received message from pt that metformin has been recalled.  She needs an alternative sent in. Christen Bame, CMA

## 2018-11-23 NOTE — Telephone Encounter (Signed)
Called pharmacy and they have automatic replacement for the Metformin XR products that have been recalled.

## 2018-12-01 ENCOUNTER — Other Ambulatory Visit: Payer: Self-pay

## 2018-12-01 ENCOUNTER — Ambulatory Visit (INDEPENDENT_AMBULATORY_CARE_PROVIDER_SITE_OTHER): Payer: Self-pay | Admitting: Family Medicine

## 2018-12-01 ENCOUNTER — Encounter: Payer: Self-pay | Admitting: Family Medicine

## 2018-12-01 ENCOUNTER — Telehealth: Payer: Self-pay

## 2018-12-01 VITALS — BP 104/62 | HR 74 | Ht 62.0 in | Wt 167.1 lb

## 2018-12-01 DIAGNOSIS — E6609 Other obesity due to excess calories: Secondary | ICD-10-CM

## 2018-12-01 DIAGNOSIS — B081 Molluscum contagiosum: Secondary | ICD-10-CM

## 2018-12-01 DIAGNOSIS — D352 Benign neoplasm of pituitary gland: Secondary | ICD-10-CM

## 2018-12-01 DIAGNOSIS — R7303 Prediabetes: Secondary | ICD-10-CM

## 2018-12-01 DIAGNOSIS — D353 Benign neoplasm of craniopharyngeal duct: Secondary | ICD-10-CM

## 2018-12-01 DIAGNOSIS — E0789 Other specified disorders of thyroid: Secondary | ICD-10-CM

## 2018-12-01 DIAGNOSIS — K760 Fatty (change of) liver, not elsewhere classified: Secondary | ICD-10-CM

## 2018-12-01 DIAGNOSIS — E041 Nontoxic single thyroid nodule: Secondary | ICD-10-CM

## 2018-12-01 DIAGNOSIS — Z6830 Body mass index (BMI) 30.0-30.9, adult: Secondary | ICD-10-CM

## 2018-12-01 HISTORY — DX: Molluscum contagiosum: B08.1

## 2018-12-01 LAB — POCT GLYCOSYLATED HEMOGLOBIN (HGB A1C): HbA1c, POC (controlled diabetic range): 5.4 % (ref 0.0–7.0)

## 2018-12-01 MED ORDER — CIMETIDINE 200 MG PO TABS: 200.0000 mg | ORAL_TABLET | Freq: Two times a day (BID) | ORAL | 0 refills | Status: DC

## 2018-12-01 MED ORDER — SALICYLIC ACID 17 % EX GEL: Freq: Two times a day (BID) | CUTANEOUS | 0 refills | Status: AC

## 2018-12-01 MED ORDER — METFORMIN HCL ER 500 MG PO TB24: 500.0000 mg | ORAL_TABLET | Freq: Every day | ORAL | 3 refills | Status: DC

## 2018-12-01 NOTE — Telephone Encounter (Signed)
Patient calls nurse line stating she received a missed call from Dr. Maudie Mercury about metformin. Please give her a call back. It is ok to leave a message.

## 2018-12-01 NOTE — Progress Notes (Signed)
Established Patient - Clinic Visit Subjective  Subjective  Patient ID: MRN [id number]  Date of birth: [birth year]   PCP: Wilber Oliphant, MD  CC: Rash + A1C check   [name] is a 38 y.o. female with past medical history significant for  Prediabetes (5.9>5.4) who presents today with the following problems:  HPI: A1C Check  Patient is currently taking metformin 500 XR mg.  She is not having any side effects at this time.  At her most recent check, her A1c was 5.4.  She opted to continue on her metformin at that time.  Her repeat A1c today is 5.4.  Molluscum contagiosum Patient reports that her children were infected with molluscum contagiosum and treated at the pediatrician.  Patient reports that she started to develop the small spots on her inner left arm.  She reports pruritus and extension of lesions onto her axilla and breast.  She reports using some of her children's topical medication and it did not work.  Her children have been treated with beetle juice at their pediatrician.  Her children still have some lesions left.  She denies any pain.  HISTORY Medications, allergies, medical history, family history and social history were reviewed. Pertinent findings included in HPI. Updated allergies.  Patient reports that she does not have any issues with taking Tylenol or ibuprofen any longer.  Social Hx: [name] reports that she has never smoked. She has never used smokeless tobacco. She reports that she does not drink alcohol or use drugs.   ROS: See HPI   Objective   Objective  Physical Exam:  BP 104/62   Pulse 74   Ht 5\' 2"  (1.575 m)   Wt 167 lb 2 oz (75.8 kg)   LMP 11/01/2018   SpO2 98%   BMI 30.57 kg/m  General: NAD, non-toxic, well-appearing, sitting comfortably in chair HEENT: [place]/AT. PERRLA. EOMI.  Cardiovascular: RRR, normal S1, S2. B/L 2+ RP.  No BLEE Respiratory: CTAB. No IWOB.  Abdomen: + BS. NT, ND, soft to palpation.  Extremities: Warm and well perfused. Moving  spontaneously.   Integumentary: Left inner upper arm with scattered umbilicated pustular lesions of varying size, 9mm-2mm.  Neuro:CN grossly intact. No FND  Pertinent Labs & Imaging:  CMP, CBC, Lipid wnl.  HIV NR.  A1C 5.4 Diminished pit adenoma in 2018    Assessment  Assessment & Plan  Fatty liver disease, nonalcoholic CMP wnl. Will continue to monitor   PITUITARY ADENOMA Patient denies any current symptoms  Prediabetes A1c is 5.4 today.  Patient prefers to stay on metformin 500 XR.  Prescription refilled for the year.  Molluscum contagiosum infection Patient's children were infected with small skin contagiosum and are currently being treated.  Patient has had lesions on her left inner arm which have extended to her left axilla and left lateral breast.  Patient originally agreeable with cimetidine, however, prescription was canceled as it interacts with metformin.  She is to use salicylic acid 24% twice daily for 3 weeks.  Patient tolerated cryotherapy of 3 larger lesions on her axilla.  As she has several lesions, cryotherapy of all lesions at this time would not be possible.  Patient is aware that these lesions will go away on their own.  Concerning as patient is adult and suggests possible immunocompromise.  HIV nonreactive and CBC within normal limits.  Salicylic acid topical twice daily x3 weeks  Patient to return if she desires further cryotherapy  Wearing long sleeves to help avoid further spread  of rash  Cystic thyroid nodule TSH wnl  Obesity, unspecified Diet and exercise  Lipid panel within normal limits     Wilber Oliphant, M.D.  PGY-2  Family Medicine  12/03/2018 1:54 AM

## 2018-12-01 NOTE — Patient Instructions (Addendum)
Dear [name],   It was good to see you! Thank you for taking your time to come in to be seen. Today, we discussed the following:   A1C   Your a1c looks great!   I will call your pharmacy to figure out your metformin prescription  Thyroid   I will follow up with you on results   Rash- Molluscum   Take cimetidine twice daily   We will freeze some today  Please return for more therapy if wanted   Please follow up in 1-2 weeks or sooner for concerning or worsening symptoms.   Be well,   [name], M.D   [facility] [phone]  *Sign up for MyChart for instant access to your health profile, labs, orders, upcoming appointments or to contact your provider with questions*  =================================================================================== Some important items for [name]'s health:    Your Blood Pressure.  Should be LESS THAN 140/90 or 150/90 if you are over 65 BP Readings from Last 3 Encounters:  12/01/18 104/62  08/02/18 130/65  03/09/18 122/82    Your Weight History Wt Readings from Last 3 Encounters:  12/01/18 167 lb 2 oz (75.8 kg)  03/09/18 163 lb (73.9 kg)  10/06/17 167 lb (75.8 kg)    Body mass index is 30.57 kg/m.  BMI Classes Classification BMI Category (kg/m2)  Underweight < 18.5  Normal Weight 18.5-24.9  Overweight  25.0-29.9  Obese Class I 30.0-34.9  Obese Class II 35.0-39.9  Obese Class III  > or  = 40.0      Your last A1C     Every 3-6 months if you have diabetes Lab Results  Component Value Date   HGBA1C 5.4 12/01/2018    Your last Cholesterol   Every 1-5 years    Component Value Date/Time   CHOL 160 09/05/2017 0923   HDL 40 09/05/2017 0923   LDLCALC 86 09/05/2017 0923    Your last Blood Tests -  Once a year if you take medications    Component Value Date/Time   K 4.9 09/05/2017 0923   CREATININE 0.61 09/05/2017 0923   CREATININE 0.67 04/25/2015 0959   GLUCOSE 105 (H) 09/05/2017 0923   GLUCOSE 97 04/25/2015 0959    To Keep You Healthy Your are due for the following Health Maintenance Items:  There are no preventive care reminders to display for this patient.  Please schedule an appointment with your healthcare provider for any questions or concerns regarding your health or any of the items above.   Molluscum Contagiosum, Adult Molluscum contagiosum is a skin infection that can cause a rash. Your rash may go away on its own, or you may need to have a procedure or use medicine to treat the rash. What are the causes? This condition is caused by a virus. The virus can spread from person to person (is contagious). It can spread through:  Skin-to-skin contact with an infected person.  Contact with an object that has the virus on it (contaminated object), such as a towel or clothing.  Sexual activity. What increases the risk? You may be more likely to develop this condition if you:  Live in an area where the weather is moist and warm.  Have a weak disease-fighting system (immune system). What are the signs or symptoms? The main symptom of this condition is a painless rash that appears 2-7 weeks after exposure to the virus. The rash is made up of small, dome-shaped bumps on the skin. The bumps  may:  Affect the genitals, thighs, face, neck, or abdomen.  Be pink or flesh-colored.  Appear one by one or in groups.  Range from the size of a pinhead to the size of a pencil eraser.  Feel firm, smooth, and waxy.  Have a pit in the middle.  Itch. For most people, the rash does not itch. How is this diagnosed? This condition may be diagnosed based on:  Your symptoms and medical history.  A physical exam.  Scraping the bumps to collect a skin sample for testing. How is this treated? The rash usually goes away within 2 months, but it can sometimes take 6-12 months for it to clear completely. For some people, the rash may go away on its own, without treatment. In some  cases, treatment may be needed to keep the virus from infecting other people or to keep the rash from spreading to other parts of your body. Treatment may also be done if you have anxiety or stress because of the way the rash looks. If you do need treatment, the options may include:  Surgery to remove the bumps by freezing them (cryosurgery).  A procedure to scrape off the bumps (curettage).  A procedure to remove the bumps with a laser.  Putting medicine on the bumps (topical treatment). Follow these instructions at home: General instructions  Take or apply over-the-counter and prescription medicines only as told by your health care provider.  Do not scratch or pick at the bumps. Scratching or picking can cause the rash to spread to other parts of your body. Preventing infection As long as you have bumps on your skin, the infection can spread to other people. To prevent this from happening:  Do not share clothing or towels with others until the bumps go away.  Do not use a public swimming pool, sauna, or shower until the bumps go away.  Avoid close contact with others until the bumps go away. This includes sexual contact.  Wash your hands often with soap and water. If soap and water are not available, use hand sanitizer.  Cover the bumps with clothing or a bandage when you will be near other people. Contact a health care provider if:  The bumps are spreading.  The bumps are becoming red and sore.  The bumps have not gone away after 12 months. Summary  Molluscum contagiosum is a skin infection that can cause a rash made up of small, dome-shaped bumps.  The infection is caused by a virus.  The rash usually goes away within 2 months, but it can sometimes take 6-12 months for it to clear completely.  The rash often goes away on its own. However, treatment is sometimes recommended to keep the virus from infecting other people or to keep it from spreading to other parts of your  body. This information is not intended to replace advice given to you by your health care provider. Make sure you discuss any questions you have with your health care provider. Document Released: 12/05/2013 Document Revised: 09/01/2018 Document Reviewed: 05/23/2017 Elsevier Patient Education  2020 [employer].

## 2018-12-01 NOTE — Assessment & Plan Note (Addendum)
CMP wnl. Will continue to monitor

## 2018-12-01 NOTE — Assessment & Plan Note (Addendum)
Patient's children were infected with small skin contagiosum and are currently being treated.  Patient has had lesions on her left inner arm which have extended to her left axilla and left lateral breast.  Patient originally agreeable with cimetidine, however, prescription was canceled as it interacts with metformin.  She is to use salicylic acid 74% twice daily for 3 weeks.  Patient tolerated cryotherapy of 3 larger lesions on her axilla.  As she has several lesions, cryotherapy of all lesions at this time would not be possible.  Patient is aware that these lesions will go away on their own.  Concerning as patient is adult and suggests possible immunocompromise.  HIV nonreactive and CBC within normal limits.  Salicylic acid topical twice daily x3 weeks  Patient to return if she desires further cryotherapy  Wearing long sleeves to help avoid further spread of rash

## 2018-12-01 NOTE — Assessment & Plan Note (Signed)
A1c is 5.4 today.  Patient prefers to stay on metformin 500 XR.  Prescription refilled for the year.

## 2018-12-01 NOTE — Telephone Encounter (Signed)
Spoke to patient over the phone and confirmed changes to medications due to drug interactions with cimetidine and metformin. Cimetidine cancelled by pharmacy and patient is aware. Will do salicylic acid instead and continue metformin

## 2018-12-01 NOTE — Assessment & Plan Note (Signed)
Patient denies any current symptoms.

## 2018-12-02 LAB — COMPREHENSIVE METABOLIC PANEL
ALT: 18 IU/L (ref 0–32)
AST: 14 IU/L (ref 0–40)
Albumin/Globulin Ratio: 1.4 (ref 1.2–2.2)
Albumin: 4.1 g/dL (ref 3.8–4.8)
Alkaline Phosphatase: 81 IU/L (ref 39–117)
BUN/Creatinine Ratio: 20 (ref 9–23)
BUN: 12 mg/dL (ref 6–20)
Bilirubin Total: 0.3 mg/dL (ref 0.0–1.2)
CO2: 19 mmol/L — ABNORMAL LOW (ref 20–29)
Calcium: 9.3 mg/dL (ref 8.7–10.2)
Chloride: 104 mmol/L (ref 96–106)
Creatinine, Ser: 0.61 mg/dL (ref 0.57–1.00)
GFR calc Af Amer: 133 mL/min/{1.73_m2} (ref >59)
GFR calc non Af Amer: 115 mL/min/{1.73_m2} (ref >59)
Globulin, Total: 3 g/dL (ref 1.5–4.5)
Glucose: 105 mg/dL — ABNORMAL HIGH (ref 65–99)
Potassium: 4.2 mmol/L (ref 3.5–5.2)
Sodium: 139 mmol/L (ref 134–144)
Total Protein: 7.1 g/dL (ref 6.0–8.5)

## 2018-12-02 LAB — TSH: TSH: 1.3 u[IU]/mL (ref 0.450–4.500)

## 2018-12-02 LAB — CBC WITH DIFFERENTIAL/PLATELET
Basophils Absolute: 0 10*3/uL (ref 0.0–0.2)
Basos: 1 %
EOS (ABSOLUTE): 0.3 10*3/uL (ref 0.0–0.4)
Eos: 4 %
Hematocrit: 37.6 % (ref 34.0–46.6)
Hemoglobin: 12.9 g/dL (ref 11.1–15.9)
Immature Grans (Abs): 0 10*3/uL (ref 0.0–0.1)
Immature Granulocytes: 0 %
Lymphocytes Absolute: 1.7 10*3/uL (ref 0.7–3.1)
Lymphs: 25 %
MCH: 31.7 pg (ref 26.6–33.0)
MCHC: 34.3 g/dL (ref 31.5–35.7)
MCV: 92 fL (ref 79–97)
Monocytes Absolute: 0.5 10*3/uL (ref 0.1–0.9)
Monocytes: 7 %
Neutrophils Absolute: 4.3 10*3/uL (ref 1.4–7.0)
Neutrophils: 63 %
Platelets: 267 10*3/uL (ref 150–450)
RBC: 4.07 x10E6/uL (ref 3.77–5.28)
RDW: 12.3 % (ref 11.7–15.4)
WBC: 6.8 10*3/uL (ref 3.4–10.8)

## 2018-12-02 LAB — LIPID PANEL
Chol/HDL Ratio: 3.9 ratio (ref 0.0–4.4)
Cholesterol, Total: 156 mg/dL (ref 100–199)
HDL: 40 mg/dL (ref >39)
LDL Calculated: 84 mg/dL (ref 0–99)
Triglycerides: 161 mg/dL — ABNORMAL HIGH (ref 0–149)
VLDL Cholesterol Cal: 32 mg/dL (ref 5–40)

## 2018-12-02 LAB — HIV ANTIBODY (ROUTINE TESTING W REFLEX): HIV Screen 4th Generation wRfx: NONREACTIVE

## 2018-12-03 NOTE — Assessment & Plan Note (Signed)
TSH wnl .  .

## 2018-12-03 NOTE — Assessment & Plan Note (Signed)
Diet and exercise  Lipid panel within normal limits

## 2018-12-04 ENCOUNTER — Other Ambulatory Visit: Payer: Self-pay | Admitting: *Deleted

## 2018-12-04 DIAGNOSIS — J309 Allergic rhinitis, unspecified: Secondary | ICD-10-CM

## 2018-12-04 MED ORDER — CETIRIZINE HCL 10 MG PO TABS: 10.0000 mg | ORAL_TABLET | Freq: Every day | ORAL | 11 refills | Status: DC

## 2018-12-05 ENCOUNTER — Telehealth: Payer: Self-pay | Admitting: *Deleted

## 2018-12-05 NOTE — Telephone Encounter (Signed)
Pt returned call and I informed her of below.[name], CMA

## 2018-12-05 NOTE — Telephone Encounter (Signed)
-----   Message from Wilber Oliphant, MD sent at 12/03/2018  1:58 AM EDT ----- Please let patient all results are within normal limits and are not concerning.  Please let patient know that HIV is negative.

## 2018-12-05 NOTE — Telephone Encounter (Signed)
LVM via [facility]#[phone] to call office back to inform her of below.[name], CMA

## 2019-03-05 ENCOUNTER — Ambulatory Visit (INDEPENDENT_AMBULATORY_CARE_PROVIDER_SITE_OTHER): Payer: Self-pay | Admitting: Family Medicine

## 2019-03-05 ENCOUNTER — Encounter: Payer: Self-pay | Admitting: Family Medicine

## 2019-03-05 ENCOUNTER — Other Ambulatory Visit: Payer: Self-pay

## 2019-03-05 VITALS — BP 110/70 | HR 72

## 2019-03-05 DIAGNOSIS — S86911A Strain of unspecified muscle(s) and tendon(s) at lower leg level, right leg, initial encounter: Secondary | ICD-10-CM

## 2019-03-05 DIAGNOSIS — B081 Molluscum contagiosum: Secondary | ICD-10-CM

## 2019-03-05 HISTORY — DX: Strain of unspecified muscle(s) and tendon(s) at lower leg level, right leg, initial encounter: S86.911A

## 2019-03-05 MED ORDER — TRIAMCINOLONE ACETONIDE 0.1 % EX CREA: 1.0000 "application " | TOPICAL_CREAM | Freq: Two times a day (BID) | CUTANEOUS | 0 refills | Status: DC

## 2019-03-05 MED ORDER — CYCLOBENZAPRINE HCL 5 MG PO TABS: 5.0000 mg | ORAL_TABLET | Freq: Three times a day (TID) | ORAL | 1 refills | Status: DC | PRN

## 2019-03-05 NOTE — Progress Notes (Signed)
Subjective  CC: Rash and foot cramp (right foot cramp)  [id number] [name] is a 38 y.o. female who presents today with the following problems:  Molluscum contagiosum Patient presents for removal of lesions as a follow-up from 12/01/18.  She reports that everybody in her family has had a few breakouts but hers are the worst.  She describes herself as having sensitive skin.  She has tried some creams at home and apple cider vinegar but that has not worked.  The lesions that we performed cryotherapy on previously have healed leaving slight light brown scarring on skin.  Patient reports increase in size and number of lesions but they still remain in the same area.  She reports trying to be careful when interacting with her children student limit infection spread.  Right leg strain Patient reports that she started experiencing some pain in her right lower extremity 1 week ago.  She denies any trauma at the time but relapsed at the end of the night after a long day of working.  She reports that there is pain anytime that she walks on her right leg.  The pain is better if she stays still.  She also reports that her swelling of her ankle at the end of the night.  She reports that the pain is worse when there is more swelling.  She describes muscle tension at the posterior lateral aspect of her right lower extremity that feels like a knot.  Patient reports that her husband tried to massage it out but this did not help with complete resolution.  Patient reports that when she feels the specific muscle spasming, she can feel pain and pulling on the area under her lateral malleolus.  Both areas are tender to touch.  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 ROS: Pertinent ROS included in HPI. Objective  Physical Exam:  BP 110/70   Pulse 72   LMP 02/27/2019 (Approximate)   SpO2 98%  General: Well appearing female sitting comfortably in  exam room. Non-toxic, appears well.  Skin: Multiple scattered pinpoint to 4 mL pustular lesions with central umbilication.  Some larger lesions appear to be erythematous.  Lesions located on left under arm, left forearm, left axillary region under her breast.  Procedure: Excision of Benign Skin Lesion Procedure Note   PRE-OP DIAGNOSIS: Need to removal of molluscum contagiosum POST-OP DIAGNOSIS: Same PROCEDURE: skin lesion excision Performing Physician: Wilber Oliphant, MD Supervising Physician (if applicable): none  PROCEDURE: x Cryotherapy    x Punch (Size 62mm)  The area surrounding the skin lesion was prepared and draped in the usual sterile manner. The lesions were removed in the usual manner by the biopsy method noted above. 3 larger lesions >48mm were excised with punch. The remainder of the 2-22mm lesions were treated with cryotherapy. Hemostasis was assured.   Closure: Silver nitrate  Followup: The patient tolerated the procedure well without complications. Standard post-procedure care is explained and return precautions are given.   Assessment & Plan    Problem List Items Addressed This Visit      Musculoskeletal and Integument   Molluscum contagiosum infection - Primary    Patient following up for molluscum contagiosum infection.  Patient continues to have small pinpoint to 4 mm lesions on left her arm and on the axillary area.  Performed cryotherapy on smaller lesions 2-3 mm and under.  Use punch biopsy for curettage of 3 larger 4 mm lesions. Patient following up in 1  week to continue removing lesions from her axillary area and under her breasts. -Sent in triamcinolone cream to help with itching of smaller lesions to prevent spread      Muscle strain of lower leg, right, initial encounter    Patient's history and presentation consistent with muscle strain.  There is tenderness palpation directly under the lateral malleolus as well as fibula attachment sites.   Patient reports that there is swelling throughout the day and that her pain is worse at night.  Given patient's frequent use of right lower extremity while working in [employer] full-time, it may be difficult for patient to heal from this more quickly if she were able to [place].  Flexeril as needed  Compression   Ice  Surgical boot from previous surgery will help her decrease irritation at origin and insertion sites.         Follow-up: Return visit in 1 week.  Wilber Oliphant, M.Janice.  PGY-2  Family Medicine  [phone] 03/05/2019 11:00 AM

## 2019-03-05 NOTE — Assessment & Plan Note (Signed)
Patient's history and presentation consistent with muscle strain.  There is tenderness palpation directly under the lateral malleolus as well as fibula attachment sites.  Patient reports that there is swelling throughout the day and that her pain is worse at night.  Given patient's frequent use of right lower extremity while working in [employer] full-time, it may be difficult for patient to heal from this more quickly if she were able to [place].  Flexeril as needed  Compression   Ice  Surgical boot from previous surgery will help her decrease irritation at origin and insertion sites.

## 2019-03-05 NOTE — Assessment & Plan Note (Signed)
Patient following up for molluscum contagiosum infection.  Patient continues to have small pinpoint to 4 mm lesions on left her arm and on the axillary area.  Performed cryotherapy on smaller lesions 2-3 mm and under.  Use punch biopsy for curettage of 3 larger 4 mm lesions. Patient following up in 1 week to continue removing lesions from her axillary area and under her breasts. -Sent in triamcinolone cream to help with itching of smaller lesions to prevent spread

## 2019-03-05 NOTE — Patient Instructions (Signed)
Dear [name],   It was good to see you! Thank you for taking your time to come in to be seen. Today, we discussed the following:   Molluscum    Please return in one week for removal of other lesions   Sent cream to pharmacy  Future Appointments  Date Time Provider [place]  03/12/2019  8:30 AM ACCESS TO CARE POOL [id number] [place]    Leg Cramping   Use boot to help stablize muscle and joint  Flexeril to relax muscle (can cause drowsiness)    Be well,   Zettie Cooley, M.D   [place] [phone]  *Sign up for MyChart for instant access to your health profile, labs, orders, upcoming appointments or to contact your provider with questions*  ===================================================================================   Cuidado de las heridas, en adultos Wound Care, Adult El cuidado correcto de las heridas puede ayudar a Information systems manager y a [place] infecciones y formacin de cicatrices. Adems, puede ayudar a que la cicatrizacin sea ms rpida. Cmo cuidar la herida Cuidados de la herida      Siga las instrucciones del mdico acerca del cuidado de la herida. Asegrese de hacer lo siguiente: ? Lvese las manos con agua y jabn antes de cambiar la venda (vendaje). Use desinfectante para manos si no dispone de [place] y [place]. ? Cambie el vendaje como se lo haya indicado el mdico. ? No retire los puntos (suturas), la goma para cerrar la piel o las tiras [place]. Es posible que estos cierres cutneos deban quedar puestos en la piel durante 2semanas o ms tiempo. Si los bordes de las tiras adhesivas empiezan a despegarse y Therapist, sports, puede recortar los que estn sueltos. No retire las tiras [facility] por completo a menos que el mdico se lo indique.  Controle la zona de la herida todos los das para detectar signos de infeccin. Est atento a los siguientes signos: ? Enrojecimiento, hinchazn o dolor. ? Lquido o sangre. ? Calor. ? Pus  o mal olor.  Pregntele al mdico si debe limpiar la herida con agua y [place]. Hacer esto puede incluir lo siguiente: ? Usar una toalla limpia para secar la herida dando palmaditas despus de limpiarla. No se frote ni restriegue la herida. ? Aplicar una crema o un ungento. Hgalo nicamente como se lo haya indicado el mdico. ? Cubrir la incisin con un vendaje limpio.  Pregntele al mdico cundo puede dejar la herida al descubierto.  Mantenga el vendaje seco hasta que el mdico le diga que se lo puede quitar. No tome baos de inmersin, nade, use el jacuzzi ni haga ninguna actividad en la que sumerja la herida en agua hasta que el mdico lo autorice. Pregntele al mdico si puede ducharse. [place] Pounds solo le permitan darse baos de [place]. Medicamentos   Si le recetaron un antibitico, una crema o un ungento con antibitico, tmelo o aplqueselo como se lo haya indicado el mdico. No deje de tomar o de usar el antibitico, aunque la afeccin mejore.  Tome los medicamentos de venta libre y los recetados solamente como se lo haya indicado el mdico. Si le recetaron un analgsico, tmelo al menos 103minutos antes de Optometrist el cuidado de la herida, Civil Service fast streamer se lo haya indicado el mdico. Instrucciones generales  Reanude sus actividades normales segn lo indicado por el mdico. Pregntele al mdico qu actividades son seguras.  No se rasque ni se toque la herida.  No consuma ningn producto que contenga nicotina o tabaco,  como cigarrillos y Psychologist, sport and exercise. El consumo de esos productos puede demorar la cicatrizacin de la herida. Si necesita ayuda para dejar de fumar, consulte al mdico.  Concurra a todas las visitas de seguimiento como se lo haya indicado el mdico. Esto es importante.  Siga una dieta que incluya protenas, vitaminas A y C y otros alimentos ricos en nutrientes que ayudarn a que la herida cicatrice. ? Los alimentos ricos en protenas incluyen carne, productos  lcteos, frijoles y nueces, entre otras fuentes de protena. ? Los alimentos ricos en vitaminaA incluyen zanahorias y verduras de hoja verde oscuro. ? Los alimentos ricos en vitaminaC incluyen ctricos, tomates y [place] frutas y verduras. ? Los alimentos ricos en nutrientes tienen protenas, carbohidratos, grasas, vitaminas o minerales. Consuma una variedad de alimentos saludables, que incluya frutas, verduras y Psychologist, prison and probation services. Comunquese con un mdico si:  Le aplicaron la antitetnica y tiene hinchazn, dolor intenso, enrojecimiento o hemorragia en el sitio de la inyeccin.  El dolor no se alivia con los [employer].  Tiene enrojecimiento, hinchazn o dolor alrededor de la herida.  Observa lquido o sangre que sale de la herida.  La herida se siente caliente al tacto.  Tiene pus o percibe mal olor que emana de la herida.  Tiene fiebre o siente escalofros.  Siente nuseas o vomita.  Tiene mareos. Solicite ayuda de inmediato si:  Tiene una lnea roja que sale de la herida.  Los bordes de la herida se abren y se separan.  La herida sangra y el sangrado no se detiene con una presin [place].  Tiene una erupcin cutnea.  Se desmaya.  Tiene dificultad para respirar. Resumen  Siempre lvese las manos con agua y jabn antes de cambiar la venda (vendaje).  Para ayudar con la cicatrizacin, coma alimentos ricos en protenas, vitaminas A y C y dems nutrientes.  Controle la herida [employer] para detectar signos de infeccin. Comunquese con el mdico si sospecha que la herida est infectada. Esta informacin no tiene Marine scientist el consejo del mdico. Asegrese de hacerle al mdico cualquier pregunta que tenga. Document Released: 01/07/2011 Document Revised: 08/22/2017 Document Reviewed: 11/25/2015 Elsevier Patient Education  2020 [employer].

## 2019-03-12 ENCOUNTER — Ambulatory Visit (INDEPENDENT_AMBULATORY_CARE_PROVIDER_SITE_OTHER): Payer: Self-pay | Admitting: Family Medicine

## 2019-03-12 ENCOUNTER — Other Ambulatory Visit: Payer: Self-pay

## 2019-03-12 DIAGNOSIS — B081 Molluscum contagiosum: Secondary | ICD-10-CM

## 2019-03-12 NOTE — Progress Notes (Signed)
  Subjective  CC: Follow Up Molluscum and Foot  [id number] [name] is a 38 y.o. female who presents today with the following problems: Molluscum contagiosum Patient presents for her third visit for molluscum removal.  At the last visit, we focused on her upper left arm.  She reports that she had some pain after the procedure but has improved.  Patient reports that she has had further spread onto her abdomen where she has 2 new lesions.  Pertinent P/F/SHx: Prediabetes, anxiety ROS: Pertinent ROS included in HPI. Objective  Physical Exam:  BP 120/88   Pulse 73   Wt 165 lb 9.6 oz (75.1 kg)   LMP 02/27/2019 (Approximate)   SpO2 99%   BMI 30.29 kg/m  General: Well-appearing Hispanic female. Skin: Upper left inner arm shows scattered areas of discoloration where previous cryotherapy was done.  There are 3 lesions that are continuing to heal from the previous curettage.  There is no obvious erythema or cellulitis of the area.  On the left forearm, patient has 3 lesions that were treated with cryotherapy last week and appear to still be healing.  Procedure: Procedure: Excision of Benign Skin Lesion Procedure Note  PRE-OP DIAGNOSIS: Molluscum contagiosum POST-OP DIAGNOSIS: Same  PROCEDURE: skin lesion excision Performing Physician: Wilber Oliphant, MD   PROCEDURE:  Punch (Size 23mm)    The area surrounding the skin lesion was prepared and draped in the usual sterile manner. The lesion was removed in the usual manner by the biopsy method noted above. Central area of molluscum removed with pickups after a shallow (approximately 2-88mm) was performed in five axillary lesions. Hemostasis was assured.   Closure:         x  Silver nitrate   Followup: The patient tolerated the procedure well without complications.  Standard post-procedure care is explained and return precautions are given.  Assessment & Plan    Problem List Items Addressed This Visit      Musculoskeletal and Integument   Molluscum  contagiosum infection    The clinic did not have the supplies for cryotherapy today but some has been ordered.  Today, we focused on the axillary area and removed 5 of the larger molluscum lesions via curettage.  Patient to follow-up for cryotherapy of remainder of lesions.        Wilber Oliphant, M.Janice.  PGY-2  Family Medicine  [phone] 03/12/2019 10:16 AM

## 2019-03-12 NOTE — Assessment & Plan Note (Signed)
The clinic did not have the supplies for cryotherapy today but some has been ordered.  Today, we focused on the axillary area and removed 5 of the larger molluscum lesions via curettage.  Patient to follow-up for cryotherapy of remainder of lesions.

## 2019-03-19 ENCOUNTER — Ambulatory Visit: Payer: Self-pay

## 2019-05-31 ENCOUNTER — Ambulatory Visit: Payer: HRSA Program | Attending: Internal Medicine

## 2019-05-31 DIAGNOSIS — Z20822 Contact with and (suspected) exposure to covid-19: Secondary | ICD-10-CM | POA: Insufficient documentation

## 2019-06-02 ENCOUNTER — Encounter: Payer: Self-pay | Admitting: Family Medicine

## 2019-06-02 LAB — NOVEL CORONAVIRUS, NAA: SARS-CoV-2, NAA: NOT DETECTED

## 2019-08-01 ENCOUNTER — Encounter: Payer: Self-pay | Admitting: Family Medicine

## 2019-08-01 ENCOUNTER — Other Ambulatory Visit: Payer: Self-pay

## 2019-08-01 ENCOUNTER — Ambulatory Visit: Payer: Self-pay | Attending: Internal Medicine

## 2019-08-01 ENCOUNTER — Telehealth (INDEPENDENT_AMBULATORY_CARE_PROVIDER_SITE_OTHER): Payer: Self-pay | Admitting: Family Medicine

## 2019-08-01 DIAGNOSIS — R079 Chest pain, unspecified: Secondary | ICD-10-CM

## 2019-08-01 DIAGNOSIS — Z20822 Contact with and (suspected) exposure to covid-19: Secondary | ICD-10-CM

## 2019-08-01 DIAGNOSIS — R0781 Pleurodynia: Secondary | ICD-10-CM

## 2019-08-01 DIAGNOSIS — R509 Fever, unspecified: Secondary | ICD-10-CM

## 2019-08-01 DIAGNOSIS — U071 COVID-19: Secondary | ICD-10-CM | POA: Insufficient documentation

## 2019-08-01 HISTORY — DX: Chest pain, unspecified: R07.9

## 2019-08-01 NOTE — Assessment & Plan Note (Addendum)
3-day history associated with dyspnea on exertion, in the setting of highly presumed COVID-19 infection.  Discussed my concerns with the patient including possibility of PE due to hypercoagulability or increased severity requiring oxygen supplementation, but reassuringly looked quite comfortable on video.  Understandably could be related to myalgias vs costochondritis, however due to these concerns, recommended she seek in person evaluation today via ED.  She was hesitant at this time, however will seek out care if worsening SOB, chest pain, or development of confusion/inability to maintain hydration.

## 2019-08-01 NOTE — Progress Notes (Signed)
[place] Tele[facility] Visit  Patient consented to have virtual visit. Method of visit: Video  Encounter participants: Patient: [name] - located in the car-just actually finished driving Provider: Patriciaann Clan - located at [facility] Others (if applicable): Leonia Corona, CMA  Chief Complaint: Fever/chills/body aches/SOB  HPI: [name] is a 39 year old female presenting via video to discuss the following:  Febrile illness: COVID tested this morning via [place], not back yet. Multiple symptoms starting about 1 week ago. Fever (100.28F) since yest, lack of smell, change in taste, headache, fatigue, and body aches. She also reports a sharp chest pain that will go through her back at all times but specifically worse with inspiration that started about 3 days ago.  Has not progressed.  Also having a nonproductive cough whenever she takes a deep breath in only for the last 1-2 days.  Has been shallow breathing due to this.  Reports dyspnea on exertion and after talking for a while, feels she can breathe "okay" at rest, but generally feels like she is getting "50% of the air".  She has been taking Tylenol, NyQuil, and DayQuil to help  Lives with mother-in-law (not feeling well either, tested for COVID yesterday without result), husband, 2 children.  Does not need a work note.  Has been trying to quarantine.  ROS: per HPI  Pertinent PMHx: Pituitary adenoma diagnosed in [id number], nonalcoholic fatty liver disease, elevated BMI, prediabetes, no known previous pulmonary concerns.  Exam:  General: Female in NAD, sitting comfortably HEENT: Mucous membranes appear moist Respiratory: Unlabored breathing, speaking full sentences without stopping, 1-2 coughs during evaluation  Assessment/Plan:  Febrile illness Clinical history is highly suspicious for COVID-19 infection, would be surprised if her test comes back negative.  Would consider retest if so.  Discussed  conservative care including cough suppressant, tea with honey, multivitamin with well-balanced diet, staying well-hydrated, warm showers, and plenty of rest (prone as often as possible). Instructed to quarantine as best as possible at home with facemask if needing to go in any shared space.  Fortunately has incentive spirometer at home, encourage frequent use of this daily.  Recommended follow-up early next week to see how she is doing or sooner if needed.   Pleuritic chest pain 3-day history associated with dyspnea on exertion, in the setting of highly presumed COVID-19 infection.  Discussed my concerns with the patient including possibility of PE due to hypercoagulability or increased severity requiring oxygen supplementation, but reassuringly looked quite comfortable on video.  Understandably could be related to myalgias vs costochondritis, however due to these concerns, recommended she seek in person evaluation today via ED.  She was hesitant at this time, however will seek out care if worsening SOB, chest pain, or development of confusion/inability to maintain hydration.    Recommended ED evaluation, follow-up early next week or sooner if needed.  Time spent during visit with patient: 12 minutes  Patriciaann Clan, DO

## 2019-08-01 NOTE — Assessment & Plan Note (Addendum)
Clinical history is highly suspicious for COVID-19 infection, would be surprised if her test comes back negative.  Would consider retest if so.  Discussed conservative care including cough suppressant, tea with honey, multivitamin with well-balanced diet, staying well-hydrated, warm showers, and plenty of rest (prone as often as possible). Instructed to quarantine as best as possible at home with facemask if needing to go in any shared space.  Fortunately has incentive spirometer at home, encourage frequent use of this daily.  Recommended follow-up early next week to see how she is doing or sooner if needed.

## 2019-08-02 ENCOUNTER — Encounter: Payer: Self-pay | Admitting: Family Medicine

## 2019-08-02 ENCOUNTER — Encounter: Payer: Self-pay | Admitting: *Deleted

## 2019-08-02 LAB — NOVEL CORONAVIRUS, NAA: SARS-CoV-2, NAA: DETECTED — AB

## 2019-08-03 ENCOUNTER — Encounter: Payer: Self-pay | Admitting: Physician Assistant

## 2019-08-03 ENCOUNTER — Telehealth: Payer: Self-pay

## 2019-08-03 ENCOUNTER — Ambulatory Visit: Payer: Self-pay

## 2019-08-03 ENCOUNTER — Other Ambulatory Visit: Payer: Self-pay | Admitting: Physician Assistant

## 2019-08-03 DIAGNOSIS — E118 Type 2 diabetes mellitus with unspecified complications: Secondary | ICD-10-CM

## 2019-08-03 DIAGNOSIS — U071 COVID-19: Secondary | ICD-10-CM

## 2019-08-03 NOTE — Telephone Encounter (Signed)
Patient calls nurse line regarding recent positive covid test. Patient reports that she continues to have back and chest pain, and pain with deep breaths. Patient states that the pain is sharp and stabbing. Also reports increased shob when walking. Patient has to take breaks during conversation to catch breath. Advised patient to be evaluated in ED. Patient verbalizes understanding and states she will go to ED.   To PCP  Talbot Grumbling, RN

## 2019-08-03 NOTE — Progress Notes (Signed)
  I connected by phone with [name] on 08/03/2019 at 11:22 AM to discuss the potential use of an new treatment for mild to moderate COVID-19 viral infection in non-hospitalized patients.  This patient is a 39 y.o. female that meets the FDA criteria for Emergency Use Authorization of bamlanivimab or casirivimab\imdevimab.  Has a (+) direct SARS-CoV-2 viral test result  Has mild or moderate COVID-19   Is ? 39 years of age and weighs ? 40 kg  Is NOT hospitalized due to COVID-19  Is NOT requiring oxygen therapy or requiring an increase in baseline oxygen flow rate due to COVID-19  Is within 10 days of symptom onset  Has at least one of the high risk factor(s) for progression to severe COVID-19 and/or hospitalization as defined in EUA.  Specific high risk criteria : Diabetes   I have spoken and communicated the following to the patient or parent/caregiver:  1. FDA has authorized the emergency use of bamlanivimab and casirivimab\imdevimab for the treatment of mild to moderate COVID-19 in adults and pediatric patients with positive results of direct SARS-CoV-2 viral testing who are 60 years of age and older weighing at least 40 kg, and who are at high risk for progressing to severe COVID-19 and/or hospitalization.  2. The significant known and potential risks and benefits of bamlanivimab and casirivimab\imdevimab, and the extent to which such potential risks and benefits are unknown.  3. Information on available alternative treatments and the risks and benefits of those alternatives, including clinical trials.  4. Patients treated with bamlanivimab and casirivimab\imdevimab should continue to self-isolate and use infection control measures (e.g., wear mask, isolate, social distance, avoid sharing personal items, clean and disinfect "high touch" surfaces, and frequent handwashing) according to CDC guidelines.   5. The patient or parent/caregiver has the option to accept or refuse  bamlanivimab or casirivimab\imdevimab .  After reviewing this information with the patient, The patient agreed to proceed with receiving the bamlanimivab infusion and will be provided a copy of the Fact sheet prior to receiving the infusion.   Sx onset 07/27/19. Set up for infusion 08/04/19 @ 10:30am.   Angelena Form PA-C 08/03/2019 11:22 AM

## 2019-08-04 ENCOUNTER — Ambulatory Visit (HOSPITAL_COMMUNITY)
Admission: RE | Admit: 2019-08-04 | Discharge: 2019-08-04 | Disposition: A | Discharge status: Home / Self Care | Payer: HRSA Program | Source: Ambulatory Visit | Attending: Pulmonary Disease | Admitting: Pulmonary Disease

## 2019-08-04 DIAGNOSIS — U071 COVID-19: Secondary | ICD-10-CM | POA: Diagnosis present

## 2019-08-04 DIAGNOSIS — E118 Type 2 diabetes mellitus with unspecified complications: Secondary | ICD-10-CM | POA: Insufficient documentation

## 2019-08-04 MED ORDER — SODIUM CHLORIDE 0.9 % IV SOLN
700.0000 mg | Freq: Once | INTRAVENOUS | Status: AC
  Administered 2019-08-04: 700 mg via INTRAVENOUS
  Filled 2019-08-04: qty 700

## 2019-08-04 MED ORDER — FAMOTIDINE IN NACL 20-0.9 MG/50ML-% IV SOLN: 20.0000 mg | Freq: Once | INTRAVENOUS | Status: DC | PRN

## 2019-08-04 MED ORDER — METHYLPREDNISOLONE SODIUM SUCC 125 MG IJ SOLR: 125.0000 mg | Freq: Once | INTRAMUSCULAR | Status: DC | PRN

## 2019-08-04 MED ORDER — ALBUTEROL SULFATE HFA 108 (90 BASE) MCG/ACT IN AERS: 2.0000 | INHALATION_SPRAY | Freq: Once | RESPIRATORY_TRACT | Status: DC | PRN

## 2019-08-04 MED ORDER — EPINEPHRINE 0.3 MG/0.3ML IJ SOAJ: 0.3000 mg | Freq: Once | INTRAMUSCULAR | Status: DC | PRN

## 2019-08-04 MED ORDER — SODIUM CHLORIDE 0.9 % IV SOLN: INTRAVENOUS | Status: DC | PRN

## 2019-08-04 MED ORDER — DIPHENHYDRAMINE HCL 50 MG/ML IJ SOLN: 50.0000 mg | Freq: Once | INTRAMUSCULAR | Status: DC | PRN

## 2019-08-04 NOTE — Discharge Instructions (Signed)

## 2019-08-04 NOTE — Progress Notes (Signed)
  Diagnosis: COVID-19  Physician: Dr. Wright  Procedure: [facility] Med: bamlanivimab infusion - Provided patient with bamlanimivab fact sheet for patients, parents and caregivers prior to infusion.  Complications: No immediate complications noted.  Discharge: Discharged home   [name] 08/04/2019  

## 2019-08-06 ENCOUNTER — Telehealth: Payer: Self-pay

## 2019-08-06 NOTE — Telephone Encounter (Signed)
Patient left message on nurse line regarding new onset dizziness and elevated BS. Patient reports that she has been experiencing slight dizziness and blood sugar of 229 this AM. Patient does however report that she drank about 8 ounces of orange juice this morning. Advised patient that juices can elevated blood sugar, and to drink plenty of water in place of other fluids. Instructed patient to change positions slowly and to increase water intake as patient could be experiencing some dehydration. Patient also reports slightly darker urine.   Patient already scheduled virtual visit with provider to discuss symptoms. ED precautions given. Patient verbalizes understanding.   To PCP  Talbot Grumbling, RN

## 2019-08-07 ENCOUNTER — Telehealth (INDEPENDENT_AMBULATORY_CARE_PROVIDER_SITE_OTHER): Payer: HRSA Program | Admitting: Family Medicine

## 2019-08-07 ENCOUNTER — Other Ambulatory Visit: Payer: Self-pay

## 2019-08-07 VITALS — BP 133/75

## 2019-08-07 DIAGNOSIS — U071 COVID-19: Secondary | ICD-10-CM

## 2019-08-07 DIAGNOSIS — R42 Dizziness and giddiness: Secondary | ICD-10-CM

## 2019-08-07 NOTE — Progress Notes (Signed)
[place] Telemedicine Visit  Patient consented to have virtual visit. Method of visit: Video  Encounter participants: Patient: [name] - located at Home Provider: Danna Hefty - located at [facility]  Others (if applicable): None  Chief Complaint: Dizziness   HPI: Patient presenting today for new onset dizziness x1 day.  She was diagnosed with Covid 19 on 3/10.  She received monoclonal antibody infusion on 3/13. She notes her symptoms are improving, only occasional SOB that is improving.  She notes her symptoms of lightheadedness started yesterday. The symptoms were occurring when she was sitting and walking. She notes her blood sugar 229 1-hour after she ate. Her BP was 133/70 at that time. She also feels really thirsty. She notes she has a history of vertigo and this is different. Today she feels better, isnt having the dizziness. She notes she feels really thirsty. She denies excessive urination. She is tolerating PO.  Does note that she isnt peeing as often and her urine is a little dark.  ROS: per HPI   Pertinent PMHx: prediabetes, vertigo   Exam:  Gen: appears tired  HEENT: lips visibly dry  Respiratory: breathing comfortably on room air, speaking in full sentences  Assessment/Plan:  Lightheadedness  COVID-19 Infection: Episodic lightheadedness in the setting of acute COVID-19 infection, decreased urination and dry mouth concerning for dehydration, as well as recent monoclonal antibody infusion in the 48 hours prior. She has had improvement in her symptoms since yesterday which is reassuring.  Blood sugar elevated and blood pressure normotensive thus unlikely hypoglycemia or hypotension as cause.  Recommended patient focus on maintaining adequate hydration today of at least 6-8 bottles of water.  Recommended she avoid caffeine-based drinks such as tea, coffee, sodas.  In regards to her elevated blood sugars, she has a history of prediabetes with an A1c  of 5.4 in 2020.  Expect elevated sugars secondary to acute infection.  Do not recommend acutely treating this at this time.  If continues to remain elevated once recovered, then consider further evaluation by PCP. Recommend patient monitor back pain and urination. If continues to worsen, despite adequate hydration then consider visit to urgent care to rule out stone/urinary infection.  Time spent during visit with patient: 15 minutes  Mina Marble, [place], PGY2 08/07/19

## 2019-08-16 ENCOUNTER — Other Ambulatory Visit: Payer: Self-pay | Admitting: Family Medicine

## 2019-08-27 ENCOUNTER — Ambulatory Visit: Payer: Self-pay

## 2019-08-31 ENCOUNTER — Other Ambulatory Visit: Payer: Self-pay

## 2019-08-31 ENCOUNTER — Ambulatory Visit (INDEPENDENT_AMBULATORY_CARE_PROVIDER_SITE_OTHER): Payer: HRSA Program | Admitting: Family Medicine

## 2019-08-31 ENCOUNTER — Encounter: Payer: Self-pay | Admitting: Family Medicine

## 2019-08-31 VITALS — BP 128/78 | HR 73 | Wt 174.0 lb

## 2019-08-31 DIAGNOSIS — U071 COVID-19: Secondary | ICD-10-CM | POA: Diagnosis not present

## 2019-08-31 DIAGNOSIS — J302 Other seasonal allergic rhinitis: Secondary | ICD-10-CM

## 2019-08-31 NOTE — Assessment & Plan Note (Signed)
Patient reports worsening of seasonal allergy symptoms including swelling of eyes, sneezing.  She is still having shortness of breath from COVID-19 and reports she has not been taking Zyrtec or Flonase daily. -Daily Zyrtec -Daily Flonase -Monitor for symptom improvement

## 2019-08-31 NOTE — Assessment & Plan Note (Signed)
Patient diagnosed with COVID-19 approximately 1 month ago.  Is out of the quarantine window but is still reporting mild shortness of breath especially with exertion as well as back pain.  Reports that all of this is stable from when she was sick.  Patient received bamlanivimab infusion while sick.  Ambulated well without desaturations, no chest wall tenderness, no chest pain -CBC to assess for anemia -CMP -BNP -Monitor for worsening signs and symptoms -Strict return precautions given -Follow-up in 3 to 4 weeks to assess improvement -Daily Zyrtec -Daily Flonase

## 2019-08-31 NOTE — Progress Notes (Signed)
    SUBJECTIVE:   CHIEF COMPLAINT / HPI:  Follow-up after Covid along with back pain Patient reports that she has had continuation of shortness of breath since having Covid.  She notes the shortness of breath on exertion but not at rest.  Especially when she is carrying groceries in from the car.  Denies any chest pain.  Reports that this is stable from when she had Covid.  She also notices that she feels like she has a bubble in her throat on occasion that she first noted while she was sick with COVID-19 but reports that it is still present.  Patient also reports that she was taking medications to help with her symptoms but has since discontinued them.  Patient used to take daily Zyrtec with occasional Flonase but reports she has not been taking that regularly either.  Patient acknowledges worsening allergy symptoms as well.  Patient has questions regarding if she needs lab work because of her bamlanivimab infusion that she received.  PERTINENT  PMH / PSH: COVID-19 infection  OBJECTIVE:   BP 128/78   Pulse 73   Wt 174 lb (78.9 kg)   LMP 08/21/2019 (Exact Date)   SpO2 98%   BMI 31.83 kg/m   General: Well-appearing, no acute distress Cardiac: Regular rate and rhythm, no murmurs appreciated Respiratory: Lungs were clear to auscultation bilaterally in all lung fields.  Normal work of breathing.  Ambulated with pulse ox and pulse ox remained at 99.  No chest tenderness. Abdomen: Soft, nontender, positive bowel sounds ASSESSMENT/PLAN:   COVID-19 virus infection Patient diagnosed with COVID-19 approximately 1 month ago.  Is out of the quarantine window but is still reporting mild shortness of breath especially with exertion as well as back pain.  Reports that all of this is stable from when she was sick.  Patient received bamlanivimab infusion while sick.  Ambulated well without desaturations, no chest wall tenderness, no chest pain -CBC to assess for anemia -CMP -BNP -Monitor for worsening  signs and symptoms -Strict return precautions given -Follow-up in 3 to 4 weeks to assess improvement -Daily Zyrtec -Daily Flonase  Seasonal allergies Patient reports worsening of seasonal allergy symptoms including swelling of eyes, sneezing.  She is still having shortness of breath from COVID-19 and reports she has not been taking Zyrtec or Flonase daily. -Daily Zyrtec -Daily Flonase -Monitor for symptom improvement     Gifford Shave, MD [place]

## 2019-08-31 NOTE — Patient Instructions (Signed)
It was a pleasure to meet you today.  I am going to get some lab work on you just to evaluate for other causes of your shortness of breath but I feel like it is most likely related to your COVID-19.  This may take some time to recover but should eventually come back to baseline.  I would like for you to take your Zyrtec daily as well as use of Flonase daily.  I would also like for you to follow-up with our clinic in 3 weeks to check on your symptoms.  If you have any sudden shortness of breath, chest pain please seek medical attention immediately.  Have a wonderful afternoon!

## 2019-09-01 LAB — CBC
Hematocrit: 34.7 % (ref 34.0–46.6)
Hemoglobin: 11.9 g/dL (ref 11.1–15.9)
MCH: 31.5 pg (ref 26.6–33.0)
MCHC: 34.3 g/dL (ref 31.5–35.7)
MCV: 92 fL (ref 79–97)
Platelets: 299 10*3/uL (ref 150–450)
RBC: 3.78 x10E6/uL (ref 3.77–5.28)
RDW: 12.4 % (ref 11.7–15.4)
WBC: 7.3 10*3/uL (ref 3.4–10.8)

## 2019-09-01 LAB — COMPREHENSIVE METABOLIC PANEL
ALT: 35 IU/L — ABNORMAL HIGH (ref 0–32)
AST: 23 IU/L (ref 0–40)
Albumin/Globulin Ratio: 1.5 (ref 1.2–2.2)
Albumin: 4.3 g/dL (ref 3.8–4.8)
Alkaline Phosphatase: 97 IU/L (ref 39–117)
BUN/Creatinine Ratio: 17 (ref 9–23)
BUN: 15 mg/dL (ref 6–20)
Bilirubin Total: <0.2 mg/dL (ref 0.0–1.2)
CO2: 22 mmol/L (ref 20–29)
Calcium: 9.8 mg/dL (ref 8.7–10.2)
Chloride: 99 mmol/L (ref 96–106)
Creatinine, Ser: 0.88 mg/dL (ref 0.57–1.00)
GFR calc Af Amer: 96 mL/min/{1.73_m2} (ref >59)
GFR calc non Af Amer: 83 mL/min/{1.73_m2} (ref >59)
Globulin, Total: 2.8 g/dL (ref 1.5–4.5)
Glucose: 149 mg/dL — ABNORMAL HIGH (ref 65–99)
Potassium: 4.2 mmol/L (ref 3.5–5.2)
Sodium: 137 mmol/L (ref 134–144)
Total Protein: 7.1 g/dL (ref 6.0–8.5)

## 2019-09-01 LAB — BRAIN NATRIURETIC PEPTIDE: BNP: 4.6 pg/mL (ref 0.0–100.0)

## 2019-09-18 ENCOUNTER — Ambulatory Visit: Payer: Self-pay | Admitting: Family Medicine

## 2020-01-23 ENCOUNTER — Other Ambulatory Visit: Payer: Self-pay | Admitting: Family Medicine

## 2020-01-23 DIAGNOSIS — R7303 Prediabetes: Secondary | ICD-10-CM

## 2020-01-27 ENCOUNTER — Encounter: Payer: Self-pay | Admitting: Family Medicine

## 2020-01-27 DIAGNOSIS — R7303 Prediabetes: Secondary | ICD-10-CM

## 2020-01-29 MED ORDER — METFORMIN HCL ER 500 MG PO TB24: 500.0000 mg | ORAL_TABLET | Freq: Every day | ORAL | 3 refills | Status: DC

## 2020-02-12 ENCOUNTER — Other Ambulatory Visit (HOSPITAL_COMMUNITY)
Admission: RE | Admit: 2020-02-12 | Discharge: 2020-02-12 | Disposition: A | Discharge status: Home / Self Care | Payer: Self-pay | Source: Ambulatory Visit | Attending: Family Medicine | Admitting: Family Medicine

## 2020-02-12 ENCOUNTER — Ambulatory Visit (INDEPENDENT_AMBULATORY_CARE_PROVIDER_SITE_OTHER): Payer: Self-pay | Admitting: Family Medicine

## 2020-02-12 ENCOUNTER — Other Ambulatory Visit: Payer: Self-pay

## 2020-02-12 ENCOUNTER — Encounter: Payer: Self-pay | Admitting: Family Medicine

## 2020-02-12 VITALS — BP 128/74 | HR 72 | Ht 62.0 in | Wt 174.0 lb

## 2020-02-12 DIAGNOSIS — K760 Fatty (change of) liver, not elsewhere classified: Secondary | ICD-10-CM

## 2020-02-12 DIAGNOSIS — Z113 Encounter for screening for infections with a predominantly sexual mode of transmission: Secondary | ICD-10-CM | POA: Insufficient documentation

## 2020-02-12 DIAGNOSIS — Z6830 Body mass index (BMI) 30.0-30.9, adult: Secondary | ICD-10-CM

## 2020-02-12 DIAGNOSIS — S161XXA Strain of muscle, fascia and tendon at neck level, initial encounter: Secondary | ICD-10-CM

## 2020-02-12 DIAGNOSIS — R7303 Prediabetes: Secondary | ICD-10-CM

## 2020-02-12 DIAGNOSIS — J040 Acute laryngitis: Secondary | ICD-10-CM

## 2020-02-12 DIAGNOSIS — E6609 Other obesity due to excess calories: Secondary | ICD-10-CM

## 2020-02-12 LAB — POCT GLYCOSYLATED HEMOGLOBIN (HGB A1C): Hemoglobin A1C: 5.6 % (ref 4.0–5.6)

## 2020-02-12 MED ORDER — CYCLOBENZAPRINE HCL 5 MG PO TABS: 5.0000 mg | ORAL_TABLET | Freq: Three times a day (TID) | ORAL | 1 refills | Status: DC | PRN

## 2020-02-12 NOTE — Assessment & Plan Note (Signed)
AST mildly elevated in 08/31/19. Previously normal. Will continue to monitor.

## 2020-02-12 NOTE — Patient Instructions (Signed)
Use flonase twice a day, 1 spray in each nostril for 2 weeks. Continue to use the zyrtec as well. If you don't have any improvement in your voice, call us and we can send you to ENT.   I will also let you know about your labs.

## 2020-02-12 NOTE — Progress Notes (Signed)
    SUBJECTIVE:   CHIEF COMPLAINT / HPI:   Laryngitis August 4th - lost voice for the first time. Goes back and forth Usually happens around the time she has her period x 2 months   Patient has allergies. When allergies are worse, ringing is worse in her ears.  Flonase PRN and zyrtec. Exacerbated with talking. If she stops talking, her voice comes back, and then worsens if she talks more.  Associated with muscle pain in her neck that she had with COVID in March. Then again had after her second COVID shot.  Denies fevers, vomiting, sore throat. No recent trauma to neck   PERTINENT  PMH / PSH: Hx of multiple somatic complaints   OBJECTIVE:   BP 128/74   Pulse 72   Ht 5\' 2"  (1.575 m)   Wt 174 lb (78.9 kg)   LMP 02/09/2020 (Exact Date)   SpO2 98%   BMI 31.83 kg/m   General: Well-appearing female, no acute distress HEENT: Bilateral TMs without  fluid levels and without surrounding erythema.  Oropharynx clear without any tonsillar exudates.  Nares patent bilaterally.  Boggy turbinates. Neck: full ROM. 5/5 strength. TTP of b/l SCM and lateral cervical area. No spinous tenderness.   ASSESSMENT/PLAN:   Fatty liver disease, nonalcoholic AST mildly elevated in 08/31/19. Previously normal. Will continue to monitor.   Prediabetes Repeated A1c today.  5.6 from 5.4.  Patient would like to continue taking her Metformin.  I have refilled for her.  Next A1c in 1 year.  Laryngitis Patient's clinical presentation is consistent with a viral laryngitis.  Can also consider GERD however, no history of this.  Physical exam mostly benign except for some boggy turbinates.  Patient also complaining of some pain in her neck as well that is made it difficult for her to sleep.  Bilateral sternocleidomastoid tenderness on physical exam.  Negative Kernig sign.  Will provide Flexeril.  If no relief, patient should return.  No fever.  Full range of movement of neck.  Patient has been taking as needed Flonase and  Zyrtec.  Recommended that patient take both of these medications daily to see if there is any improvement.  If there is no improvement, patient should return for possible ENT follow-up for direct visualization to rule out any structural causes or cancer.  Screening examination for STD (sexually transmitted disease) Patient denies any symptoms at this time.  She is currently on her menstrual cycle and does not want to do a pelvic exam.  Will obtain urine cytology.    Wilber Oliphant, MD [place]

## 2020-02-13 LAB — URINE CYTOLOGY ANCILLARY ONLY
Chlamydia: NEGATIVE
Comment: NEGATIVE
Comment: NORMAL
Neisseria Gonorrhea: NEGATIVE

## 2020-02-13 LAB — RPR: RPR Ser Ql: NONREACTIVE

## 2020-02-13 LAB — TSH: TSH: 1.28 u[IU]/mL (ref 0.450–4.500)

## 2020-02-13 LAB — HIV ANTIBODY (ROUTINE TESTING W REFLEX): HIV Screen 4th Generation wRfx: NONREACTIVE

## 2020-02-15 ENCOUNTER — Encounter: Payer: Self-pay | Admitting: Family Medicine

## 2020-02-15 DIAGNOSIS — J040 Acute laryngitis: Secondary | ICD-10-CM | POA: Insufficient documentation

## 2020-02-15 DIAGNOSIS — Z113 Encounter for screening for infections with a predominantly sexual mode of transmission: Secondary | ICD-10-CM | POA: Insufficient documentation

## 2020-02-15 HISTORY — DX: Acute laryngitis: J04.0

## 2020-02-15 NOTE — Assessment & Plan Note (Signed)
Repeated A1c today.  5.6 from 5.4.  Patient would like to continue taking her Metformin.  I have refilled for her.  Next A1c in 1 year.

## 2020-02-15 NOTE — Assessment & Plan Note (Signed)
Patient denies any symptoms at this time.  She is currently on her menstrual cycle and does not want to do a pelvic exam.  Will obtain urine cytology.

## 2020-02-15 NOTE — Assessment & Plan Note (Addendum)
Patient's clinical presentation is consistent with a viral laryngitis.  Can also consider GERD however, no history of this.  Physical exam mostly benign except for some boggy turbinates.  Patient also complaining of some pain in her neck as well that is made it difficult for her to sleep.  Bilateral sternocleidomastoid tenderness on physical exam.  Negative Kernig sign.  Will provide Flexeril.  If no relief, patient should return.  No fever.  Full range of movement of neck.  Patient has been taking as needed Flonase and Zyrtec.  Recommended that patient take both of these medications daily to see if there is any improvement.  If there is no improvement, patient should return for possible ENT follow-up for direct visualization to rule out any structural causes or cancer.

## 2020-05-19 ENCOUNTER — Ambulatory Visit (HOSPITAL_COMMUNITY)
Admission: RE | Admit: 2020-05-19 | Discharge: 2020-05-19 | Disposition: A | Discharge status: Home / Self Care | Payer: Self-pay | Source: Ambulatory Visit | Attending: Family Medicine | Admitting: Family Medicine

## 2020-05-19 ENCOUNTER — Other Ambulatory Visit: Payer: Self-pay

## 2020-05-19 VITALS — BP 142/87 | HR 85 | Temp 98.7°F | Resp 20

## 2020-05-19 DIAGNOSIS — R490 Dysphonia: Secondary | ICD-10-CM

## 2020-05-19 DIAGNOSIS — J069 Acute upper respiratory infection, unspecified: Secondary | ICD-10-CM

## 2020-05-19 MED ORDER — PREDNISONE 20 MG PO TABS: 40.0000 mg | ORAL_TABLET | Freq: Every day | ORAL | 0 refills | Status: DC

## 2020-05-19 NOTE — ED Provider Notes (Signed)
[place]    CSN: [id number] Arrival date & time: 05/19/20  1755      History   Chief Complaint No chief complaint on file.   HPI [name] is a 39 y.o. female.   Here today with about a week of sinus pressure and drainage, chest tightness and soreness, sore throat, hoarseness. Denies fever, chills, SOB, significant cough, abdominal pain, N/V/D. States the hoarseness has been off and on for months. Does have a hx of thyroid nodules, awaiting seeing an ENT for this. So far taking a few of her daughter's prednisone pills which did help while taking and taking consistent allergy regimen with antihistamines and flonase regularly. Son sick with similar sxs now. Took COVID test yesterday, awaiting results.      Past Medical History:  Diagnosis Date  . Allergy   . Anxiety   . ANXIETY 12/31/2008   Qualifier: Diagnosis of  By: Nadara Eaton  MD, Mickel Baas    . Breast mass in female 10/06/2017  . Complication of anesthesia    Problem waking up once and a headache  . Condyloma acuminatum 07/21/2006   Qualifier: History of  By: Oneal Grout MD, Manuela Schwartz    . Depression   . Diabetes type 2, controlled ([place]) 04/02/2014  . Family history of anesthesia complication    Mother had problem waking up  . Hx gestational diabetes   . Menorrhagia 07/03/2014  . Spinal headache   . Thyroid fullness 04/02/2014    Patient Active Problem List   Diagnosis Date Noted  . Screening examination for STD (sexually transmitted disease) 02/15/2020  . Laryngitis 02/15/2020  . Muscle strain of lower leg, right, initial encounter 03/05/2019  . Molluscum contagiosum infection 12/01/2018  . 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 Hypochondriasis 08/19/2014  . Cystic thyroid nodule 07/03/2014  . Prediabetes 04/02/2014  . Seasonal allergies 10/24/2012  . Multiple food allergies 10/24/2012  .  Obesity, unspecified 07/23/2012  . Fatty liver disease, nonalcoholic 0000000  . 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 CESAREAN SECTION    . CHOLECYSTECTOMY N/A 09/06/2013   Procedure: LAPAROSCOPIC CHOLECYSTECTOMY;  Surgeon: Harl Bowie, MD;  Location: [facility];  Service: General;  Laterality: N/A;  . NASAL SINUS SURGERY  2003  . TUBAL LIGATION      OB History    Gravida  3   Para      Term      Preterm      AB      Living  3     SAB      IAB      Ectopic      Multiple      Live Births  3            Home Medications    Prior to Admission medications   Medication Sig Start Date End Date Taking? Authorizing Provider  cetirizine (ZYRTEC) 10 MG tablet Take 1 tablet (10 mg total) by mouth daily. 12/04/18  Yes Wilber Oliphant, MD  metFORMIN (GLUCOPHAGE-XR) 500 MG 24 hr tablet Take 1 tablet (500 mg total) by mouth daily with breakfast. 01/29/20  Yes Wilber Oliphant, MD  predniSONE (DELTASONE) 20 MG tablet Take 2 tablets (40 mg total) by mouth daily with breakfast. 05/19/20  Yes Volney American, PA-C  cyclobenzaprine (FLEXERIL) 5 MG tablet Take 1 tablet (  5 mg total) by mouth 3 (three) times daily as needed for muscle spasms. 02/12/20   Melene Plan, MD  fluticasone (FLONASE) 50 MCG/ACT nasal spray Place 2 sprays into both nostrils daily. 03/09/18   Melene Plan, MD  triamcinolone cream (KENALOG) 0.1 % APPLY 1 APPLICATION TOPICALLY TO AFFECTED AREA TWICE DAILY 08/18/19   Melene Plan, MD    Family History Family History  Problem Relation Age of Onset  . Hypertension Mother   . Hyperlipidemia Mother   . Cancer Mother        cervical  . Diabetes Other   . Breast cancer Paternal Aunt     Social History Social History   Tobacco Use  . Smoking status: Never Smoker  . Smokeless tobacco: Never Used  Vaping Use  . Vaping Use: Never used  Substance Use Topics  . Alcohol use: No    Comment: hasn't had a  drink since 07/07/2013  . Drug use: No     Allergies   Amoxicillin and Penicillins   Review of Systems Review of Systems PER HPI    Physical Exam Triage Vital Signs ED Triage Vitals  Enc Vitals Group     BP 05/19/20 1853 (!) 142/87     Pulse Rate 05/19/20 1853 85     Resp 05/19/20 1853 20     Temp 05/19/20 1853 98.7 F (37.1 C)     Temp Source 05/19/20 1853 Oral     SpO2 05/19/20 1853 98 %     Weight --      Height --      Head Circumference --      Peak Flow --      Pain Score 05/19/20 1854 5     Pain Loc --      Pain Edu? --      Excl. in GC? --    No data found.  Updated Vital Signs BP (!) 142/87 (BP Location: Right Arm)   Pulse 85   Temp 98.7 F (37.1 C) (Oral)   Resp 20   SpO2 98%   Visual Acuity Right Eye Distance:   Left Eye Distance:   Bilateral Distance:    Right Eye Near:   Left Eye Near:    Bilateral Near:     Physical Exam Vitals and nursing note reviewed.  Constitutional:      Appearance: Normal appearance. She is not ill-appearing.  HENT:     Head: Atraumatic.     Right Ear: Tympanic membrane normal.     Left Ear: Tympanic membrane normal.     Nose: Rhinorrhea present.     Mouth/Throat:     Mouth: Mucous membranes are moist.     Pharynx: Posterior oropharyngeal erythema present.  Eyes:     Extraocular Movements: Extraocular movements intact.     Conjunctiva/sclera: Conjunctivae normal.  Cardiovascular:     Rate and Rhythm: Normal rate and regular rhythm.     Heart sounds: Normal heart sounds.  Pulmonary:     Effort: Pulmonary effort is normal. No respiratory distress.     Breath sounds: Normal breath sounds. No wheezing or rales.  Abdominal:     General: Bowel sounds are normal. There is no distension.     Palpations: Abdomen is soft.     Tenderness: There is no abdominal tenderness. There is no guarding.  Musculoskeletal:        General: Normal range of motion.     Cervical back: Normal range of motion  and neck supple.   Lymphadenopathy:     Cervical: No cervical adenopathy.  Skin:    General: Skin is warm and dry.  Neurological:     Mental Status: She is alert and oriented to person, place, and time.  Psychiatric:        Mood and Affect: Mood normal.        Thought Content: Thought content normal.        Judgment: Judgment normal.      UC Treatments / Results  Labs (all labs ordered are listed, but only abnormal results are displayed) Labs Reviewed - No data to display  EKG   Radiology No results found.  Procedures Procedures (including critical care time)  Medications Ordered in UC Medications - No data to display  Initial Impression / Assessment and Plan / UC Course  I have reviewed the triage vital signs and the nursing notes.  Pertinent labs & imaging results that were available during my care of the patient were reviewed by me and considered in my medical decision making (see chart for details).     Suspect viral URI in addition to her chronic allergic rhinitis, hoarseness which may be related to thyroid issues or chronic drainage. Discussed prednisone burst, continued allergy regimen, close ENT f/u, and awaiting her COVID results. Isolation, return precautions reviewed. Exam overall benign and reassuring today, O2 saturation 98%.    Final Clinical Impressions(s) / UC Diagnoses   Final diagnoses:  Hoarseness of voice  Viral URI   Discharge Instructions   None    ED Prescriptions    Medication Sig Dispense Auth. Provider   predniSONE (DELTASONE) 20 MG tablet Take 2 tablets (40 mg total) by mouth daily with breakfast. 10 tablet Volney American, [place]     PDMP not reviewed this encounter.   Volney American, [place] 05/19/20 2028

## 2020-05-19 NOTE — ED Triage Notes (Signed)
Pt c/o sinus pressure, SOB, pharyngitis, sore throat, upper chest x 1 wk.

## 2020-05-20 ENCOUNTER — Encounter: Payer: Self-pay | Admitting: Family Medicine

## 2020-05-22 ENCOUNTER — Ambulatory Visit (INDEPENDENT_AMBULATORY_CARE_PROVIDER_SITE_OTHER): Payer: Self-pay | Admitting: Family Medicine

## 2020-05-22 ENCOUNTER — Encounter: Payer: Self-pay | Admitting: Family Medicine

## 2020-05-22 ENCOUNTER — Other Ambulatory Visit: Payer: Self-pay

## 2020-05-22 VITALS — BP 118/80 | HR 76 | Wt 169.0 lb

## 2020-05-22 DIAGNOSIS — R490 Dysphonia: Secondary | ICD-10-CM

## 2020-05-22 DIAGNOSIS — B9789 Other viral agents as the cause of diseases classified elsewhere: Secondary | ICD-10-CM

## 2020-05-22 DIAGNOSIS — J040 Acute laryngitis: Secondary | ICD-10-CM

## 2020-05-22 HISTORY — DX: Dysphonia: R49.0

## 2020-05-22 NOTE — Progress Notes (Signed)
    SUBJECTIVE:   CHIEF COMPLAINT / HPI:   Patient was recently seen at urgent care with symptoms of sinus pressure/drainage, chest tightness and soreness, sore throat, and voice hoarseness (which has been off and on for months).  She was treated for allergies with cetirizine and Flonase, and she was also prescribed a 5-day steroid burst which she is currently still completing. Her son has similar symptoms starting on 12/26 and recently tested positive for Covid.  He had 1 day of fever and is still having some sore throat.  She herself is fully vaccinated against COVID with Pfizer (too early to receive booster) and also had a Covid infection in March of this year.  She feels her symptoms are similar to when she had her Covid infection. States she had a negative PCR test on 12/26 and a negative rapid antigen test yesterday 12/29. She had a repeat PCR test yesterday 12/29 as well, pending. Family has been quarantining in separate rooms, though some still going to work.  She is primarily concerned about her voice hoarseness and throat tightness.  She states that she has had some intermittent voice hoarseness (5+ episodes) since May/June of this year.  However, she recently has had more voice hoarseness over the past 2 weeks.  About 2 weekends ago, she notes that she lost her voice the day after a party in which she was laughing a lot.  Her voice never fully recovered and she has had loss of voice on 3 other separate days over the past 2 weeks. Voice is better now, but she feels it has not fully recovered. Denies fever, cough, shortness of breath.  PERTINENT  PMH / PSH: cystic thyroid nodule, obesity  OBJECTIVE:   BP 118/80   Pulse 76   Wt 169 lb (76.7 kg)   LMP 05/01/2020   SpO2 98%   BMI 30.91 kg/m   General: Obese middle-aged woman, NAD HEENT: posterior oropharynx mildly erythematous without exudate or lesions Neck: anterior cervical lymphadenopathy (1 node on right larger than 1 node on  left) CV: RRR, no murmurs Pulm: CTAB, no wheezes or rales  ASSESSMENT/PLAN:   Voice hoarseness Though she has had a known Covid exposure, she has had two negative covid tests with a second PCR test pending. No need to re-test at this time. Given the chronicity of her voice hoarseness, it is possible that she has some underlying vocal cord dysfunction worse with superimposed viral laryngitis. Will send referral to ENT given chronicity of symptoms. - Tylenol, NSAIDs for pain - can complete steroid burst prescribed at urgent care - referral to ENT     Littie Deeds, MD [facility]

## 2020-05-22 NOTE — Patient Instructions (Addendum)
It was nice seeing you today!  I think it is most likely that you have a viral infection that may be affecting your vocal cords. You can take Tylenol or ibuprofen for pain. You can complete your steroid course.  Since your voice issues have been going on for some time, I am sending you a referral to ENT.  Please follow-up with your primary care doctor regarding your thyroid nodule. Your last thyroid test was normal.  Stay well, Janice Deeds, MD [facility] [phone]    Laringitis Laryngitis La laringitis es la irritacin e hinchazn (inflamacin) de las cuerdas vocales. Esta afeccin causa sntomas como los siguientes:  Cambios en la voz. Puede sonar baja y ronca.  Prdida de la voz.  Tos.  Dolor de Advertising copywriter.  Sequedad en garganta.  Congestin nasal. Segn la causa, esta afeccin puede desaparecer despus de un breve perodo de tiempo o puede durar ms de 3 semanas. Con frecuencia, el tratamiento incluye descansar la voz y usar medicamentos para Conservation officer, nature. Siga estas indicaciones en su casa: Medicamentos  [employer] de venta libre y los recetados solamente como se lo haya indicado el mdico.  Si le recetaron un antibitico, tmelo como se lo haya indicado el mdico. No deje de tomarlo aunque comience a sentirse mejor. Instrucciones generales  Hable lo menos posible. Tambin evite susurrar.  Escriba en vez de hablar. Hgalo hasta que su voz vuelva a la normalidad.  Beba suficiente lquido para Radio producer pis (orina) de color amarillo plido.  Inhale aire hmedo. Use un humidificador si vive en un clima seco.  No consuma ningn producto que contenga nicotina o tabaco, como cigarrillos y cigarrillos electrnicos. Si necesita ayuda para dejar de fumar, consulte al mdico. Comunquese con un mdico si:  Tiene fiebre.  El dolor [place].  Los sntomas no mejoran en el trmino de 2 semanas. Solicite ayuda de inmediato  si:  Tose y [employer].  Tiene dificultad para tragar.  Tiene dificultad para respirar. Resumen  La laringitis es la inflamacin de las cuerdas vocales.  Esta afeccin causa que la voz suene baja y ronca.  Descanse la voz hablando lo menos posible. Tambin evite susurrar. Esta informacin no tiene Theme park manager el consejo del mdico. Asegrese de hacerle al mdico cualquier pregunta que tenga. Document Revised: 07/04/2017 Document Reviewed: 07/04/2017 Elsevier Patient Education  2020 [employer].

## 2020-05-22 NOTE — Assessment & Plan Note (Addendum)
Though she has had a known Covid exposure, she has had two negative covid tests with a second PCR test pending. No need to re-test at this time. Given the chronicity of her voice hoarseness, it is possible that she has some underlying vocal cord dysfunction worse with superimposed viral laryngitis. Will send referral to ENT given chronicity of symptoms. - Tylenol, NSAIDs for pain - can complete steroid burst prescribed at urgent care - referral to ENT

## 2020-06-11 ENCOUNTER — Encounter: Payer: Self-pay | Admitting: Family Medicine

## 2020-06-13 ENCOUNTER — Encounter: Payer: Self-pay | Admitting: Family Medicine

## 2020-06-13 NOTE — Telephone Encounter (Signed)
Called patient regarding her mychart message.   She reports that she received ten days of prednisone at her last appt in late December and has been taking the prednisone PRN for voice hoarseness, which helps clear up her voice. Her voice sounds normal over the phone today.  Recommended that patient follow up with ENT and that prednisone would not be indicated at this time. Denies any SOB or difficulty breathing.   Headaches:  Reports severe headaches recently. Some so bad that it is affecting her sleep and she took one of her daughters hydrocodone pain pills. Recommended that patient be seen, especially with hx of pituitary adenoma. Scheduled in ATC 06/17/20 AM. Please make sure to check visual fields.   Wilber Oliphant, M.D.  2:12 PM 06/13/2020

## 2020-06-17 ENCOUNTER — Ambulatory Visit: Payer: Self-pay

## 2020-06-18 ENCOUNTER — Other Ambulatory Visit: Payer: Self-pay

## 2020-06-18 ENCOUNTER — Ambulatory Visit (INDEPENDENT_AMBULATORY_CARE_PROVIDER_SITE_OTHER): Payer: Self-pay | Admitting: Otolaryngology

## 2020-06-18 ENCOUNTER — Encounter (INDEPENDENT_AMBULATORY_CARE_PROVIDER_SITE_OTHER): Payer: Self-pay | Admitting: Otolaryngology

## 2020-06-18 VITALS — Temp 96.8°F

## 2020-06-18 DIAGNOSIS — H9041 Sensorineural hearing loss, unilateral, right ear, with unrestricted hearing on the contralateral side: Secondary | ICD-10-CM

## 2020-06-18 DIAGNOSIS — R490 Dysphonia: Secondary | ICD-10-CM

## 2020-06-18 NOTE — Progress Notes (Addendum)
HPI: [name] is a 40 y.o. female who presents is referred by Lissa Morales, MD for evaluation of hoarseness as well as complaints of neck pain more on the right side.  She also complains of sensation of throat tightness especially when she lies down.  She has previously been treated with steroids when she loses her voice.  She does have history of allergies for which she takes Flonase and Zyrtec.  She does not smoke.  She states that sometimes she loses her voice altogether where she cannot speak for several days.  She also complains of some fullness and decreased hearing in the right ear.  But no ear pain or drainage from the ear..  Past Medical History:  Diagnosis Date  . Allergy   . Anxiety   . ANXIETY 12/31/2008   Qualifier: Diagnosis of  By: Nadara Eaton  MD, Mickel Baas    . Breast mass in female 10/06/2017  . Complication of anesthesia    Problem waking up once and a headache  . Condyloma acuminatum 07/21/2006   Qualifier: History of  By: Oneal Grout MD, Manuela Schwartz    . Depression   . Diabetes type 2, controlled ([place]) 04/02/2014  . Family history of anesthesia complication    Mother had problem waking up  . Hx gestational diabetes   . Menorrhagia 07/03/2014  . Spinal headache   . Thyroid fullness 04/02/2014   Past Surgical History:  Procedure Laterality Date  . CESAREAN SECTION    . CHOLECYSTECTOMY N/A 09/06/2013   Procedure: LAPAROSCOPIC CHOLECYSTECTOMY;  Surgeon: Harl Bowie, MD;  Location: [facility];  Service: General;  Laterality: N/A;  . NASAL SINUS SURGERY  2003  . TUBAL LIGATION     Social History   Socioeconomic History  . Marital status: Married    Spouse name: Not on file  . Number of children: Not on file  . Years of education: Not on file  . Highest education level: Not on file  Occupational History  . Not on file  Tobacco Use  . Smoking status: Never Smoker  . Smokeless tobacco: Never Used  Vaping Use  . Vaping Use: Never used  Substance and Sexual Activity  .  Alcohol use: No    Comment: hasn't had a drink since 07/07/2013  . Drug use: No  . Sexual activity: Yes    Birth control/protection: Surgical  Other Topics Concern  . Not on file  Social History Narrative  . Not on file   Social Determinants of Health   Financial Resource Strain: Not on file  Food Insecurity: Not on file  Transportation Needs: Not on file  Physical Activity: Not on file  Stress: Not on file  Social Connections: Not on file   Family History  Problem Relation Age of Onset  . Hypertension Mother   . Hyperlipidemia Mother   . Cancer Mother        cervical  . Diabetes Other   . Breast cancer Paternal Aunt    Allergies  Allergen Reactions  . Amoxicillin Hives and Swelling  . Penicillins Hives and Swelling   Prior to Admission medications   Medication Sig Start Date End Date Taking? Authorizing Provider  cetirizine (ZYRTEC) 10 MG tablet Take 1 tablet (10 mg total) by mouth daily. 12/04/18   Wilber Oliphant, MD  cyclobenzaprine (FLEXERIL) 5 MG tablet Take 1 tablet (5 mg total) by mouth 3 (three) times daily as needed for muscle spasms. 02/12/20   Wilber Oliphant, MD  fluticasone (  FLONASE) 50 MCG/ACT nasal spray Place 2 sprays into both nostrils daily. 03/09/18   Melene PlanKim, Rachel E, MD  metFORMIN (GLUCOPHAGE-XR) 500 MG 24 hr tablet Take 1 tablet (500 mg total) by mouth daily with breakfast. 01/29/20   Melene PlanKim, Rachel E, MD  predniSONE (DELTASONE) 20 MG tablet Take 2 tablets (40 mg total) by mouth daily with breakfast. 05/19/20   Particia NearingLane, Rachel Elizabeth, PA-C  triamcinolone cream (KENALOG) 0.1 % APPLY 1 APPLICATION TOPICALLY TO AFFECTED AREA TWICE DAILY 08/18/19   Melene PlanKim, Rachel E, MD     Positive ROS: Otherwise negative  All other systems have been reviewed and were otherwise negative with the exception of those mentioned in the HPI and as above.  Physical Exam: Constitutional: Alert, well-appearing, no acute distress Ears: External ears without lesions or tenderness. Ear canals  are clear bilaterally.  TMs are clear bilaterally with no obvious middle ear fluid noted.  On tuning fork testing Weber lateralized to the left side.  AC was greater than BC bilaterally.  Subjectively she heard little bit better with the 1024 tuning fork on the left side compared to the right. Nasal: External nose without lesions. Septum slightly deviated to the right.  Mild rhinitis with clear mucus discharge.  Both middle meatus regions were clear..  Oral: Lips and gums without lesions. Tongue and palate mucosa without lesions. Posterior oropharynx clear.  Tonsils are average size and symmetric bilaterally. Fiberoptic laryngoscopy was performed through the left nostril.  The nasopharynx was clear.  The base of tongue vallecula and epiglottis were normal.  Vocal cords were clear bilaterally with normal vocal cord mobility.  Of note on voicing she had preps slight thickening or tendency to develop vocal cord nodules.  However on abduction of the vocal cords there were no nodules noted and no polyps or lesions noted on either vocal cord. Neck: On palpation of the neck she had a small 1 cm lymph node high in the right jugular chain of nodes just behind the submandibular gland.  Also on palpation of the thyroid area this was slightly tender on the right although there were no significant palpable nodules.  She has slightly prominent thyroid lobes and thyroid isthmus.  No supraclavicular adenopathy noted. Respiratory: Breathing comfortably  Skin: No facial/neck lesions or rash noted.  Laryngoscopy  Date/Time: 06/18/2020 9:27 AM Performed by: Drema HalonNewman, Treanna Dumler E, MD Authorized by: Drema HalonNewman, Edmund Rick E, MD   Consent:    Consent obtained:  Verbal   Consent given by:  Patient Procedure details:    Indications: hoarseness, dysphagia, or aspiration     Medication:  Afrin   Instrument: flexible fiberoptic laryngoscope     Scope location: left nare   Sinus:    Left nasopharynx: normal   Mouth:     Oropharynx: normal     Vallecula: normal     Base of tongue: normal     Epiglottis: normal   Throat:    True vocal cords: normal   Comments:     On fiberoptic laryngoscopy the vocal cords had normal mobility and no significant vocal cord lesions.  However on speaking and abduction of the vocal cords she did have a slight tendency to develop vocal cord nodules that were not apparent on abduction of the vocal cords.  She had mild arytenoid mucosal edema but no significant erythema and no mucosal lesions noted.    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 Vocal cords were clear bilaterally although she does appear to have a tendency  to develop vocal cord nodules.  She did state that when she does karaoke she has a tendency to lose her voice which is consistent with exacerbation of vocal cord nodules.  She also may have some degree of laryngeal pharyngeal reflux.  Plan: Discussed with her concerning follow-up here when she "loses her voice" for recheck as needed. Referred her to audiology to get a hearing test to evaluate right ear hearing loss and complaints.  The hearing test will be sent to [place] and she will call us after I have had a chance to review the hearing test.   After she left my office she got a hearing test with [facility] and brought this by the office.  This showed symmetric hearing in both ears with hearing in both ears at 10 dB.   Radene Journey, MD   CC:

## 2020-09-19 ENCOUNTER — Other Ambulatory Visit: Payer: Self-pay | Admitting: Family Medicine

## 2020-09-22 ENCOUNTER — Other Ambulatory Visit: Payer: Self-pay | Admitting: Obstetrics and Gynecology

## 2020-09-22 DIAGNOSIS — Z1231 Encounter for screening mammogram for malignant neoplasm of breast: Secondary | ICD-10-CM

## 2020-10-09 ENCOUNTER — Ambulatory Visit: Payer: Self-pay

## 2020-11-13 ENCOUNTER — Ambulatory Visit: Payer: Self-pay | Admitting: *Deleted

## 2020-11-13 ENCOUNTER — Ambulatory Visit: Payer: Self-pay

## 2020-11-13 ENCOUNTER — Encounter: Payer: Self-pay | Admitting: Family Medicine

## 2020-11-13 ENCOUNTER — Other Ambulatory Visit: Payer: Self-pay

## 2020-11-13 ENCOUNTER — Ambulatory Visit (INDEPENDENT_AMBULATORY_CARE_PROVIDER_SITE_OTHER): Payer: Self-pay | Admitting: Family Medicine

## 2020-11-13 VITALS — BP 118/78 | HR 80 | Wt 168.0 lb

## 2020-11-13 VITALS — BP 118/86 | Wt 167.1 lb

## 2020-11-13 DIAGNOSIS — N898 Other specified noninflammatory disorders of vagina: Secondary | ICD-10-CM | POA: Insufficient documentation

## 2020-11-13 DIAGNOSIS — N6320 Unspecified lump in the left breast, unspecified quadrant: Secondary | ICD-10-CM

## 2020-11-13 DIAGNOSIS — N6321 Unspecified lump in the left breast, upper outer quadrant: Secondary | ICD-10-CM

## 2020-11-13 DIAGNOSIS — D352 Benign neoplasm of pituitary gland: Secondary | ICD-10-CM

## 2020-11-13 DIAGNOSIS — Z1239 Encounter for other screening for malignant neoplasm of breast: Secondary | ICD-10-CM

## 2020-11-13 DIAGNOSIS — D353 Benign neoplasm of craniopharyngeal duct: Secondary | ICD-10-CM

## 2020-11-13 DIAGNOSIS — N644 Mastodynia: Secondary | ICD-10-CM

## 2020-11-13 HISTORY — DX: Other specified noninflammatory disorders of vagina: N89.8

## 2020-11-13 NOTE — Assessment & Plan Note (Signed)
Patient was seen at her eye doctor for vision exam.  They recommended that patient obtain MRI for her visual symptoms.  She also complains of worsening headaches recently.  She has intact visual fields today.  Will order MRI to rule out enlarging pituitary mass.

## 2020-11-13 NOTE — Patient Instructions (Signed)
Explained breast self awareness with Arletha Pili. Patient did not need a Pap smear today due to last Pap smear and HPV typing was 10/20/2017. Let her know [facility] will cover Pap smears and HPV typing every 5 years unless has a history of abnormal Pap smears. Referred patient to the [facility] for a diagnostic mammogram. Appointment scheduled Tuesday, November 25, 2020 at [place]. Patient aware of appointment and will be there. Camillo Flaming Fabela verbalized understanding.  Ilyanna Baillargeon, Arvil Chaco, RN 8:30 AM

## 2020-11-13 NOTE — Progress Notes (Signed)
[name] is a 40 y.o. female who presents to [facility] clinic today with complaint of bilateral outer breast lumps. Patient stated the left breast lump has been there since 6 months prior to previous exam 10/20/2017. Patient stated she first noticed the right breast lump one year ago. Patient complained of bilateral outer breast pain that comes and goes. Patient states the pain is greater within the left breast. Patient rates the pain at a 8 out of 10.    Pap Smear: Pap smear not completed today. Last Pap smear was 10/20/2017 at [facility] clinic and was normal with negative HPV. Per patient has no history of an abnormal Pap smear. Last Pap smear result is available in Epic.   Physical exam: Breasts Left breast slightly larger than right breast. No skin abnormalities bilateral breasts. No nipple retraction right breast. Left nipple slightly inverted that per patient that was noted on previous exam 10/20/2017. No nipple discharge bilateral breasts. No lymphadenopathy. No lumps palpated right breast. Palpated a pea sized mobile lump within the left breast at 1 o'clock 5 cm from the nipple. Complaints of bilateral outer breast pain on exam.  MM DIAG BREAST TOMO BILATERAL  Result Date: 10/20/2017 CLINICAL DATA:  Patient describes LEFT breast discharge which is dark brown and only seen with manual compression. Patient also describes tightness and fullness within the LEFT breast at the 4 o'clock axis. This is patient's baseline mammogram. EXAM: DIGITAL DIAGNOSTIC BILATERAL MAMMOGRAM WITH CAD AND TOMO ULTRASOUND LEFT BREAST COMPARISON:  None. ACR Breast Density Category c: The breast tissue is heterogeneously dense, which may obscure small masses. FINDINGS: Bilateral CC and MLO views were obtained, with additional 3D tomosynthesis, and with additional spot compression view of the outer LEFT breast corresponding to the area of clinical concern, with overlying skin marker in place. There are no new dominant masses,  suspicious calcifications or secondary signs of malignancy within either breast. Specifically, there is no mammographic abnormality within the outer LEFT breast corresponding to the area of clinical concern. Incidental note made of benign layering milk of calcium within the central LEFT breast. Mammographic images were processed with CAD. Targeted ultrasound is performed, evaluating the upper-outer quadrant of the LEFT breast as directed by the patient, showing only normal fibroglandular tissues and fat lobules. There is a ridge of normal dense fibroglandular tissue at the 2 o'clock axis which corresponds to the area of clinical concern. No solid or cystic mass in this area. Additional ultrasound of the retroareolar LEFT breast reveals only normal fibroglandular tissues and fat lobules. No solid or cystic mass. No dilated ducts. IMPRESSION: No evidence of malignancy within either breast. Specifically, no evidence of malignancy within the upper-outer quadrant of the LEFT breast or subareolar LEFT breast, corresponding to the areas of clinical concern. RECOMMENDATION: 1. Screening mammogram at age 82 unless there are persistent or intervening clinical concerns. (Code:[id number]-B-40A) 2. The patient was instructed to return sooner if the area that she feels becomes larger and/or firmer to palpation, or if a new palpable abnormality is identified in either breast. 3. Patient was reassured that the non spontaneous nipple discharge was not concerning for underlying breast cancer. Patient was encouraged to follow-up with referring physician if any spontaneous nipple discharge was noted, especially if bloody or clear. 4. Given patient's family history of breast cancer, would consider the addition of annual screening breast MRI to annual screening mammography. Per American Cancer Society guidelines, annual screening MRI of the breasts is recommended if a risk assessment  calculation for breast cancer, preferably using the  Tyrer-Cuzick model, measures greater than 20%. I have discussed the findings and recommendations with the patient. Results were also provided in writing at the conclusion of the visit. If applicable, a reminder letter will be sent to the patient regarding the next appointment. BI-RADS CATEGORY  1: Negative. Electronically Signed   By: Franki Cabot M.D.   On: 10/20/2017 11:13       Pelvic/Bimanual Pap is not indicated today per BCCCP guidelines.   Smoking History: Patient has never smoked.   Patient Navigation: Patient education provided. Access to services provided for patient through BCCCP program.    Breast and Cervical Cancer Risk Assessment: Patient does not have family history of breast cancer, known genetic mutations, or radiation treatment to the chest before age 39. Patient does not have history of cervical dysplasia, immunocompromised, or DES exposure in-utero.  Risk Assessment     Risk Scores       11/13/2020 10/20/2017   Last edited by: Royston Bake, CMA Rolena Infante H, LPN   5-year risk: 0.4 % 0.3 %   Lifetime risk: 7 % 7.1 %            A: BCCCP exam without pap smear Complaint of bilateral breast pain and lumps.  P: Referred patient to the [facility] for a diagnostic mammogram. Appointment scheduled Tuesday, November 25, 2020 at [place].  Loletta Parish, RN 11/13/2020 8:30 AM

## 2020-11-13 NOTE — Patient Instructions (Addendum)
For the vaginal lesion, continue to keep the area clean.  Use warm compress to help with draining.  Call us if it gets larger in size, or is not improving with warm compress.  We are can order an MRI for your headaches and visual changes.  [facility] will call you to schedule this.

## 2020-11-13 NOTE — Assessment & Plan Note (Signed)
Consistent with small abscess with no signs of tracking. Was able to express serosanguineous fluid from opening in the middle of the nodule.  Was able to take a scalpel in create a small nick to help express fluid to help spontaneous drainage.   Recommended warm compress and continued hygiene to the area.  If any worsening, patient should return for formal I&D.

## 2020-11-13 NOTE — Progress Notes (Signed)
    SUBJECTIVE:   CHIEF COMPLAINT / HPI:   Vaginal Lesion Noticed a few days ago. Felt in shower and was tender. Monogamous with her husband. Recent STD testing negative.   Headaches and vision changes  Noticing more and more recently. Was seen at the eye doctor who recommened repeat MRI with hx of pituitary adenoma   PERTINENT  PMH / PSH: Steatosis, obesity, osteoarthritis of left subtalar joint, multiple food allergies  OBJECTIVE:   BP 118/78   Pulse 80   Wt 168 lb (76.2 kg)   LMP 11/02/2020 (Exact Date)   BMI 30.73 kg/m   General: Well-appearing, no acute distress HEENT: EOMI, PERRLA.  Visual fields intact.  Mild erythema across T-zone, no obvious rash or lesions.  Small scattered patches of hyper pigmentation across top of forehead. Vaginal exam: External vulva otherwise normal-appearing except for small 1 cm fluctuant nodule that comes to a head which expresses serosanguineous fluid on right labia with small pinpoint knick with scalpel   ASSESSMENT/PLAN:   Vaginal lesion Consistent with small abscess with no signs of tracking. Was able to express serosanguineous fluid from opening in the middle of the nodule.  Was able to take a scalpel in create a small nick to help express fluid to help spontaneous drainage.   Recommended warm compress and continued hygiene to the area.  If any worsening, patient should return for formal I&D.  PITUITARY ADENOMA Patient was seen at her eye doctor for vision exam.  They recommended that patient obtain MRI for her visual symptoms.  She also complains of worsening headaches recently.  She has intact visual fields today.  Will order MRI to rule out enlarging pituitary mass.   Wilber Oliphant, MD [place]

## 2020-11-25 ENCOUNTER — Ambulatory Visit: Payer: Self-pay

## 2020-11-25 ENCOUNTER — Ambulatory Visit
Admission: RE | Admit: 2020-11-25 | Discharge: 2020-11-25 | Disposition: A | Discharge status: Home / Self Care | Payer: No Typology Code available for payment source | Source: Ambulatory Visit | Attending: Obstetrics and Gynecology | Admitting: Obstetrics and Gynecology

## 2020-11-25 ENCOUNTER — Other Ambulatory Visit: Payer: Self-pay

## 2020-11-25 DIAGNOSIS — N6320 Unspecified lump in the left breast, unspecified quadrant: Secondary | ICD-10-CM

## 2021-01-27 ENCOUNTER — Other Ambulatory Visit: Payer: Self-pay

## 2021-01-27 DIAGNOSIS — R7303 Prediabetes: Secondary | ICD-10-CM

## 2021-01-27 MED ORDER — METFORMIN HCL ER 500 MG PO TB24: 500.0000 mg | ORAL_TABLET | Freq: Every day | ORAL | 3 refills | Status: DC

## 2021-02-27 ENCOUNTER — Encounter: Payer: Self-pay | Admitting: Family Medicine

## 2021-02-27 ENCOUNTER — Ambulatory Visit (INDEPENDENT_AMBULATORY_CARE_PROVIDER_SITE_OTHER): Payer: Self-pay | Admitting: Family Medicine

## 2021-02-27 ENCOUNTER — Other Ambulatory Visit: Payer: Self-pay

## 2021-02-27 VITALS — BP 120/69 | HR 66 | Ht 62.0 in | Wt 170.2 lb

## 2021-02-27 DIAGNOSIS — R7303 Prediabetes: Secondary | ICD-10-CM

## 2021-02-27 DIAGNOSIS — E119 Type 2 diabetes mellitus without complications: Secondary | ICD-10-CM

## 2021-02-27 DIAGNOSIS — H539 Unspecified visual disturbance: Secondary | ICD-10-CM

## 2021-02-27 LAB — POCT GLYCOSYLATED HEMOGLOBIN (HGB A1C): HbA1c, POC (controlled diabetic range): 5.8 % (ref 0.0–7.0)

## 2021-02-27 NOTE — Patient Instructions (Signed)
It was wonderful to meet you today. Thank you for allowing me to be a part of your care. Below is a short summary of what we discussed at your visit today:  Prediabetes -Your A1c is still in the prediabetic range - You do not need to check your sugars at home - Focus on improving your diet and improving your exercise - Avoid refined processed foods, try to get more whole raw foods like fruits and veggies - Next A1c check in 1 year  Health maintenance -You are due for a Pap smear, please schedule at the front at your convenience - You are due for an annual influenza vaccine, you may schedule this anytime at the front desk - You are due for a COVID booster, again you may schedule anytime with the front desk  Vision changes, head imaging -I want you to get a head MRI as soon as you have insurance - Please call to let me know when you have the insurance so I can place the order - Reasons to seek care at urgent care or the emergency department:  If you develop the worst headache of your life If you suddenly develop difficulty speaking  If you suddenly developed difficulty using your arms or legs If you suddenly develop bowel or bladder incontinence  Please bring all of your medications to every appointment!  If you have any questions or concerns, please do not hesitate to contact us via phone or MyChart message.   Ezequiel Essex, MD

## 2021-02-27 NOTE — Progress Notes (Signed)
    SUBJECTIVE:   CHIEF COMPLAINT / HPI:   Pre-diabetes checkup - Current regimen includes metformin XR 500 mg daily at breakfast - Related diagnoses include nonalcoholic fatty liver disease -Last A1c 1 year ago, measured 5.6 on 02/12/2020 -Since 2015, A1c on record ranges 5.4-6.0. -A1c today 5.8 - has been tolerating medication well when she takes it - some times doesn't take metformin for weeks or a month at a time - reports being shaky when she doesn't eat - checks blood sugar when she doesn't feel good, this past week has checked every day  Vision changes, head imaging - reports that her previous PCP wanted to get a head MRI - could not previously because of lack of insurance, financial barrier - patient has applied for orange card but has not yet heard back confirmation - reports no new or worsening symptoms - still has blurry vision associated with headache  PERTINENT  PMH / PSH: Nonalcoholic fatty liver disease, pituitary adenoma, obesity, prediabetes, seasonal allergies, osteoarthritis of left subtalar joint  OBJECTIVE:   BP 120/69   Pulse 66   Ht 5\' 2"  (1.575 m)   Wt 170 lb 3.2 oz (77.2 kg)   LMP 01/30/2021   SpO2 100%   BMI 31.13 kg/m    PHQ-9:  Depression screen [facility] 2/9 02/27/2021 11/13/2020 05/22/2020  Decreased Interest 1 2 1   Down, Depressed, Hopeless 0 0 0  PHQ - 2 Score 1 2 1   Altered sleeping 2 2 0  Tired, decreased energy 3 3 0  Change in appetite 1 0 1  Feeling bad or failure about yourself  0 0 2  Trouble concentrating 1 0 0  Moving slowly or fidgety/restless 0 0 0  Suicidal thoughts 0 0 0  PHQ-9 Score 8 7 4   Difficult doing work/chores Not difficult at all Somewhat difficult -  Some recent data might be hidden     GAD-7:  GAD 7 : Generalized Anxiety Score 01/26/2017  Nervous, Anxious, on Edge 1  Control/stop worrying 0  Worry too much - different things 0  Trouble relaxing 2  Restless 0  Easily annoyed or irritable 3  Afraid - awful might  happen 0  Total GAD 7 Score 6  Anxiety Difficulty Somewhat difficult    Physical Exam General: Awake, alert, oriented HEENT: PERRL, thyroid unremarkable and symmetrical to palpation Lymph: No palpable lymphedema of head or neck Cardiovascular: Regular rate and rhythm, S1 and S2 present, no murmurs auscultated Respiratory: Lung fields clear to auscultation bilaterally  ASSESSMENT/PLAN:   Prediabetes A1c 5.8, stable from 5.6 one year ago. Will continue metformin. Next A1 recheck in one year.   Vision changes Patient reports chronic blurry vision changes associated with headaches. Blurry vision "like your eyes are open underwater". Previous PCP wanted head MRI; however, patient was (and still is uninsured) so cost is a barrier. Patient has no red flag symptoms today, I do not believe urgent head imaging is warranted. Gave strict return precautions. Patient to notify us once she has been approved for orange card, I will place order for head MRI at that time.      Ezequiel Essex, MD [place]

## 2021-03-01 DIAGNOSIS — H539 Unspecified visual disturbance: Secondary | ICD-10-CM

## 2021-03-01 HISTORY — DX: Unspecified visual disturbance: H53.9

## 2021-03-01 NOTE — Assessment & Plan Note (Signed)
A1c 5.8, stable from 5.6 one year ago. Will continue metformin. Next A1 recheck in one year.

## 2021-03-01 NOTE — Assessment & Plan Note (Signed)
Patient reports chronic blurry vision changes associated with headaches. Blurry vision "like your eyes are open underwater". Previous PCP wanted head MRI; however, patient was (and still is uninsured) so cost is a barrier. Patient has no red flag symptoms today, I do not believe urgent head imaging is warranted. Gave strict return precautions. Patient to notify us once she has been approved for orange card, I will place order for head MRI at that time.

## 2021-03-12 ENCOUNTER — Encounter: Payer: Self-pay | Admitting: Family Medicine

## 2021-03-16 ENCOUNTER — Telehealth: Payer: Self-pay | Admitting: Family Medicine

## 2021-03-16 DIAGNOSIS — H539 Unspecified visual disturbance: Secondary | ICD-10-CM

## 2021-03-16 DIAGNOSIS — D353 Benign neoplasm of craniopharyngeal duct: Secondary | ICD-10-CM

## 2021-03-16 DIAGNOSIS — R519 Headache, unspecified: Secondary | ICD-10-CM

## 2021-03-16 DIAGNOSIS — D352 Benign neoplasm of pituitary gland: Secondary | ICD-10-CM

## 2021-03-16 NOTE — Telephone Encounter (Signed)
Looks like there is an active MR brain w and wo contrast order from Dr. Andria Frames in June. This will be sufficient. I will route this to the RN pool for scheduling with [facility] hospital radiology department. Please call patient with day and time of appointment. See her MyChart message for scheduling preferences.   Ezequiel Essex, MD

## 2021-03-16 NOTE — Telephone Encounter (Signed)
[name] is calling to inform Dr. Jeani Hawking that her orange card has been approved. She would like to know if she can now place the referral for her to have mri.   Please call [name] with any questions. [phone].

## 2021-03-18 NOTE — Telephone Encounter (Signed)
I sent patient a mychart message regarding the orange card coverage vs. [facility] financial coverage.     "Unfortunately the orange card doesn't cover MRIs.  Did you apply for [facility] financial assistance?  This is the way to get imaging and things paid for with [facility].  The orange won't cover your visits with [place] either.  Larz Mark,CMA"

## 2021-03-25 ENCOUNTER — Encounter: Payer: Self-pay | Admitting: Family Medicine

## 2021-03-25 ENCOUNTER — Telehealth: Payer: Self-pay

## 2021-03-25 ENCOUNTER — Other Ambulatory Visit: Payer: Self-pay

## 2021-03-25 ENCOUNTER — Ambulatory Visit (INDEPENDENT_AMBULATORY_CARE_PROVIDER_SITE_OTHER): Payer: Self-pay | Admitting: Family Medicine

## 2021-03-25 VITALS — BP 123/75 | HR 70 | Ht 62.0 in | Wt 174.4 lb

## 2021-03-25 DIAGNOSIS — R1011 Right upper quadrant pain: Secondary | ICD-10-CM

## 2021-03-25 MED ORDER — HYDROMORPHONE HCL 2 MG PO TABS: 1.0000 mg | ORAL_TABLET | Freq: Four times a day (QID) | ORAL | 0 refills | Status: AC | PRN

## 2021-03-25 NOTE — Progress Notes (Signed)
    SUBJECTIVE:   CHIEF COMPLAINT / HPI: right side abdominal pain  Reports right upper abdominal pain that starts middle of stomach and radiates around right side to back and extending to buttock. Started on Saturday. Worse after eating. Also feels like there are bumps/lumps under ribs on right side.  Denies any fevers,nausea, vomiting, diarrhea,constipation.   Reports similar feeling prior to gallbladder removal.  No weight loss.  Using weight loss medication for constipation intermittently, unsure name of med but suppose to be herbal.  PERTINENT  PMH / PSH:  Lap Chole 2015 OBJECTIVE:   BP 123/75   Pulse 70   Ht 5\' 2"  (1.575 m)   Wt 174 lb 6.4 oz (79.1 kg)   LMP 02/23/2021 (Approximate)   SpO2 100%   BMI 31.90 kg/m    General: Alert, no acute distress Cardio: Normal S1 and S2, RRR, no r/m/g Pulm: CTAB, normal work of breathing Abdomen: Bowel sounds normal. Abdomen soft.  Tenderness to midepigastric, RUQ area.  Intermittent palpable mass noted along liver border.  ASSESSMENT/PLAN:   Abdominal pain Unclear etiology.  Cholecystectomy in 2015. Differentials include pancreatitis,hepatitic mass, constipation, PUD.  -CT abd scheduled -CBC,Lipase, CMet,Lipid panel today -Dilaudid 0.5 mg q6h prn x 3 days -Follow up with results -Follow up in 2 weeks after CT  -Follow up with PCP sooner if symptoms worsen   Carollee Leitz, MD [place]

## 2021-03-25 NOTE — Patient Instructions (Signed)
Thank you for coming to see me today. It was a pleasure.   We will get some labs today.  If they are abnormal or we need to do something about them, I will call you.  If they are normal, I will send you a message on MyChart (if it is active) or a letter in the mail.  If you don't hear from [place] in 2 weeks, please call the office at the number below.   I have placed an order for CT of your abdomen.  Please go to [place] at [facility] or at [facility] to have this completed.  You do not need an appointment, but if you would like to call them beforehand, their number is [phone].  We will contact you with your results afterwards.    Ordered Dilaudid 1 mg every 6 hours as needed for pain  Please follow-up with me in 2 weeks  If you have any questions or concerns, please do not hesitate to call the office at [phone].  Best,   Carollee Leitz, MD

## 2021-03-25 NOTE — Telephone Encounter (Signed)
Patient calls nurse line complaining for RUQ pain. Patient reports pain is intensified when she eats something. Patient reports when she presses on the area she feels a small knot. Patient reports symptoms started on Saturday. Patient denies any NVD. Patient does report some constipation. Patient states, "the pain feels like before I had my gallbladder removed."   Patient scheduled for the afternoon for evaluation.

## 2021-03-26 LAB — COMPREHENSIVE METABOLIC PANEL
ALT: 24 IU/L (ref 0–32)
AST: 19 IU/L (ref 0–40)
Albumin/Globulin Ratio: 1.3 (ref 1.2–2.2)
Albumin: 4.3 g/dL (ref 3.8–4.8)
Alkaline Phosphatase: 104 IU/L (ref 44–121)
BUN/Creatinine Ratio: 14 (ref 9–23)
BUN: 10 mg/dL (ref 6–24)
Bilirubin Total: <0.2 mg/dL (ref 0.0–1.2)
CO2: 23 mmol/L (ref 20–29)
Calcium: 9.5 mg/dL (ref 8.7–10.2)
Chloride: 100 mmol/L (ref 96–106)
Creatinine, Ser: 0.72 mg/dL (ref 0.57–1.00)
Globulin, Total: 3.3 g/dL (ref 1.5–4.5)
Glucose: 143 mg/dL — ABNORMAL HIGH (ref 70–99)
Potassium: 4 mmol/L (ref 3.5–5.2)
Sodium: 136 mmol/L (ref 134–144)
Total Protein: 7.6 g/dL (ref 6.0–8.5)
eGFR: 108 mL/min/{1.73_m2} (ref >59)

## 2021-03-26 LAB — LIPID PANEL
Chol/HDL Ratio: 4.4 ratio (ref 0.0–4.4)
Cholesterol, Total: 210 mg/dL — ABNORMAL HIGH (ref 100–199)
HDL: 48 mg/dL (ref >39)
LDL Chol Calc (NIH): 104 mg/dL — ABNORMAL HIGH (ref 0–99)
Triglycerides: 345 mg/dL — ABNORMAL HIGH (ref 0–149)
VLDL Cholesterol Cal: 58 mg/dL — ABNORMAL HIGH (ref 5–40)

## 2021-03-26 LAB — CBC
Hematocrit: 36.5 % (ref 34.0–46.6)
Hemoglobin: 12.7 g/dL (ref 11.1–15.9)
MCH: 32.2 pg (ref 26.6–33.0)
MCHC: 34.8 g/dL (ref 31.5–35.7)
MCV: 92 fL (ref 79–97)
Platelets: 324 10*3/uL (ref 150–450)
RBC: 3.95 x10E6/uL (ref 3.77–5.28)
RDW: 12.3 % (ref 11.7–15.4)
WBC: 9.2 10*3/uL (ref 3.4–10.8)

## 2021-03-26 LAB — LIPASE: Lipase: 35 U/L (ref 14–72)

## 2021-03-29 ENCOUNTER — Encounter: Payer: Self-pay | Admitting: Family Medicine

## 2021-03-29 DIAGNOSIS — R109 Unspecified abdominal pain: Secondary | ICD-10-CM

## 2021-03-29 HISTORY — DX: Unspecified abdominal pain: R10.9

## 2021-03-29 NOTE — Assessment & Plan Note (Addendum)
Unclear etiology.  Cholecystectomy in 2015. Differentials include pancreatitis,hepatitic mass, constipation, PUD.  -CT abd scheduled -CBC,Lipase, CMet,Lipid panel today -Dilaudid 0.5 mg q6h prn x 3 days -Follow up with results -Follow up in 2 weeks after CT  -Follow up with PCP sooner if symptoms worsen

## 2021-04-02 ENCOUNTER — Ambulatory Visit (HOSPITAL_COMMUNITY)
Admission: RE | Admit: 2021-04-02 | Discharge: 2021-04-02 | Disposition: A | Discharge status: Home / Self Care | Payer: Self-pay | Source: Ambulatory Visit | Attending: Family Medicine | Admitting: Family Medicine

## 2021-04-02 ENCOUNTER — Other Ambulatory Visit: Payer: Self-pay

## 2021-04-02 DIAGNOSIS — R1011 Right upper quadrant pain: Secondary | ICD-10-CM | POA: Insufficient documentation

## 2021-04-07 ENCOUNTER — Encounter (HOSPITAL_COMMUNITY): Payer: Self-pay

## 2021-04-07 ENCOUNTER — Other Ambulatory Visit: Payer: Self-pay

## 2021-04-07 ENCOUNTER — Ambulatory Visit (HOSPITAL_COMMUNITY)
Admission: EM | Admit: 2021-04-07 | Discharge: 2021-04-07 | Disposition: A | Discharge status: Home / Self Care | Payer: No Typology Code available for payment source | Attending: Family Medicine | Admitting: Family Medicine

## 2021-04-07 DIAGNOSIS — Z20822 Contact with and (suspected) exposure to covid-19: Secondary | ICD-10-CM | POA: Insufficient documentation

## 2021-04-07 DIAGNOSIS — Z8616 Personal history of COVID-19: Secondary | ICD-10-CM | POA: Insufficient documentation

## 2021-04-07 DIAGNOSIS — J069 Acute upper respiratory infection, unspecified: Secondary | ICD-10-CM | POA: Insufficient documentation

## 2021-04-07 LAB — POC INFLUENZA A AND B ANTIGEN (URGENT CARE ONLY)
INFLUENZA A ANTIGEN, POC: NEGATIVE
INFLUENZA B ANTIGEN, POC: NEGATIVE

## 2021-04-07 MED ORDER — ACETAMINOPHEN 325 MG PO TABS
ORAL_TABLET | ORAL | Status: AC
  Filled 2021-04-07: qty 2

## 2021-04-07 MED ORDER — ACETAMINOPHEN 325 MG PO TABS
650.0000 mg | ORAL_TABLET | Freq: Once | ORAL | Status: AC
  Administered 2021-04-07: 650 mg via ORAL

## 2021-04-07 NOTE — Discharge Instructions (Addendum)
Influenza negative, COVID test results pending. If you have COVID, you will get a call with instructions on what to do. If you do not have COVID, you will not get a call.   Use over the counter cold and flu medicines to help treat your symptoms until you get better. Ibuprofen might help with pain more than tylenol.   Please drink LOTS of liquids to keep yourself hydrated. It's ok if you don't feel like eating.

## 2021-04-07 NOTE — ED Notes (Signed)
2 nasal swabs labeled and placed in lab

## 2021-04-07 NOTE — ED Triage Notes (Signed)
Pt presents with a dry cough, and chest pain. States she has a headache and has not at all day. States she has had fever and chills x 2 days. Reports lower back pain.

## 2021-04-07 NOTE — ED Provider Notes (Signed)
[place]    CSN: [id number] Arrival date & time: 04/07/21  1544      History   Chief Complaint Chief Complaint  Patient presents with   Fever   Cough    HPI [name] is a 40 y.o. female. She reports abrupt onset of symptoms last night including fever, chills, myalgias, cough, and nasal congestion.  Denies sore throat, abdominal pain, nausea, or vomiting.  She has been using DayQuil and NyQuil which somewhat helped her symptoms temporarily.  With medicine, the lowest her fever has been as 100 F.  Reports she feels like she did when she had COVID.   Fever Associated symptoms: chills, congestion, cough and rhinorrhea   Associated symptoms: no sore throat   Cough Associated symptoms: chills, fever, rhinorrhea and shortness of breath   Associated symptoms: no sore throat and no wheezing    Past Medical History:  Diagnosis Date   Allergy    Anxiety    ANXIETY 12/31/2008   Qualifier: Diagnosis of  By: Nadara Eaton  MD, Mickel Baas     Breast mass in female 08/30/8117   Complication of anesthesia    Problem waking up once and a headache   Condyloma acuminatum 07/21/2006   Qualifier: History of  By: Oneal Grout MD, Manuela Schwartz     Depression    Diabetes type 2, controlled ([place]) 04/02/2014   Family history of anesthesia complication    Mother had problem waking up   Hx gestational diabetes    Laryngitis 02/15/2020   Menorrhagia 07/03/2014   Molluscum contagiosum infection 12/01/2018   Muscle strain of lower leg, right, initial encounter 03/05/2019   Spinal headache    Thyroid fullness 04/02/2014   Vaginal lesion 11/13/2020   Voice hoarseness 05/22/2020    Patient Active Problem List   Diagnosis Date Noted   Abdominal pain 03/29/2021   Vision changes 03/01/2021   Screening examination for STD (sexually transmitted disease) 02/15/2020   Congenital vertical talus deformity, 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   Coalition, 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   Hypochondriasis 08/19/2014   Cystic thyroid nodule 07/03/2014   Prediabetes 04/02/2014   Seasonal allergies 10/24/2012   Multiple food allergies 10/24/2012   Obesity, unspecified 07/23/2012   Fatty liver disease, nonalcoholic 14/78/2956   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 Authorizing Provider  cetirizine (ZYRTEC) 10 MG tablet Take 1 tablet (10 mg total) by mouth daily. 12/04/18   Wilber Oliphant, MD  cyclobenzaprine (FLEXERIL) 5 MG tablet Take 1 tablet (5 mg total) by mouth 3 (three) times daily as  needed for muscle spasms. Patient not taking: Reported on 11/13/2020 02/12/20   Wilber Oliphant, MD  fluticasone [facility]) 50 MCG/ACT nasal spray Place 2 sprays into both nostrils daily. 03/09/18   Wilber Oliphant, MD  ketorolac (ACULAR) 0.5 % ophthalmic solution 1 drop 2 (two) times daily. 10/14/20   [provider]  metFORMIN (GLUCOPHAGE-XR) 500 MG 24 hr tablet Take 1 tablet (500 mg total) by mouth daily with breakfast. 01/27/21   Ezequiel Essex, MD  predniSONE (DELTASONE) 20 MG tablet Take 2 tablets (40 mg total) by mouth daily with breakfast. 05/19/20   Volney American, PA-C  triamcinolone cream (KENALOG) 0.1 % APPLY 1 APPLICATION TOPICALLY TO AFFECTED AREA TWICE DAILY Patient not taking: Reported on 11/13/2020 08/18/19   Wilber Oliphant, MD    Family History Family History  Problem Relation Age of Onset    Hypertension Mother    Hyperlipidemia Mother    Cancer Mother        cervical   Diabetes Father    Hypertension Father    Irregular heart beat Father    Breast cancer Paternal Aunt    Diabetes Other     Social History Social History   Tobacco Use   Smoking status: Never   Smokeless tobacco: Never  Vaping Use   Vaping Use: Never used  Substance Use Topics   Alcohol use: Yes    Comment: occ.   Drug use: No     Allergies   Amoxicillin and Penicillins   Review of Systems Review of Systems  Constitutional:  Positive for chills and fever.  HENT:  Positive for congestion, postnasal drip and rhinorrhea. Negative for sore throat.   Respiratory:  Positive for cough, chest tightness and shortness of breath. Negative for wheezing.     Physical Exam Triage Vital Signs ED Triage Vitals  Enc Vitals Group     BP 04/07/21 1648 125/85     Pulse Rate 04/07/21 1648 (!) 110     Resp 04/07/21 1648 18     Temp 04/07/21 1648 (!) 103.1 F (39.5 C)     Temp Source 04/07/21 1648 Oral     SpO2 04/07/21 1648 98 %     Weight --      Height --      Head Circumference --      Peak Flow --      Pain Score 04/07/21 1646 5     Pain Loc --      Pain Edu? --      Excl. in [place]? --    No data found.  Updated Vital Signs BP 125/85 (BP Location: Left Arm)   Pulse (!) 110   Temp (!) 103.1 F (39.5 C) (Oral)   Resp 18   LMP 04/04/2021 (Approximate)   SpO2 98%   Visual Acuity Right Eye Distance:   Left Eye Distance:   Bilateral Distance:    Right Eye Near:   Left Eye Near:    Bilateral Near:     Physical Exam Constitutional:      General: She is not in acute distress.    Appearance: Normal appearance. She is ill-appearing. She is not toxic-appearing.  HENT:     Right Ear: Tympanic membrane, ear canal and external ear normal.     Left Ear: Tympanic membrane, ear canal and external ear normal.     Nose: Congestion and rhinorrhea present.     Mouth/Throat:     Mouth: Mucous  membranes are moist.  Pharynx: Oropharynx is clear.  Cardiovascular:     Rate and Rhythm: Regular rhythm. Tachycardia present.  Pulmonary:     Effort: Pulmonary effort is normal.     Breath sounds: Normal breath sounds.     Comments: Frequent dry cough observed during H&P Neurological:     Mental Status: She is alert.     UC Treatments / Results  Labs (all labs ordered are listed, but only abnormal results are displayed) Labs Reviewed  SARS CORONAVIRUS 2 (TAT 6-24 HRS)  POC INFLUENZA A AND B ANTIGEN (URGENT CARE ONLY)    EKG   Radiology No results found.  Procedures Procedures (including critical care time)  Medications Ordered in UC Medications  acetaminophen (TYLENOL) tablet 650 mg (650 mg Oral Given 04/07/21 1713)    Initial Impression / Assessment and Plan / UC Course  I have reviewed the triage vital signs and the nursing notes.  Pertinent labs & imaging results that were available during my care of the patient were reviewed by me and considered in my medical decision making (see chart for details).    Influenza negative, covid pending. Reviewed supportive care measures. Temp down to 100.19F after tylenol here at Urgent Care.   Final Clinical Impressions(s) / UC Diagnoses   Final diagnoses:  Viral upper respiratory tract infection     Discharge Instructions      Influenza negative, COVID test results pending. If you have COVID, you will get a call with instructions on what to do. If you do not have COVID, you will not get a call.   Use over the counter cold and flu medicines to help treat your symptoms until you get better. Ibuprofen might help with pain more than tylenol.   Please drink LOTS of liquids to keep yourself hydrated. It's ok if you don't feel like eating.      ED Prescriptions   None    PDMP not reviewed this encounter.   Carvel Getting, NP 04/07/21 1827

## 2021-04-08 ENCOUNTER — Ambulatory Visit: Payer: No Typology Code available for payment source | Admitting: Family Medicine

## 2021-04-08 LAB — SARS CORONAVIRUS 2 (TAT 6-24 HRS): SARS Coronavirus 2: NEGATIVE

## 2021-04-28 ENCOUNTER — Other Ambulatory Visit: Payer: Self-pay | Admitting: Family Medicine

## 2021-05-11 NOTE — Progress Notes (Deleted)
° ° °  SUBJECTIVE:   CHIEF COMPLAINT / HPI:   Lab results - patient presents to discuss lab results - last saw [facility] 03/28/21, provider notes below Abdominal pain, RUQ Unclear etiology.  Cholecystectomy in 2015. Differentials include pancreatitis,hepatitic mass, constipation, PUD.  -CT abd scheduled -CBC,Lipase, CMet,Lipid panel today -Dilaudid 0.5 mg q6h prn x 3 days -Follow up with results -Follow up in 2 weeks after CT  -Follow up with PCP sooner if symptoms worsen - CMP, CBC unremarkable - Lipase normal - Lipids moderately elevated (patient reports ***fasting at time of lab draw) Lab Results  Component Value Date   CHOL 210 (H) 03/25/2021   HDL 48 03/25/2021   LDLCALC 104 (H) 03/25/2021   TRIG 345 (H) 03/25/2021   CHOLHDL 4.4 03/25/2021  - 10 year ASCVD risk 0.8% based on above lipid panel and BP measurements from last visit, no statin indicated   PERTINENT  PMH / PSH:  Patient Active Problem List   Diagnosis Date Noted   Abdominal pain 03/29/2021   Vision changes 03/01/2021   Screening examination for STD (sexually transmitted disease) 02/15/2020   Congenital vertical talus deformity, 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   Coalition, 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  ***  ASSESSMENT/PLAN:   No problem-specific Assessment & Plan notes found for this encounter.     Ezequiel Essex, MD [place]

## 2021-05-11 NOTE — Patient Instructions (Incomplete)
I have translated the following text using Google translate.  As such, there are many errors.  I apologize for the poor written translation; however, we do not have written  translation services yet. It was wonderful to see you today. Thank you for allowing me to be a part of your care. Below is a short summary of what we discussed at your visit today:  Lab results Overall, your lab results look good! Your blood cell counts and blood electrolytes were normal. Your lipase (indicating pancreas stress) is negative. Your cholesterol level is elevated, but we can manage this with diet and exercise changes. One of the best diets for reducing cholesterol is the Mediterranean diet (which is also good for heart health, too). I have included a handout for you.   At this time, you do NOT need a medicine for your cholesterol.   Health Maintenance We like to think about ways to keep you healthy for years to come. Below are some interventions and screenings we can offer to keep you healthy: - Hepatitis C screening - bivalent COVID booster vaccine    Cooking and Nutrition Classes [employer] in [facility] provides many classes at low or no cost to [employer], nutrition, and agriculture.  Their website offers a huge variety of information related to topics such as gardening, nutrition, cooking, parenting, and health.  Also listed are classes and events, both online and in-person.  Check out their website here: [email or web address]      Please bring all of your medications to every appointment!  If you have any questions or concerns, please do not hesitate to contact us via phone or MyChart message.   Ezequiel Essex, MD

## 2021-05-13 ENCOUNTER — Other Ambulatory Visit: Payer: Self-pay | Admitting: Family Medicine

## 2021-05-13 ENCOUNTER — Ambulatory Visit: Payer: Self-pay | Admitting: Family Medicine

## 2021-05-13 NOTE — Progress Notes (Signed)
No show letter.  Janice Essex, MD

## 2021-05-20 ENCOUNTER — Other Ambulatory Visit: Payer: Self-pay

## 2021-05-20 ENCOUNTER — Encounter: Payer: Self-pay | Admitting: Family Medicine

## 2021-05-20 ENCOUNTER — Ambulatory Visit (INDEPENDENT_AMBULATORY_CARE_PROVIDER_SITE_OTHER): Payer: Self-pay

## 2021-05-20 ENCOUNTER — Ambulatory Visit (INDEPENDENT_AMBULATORY_CARE_PROVIDER_SITE_OTHER): Payer: Self-pay | Admitting: Family Medicine

## 2021-05-20 VITALS — BP 136/88 | HR 75 | Ht 62.0 in | Wt 171.1 lb

## 2021-05-20 DIAGNOSIS — M67431 Ganglion, right wrist: Secondary | ICD-10-CM

## 2021-05-20 DIAGNOSIS — Z23 Encounter for immunization: Secondary | ICD-10-CM

## 2021-05-20 DIAGNOSIS — M67439 Ganglion, unspecified wrist: Secondary | ICD-10-CM | POA: Insufficient documentation

## 2021-05-20 DIAGNOSIS — Z0000 Encounter for general adult medical examination without abnormal findings: Secondary | ICD-10-CM

## 2021-05-20 DIAGNOSIS — M6740 Ganglion, unspecified site: Secondary | ICD-10-CM

## 2021-05-20 DIAGNOSIS — E782 Mixed hyperlipidemia: Secondary | ICD-10-CM

## 2021-05-20 DIAGNOSIS — R1011 Right upper quadrant pain: Secondary | ICD-10-CM

## 2021-05-20 DIAGNOSIS — E785 Hyperlipidemia, unspecified: Secondary | ICD-10-CM | POA: Insufficient documentation

## 2021-05-20 HISTORY — DX: Encounter for general adult medical examination without abnormal findings: Z00.00

## 2021-05-20 HISTORY — DX: Hyperlipidemia, unspecified: E78.5

## 2021-05-20 HISTORY — DX: Ganglion, unspecified wrist: M67.439

## 2021-05-20 NOTE — Progress Notes (Signed)
° ° °  SUBJECTIVE:   CHIEF COMPLAINT / HPI:   Lab results   Abdominal pain - all turned out normal - lipid panel elevated, ASCVD 0.9%, no statin indicated a this time - discussed diet changes - patient wonders if a GI clinic would take the orange card  Painful wrist nodule on right - right wrist dorsum - x5-6 months - started with a small bump on wrist dorsum - worsening pain after using it repeatedly - pain feels like she was smacked with something - reports pain starting in wrist radiating superiorly - the larger the bump, the more numb her fingers get - if pressed along forearm, can feel pain in wrist - has developed worsening grip strength associated with this  Health Maintenance  - due for COVID and flu boosters - amenable to getting this today  PERTINENT  PMH / PSH:  Patient Active Problem List   Diagnosis Date Noted   Hyperlipidemia 05/20/2021   Ganglion cyst 05/20/2021   Healthcare maintenance 05/20/2021   Abdominal pain 03/29/2021   Vision changes 03/01/2021   Screening examination for STD (sexually transmitted disease) 02/15/2020   Congenital vertical talus deformity, 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   Coalition, 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   Hypochondriasis 08/19/2014   Cystic thyroid nodule 07/03/2014   Prediabetes 04/02/2014   Seasonal allergies 10/24/2012   Multiple food allergies 10/24/2012   Obesity, unspecified 07/23/2012   Fatty liver disease, nonalcoholic 32/95/1884   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     OBJECTIVE:   BP 136/88    Pulse 75    Ht 5\' 2"  (1.575 m)    Wt 171 lb 2 oz (77.6 kg)    LMP 05/07/2021 (Exact Date)    SpO2 100%    BMI 31.30 kg/m   PHQ-9:  Depression screen [facility] 2/9 05/20/2021 03/25/2021 02/27/2021  Decreased Interest 2 1 1   Down, Depressed, Hopeless 0 0 0  PHQ - 2 Score 2 1 1   Altered sleeping 3 1 2   Tired, decreased energy 2 2 3   Change in appetite 1 0 1  Feeling bad or failure about  yourself  0 0 0  Trouble concentrating 1 1 1   Moving slowly or fidgety/restless - 0 0  Suicidal thoughts 0 0 0  PHQ-9 Score 9 5 8   Difficult doing work/chores - - Not difficult at all  Some recent data might be hidden    GAD-7:  GAD 7 : Generalized Anxiety Score 01/26/2017  Nervous, Anxious, on Edge 1  Control/stop worrying 0  Worry too much - different things 0  Trouble relaxing 2  Restless 0  Easily annoyed or irritable 3  Afraid - awful might happen 0  Total GAD 7 Score 6  Anxiety Difficulty Somewhat difficult   Physical Exam General: Awake, alert, oriented, no acute distress Respiratory: Unlabored respirations, speaking in full sentences, no respiratory distress Extremities: Moving all extremities spontaneously, cyst on dorsum of right hand Neuro: Cranial nerves II through X grossly intact Psych: Normal insight and judgement  ASSESSMENT/PLAN:   Hyperlipidemia New. ASCVD 0.9%, no statin indicated at this time. Discussed diet changes, recommend Mediterranean diet.  Recheck direct LDL in 3-6 months.  Ganglion cyst Chronic, x6 months. Ganglion cyst on dorsum of right wrist. Referred to sports medicine for ultrasound, drainage, and management.   Healthcare maintenance Received COVID and flu boosters, tolerated well.      Ezequiel Essex, MD [place]

## 2021-05-20 NOTE — Patient Instructions (Addendum)
It was wonderful to see you today. Thank you for allowing me to be a part of your care. Below is a short summary of what we discussed at your visit today:  Wrist pain, cyst I have referred you to sports medicine for ultrasound and possible drainage of cyst on your wrist. Please let us know if they do not call you within 1-2 weeks.  Abdominal pain after eating I have referred you to GI for evaluation of this abdominal pain after you eat.  Given that you have your gallbladder out, it is not your gallbladder.  GI might be able to do a GI motility study or take a look with a camera to see if you have diverticulosis.  Labs from last visit You should be able to access your labs in my chart on your phone.  Please see paperwork here today for instructions on how to activate your MyChart if you do not know how.  The labs from your last visit overall looks quite normal.  There was no evidence of kidney or liver dysfunction.  You did not have anemia and your blood electrolytes were normal.  One thing that was out of the ordinary is your cholesterol is a little higher than we would like.  We would like your LDL or "bad cholesterol" to be less than 100.  I have attached some handouts on diet changes you can implement to improve your cholesterol.  As of right now, you do not need a cholesterol medicine.  We will recheck your cholesterol in 3 to 6 months to see how you are doing with diet changes    Please bring all of your medications to every appointment!  If you have any questions or concerns, please do not hesitate to contact us via phone or MyChart message.   Ezequiel Essex, MD

## 2021-05-20 NOTE — Assessment & Plan Note (Signed)
Received COVID and flu boosters, tolerated well.

## 2021-05-20 NOTE — Assessment & Plan Note (Signed)
Chronic, x6 months. Ganglion cyst on dorsum of right wrist. Referred to sports medicine for ultrasound, drainage, and management.

## 2021-05-20 NOTE — Assessment & Plan Note (Signed)
New. ASCVD 0.9%, no statin indicated at this time. Discussed diet changes, recommend Mediterranean diet.  Recheck direct LDL in 3-6 months.

## 2021-06-03 ENCOUNTER — Ambulatory Visit: Payer: No Typology Code available for payment source | Admitting: Family Medicine

## 2021-06-05 ENCOUNTER — Ambulatory Visit: Payer: No Typology Code available for payment source | Admitting: Sports Medicine

## 2021-06-05 ENCOUNTER — Ambulatory Visit (INDEPENDENT_AMBULATORY_CARE_PROVIDER_SITE_OTHER): Payer: Self-pay | Admitting: Family Medicine

## 2021-06-05 ENCOUNTER — Other Ambulatory Visit: Payer: Self-pay

## 2021-06-05 ENCOUNTER — Encounter: Payer: Self-pay | Admitting: Family Medicine

## 2021-06-05 VITALS — BP 138/81 | HR 67 | Ht 62.0 in | Wt 173.2 lb

## 2021-06-05 DIAGNOSIS — J309 Allergic rhinitis, unspecified: Secondary | ICD-10-CM

## 2021-06-05 DIAGNOSIS — L509 Urticaria, unspecified: Secondary | ICD-10-CM

## 2021-06-05 DIAGNOSIS — T7840XD Allergy, unspecified, subsequent encounter: Secondary | ICD-10-CM

## 2021-06-05 MED ORDER — CETIRIZINE HCL 10 MG PO TABS: 10.0000 mg | ORAL_TABLET | Freq: Every day | ORAL | 11 refills | Status: AC

## 2021-06-05 NOTE — Progress Notes (Signed)
° ° ° °  SUBJECTIVE:   CHIEF COMPLAINT / HPI:   [name] is a 41 y.o. female presents for allergic reaction  Hives Pt experienced urticarial rash on Monday. She had sudden hives appear all over the body, face, eyelids. Her skin felt warm to touch and was red and swollen. Her tongue was also swollen. Denies chest pain, dyspnea, palpitations, dizziness etc. Took a cold shower which helped. She also took pills of prednisone which she had at home (unsure of dose). Has had previous similar reaction before-after ankle surgery which may have been a reaction to ibuprofen. Has multiple allergies to medication, chemicals and food. No recent medications changes or new food introduced, travel or lauindry detergent. Would like referral to allergist.   Flowsheet Row Office Visit from 06/05/2021 in [facility]  PHQ-9 Total Score 4        PERTINENT  PMH / PSH: pituitary adenoma, thyroid nodule, fatty liver disease  OBJECTIVE:   BP 138/81    Pulse 67    Ht 5\' 2"  (1.575 m)    Wt 173 lb 3.2 oz (78.6 kg)    LMP 05/07/2021 (Exact Date)    SpO2 100%    BMI 31.68 kg/m    General: Alert, no acute distress Cardio: Normal S1 and S2, RRR, no r/m/g Pulm: CTAB, normal work of breathing Abdomen: Bowel sounds normal. Abdomen soft and non-tender.  Extremities: No peripheral edema.  Neuro: Cranial nerves grossly intact   ASSESSMENT/PLAN:   Allergic reaction Acute allergic reaction/urticaria which has now resolved. Hot showers seem to trigger the rash but pt likely has other triggers too which are unknown. Recommended zyrtec and avoiding known triggers. Recommended against self medicating with steroids at home and pt should follow up again if she continues to experience allergic reaction. Have provided her with epi pen too. Referred to allergist for testing.    Lattie Haw, MD PGY-3 [place]

## 2021-06-05 NOTE — Patient Instructions (Signed)
Thank you for coming to see me today. It was a pleasure. Today we discussed your allergic reaction. You have an allergy to something but I am unsure what. I have referred you to an allergy. Avoid hot showers. Takes zyrtec next time it happens. If you get shortness of breath, chest pain, dizziness etc.  Please follow-up with PCP.  If you have any questions or concerns, please do not hesitate to call the office at [phone].  Best wishes,   Dr Posey Pronto

## 2021-06-07 DIAGNOSIS — T7840XA Allergy, unspecified, initial encounter: Secondary | ICD-10-CM | POA: Insufficient documentation

## 2021-06-07 HISTORY — DX: Allergy, unspecified, initial encounter: T78.40XA

## 2021-06-07 MED ORDER — EPINEPHRINE 0.3 MG/0.3ML IJ SOAJ
0.3000 mg | INTRAMUSCULAR | 3 refills | Status: DC | PRN
Start: 2021-06-07 — End: 2021-06-16

## 2021-06-07 NOTE — Assessment & Plan Note (Signed)
Acute allergic reaction/urticaria which has now resolved. Hot showers seem to trigger the rash but pt likely has other triggers too which are unknown. Recommended zyrtec and avoiding known triggers. Recommended against self medicating with steroids at home and pt should follow up again if she continues to experience allergic reaction. Have provided her with epi pen too. Referred to allergist for testing.

## 2021-06-08 ENCOUNTER — Encounter: Payer: Self-pay | Admitting: Family Medicine

## 2021-06-08 ENCOUNTER — Ambulatory Visit: Payer: Self-pay

## 2021-06-08 ENCOUNTER — Ambulatory Visit (INDEPENDENT_AMBULATORY_CARE_PROVIDER_SITE_OTHER): Payer: Self-pay | Admitting: Family Medicine

## 2021-06-08 VITALS — BP 118/82 | Ht 62.0 in | Wt 167.0 lb

## 2021-06-08 DIAGNOSIS — M25531 Pain in right wrist: Secondary | ICD-10-CM

## 2021-06-08 NOTE — Progress Notes (Addendum)
PCP: Ezequiel Essex, MD  Subjective:   HPI: Patient is a 41 y.o. female here for right wrist pain. States that she noticed a knot in her dorsal wrist about 6 months ago but did not start to feel painful until 4-5 months ago. Denies any trauma or injury that she can recall but when she was using her arms to help her get up, she suddenly began to develop this pain so she was referred to by her PCP. She is right hand dominant so she feels that this pain really limits her movement. Describes pain as aching that starts at the wrist and radiates up her arm. States that the pain can be so severe that she feels like her arm is broken and sometimes hurts when she sleeps. Ice and tylenol or ibuprofen significantly helps the pain getting it close to resolution. She has not experienced any pain over the past week since she has been able to rest that side but typically gets pain with any movement of the wrist such as cutting vegetables during cooking. Otherwise, pain occurs relatively daily and is better with rest. She seems to notice that with more use, the cyst gets larger. She wears a wrist brace occasionally during the day but never at night.    Past Medical History:  Diagnosis Date   Allergy    Anxiety    ANXIETY 12/31/2008   Qualifier: Diagnosis of  By: Nadara Eaton  MD, Mickel Baas     Breast mass in female 8/46/9629   Complication of anesthesia    Problem waking up once and a headache   Condyloma acuminatum 07/21/2006   Qualifier: History of  By: Oneal Grout MD, Manuela Schwartz     Depression    Diabetes type 2, controlled ([place]) 04/02/2014   Family history of anesthesia complication    Mother had problem waking up   Hx gestational diabetes    Laryngitis 02/15/2020   Menorrhagia 07/03/2014   Molluscum contagiosum infection 12/01/2018   Muscle strain of lower leg, right, initial encounter 03/05/2019   Spinal headache    Thyroid fullness 04/02/2014   Vaginal lesion 11/13/2020   Voice hoarseness 05/22/2020    Current  Outpatient Medications on File Prior to Visit  Medication Sig Dispense Refill   cetirizine (ZYRTEC) 10 MG tablet Take 1 tablet (10 mg total) by mouth daily. 30 tablet 11   EPINEPHrine 0.3 mg/0.3 mL IJ SOAJ injection Inject 0.3 mg into the muscle as needed for anaphylaxis. 1 each 3   fluticasone (FLONASE) 50 MCG/ACT nasal spray Place 2 sprays into both nostrils daily. 16 g 6   ketorolac (ACULAR) 0.5 % ophthalmic solution 1 drop 2 (two) times daily.     metFORMIN (GLUCOPHAGE-XR) 500 MG 24 hr tablet Take 1 tablet (500 mg total) by mouth daily with breakfast. 90 tablet 3   predniSONE (DELTASONE) 20 MG tablet Take 2 tablets (40 mg total) by mouth daily with breakfast. 10 tablet 0   No current facility-administered medications on file prior to visit.    Past Surgical History:  Procedure Laterality Date   CESAREAN SECTION     CHOLECYSTECTOMY N/A 09/06/2013   Procedure: LAPAROSCOPIC CHOLECYSTECTOMY;  Surgeon: Harl Bowie, MD;  Location: [facility];  Service: General;  Laterality: N/A;   NASAL SINUS SURGERY  2003   TUBAL LIGATION      Allergies  Allergen Reactions   Amoxicillin Hives and Swelling   Penicillins Hives and Swelling    BP 118/82    Ht 5\' 2"  (1.575  m)    Wt 167 lb (75.8 kg)    BMI 30.54 kg/m   [facility] Adult Exercise 06/08/2021  Frequency of aerobic exercise (# of days/week) 0  Average time in minutes 0  Frequency of strengthening activities (# of days/week) 0    No flowsheet data found.      Objective:  Physical Exam:  Gen: NAD, comfortable in exam room MSK: presence of subcutaneous nodule along right dorsum of wrist about scapholunate region. Inspection reveals slight raised area on the dorsum of the wrist. No obvious edema or erythema.  Palpation reveals point tenderness upon deep palpation of the dorsum of the wrist. ROM: full active range of motion of wrists, fingers and elbows bilaterally. Special testing: negative Tinel's at cubital and carpal  tunnels and Finkelstein's testing  Neuro: sensation decreased along right wrist compared to left, gross sensation intact along forearm and shoulder bilaterally, 5/5 UE strength bilaterally, 5/5 grip strength bilaterally, 5/5 interosseus strength with the exception of 3rd and 4th digits on the right, 2+ brachioradialis and biceps reflex   Limited MSK u/s right wrist:  Small calcification noted within scapholunate ligament.  No separation on closed fist.  Anechoic area superficial to scapholunate ligament but without capsule, no obvious connection to joint about the wrist.   Assessment & Plan:  1. Right wrist pain: Possibly due to ganglion cyst however pain distribution seems less consistent with this. Ultrasound in the office concerning for swelling associated with scapholunate ligament possibly due to injury. Will obtain MRI to further assess for this. Patient instructed to continue conservative measures for now, application of ice and ibuprofen for pain as appropriate. Follow up after imaging results to discuss next steps. Consider aspiration if ganglion cyst is true etiology.

## 2021-06-08 NOTE — Patient Instructions (Signed)
It was great seeing you today!  Today we discussed your wrist pain, it seems that it may not be the cyst that is causing your pain. We want to make sure that you did not tear a ligament so we will get an MRI.   In the meantime, please wear the wrist brace especially at night. Continue using ice and taking ibuprofen for pain as needed.  Please follow up at your next scheduled appointment after the imaging has completed, if anything arises between now and then, please don't hesitate to contact our office.   Thank you for allowing [place] to be a part of your medical care!  Thank you, Dr. Larae Grooms and Dr. Barbaraann Barthel

## 2021-06-16 ENCOUNTER — Other Ambulatory Visit (INDEPENDENT_AMBULATORY_CARE_PROVIDER_SITE_OTHER): Payer: Self-pay

## 2021-06-16 ENCOUNTER — Ambulatory Visit (INDEPENDENT_AMBULATORY_CARE_PROVIDER_SITE_OTHER): Payer: Self-pay | Admitting: Gastroenterology

## 2021-06-16 ENCOUNTER — Encounter: Payer: Self-pay | Admitting: Gastroenterology

## 2021-06-16 VITALS — BP 124/78 | HR 74 | Ht 62.0 in | Wt 170.0 lb

## 2021-06-16 DIAGNOSIS — K9049 Malabsorption due to intolerance, not elsewhere classified: Secondary | ICD-10-CM

## 2021-06-16 DIAGNOSIS — K760 Fatty (change of) liver, not elsewhere classified: Secondary | ICD-10-CM

## 2021-06-16 DIAGNOSIS — K5900 Constipation, unspecified: Secondary | ICD-10-CM

## 2021-06-16 DIAGNOSIS — R1011 Right upper quadrant pain: Secondary | ICD-10-CM

## 2021-06-16 DIAGNOSIS — R140 Abdominal distension (gaseous): Secondary | ICD-10-CM

## 2021-06-16 LAB — H. PYLORI ANTIBODY, IGG: H Pylori IgG: NEGATIVE

## 2021-06-16 MED ORDER — LINACLOTIDE 72 MCG PO CAPS: 72.0000 ug | ORAL_CAPSULE | Freq: Every day | ORAL | 0 refills | Status: DC

## 2021-06-16 MED ORDER — OMEPRAZOLE 40 MG PO CPDR: 40.0000 mg | DELAYED_RELEASE_CAPSULE | Freq: Every day | ORAL | 1 refills | Status: DC

## 2021-06-16 NOTE — Progress Notes (Signed)
HPI :  41 year old female with a history of cholecystectomy, abdominal pain, altered bowel habits, referred here by Sherren Mocha Mcdiarmid for further evaluation of the symptoms.  The patient states she had her gallbladder removed approximately in 2015.  She states for the past year or so she has had some persistent discomfort in her right upper quadrant.  States it is sore there and has an ongoing tenderness to the area.  States she feels like it is there all the time but can have fluctuations in severity.  She does feel like eating can make it worse and it will feel "hard" in the area.  She states that lying on her left side can sometimes make things worse and changing position can make things better when sleeping.  She denies any reflux symptoms.  She has some nausea but no vomiting.  She denies any early satiety.  Not using any NSAIDs.  Eating greasy or heavy foods or fatty foods can make her symptoms worse and cause gas.  She tries to avoid certain foods that can trigger her symptoms.  She has prediabetes and on metformin.  No unexpected weight loss.  No blood in the stools.  She has a bowel movement anywhere from daily to 3 times per week, has occasional straining.  She has tried using a fiber supplement for her bowels which has not really helped too much.  She is MiraLAX once to twice daily and did not think it helped much.  She has never had an EGD or colonoscopy in the past.  No family history of colon cancer or stomach cancer.  She had a CT scan of her abdomen pelvis in December which showed some hepatomegaly with hepatic steatosis, stable to a prior exam noted in 2010.  There was no clear cause for her symptoms on the CT scan.  Her colon appeared to be containing large burden of stool.  I take care of her husband in my clinic.  He has a history of H. pylori, she has not had prior H. pylori testing.  She has not tried any antacids yet for her symptoms.  LFTs in the past have been okay/normal.  She  does not drink alcohol on a routine basis    CT abdomen / pelvis without contrast 04/03/21: IMPRESSION: 1. Hepatomegaly with suspected mild hepatic steatosis, similar to remote examination performed in 2010. Otherwise, no explanation for patient's palpable right upper abdominal mass. 2. Post cholecystectomy. 3. Large colonic stool burden without evidence of enteric obstruction.   LFTs - AST 24, AST 19 - 03/25/21   Past Medical History:  Diagnosis Date   Allergy    ANXIETY 12/31/2008   Qualifier: Diagnosis of  By: Nadara Eaton  MD, Mickel Baas     Breast mass in female 14/48/1856   Complication of anesthesia    Problem waking up once and a headache   Condyloma acuminatum 07/21/2006   Qualifier: History of  By: Oneal Grout MD, Manuela Schwartz     Depression    Diabetes type 2, controlled ([place]) 04/02/2014   Family history of anesthesia complication    Mother had problem waking up   Hx gestational diabetes    Laryngitis 02/15/2020   Menorrhagia 07/03/2014   Molluscum contagiosum infection 12/01/2018   Muscle strain of lower leg, right, initial encounter 03/05/2019   Spinal headache    Thyroid fullness 04/02/2014   Vaginal lesion 11/13/2020   Voice hoarseness 05/22/2020     Past Surgical History:  Procedure Laterality Date   CESAREAN  SECTION     CHOLECYSTECTOMY N/A 09/06/2013   Procedure: LAPAROSCOPIC CHOLECYSTECTOMY;  Surgeon: Harl Bowie, MD;  Location: [facility];  Service: General;  Laterality: N/A;   NASAL SINUS SURGERY  2003   TUBAL LIGATION     Family History  Problem Relation Age of Onset   Hypertension Mother    Hyperlipidemia Mother    Cervical cancer Mother    Diabetes Father    Hypertension Father    Irregular heart beat Father    Throat cancer Maternal Grandmother    Prostate cancer Maternal Grandfather    Breast cancer Paternal Aunt    Diabetes Other    Stomach cancer Neg Hx    Colon cancer Neg Hx    Esophageal cancer Neg Hx    Pancreatic cancer Neg Hx    Social  History   Tobacco Use   Smoking status: Never   Smokeless tobacco: Never  Vaping Use   Vaping Use: Never used  Substance Use Topics   Alcohol use: Yes    Comment: occ.   Drug use: No   Current Outpatient Medications  Medication Sig Dispense Refill   cetirizine (ZYRTEC) 10 MG tablet Take 1 tablet (10 mg total) by mouth daily. 30 tablet 11   fluticasone (FLONASE) 50 MCG/ACT nasal spray Place 2 sprays into both nostrils daily. 16 g 6   ketorolac (ACULAR) 0.5 % ophthalmic solution 1 drop 2 (two) times daily.     metFORMIN (GLUCOPHAGE-XR) 500 MG 24 hr tablet Take 1 tablet (500 mg total) by mouth daily with breakfast. 90 tablet 3   predniSONE (DELTASONE) 20 MG tablet Take 2 tablets (40 mg total) by mouth daily with breakfast. 10 tablet 0   No current facility-administered medications for this visit.   Allergies  Allergen Reactions   Amoxicillin Hives and Swelling   Penicillins Hives and Swelling     Review of Systems: All systems reviewed and negative except where noted in HPI.    Lab Results  Component Value Date   WBC 9.2 03/25/2021   HGB 12.7 03/25/2021   HCT 36.5 03/25/2021   MCV 92 03/25/2021   PLT 324 03/25/2021    Lab Results  Component Value Date   CREATININE 0.72 03/25/2021   BUN 10 03/25/2021   NA 136 03/25/2021   K 4.0 03/25/2021   CL 100 03/25/2021   CO2 23 03/25/2021    Lab Results  Component Value Date   ALT 24 03/25/2021   AST 19 03/25/2021   ALKPHOS 104 03/25/2021   BILITOT <0.2 03/25/2021     Physical Exam: BP 124/78    Pulse 74    Ht 5\' 2"  (1.575 m)    Wt 170 lb (77.1 kg)    SpO2 98%    BMI 31.09 kg/m  Constitutional: Pleasant,well-developed, female in no acute distress. HEENT: Normocephalic and atraumatic. Conjunctivae are normal. No scleral icterus. Neck supple.  Cardiovascular: Normal rate, regular rhythm.  Pulmonary/chest: Effort normal and breath sounds normal. No wheezing, rales or rhonchi. Abdominal: Soft, nondistended, RUQ TTP  without rebound or guarding, appears to have positive Carnett sign. There are no masses palpable.  Extremities: no edema Lymphadenopathy: No cervical adenopathy noted. Neurological: Alert and oriented to person place and time. Skin: Skin is warm and dry. No rashes noted. Psychiatric: Normal mood and affect. Behavior is normal.   ASSESSMENT AND PLAN: 41 year old female here for new patient assessment the following:  Right upper quadrant pain Food intolerance Bloating Constipation Fatty liver  As above, history of cholecystectomy with what appears to be chronic right upper quadrant pain for the past year.  Appears constant as described with fluctuations in severity, can be worse with eating but also tender to palpation and positional component.  Her exam is more consistent with abdominal wall pain, nerve entrapment, suspect that may be the cause although it would be unusual for this to be worse with eating.  We discussed options.  We will try her empirically on omeprazole 40 mg once daily for 30 days and see if she finds any benefit.  I will screen her for H. pylori with an IgG serology and treat if positive.  If her symptoms persist despite this we may consider EGD and/or consider trigger point injection to the abdominal wall pending her course if thought to be more abdominal wall pain.  She has some baseline constipation which I think is probably causing her bloating.  She has not responded to MiraLAX in the past or fiber.  Give her some samples of Linzess 72 and 145 mcg caps to try samples and see how she does.  If she likes this we can provide a prescription.  No alarm symptoms otherwise, labs are reassuring.  Counseled her on fatty liver, noted on imaging it appears chronic, her LFTs are normal.  She does not drink much alcohol, will need to watch LFTs moving forward, considering the risks for fibrosis/cirrhosis. Weight loss over time will help.  Plan: - start omeprazole 40mg  / day for 30  days - H pylori IgG, treat if positive - consider EGD and or trigger point injection if no better - treat constipation - Linzess samples of the 72.36mcg and 14mcg / day trial - can use higher dosing of Miralax otherwise as needed - counseled on fatty liver, yearly LFTs, work on weight loss  Jolly Mango, MD [facility]  CC: McDiarmid, Blane Ohara, MD

## 2021-06-16 NOTE — Patient Instructions (Addendum)
If you are age 41 or older, your body mass index should be between 23-30. Your Body mass index is 31.09 kg/m. If this is out of the aforementioned range listed, please consider follow up with your Primary Care Provider.  If you are age 33 or younger, your body mass index should be between 19-25. Your Body mass index is 31.09 kg/m. If this is out of the aformentioned range listed, please consider follow up with your Primary Care Provider.   ________________________________________________________  The [facility] providers would like to encourage you to use [facility] to communicate with providers for non-urgent requests or questions.  Due to long hold times on the telephone, sending your provider a message by [facility] may be a faster and more efficient way to get a response.  Please allow 48 business hours for a response.  Please remember that this is for non-urgent requests.  _______________________________________________________  We have sent the following medications to your pharmacy for you to pick up at your convenience: Omeprazole 40 mg: Take once daily   Please go to the lab in the basement of our building to have lab work done as you leave today. Hit "B" for basement when you get on the elevator.  When the doors open the lab is on your left.  We will call you with the results. Thank you.  We are giving you samples of Linzess 72 mcg to be used for constipation.  Take one capsule in the morning before breakfast.  Try for 3 days.  If not effective, take 2 capsules in the morning before breakfast.  Please let us know if you would like a prescription (and make sure to indicate if you needed 1 or 2 capsules to get to the desired results.  Please send [place] a MyChart message in a couple of weeks and let us know who you are doing.  Thank you for entrusting me with your care and for choosing [facility], Dr. Fenwood Cellar

## 2021-06-17 ENCOUNTER — Encounter: Payer: Self-pay | Admitting: Family Medicine

## 2021-06-17 NOTE — Telephone Encounter (Signed)
Called patient. I have scheduled MRI appointment for 06/28/21 at [facility], arrival time 11:50. We were able to use order from 10/2020, as patient was unable to obtain when previously ordered due to insurance.   Discussed red flags with patient. Patient reports that symptoms are mostly the same and have not worsened since last visit. However, does feel that vertigo symptoms have slightly worsened.   ED precautions given.   Talbot Grumbling, RN

## 2021-06-18 ENCOUNTER — Ambulatory Visit
Admission: RE | Admit: 2021-06-18 | Discharge: 2021-06-18 | Disposition: A | Discharge status: Home / Self Care | Payer: No Typology Code available for payment source | Source: Ambulatory Visit | Attending: Family Medicine | Admitting: Family Medicine

## 2021-06-18 ENCOUNTER — Encounter: Payer: Self-pay | Admitting: Allergy

## 2021-06-18 ENCOUNTER — Other Ambulatory Visit: Payer: Self-pay

## 2021-06-18 ENCOUNTER — Ambulatory Visit (INDEPENDENT_AMBULATORY_CARE_PROVIDER_SITE_OTHER): Payer: Self-pay | Admitting: Allergy

## 2021-06-18 VITALS — BP 128/80 | HR 83 | Temp 98.0°F | Ht 62.0 in | Wt 172.4 lb

## 2021-06-18 DIAGNOSIS — J3089 Other allergic rhinitis: Secondary | ICD-10-CM

## 2021-06-18 DIAGNOSIS — T781XXD Other adverse food reactions, not elsewhere classified, subsequent encounter: Secondary | ICD-10-CM

## 2021-06-18 DIAGNOSIS — L505 Cholinergic urticaria: Secondary | ICD-10-CM

## 2021-06-18 DIAGNOSIS — L509 Urticaria, unspecified: Secondary | ICD-10-CM

## 2021-06-18 DIAGNOSIS — H1013 Acute atopic conjunctivitis, bilateral: Secondary | ICD-10-CM

## 2021-06-18 DIAGNOSIS — M25531 Pain in right wrist: Secondary | ICD-10-CM

## 2021-06-18 MED ORDER — OLOPATADINE HCL 0.2 % OP SOLN: 1.0000 [drp] | Freq: Two times a day (BID) | OPHTHALMIC | 5 refills | Status: DC | PRN

## 2021-06-18 MED ORDER — EPINEPHRINE 0.3 MG/0.3ML IJ SOAJ: 0.3000 mg | Freq: Once | INTRAMUSCULAR | 1 refills | Status: AC

## 2021-06-18 MED ORDER — LEVOCETIRIZINE DIHYDROCHLORIDE 5 MG PO TABS: 5.0000 mg | ORAL_TABLET | Freq: Two times a day (BID) | ORAL | 5 refills | Status: DC

## 2021-06-18 MED ORDER — FLUTICASONE PROPIONATE 50 MCG/ACT NA SUSP: 2.0000 | Freq: Every day | NASAL | 5 refills | Status: AC

## 2021-06-18 MED ORDER — FAMOTIDINE 20 MG PO TABS: 20.0000 mg | ORAL_TABLET | Freq: Two times a day (BID) | ORAL | 5 refills | Status: DC

## 2021-06-18 NOTE — Progress Notes (Signed)
New Patient Note  RE: RONDA RAJKUMAR MRN: [id number] DOB: 1981/02/05 Date of Office Visit: 06/18/2021  Referring provider: Martyn Malay, MD Primary care provider: Ezequiel Essex, MD  Chief Complaint: allergies (medications, foods, environmental)  History of present illness: [name] is a 41 y.o. female presenting today for consultation for allergies.   She may need surgery on her wrist.  She states she was told due to her penicillin allergy she would be limited for treatment if needed for surgery.   She reports she had a penicillin antibiotic in the past.  She states when she had one of her kids she developed a uterine infection. She states they used a penicillin medication and she developed a itchy/hive like rash after use.  Her daughter is now 14 years.  She states she was given a penicillin antibiotic again about 11 years ago and she had similar rash developed.  She states she was told not take penicillin antibiotic again.    She also states when she has taken ibuprofen she developed facial swelling and hives all over.  She recalls she had ankle surgery in 2016 and the 2nd or 3rd day she took ibuprofen as well as percocet around the same time.  She states however she has had ibuprofen within the past year without issue so she feels her symptoms were likely due to percocet medication as she states she did take more than she recommended due to pain she was having.    She is concerned for food allergy.  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  She is from [place] and use to eat a lot of seafood.  Recently was cleaning crab and she states she was stung by the crab claw and her hand got swollen, red and heavy and numb feeling.  Later she states she ate some of the soup with the seafood in it and she felt like her throat was getting tight and was having tongue tingling.  She also reports facial swelling.  She states she did take benadryl.  She states when she eats green foods like green salsa  (with tomatillo, cilantro) with avocado, steak, tortilla and at this particular time was also drinking beer she developed itching and facial swelling.  She states another time she had pork with green cilantro, salsa and the next day she states her face was swollen.  She also states when she eats something green she gets itchy and her stomach gets big and painful.  This can include broccoli, green cactus, cucumber (if she eats a lot), lime, lettuce, tomatillos, zucchini.  With dairy/milk she states her stomach gets big and has had diarrhea.   With bread/pastas and gluten products she develops itchiness, hives, skin feels flushed, stomach gets big and painful.    She does have a brother with a shellfish allergy.   She has heat-induced hives.  She states during summer time or when she is too hot she has had hives and facial swelling.    She reports during pollen season she gets itchy/watery eyes, runny nose, hoarseness and a sore throat that feels swollen.  She states during pandemic when wearing the mask outside symptoms were better.  She has been taking zyrtec for long time but states was changed to xyzal.  She does feel xyzal works better and uses as needed.  It does make her sleepy thus will use at nighttime.   She did get an eyedrop but states its not an allergy eyedrop.  She does use flonase that helps.    She has no history of asthma.   Review of systems: Review of Systems  Constitutional: Negative.   HENT: Negative.    Eyes: Negative.   Respiratory: Negative.    Cardiovascular: Negative.   Gastrointestinal: Negative.   Musculoskeletal: Negative.   Skin: Negative.   Allergic/Immunologic: Negative.   Neurological: Negative.    All other systems negative unless noted above in HPI  Past medical history: Past Medical History:  Diagnosis Date   Allergy    Angio-edema    ANXIETY 12/31/2008   Qualifier: Diagnosis of  By: Nadara Eaton  MD, Mickel Baas     Breast mass in female 54/56/2563    Complication of anesthesia    Problem waking up once and a headache   Condyloma acuminatum 07/21/2006   Qualifier: History of  By: Oneal Grout MD, Manuela Schwartz     Depression    Diabetes type 2, controlled ([place]) 04/02/2014   Eczema    Family history of anesthesia complication    Mother had problem waking up   Hx gestational diabetes    Laryngitis 02/15/2020   Menorrhagia 07/03/2014   Molluscum contagiosum infection 12/01/2018   Muscle strain of lower leg, right, initial encounter 03/05/2019   Spinal headache    Thyroid fullness 04/02/2014   Urticaria    Vaginal lesion 11/13/2020   Voice hoarseness 05/22/2020    Past surgical history: Past Surgical History:  Procedure Laterality Date   CESAREAN SECTION     CHOLECYSTECTOMY N/A 09/06/2013   Procedure: LAPAROSCOPIC CHOLECYSTECTOMY;  Surgeon: Harl Bowie, MD;  Location: [facility];  Service: General;  Laterality: N/A;   NASAL SINUS SURGERY  2003   TUBAL LIGATION      Family history:  Family History  Problem Relation Age of Onset   Hypertension Mother    Hyperlipidemia Mother    Cervical cancer Mother    Diabetes Father    Hypertension Father    Irregular heart beat Father    Breast cancer Paternal Aunt    Throat cancer Maternal Grandmother    Prostate cancer Maternal Grandfather    Diabetes Other    Allergic rhinitis Daughter    Allergic rhinitis Daughter    Allergic rhinitis Son        milk   Stomach cancer Neg Hx    Colon cancer Neg Hx    Esophageal cancer Neg Hx    Pancreatic cancer Neg Hx     Social history: Lives in a home without carpeting with gas heating and central cooling.  Dogs in the home.  No concern for water damage, mildew or roaches in the home.  Does not report an occupation.  Denies smoking history.    Medication List: Current Outpatient Medications  Medication Sig Dispense Refill   cetirizine (ZYRTEC) 10 MG tablet Take 1 tablet (10 mg total) by mouth daily. 30 tablet 11   EPINEPHrine (EPIPEN 2-PAK)  0.3 mg/0.3 mL IJ SOAJ injection Inject 0.3 mg into the muscle once for 1 dose. 0.3 mL 1   famotidine (PEPCID) 20 MG tablet Take 1 tablet (20 mg total) by mouth 2 (two) times daily. 60 tablet 5   fluticasone (FLONASE) 50 MCG/ACT nasal spray Place 2 sprays into both nostrils daily. 16 g 6   fluticasone (FLONASE) 50 MCG/ACT nasal spray Place 2 sprays into both nostrils daily. 16 g 5   ketorolac (ACULAR) 0.5 % ophthalmic solution 1 drop 2 (two) times daily.     levocetirizine (XYZAL)  5 MG tablet Take 1 tablet (5 mg total) by mouth in the morning and at bedtime. 60 tablet 5   metFORMIN (GLUCOPHAGE-XR) 500 MG 24 hr tablet Take 1 tablet (500 mg total) by mouth daily with breakfast. 90 tablet 3   Olopatadine HCl (PATADAY) 0.2 % SOLN Place 1 drop into both eyes 2 (two) times daily as needed. 2.5 mL 5   linaclotide (LINZESS) 72 MCG capsule Take 1 capsule (72 mcg total) by mouth daily before breakfast. [id number], EXP: 03-2022 (Patient not taking: Reported on 06/18/2021) 16 capsule 0   omeprazole (PRILOSEC) 40 MG capsule Take 1 capsule (40 mg total) by mouth daily. (Patient not taking: Reported on 06/18/2021) 30 capsule 1   predniSONE (DELTASONE) 20 MG tablet Take 2 tablets (40 mg total) by mouth daily with breakfast. (Patient not taking: Reported on 06/18/2021) 10 tablet 0   No current facility-administered medications for this visit.    Known medication allergies: Allergies  Allergen Reactions   Amoxicillin Hives and Swelling   Penicillins Hives and Swelling     Physical examination: Blood pressure 128/80, pulse 83, temperature 98 F (36.7 C), temperature source Temporal, height '5\' 2"'  (1.575 m), weight 172 lb 6.4 oz (78.2 kg), SpO2 98 %.  General: Alert, interactive, in no acute distress. HEENT: PERRLA, TMs pearly gray, turbinates minimally edematous without discharge, post-pharynx non erythematous. Neck: Supple without lymphadenopathy. Lungs: Clear to auscultation without wheezing, rhonchi or rales.  {no increased work of breathing. CV: Normal S1, S2 without murmurs. Abdomen: Nondistended, nontender. Skin: Warm and dry, without lesions or rashes. Extremities:  No clubbing, cyanosis or edema. Neuro:   Grossly intact.  Diagnositics/Labs: Labs:  Component     Latest Ref Rng & Units 03/25/2021 06/16/2021  Glucose     70 - 99 mg/dL 143 (H)   BUN     6 - 24 mg/dL 10   Creatinine     0.57 - 1.00 mg/dL 0.72   eGFR     >59 mL/min/1.73 108   BUN/Creatinine Ratio     9 - 23 14   Sodium     134 - 144 mmol/L 136   Potassium     3.5 - 5.2 mmol/L 4.0   Chloride     96 - 106 mmol/L 100   CO2     20 - 29 mmol/L 23   Calcium     8.7 - 10.2 mg/dL 9.5   Total Protein     6.0 - 8.5 g/dL 7.6   Albumin     3.8 - 4.8 g/dL 4.3   Globulin, Total     1.5 - 4.5 g/dL 3.3   Albumin/Globulin Ratio     1.2 - 2.2 1.3   Total Bilirubin     0.0 - 1.2 mg/dL <0.2   Alkaline Phosphatase     44 - 121 IU/L 104   AST     0 - 40 IU/L 19   ALT     0 - 32 IU/L 24   WBC     3.4 - 10.8 x10E3/uL 9.2   RBC     3.77 - 5.28 x10E6/uL 3.95   Hemoglobin     11.1 - 15.9 g/dL 12.7   HCT     34.0 - 46.6 % 36.5   MCV     79 - 97 fL 92   MCH     26.6 - 33.0 pg 32.2   MCHC     31.5 - 35.7 g/dL 34.8  RDW     11.7 - 15.4 % 12.3   Platelets     150 - 450 x10E3/uL 324   H. pylori, IgG Abs     Negative  Negative    Allergy testing:   Airborne Adult Perc - 06/18/21 1439     Time Antigen Placed 1444    Allergen Manufacturer Lavella Hammock    Location Arm    Number of Test 59    Panel 1 Select    1. Control-Buffer 50% Glycerol Negative    2. Control-Histamine 1 mg/ml 2+    3. Albumin saline Negative    4. [place] 2+    5. [place] Negative    6. Johnson Negative    7. Galva Blue Negative    8. Meadow Fescue Negative    9. Perennial Rye Negative    10. Sweet Vernal Negative    11. Timothy Negative    12. Cocklebur Negative    13. Burweed Marshelder Negative    14. Ragweed, short Negative    15.  Ragweed, Giant Negative    16. Plantain,  English Negative    17. Lamb's Quarters Negative    18. Sheep Sorrell Negative    19. Rough Pigweed Negative    20. Marsh Elder, Rough Negative    21. Mugwort, Common Negative    22. Ash mix Negative    23. Birch mix Negative    24. Beech American Negative    25. Box, Elder Negative    26. Cedar, red Negative    27. Cottonwood, [place] Negative    28. Elm mix Negative    29. Hickory 4+    30. Maple mix Negative    31. Oak, [place] mix Negative    32. Pecan Pollen Negative    33. Pine mix Negative    34. Sycamore Eastern Negative    35. Medina, Black Pollen Negative    36. Alternaria alternata Negative    37. Cladosporium Herbarum Negative    38. Aspergillus mix Negative    39. Penicillium mix Negative    40. Bipolaris sorokiniana (Helminthosporium) Negative    41. Drechslera spicifera (Curvularia) Negative    42. Mucor plumbeus Negative    43. Fusarium moniliforme Negative    44. Aureobasidium pullulans (pullulara) Negative    45. Rhizopus oryzae Negative    46. Botrytis cinera Negative    47. Epicoccum nigrum Negative    48. Phoma betae 2+    49. Candida Albicans Negative    50. Trichophyton mentagrophytes Negative    51. Mite, D Farinae  5,000 AU/ml Negative    52. Mite, D Pteronyssinus  5,000 AU/ml 3+    53. Cat Hair 10,000 BAU/ml Negative    54.  Dog Epithelia Negative    55. Mixed Feathers Negative    56. Horse Epithelia Negative    57. Cockroach, German Negative    58. Mouse Negative    59. Tobacco Leaf Negative             Food Adult Perc - 06/18/21 1400     Time Antigen Placed 1445    Allergen Manufacturer Lavella Hammock    Location Back    Number of allergen test 23     Control-buffer 50% Glycerol Negative    Control-Histamine 1 mg/ml Negative    3. Wheat Negative    18. Catfish Negative    19. Bass Negative    20. Trout Negative    22. Salmon Negative  23. Flounder Negative    24. Codfish Negative    25.  Shrimp 2+    26. Crab Negative    27. Lobster Negative    28. Oyster Negative    29. Scallops Negative    30. Barley Negative    31. Oat  Negative    32. Rye  Negative    34. Rice Negative    42. Tomato Negative    45. Pea, Green/English Negative    48. Avocado Negative    50. Cabbage Negative    52. Celery Negative    54. Cucumber Negative             Allergy testing results were read and interpreted by provider, documented by clinical staff.   Assessment and plan: Environmental allergy - Testing today showed: grasses, trees, outdoor molds, and dust mites. - Copy of test results provided.  - Avoidance measures provided. - Continue with: Xyzal (levocetirizine) 57m tablet once daily - Start taking: Flonase (fluticasone) two sprays per nostril daily (AIM FOR EAR ON EACH SIDE) daily for 1-2 weeks at a time before stopping once nasal congestion improves for maximum benefit Pataday (olopatadine) one drop per eye twice daily as needed - You can use an extra dose of the antihistamine, if needed, for breakthrough symptoms.  - Consider nasal saline rinses 1-2 times daily to remove allergens from the nasal cavities as well as help with mucous clearance (this is especially helpful to do before the nasal sprays are given) - Consider allergy shots as a means of long-term control. - Allergy shots "re-train" and "reset" the immune system to ignore environmental allergens and decrease the resulting immune response to those allergens (sneezing, itchy watery eyes, runny nose, nasal congestion, etc).    - Allergy shots improve symptoms in 75-85% of patients.  - We can discuss more at the next appointment if the medications are not working for you.  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  Shrimp and other crustacean shellfish (lobster and crab) are cross-reactive - continue avoidance of shellfish in diet for now - will obtain labwork for the foods that were negative  on skin testing today including gluten, fish panel, green vegetables - have access to self-injectable epinephrine (Epipen or AuviQ) 0.326mat all times - follow emergency action plan in case of allergic reaction  - we have discussed the following in regards to foods:   Allergy: food allergy is when you have eaten a food, developed an allergic reaction after eating the food and have IgE to the food (positive food testing either by skin testing or blood testing).  Food allergy could lead to life threatening symptoms  Sensitivity: occurs when you have IgE to a food (positive food testing either by skin testing or blood testing) but is a food you eat without any issues.  This is not an allergy and we recommend keeping the food in the diet  Intolerance: this is when you have negative testing by either skin testing or blood testing thus not allergic but the food causes symptoms (like belly pain, bloating, diarrhea etc) with ingestion.  These foods should be avoided to prevent symptoms.     Hives - at this time etiology of hives and swelling is unknown.  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  Hives can be caused by a variety of different triggers including illness/infection, foods, medications, stings, exercise, pressure, vibrations, extremes  of temperature to name a few however majority of the time there is no identifiable trigger.  Your symptoms have been ongoing for >6 weeks making this chronic thus will obtain additional labwork to evaluate: tryptase, hive panel, alpha-gal panel - for hive control recommend the following high-dose antihistamine regimen: Xyzal 34m 1 tab twice a day with Pepcid 235m1 tab twice a day until hives have resolved - if high-dose antihistamine regimen is not effective enough then would consider monthly Xolair injections for better control.  We can discuss this therapy in more detail if needed in future.    Medication allergy -she has listed penicillin allergy.   -recommend performing oral penicillin challenge in office.  You will need to hold all antihistamines for 3 days prior to this visit.  -ibuprofen you tolerate without issue thus ok to continue as needed use of this medication -opiate based medications like percocet are known to cause itching, hives and/or swelling as a side effect.  You likely can tolerate these medications if you are pre-medicated with high-dose antihistamine regimen as below under hives.  If you are going require opiate based medications in future would start high-dose antihistamine about 3 days prior to use and continue while on these medications.    Schedule for oral challenge to penicillin  I appreciate the opportunity to take part in Lakima's care. Please do not hesitate to contact me with questions.  Sincerely,   ShPrudy FeelerMD Allergy/Immunology [facility]

## 2021-06-18 NOTE — Patient Instructions (Signed)
Environmental allergy - Testing today showed: grasses, trees, outdoor molds, and dust mites. - Copy of test results provided.  - Avoidance measures provided. - Continue with: Xyzal (levocetirizine) 5mg  tablet once daily - Start taking: Flonase (fluticasone) two sprays per nostril daily (AIM FOR EAR ON EACH SIDE) daily for 1-2 weeks at a time before stopping once nasal congestion improves for maximum benefit Pataday (olopatadine) one drop per eye twice daily as needed - You can use an extra dose of the antihistamine, if needed, for breakthrough symptoms.  - Consider nasal saline rinses 1-2 times daily to remove allergens from the nasal cavities as well as help with mucous clearance (this is especially helpful to do before the nasal sprays are given) - Consider allergy shots as a means of long-term control. - Allergy shots "re-train" and "reset" the immune system to ignore environmental allergens and decrease the resulting immune response to those allergens (sneezing, itchy watery eyes, runny nose, nasal congestion, etc).    - Allergy shots improve symptoms in 75-85% of patients.  - We can discuss more at the next appointment if the medications are not working for you.  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  Shrimp and other crustacean shellfish (lobster and crab) are cross-reactive - continue avoidance of shellfish in diet for now - will obtain labwork for the foods that were negative on skin testing today including gluten, fish panel, green vegetables - have access to self-injectable epinephrine (Epipen or AuviQ) 0.3mg  at all times - follow emergency action plan in case of allergic reaction  - we have discussed the following in regards to foods:   Allergy: food allergy is when you have eaten a food, developed an allergic reaction after eating the food and have IgE to the food (positive food testing either by skin testing or blood testing).  Food allergy could lead  to life threatening symptoms  Sensitivity: occurs when you have IgE to a food (positive food testing either by skin testing or blood testing) but is a food you eat without any issues.  This is not an allergy and we recommend keeping the food in the diet  Intolerance: this is when you have negative testing by either skin testing or blood testing thus not allergic but the food causes symptoms (like belly pain, bloating, diarrhea etc) with ingestion.  These foods should be avoided to prevent symptoms.     Hives - at this time etiology of hives and swelling is unknown.  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  Hives can be caused by a variety of different triggers including illness/infection, foods, medications, stings, exercise, pressure, vibrations, extremes of temperature to name a few however majority of the time there is no identifiable trigger.  Your symptoms have been ongoing for >6 weeks making this chronic thus will obtain additional labwork to evaluate: tryptase, hive panel, alpha-gal panel - for hive control recommend the following high-dose antihistamine regimen: Xyzal 5mg  1 tab twice a day with Pepcid 20mg  1 tab twice a day until hives have resolved - if high-dose antihistamine regimen is not effective enough then would consider monthly Xolair injections for better control.  We can discuss this therapy in more detail if needed in future.   Medication allergy -she has listed penicillin allergy.   -recommend performing oral penicillin challenge in office.  You will need to hold all antihistamines for 3 days prior to this visit.  -  ibuprofen you tolerate without issue thus ok to continue as needed use of this medication -opiate based medications like percocet are known to cause itching, hives and/or swelling as a side effect.  You likely can tolerate these medications if you are pre-medicated with high-dose antihistamine regimen as  below under hives.  If you are going require opiate based medications in future would start high-dose antihistamine about 3 days prior to use and continue while on these medications.    Schedule for oral challenge to penicillin

## 2021-06-22 ENCOUNTER — Encounter: Payer: Self-pay | Admitting: Family Medicine

## 2021-06-22 ENCOUNTER — Ambulatory Visit (INDEPENDENT_AMBULATORY_CARE_PROVIDER_SITE_OTHER): Payer: Self-pay | Admitting: Family Medicine

## 2021-06-22 ENCOUNTER — Other Ambulatory Visit: Payer: Self-pay

## 2021-06-22 VITALS — BP 132/94 | Ht 62.0 in | Wt 170.0 lb

## 2021-06-22 DIAGNOSIS — M25531 Pain in right wrist: Secondary | ICD-10-CM

## 2021-06-22 NOTE — Progress Notes (Signed)
PCP: Ezequiel Essex, MD  Subjective:   HPI: Patient is a 41 y.o. female here for right wrist pain.  1/16: Patient is a 41 y.o. female here for right wrist pain. States that she noticed a knot in her dorsal wrist about 6 months ago but did not start to feel painful until 4-5 months ago. Denies any trauma or injury that she can recall but when she was using her arms to help her get up, she suddenly began to develop this pain so she was referred to by her PCP. She is right hand dominant so she feels that this pain really limits her movement. Describes pain as aching that starts at the wrist and radiates up her arm. States that the pain can be so severe that she feels like her arm is broken and sometimes hurts when she sleeps. Ice and tylenol or ibuprofen significantly helps the pain getting it close to resolution. She has not experienced any pain over the past week since she has been able to rest that side but typically gets pain with any movement of the wrist such as cutting vegetables during cooking. Otherwise, pain occurs relatively daily and is better with rest. She seems to notice that with more use, the cyst gets larger. She wears a wrist brace occasionally during the day but never at night.   1/30: Patient returns to review MRI. Reports continued pain dorsal right wrist (started with this) and swelling. Pain has started to affect her forearm dorsally, radiating into upper arm without neck pain. Pain worse with motions of wrist, trying to push up. No numbness/tingling. No neck pain.  Past Medical History:  Diagnosis Date   Allergy    Angio-edema    ANXIETY 12/31/2008   Qualifier: Diagnosis of  By: Nadara Eaton  MD, Mickel Baas     Breast mass in female 16/02/9603   Complication of anesthesia    Problem waking up once and a headache   Condyloma acuminatum 07/21/2006   Qualifier: History of  By: Oneal Grout MD, Manuela Schwartz     Depression    Diabetes type 2, controlled ([place]) 04/02/2014   Eczema     Family history of anesthesia complication    Mother had problem waking up   Hx gestational diabetes    Laryngitis 02/15/2020   Menorrhagia 07/03/2014   Molluscum contagiosum infection 12/01/2018   Muscle strain of lower leg, right, initial encounter 03/05/2019   Spinal headache    Thyroid fullness 04/02/2014   Urticaria    Vaginal lesion 11/13/2020   Voice hoarseness 05/22/2020    Current Outpatient Medications on File Prior to Visit  Medication Sig Dispense Refill   cetirizine (ZYRTEC) 10 MG tablet Take 1 tablet (10 mg total) by mouth daily. 30 tablet 11   famotidine (PEPCID) 20 MG tablet Take 1 tablet (20 mg total) by mouth 2 (two) times daily. 60 tablet 5   fluticasone (FLONASE) 50 MCG/ACT nasal spray Place 2 sprays into both nostrils daily. 16 g 6   fluticasone (FLONASE) 50 MCG/ACT nasal spray Place 2 sprays into both nostrils daily. 16 g 5   ketorolac (ACULAR) 0.5 % ophthalmic solution 1 drop 2 (two) times daily.     levocetirizine (XYZAL) 5 MG tablet Take 1 tablet (5 mg total) by mouth in the morning and at bedtime. 60 tablet 5   linaclotide (LINZESS) 72 MCG capsule Take 1 capsule (72 mcg total) by mouth daily before breakfast. [id number], EXP: 03-2022 (Patient not taking: Reported on 06/18/2021) 16 capsule 0  metFORMIN (GLUCOPHAGE-XR) 500 MG 24 hr tablet Take 1 tablet (500 mg total) by mouth daily with breakfast. 90 tablet 3   Olopatadine HCl (PATADAY) 0.2 % SOLN Place 1 drop into both eyes 2 (two) times daily as needed. 2.5 mL 5   omeprazole (PRILOSEC) 40 MG capsule Take 1 capsule (40 mg total) by mouth daily. (Patient not taking: Reported on 06/18/2021) 30 capsule 1   predniSONE (DELTASONE) 20 MG tablet Take 2 tablets (40 mg total) by mouth daily with breakfast. (Patient not taking: Reported on 06/18/2021) 10 tablet 0   No current facility-administered medications on file prior to visit.    Past Surgical History:  Procedure Laterality Date   CESAREAN SECTION     CHOLECYSTECTOMY  N/A 09/06/2013   Procedure: LAPAROSCOPIC CHOLECYSTECTOMY;  Surgeon: Harl Bowie, MD;  Location: [facility];  Service: General;  Laterality: N/A;   NASAL SINUS SURGERY  2003   TUBAL LIGATION      Allergies  Allergen Reactions   Amoxicillin Hives and Swelling   Penicillins Hives and Swelling    BP (!) 132/94    Ht 5\' 2"  (1.575 m)    Wt 170 lb (77.1 kg)    BMI 31.09 kg/m   [facility] 06/08/2021 06/22/2021  Frequency of aerobic exercise (# of days/week) 0 0  Average time in minutes 0 0  Frequency of strengthening activities (# of days/week) 0 0    No flowsheet data found.      Objective:  Physical Exam:  Gen: NAD, comfortable in exam room  Right wrist exam not repeated today.   Assessment & Plan:  1. Right wrist pain - MRI reviewed and discussed with patient.  Scapholunate ligament intact.  Cyst visualized measuring 35mm in largest dimension.  While this is part of her pain I stressed the pain up her arm, involving extensors of forearm is more consistent with overuse related to this initial pain.  No radicular symptoms.  Offered formal physical therapy vs aspiration of cyst.  She had similar issue with her left ankle in past and required surgery after trial of conservative measures so wants to discuss with surgeon first before going ahead with any of these options.

## 2021-06-25 ENCOUNTER — Ambulatory Visit: Payer: Self-pay | Admitting: Orthopedic Surgery

## 2021-06-26 LAB — ALLERGEN, LETTUCE, F215: Allergen Lettuce IgE: <0.1 kU/L

## 2021-06-26 LAB — ALLERGEN, WHEAT, F4: Wheat IgE: <0.1 kU/L

## 2021-06-26 LAB — ALLERGEN PROFILE, SHELLFISH
Clam IgE: <0.1 kU/L
F023-IgE Crab: <0.1 kU/L
F080-IgE Lobster: <0.1 kU/L
F290-IgE Oyster: <0.1 kU/L
Scallop IgE: <0.1 kU/L
Shrimp IgE: 0.26 kU/L — AB

## 2021-06-26 LAB — ALLERGEN PROFILE, FOOD-FISH
Allergen Mackerel IgE: <0.1 kU/L
Allergen Salmon IgE: <0.1 kU/L
Allergen Trout IgE: <0.1 kU/L
Allergen Walley Pike IgE: <0.1 kU/L
Codfish IgE: <0.1 kU/L
Halibut IgE: <0.1 kU/L
Tuna: <0.1 kU/L

## 2021-06-26 LAB — THYROID ANTIBODIES
Thyroglobulin Antibody: <1 IU/mL (ref 0.0–0.9)
Thyroperoxidase Ab SerPl-aCnc: 10 IU/mL (ref 0–34)

## 2021-06-26 LAB — ALLERGEN, BROCCOLI, F260: Allergen Broccoli: <0.1 kU/L

## 2021-06-26 LAB — ALPHA-GAL PANEL
Allergen Lamb IgE: <0.1 kU/L
Beef IgE: <0.1 kU/L
IgE (Immunoglobulin E), Serum: 59 IU/mL (ref 6–495)
O215-IgE Alpha-Gal: <0.1 kU/L
Pork IgE: <0.1 kU/L

## 2021-06-26 LAB — ALLERGEN AVOCADO F96: F096-IgE Avocado: <0.1 kU/L

## 2021-06-26 LAB — TRYPTASE: Tryptase: 4.1 ug/L (ref 2.2–13.2)

## 2021-06-26 LAB — CHRONIC URTICARIA: cu index: 2.4 (ref <10)

## 2021-06-26 LAB — ALLERGEN MILK: Milk IgE: <0.1 kU/L

## 2021-06-26 LAB — ALLERGEN, TOMATO F25: Allergen Tomato, IgE: <0.1 kU/L

## 2021-06-28 ENCOUNTER — Ambulatory Visit
Admission: RE | Admit: 2021-06-28 | Discharge: 2021-06-28 | Disposition: A | Discharge status: Home / Self Care | Payer: No Typology Code available for payment source | Source: Ambulatory Visit | Attending: Family Medicine | Admitting: Family Medicine

## 2021-06-28 ENCOUNTER — Other Ambulatory Visit: Payer: Self-pay

## 2021-06-28 DIAGNOSIS — D353 Benign neoplasm of craniopharyngeal duct: Secondary | ICD-10-CM

## 2021-06-28 DIAGNOSIS — D352 Benign neoplasm of pituitary gland: Secondary | ICD-10-CM

## 2021-06-28 MED ORDER — GADOBENATE DIMEGLUMINE 529 MG/ML IV SOLN
7.0000 mL | Freq: Once | INTRAVENOUS | Status: AC | PRN
  Administered 2021-06-28: 7 mL via INTRAVENOUS

## 2021-06-29 ENCOUNTER — Encounter: Payer: Self-pay | Admitting: Family Medicine

## 2021-06-30 ENCOUNTER — Ambulatory Visit: Payer: Self-pay

## 2021-06-30 ENCOUNTER — Ambulatory Visit (INDEPENDENT_AMBULATORY_CARE_PROVIDER_SITE_OTHER): Payer: Self-pay | Admitting: Orthopedic Surgery

## 2021-06-30 ENCOUNTER — Other Ambulatory Visit: Payer: Self-pay

## 2021-06-30 DIAGNOSIS — M67439 Ganglion, unspecified wrist: Secondary | ICD-10-CM

## 2021-06-30 DIAGNOSIS — M25531 Pain in right wrist: Secondary | ICD-10-CM

## 2021-06-30 NOTE — Progress Notes (Signed)
Office Visit Note   Patient: [name]           Date of Birth: [birth year]           MRN: [id number] Visit Date: 06/30/2021              Requested by: Dene Gentry, MD [address] [place],  [place] [address] PCP: Ezequiel Essex, MD   Assessment & Plan: Visit Diagnoses:  1. Right wrist pain     Plan: We discussed the diagnosis, prognosis, non-operative and operative treatment options for her ganglion cyst.  Her symptoms seem to be mostly related to this cyst with the area of worst pain around the central and dorsal wrist.  She has no pain over the ECU tendon w/ negative ECU synergy test.  She has mild pain along the FCU tendon.    After our discussion, the patient would like to proceed with ganglion cyst aspiration, wrist immobilization, and oral NSAIDs.  We reviewed the risks and benefits of conservative management.  The patient expressed understanding of the reasoning and strategy going forward.  All patient questions and concerns were addressed.    Follow-Up Instructions: No follow-ups on file.   Orders:  Orders Placed This Encounter  Procedures   XR Wrist Complete Right   No orders of the defined types were placed in this encounter.     Procedures: Hand/UE Inj: R wrist for dorsal carpal ganglion on 06/30/2021 4:51 PM Indications: therapeutic Details: 18 G needle, dorsal approach Aspirate: clear Outcome: tolerated well, no immediate complications Procedure, treatment alternatives, risks and benefits explained, specific risks discussed. Consent was given by the patient. Patient was prepped and draped in the usual sterile fashion.      Clinical Data: No additional findings.   Subjective: Chief Complaint  Patient presents with   Right Wrist - Pain    This is a 41 year old right-hand-dominant female who presents with right wrist pain for the last 5 or 6 months.  She notes that she has a knot wrist cyst at the dorsal central aspect of the wrist that is  been there for the same time period.  She notes that the cyst fluctuates in size and her pain is typically worse when the cyst is larger.  I do not order to cancel thanks she has pain at the central portion of the wrist.  She notes that sometimes her pain will radiate proximally into the distal forearm.    She has no pain at the ulnar aspect of the wrist along the ECU tendon.  The recent MRI was obtained which was suggestive of possible ECU versus FCU tendinitis with mild degenerative changes of the thumb CMC joint.  She has no pain at the base of her thumb.   Review of Systems   Objective: Vital Signs: There were no vitals taken for this visit.  Physical Exam Constitutional:      Appearance: Normal appearance.  Cardiovascular:     Rate and Rhythm: Normal rate.     Pulses: Normal pulses.  Pulmonary:     Effort: Pulmonary effort is normal.  Skin:    General: Skin is warm and dry.     Capillary Refill: Capillary refill takes less than 2 seconds.  Neurological:     Mental Status: She is alert.    Left Hand Exam   Tenderness  Left hand tenderness location: TTP at dorsal and central wrist around the mass.   Range of Motion  The patient  has normal left wrist ROM.  Muscle Strength  The patient has normal left wrist strength.  Other  Erythema: absent Sensation: normal Pulse: present  Comments:  Approx 1.5 x 1.5 cm firm, round, well circumscribed mass at dorsal and central wrist consistent with a ganglion cyst.  Negative ECU synergy test w/ no pain along ECU tendon.  No pain at lateral epicondyle.  No pain w/ middle finger extension with elbow extended.      Specialty Comments:  No specialty comments available.  Imaging: No results found.   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   Hypochondriasis 08/19/2014   Cystic thyroid nodule 07/03/2014   Prediabetes 04/02/2014   Seasonal allergies 10/24/2012   Multiple food allergies 10/24/2012   Obesity, unspecified 07/23/2012   Fatty liver disease, nonalcoholic 72/01/4708   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   Past Medical History:  Diagnosis Date   Allergy    Angio-edema    ANXIETY 12/31/2008   Qualifier: Diagnosis of  By: Nadara Eaton  MD, Mickel Baas     Breast mass in female 62/83/6629   Complication of anesthesia    Problem waking up once and a headache   Condyloma acuminatum 07/21/2006   Qualifier: History of  By: Oneal Grout MD, Manuela Schwartz     Depression    Diabetes type 2, controlled ([facility]) 04/02/2014   Eczema    Family history of anesthesia complication    Mother had problem waking up   Hx gestational diabetes    Laryngitis 02/15/2020   Menorrhagia 07/03/2014   Molluscum contagiosum infection 12/01/2018   Muscle strain of lower leg, right, initial encounter 03/05/2019   Spinal headache    Thyroid fullness 04/02/2014   Urticaria    Vaginal lesion 11/13/2020   Voice hoarseness 05/22/2020    Family History  Problem Relation Age of Onset   Hypertension Mother    Hyperlipidemia Mother    Cervical cancer Mother    Diabetes Father    Hypertension Father    Irregular heart beat Father    Breast cancer Paternal Aunt    Throat cancer Maternal Grandmother    Prostate cancer Maternal Grandfather    Diabetes Other    Allergic rhinitis Daughter    Allergic rhinitis Daughter    Allergic rhinitis Son        milk   Stomach cancer Neg Hx    Colon cancer Neg Hx    Esophageal cancer Neg Hx    Pancreatic cancer Neg Hx     Past Surgical History:  Procedure Laterality Date    CESAREAN SECTION     CHOLECYSTECTOMY N/A 09/06/2013   Procedure: LAPAROSCOPIC CHOLECYSTECTOMY;  Surgeon: Harl Bowie, MD;  Location: [facility];  Service: General;  Laterality: N/A;   NASAL SINUS SURGERY  2003   TUBAL LIGATION     Social History   Occupational History   Not on file  Tobacco Use   Smoking status: Never   Smokeless tobacco: Never  Vaping Use   Vaping Use: Never used  Substance and Sexual Activity   Alcohol use: Yes    Comment: occ.  Drug use: No   Sexual activity: Yes    Birth control/protection: Surgical

## 2021-07-06 ENCOUNTER — Telehealth: Payer: Self-pay

## 2021-07-06 ENCOUNTER — Encounter: Payer: Self-pay | Admitting: Gastroenterology

## 2021-07-06 NOTE — Telephone Encounter (Signed)
Thanks Customer service manager.  Agree with taking higher dose of MiraLAX.  If omeprazole is not helping then we had previously discussed pursuing an EGD versus a trigger point injection of her abdomen.  I suspect she more than likely has musculoskeletal pain.  If she wants to see me in the office to discuss trigger point injection you can book her in the office with me, can use hemorrhoid banding slot.  Thanks

## 2021-07-06 NOTE — Telephone Encounter (Signed)
Scheduled office visit with Dr. Havery Moros on 07/14/21

## 2021-07-06 NOTE — Telephone Encounter (Signed)
Called patient to get more information on her message. She states the Omperazole is not helping, and the Linzess helped at first, but is not helping anymore. I suggested she take a higher dose of the Miralax (2-3 doses a day) until she feels she has empted well, and then back off. And to hold the [place] while doing this. She agreed.

## 2021-07-07 ENCOUNTER — Telehealth: Payer: Self-pay | Admitting: Orthopedic Surgery

## 2021-07-07 NOTE — Telephone Encounter (Signed)
Patient called advised the cyst has come back on her wrist and she is still having some pain. Patient said she has not used her hand. Patient asked what does she need to do now? The number to contact patient is [phone]

## 2021-07-14 ENCOUNTER — Ambulatory Visit: Payer: Self-pay | Admitting: Gastroenterology

## 2021-07-14 NOTE — Telephone Encounter (Signed)
Called patient no answer LMOM.

## 2021-07-14 NOTE — Telephone Encounter (Signed)
Hey can you call and schedule this patient for Dr. Tempie Donning please. Any day or time that works best for her is fine to book.  Thank you!

## 2021-07-15 NOTE — Telephone Encounter (Signed)
Called patient no answer LMOM.

## 2021-07-16 ENCOUNTER — Telehealth: Payer: Self-pay | Admitting: Orthopedic Surgery

## 2021-07-16 NOTE — Telephone Encounter (Signed)
Pt called. She states that her hand was swollen and in a lot of pain then the swelling went down and now its swollen again starting to cause a lot of pain. Wondering if she can come in this afternoon to be seen?   CB [phone]   You don't have to have interpreter to talk to pt.

## 2021-07-23 ENCOUNTER — Ambulatory Visit (INDEPENDENT_AMBULATORY_CARE_PROVIDER_SITE_OTHER): Payer: Self-pay | Admitting: Orthopedic Surgery

## 2021-07-23 ENCOUNTER — Other Ambulatory Visit: Payer: Self-pay

## 2021-07-23 DIAGNOSIS — M67439 Ganglion, unspecified wrist: Secondary | ICD-10-CM

## 2021-07-23 NOTE — Progress Notes (Signed)
? ?Office Visit Note ?  ?Patient: [name]           ?Date of Birth: 1980/06/20           ?MRN: [id number] ?Visit Date: 07/23/2021 ?             ?Requested by: Ezequiel Essex, MD ?[address] ?[place],  [place] [address] ?PCP: Ezequiel Essex, MD ? ? ?Assessment & Plan: ?Visit Diagnoses:  ?1. Dorsal wrist ganglion   ? ? ?Plan: Patient's dorsal wrist ganglion has returned despite aspiration.  It is still symptomatic.  Patient is interested in pursuing surgical excision.  We discussed the risk of surgical excision including but not limited to infection, damage to superficial nerves, damage to extensor tendons, recurrence, need for additional surgery.  Patient wants to proceed.  She will be contacted regarding date and time of surgery. ? ?Follow-Up Instructions: No follow-ups on file.  ? ?Orders:  ?No orders of the defined types were placed in this encounter. ? ?No orders of the defined types were placed in this encounter. ? ? ? ? Procedures: ?No procedures performed ? ? ?Clinical Data: ?No additional findings. ? ? ?Subjective: ?Chief Complaint  ?Patient presents with  ? Right Wrist - Follow-up  ?  States that it is not better  ? ? ?This is a 41 year old right-hand-dominant female presents with continued pain at the dorsal aspect of her right wrist in the area of the ganglion cyst.  She was seen previously on 2/7 at which time the cyst was aspirated.  It has since returned.  She is still taking oral NSAIDs and wearing a brace which provides some relief.  She still describes pain and swelling around the cyst.  She is interested in discussing surgical excision. ? ? ?Review of Systems ? ? ?Objective: ?Vital Signs: There were no vitals taken for this visit. ? ?Physical Exam ?Constitutional:   ?   Appearance: Normal appearance.  ?Cardiovascular:  ?   Rate and Rhythm: Normal rate.  ?   Pulses: Normal pulses.  ?Pulmonary:  ?   Effort: Pulmonary effort is normal.  ?Skin: ?   General: Skin is warm and dry.  ?   Capillary  Refill: Capillary refill takes less than 2 seconds.  ?Neurological:  ?   Mental Status: She is alert.  ? ? ?Right Hand Exam  ? ?Tenderness  ?Right hand tenderness location: TTP at dorsal central wrist in area of cyst. ? ?Range of Motion  ?The patient has normal right wrist ROM.  ? ?Muscle Strength  ?The patient has normal right wrist strength. ? ?Other  ?Erythema: absent ?Sensation: normal ?Pulse: present ? ?Comments:  Firm, round, well circumscribed mass at dorsal and central aspect of wrist.  ? ? ? ? ?Specialty Comments:  ?No specialty comments available. ? ?Imaging: ?No results found. ? ? ?PMFS History: ?Patient Active Problem List  ? Diagnosis Date Noted  ? Allergic reaction 06/07/2021  ? Hyperlipidemia 05/20/2021  ? Dorsal wrist ganglion 05/20/2021  ? Healthcare maintenance 05/20/2021  ? Abdominal pain 03/29/2021  ? Vision changes 03/01/2021  ? Screening examination for STD (sexually transmitted disease) 02/15/2020  ? 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 Hypochondriasis 08/19/2014  ? Cystic thyroid nodule 07/03/2014  ? Prediabetes 04/02/2014  ? Seasonal allergies 10/24/2012  ? Multiple food allergies 10/24/2012  ? Obesity, unspecified 07/23/2012  ? Fatty liver disease, nonalcoholic 19/14/7829  ?  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 ?Past Medical History:  ?Diagnosis Date  ? Allergy   ? Angio-edema   ? ANXIETY 12/31/2008  ? Qualifier: Diagnosis of  By: Nadara Eaton  MD, Mickel Baas    ? Breast mass in female 10/06/2017  ? Complication of anesthesia   ? Problem waking up once and a headache  ? Condyloma acuminatum 07/21/2006  ? Qualifier: History of  By: Oneal Grout MD, Manuela Schwartz    ? Depression   ? Diabetes type 2, controlled ([place]) 04/02/2014  ? Eczema   ? Family history of anesthesia complication   ? Mother had problem waking up  ? Hx gestational diabetes   ? Laryngitis 02/15/2020  ? Menorrhagia 07/03/2014  ?  Molluscum contagiosum infection 12/01/2018  ? Muscle strain of lower leg, right, initial encounter 03/05/2019  ? Spinal headache   ? Thyroid fullness 04/02/2014  ? Urticaria   ? Vaginal lesion 11/13/2020  ? Voice hoarseness 05/22/2020  ?  ?Family History  ?Problem Relation Age of Onset  ? Hypertension Mother   ? Hyperlipidemia Mother   ? Cervical cancer Mother   ? Diabetes Father   ? Hypertension Father   ? Irregular heart beat Father   ? Breast cancer Paternal Aunt   ? Throat cancer Maternal Grandmother   ? Prostate cancer Maternal Grandfather   ? Diabetes Other   ? Allergic rhinitis Daughter   ? Allergic rhinitis Daughter   ? Allergic rhinitis Son   ?     milk  ? Stomach cancer Neg Hx   ? Colon cancer Neg Hx   ? Esophageal cancer Neg Hx   ? Pancreatic cancer Neg Hx   ?  ?Past Surgical History:  ?Procedure Laterality Date  ? CESAREAN SECTION    ? CHOLECYSTECTOMY N/A 09/06/2013  ? Procedure: LAPAROSCOPIC CHOLECYSTECTOMY;  Surgeon: Harl Bowie, MD;  Location: [facility];  Service: General;  Laterality: N/A;  ? NASAL SINUS SURGERY  2003  ? TUBAL LIGATION    ? ?Social History  ? ?Occupational History  ? Not on file  ?Tobacco Use  ? Smoking status: Never  ? Smokeless tobacco: Never  ?Vaping Use  ? Vaping Use: Never used  ?Substance and Sexual Activity  ? Alcohol use: Yes  ?  Comment: occ.  ? Drug use: No  ? Sexual activity: Yes  ?  Birth control/protection: Surgical  ? ? ? ? ? ? ?

## 2021-07-23 NOTE — H&P (View-Only) (Signed)
? ?Office Visit Note ?  ?Patient: [name]           ?Date of Birth: 30-Mar-1981           ?MRN: [id number] ?Visit Date: 07/23/2021 ?             ?Requested by: Ezequiel Essex, MD ?[address] ?[place],  [place] [address] ?PCP: Ezequiel Essex, MD ? ? ?Assessment & Plan: ?Visit Diagnoses:  ?1. Dorsal wrist ganglion   ? ? ?Plan: Patient's dorsal wrist ganglion has returned despite aspiration.  It is still symptomatic.  Patient is interested in pursuing surgical excision.  We discussed the risk of surgical excision including but not limited to infection, damage to superficial nerves, damage to extensor tendons, recurrence, need for additional surgery.  Patient wants to proceed.  She will be contacted regarding date and time of surgery. ? ?Follow-Up Instructions: No follow-ups on file.  ? ?Orders:  ?No orders of the defined types were placed in this encounter. ? ?No orders of the defined types were placed in this encounter. ? ? ? ? Procedures: ?No procedures performed ? ? ?Clinical Data: ?No additional findings. ? ? ?Subjective: ?Chief Complaint  ?Patient presents with  ? Right Wrist - Follow-up  ?  States that it is not better  ? ? ?This is a 41 year old right-hand-dominant female presents with continued pain at the dorsal aspect of her right wrist in the area of the ganglion cyst.  She was seen previously on 2/7 at which time the cyst was aspirated.  It has since returned.  She is still taking oral NSAIDs and wearing a brace which provides some relief.  She still describes pain and swelling around the cyst.  She is interested in discussing surgical excision. ? ? ?Review of Systems ? ? ?Objective: ?Vital Signs: There were no vitals taken for this visit. ? ?Physical Exam ?Constitutional:   ?   Appearance: Normal appearance.  ?Cardiovascular:  ?   Rate and Rhythm: Normal rate.  ?   Pulses: Normal pulses.  ?Pulmonary:  ?   Effort: Pulmonary effort is normal.  ?Skin: ?   General: Skin is warm and dry.  ?   Capillary  Refill: Capillary refill takes less than 2 seconds.  ?Neurological:  ?   Mental Status: She is alert.  ? ? ?Right Hand Exam  ? ?Tenderness  ?Right hand tenderness location: TTP at dorsal central wrist in area of cyst. ? ?Range of Motion  ?The patient has normal right wrist ROM.  ? ?Muscle Strength  ?The patient has normal right wrist strength. ? ?Other  ?Erythema: absent ?Sensation: normal ?Pulse: present ? ?Comments:  Firm, round, well circumscribed mass at dorsal and central aspect of wrist.  ? ? ? ? ?Specialty Comments:  ?No specialty comments available. ? ?Imaging: ?No results found. ? ? ?PMFS History: ?Patient Active Problem List  ? Diagnosis Date Noted  ? Allergic reaction 06/07/2021  ? Hyperlipidemia 05/20/2021  ? Dorsal wrist ganglion 05/20/2021  ? Healthcare maintenance 05/20/2021  ? Abdominal pain 03/29/2021  ? Vision changes 03/01/2021  ? Screening examination for STD (sexually transmitted disease) 02/15/2020  ? 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 Hypochondriasis 08/19/2014  ? Cystic thyroid nodule 07/03/2014  ? Prediabetes 04/02/2014  ? Seasonal allergies 10/24/2012  ? Multiple food allergies 10/24/2012  ? Obesity, unspecified 07/23/2012  ? Fatty liver disease, nonalcoholic 35/57/3220  ?  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 ?Past Medical History:  ?Diagnosis Date  ? Allergy   ? Angio-edema   ? ANXIETY 12/31/2008  ? Qualifier: Diagnosis of  By: Nadara Eaton  MD, Mickel Baas    ? Breast mass in female 10/06/2017  ? Complication of anesthesia   ? Problem waking up once and a headache  ? Condyloma acuminatum 07/21/2006  ? Qualifier: History of  By: Oneal Grout MD, Manuela Schwartz    ? Depression   ? Diabetes type 2, controlled ([place]) 04/02/2014  ? Eczema   ? Family history of anesthesia complication   ? Mother had problem waking up  ? Hx gestational diabetes   ? Laryngitis 02/15/2020  ? Menorrhagia 07/03/2014  ?  Molluscum contagiosum infection 12/01/2018  ? Muscle strain of lower leg, right, initial encounter 03/05/2019  ? Spinal headache   ? Thyroid fullness 04/02/2014  ? Urticaria   ? Vaginal lesion 11/13/2020  ? Voice hoarseness 05/22/2020  ?  ?Family History  ?Problem Relation Age of Onset  ? Hypertension Mother   ? Hyperlipidemia Mother   ? Cervical cancer Mother   ? Diabetes Father   ? Hypertension Father   ? Irregular heart beat Father   ? Breast cancer Paternal Aunt   ? Throat cancer Maternal Grandmother   ? Prostate cancer Maternal Grandfather   ? Diabetes Other   ? Allergic rhinitis Daughter   ? Allergic rhinitis Daughter   ? Allergic rhinitis Son   ?     milk  ? Stomach cancer Neg Hx   ? Colon cancer Neg Hx   ? Esophageal cancer Neg Hx   ? Pancreatic cancer Neg Hx   ?  ?Past Surgical History:  ?Procedure Laterality Date  ? CESAREAN SECTION    ? CHOLECYSTECTOMY N/A 09/06/2013  ? Procedure: LAPAROSCOPIC CHOLECYSTECTOMY;  Surgeon: Harl Bowie, MD;  Location: [facility];  Service: General;  Laterality: N/A;  ? NASAL SINUS SURGERY  2003  ? TUBAL LIGATION    ? ?Social History  ? ?Occupational History  ? Not on file  ?Tobacco Use  ? Smoking status: Never  ? Smokeless tobacco: Never  ?Vaping Use  ? Vaping Use: Never used  ?Substance and Sexual Activity  ? Alcohol use: Yes  ?  Comment: occ.  ? Drug use: No  ? Sexual activity: Yes  ?  Birth control/protection: Surgical  ? ? ? ? ? ? ?

## 2021-07-27 ENCOUNTER — Encounter (HOSPITAL_BASED_OUTPATIENT_CLINIC_OR_DEPARTMENT_OTHER): Payer: Self-pay | Admitting: Orthopedic Surgery

## 2021-07-27 ENCOUNTER — Encounter: Payer: Self-pay | Admitting: Gastroenterology

## 2021-07-27 ENCOUNTER — Other Ambulatory Visit: Payer: Self-pay

## 2021-07-28 ENCOUNTER — Encounter (HOSPITAL_BASED_OUTPATIENT_CLINIC_OR_DEPARTMENT_OTHER)
Admission: RE | Admit: 2021-07-28 | Discharge: 2021-07-28 | Disposition: A | Discharge status: Home / Self Care | Payer: Self-pay | Source: Ambulatory Visit | Attending: Orthopedic Surgery | Admitting: Orthopedic Surgery

## 2021-07-28 DIAGNOSIS — Z01818 Encounter for other preprocedural examination: Secondary | ICD-10-CM | POA: Insufficient documentation

## 2021-07-28 DIAGNOSIS — R7303 Prediabetes: Secondary | ICD-10-CM | POA: Insufficient documentation

## 2021-07-28 LAB — BASIC METABOLIC PANEL
Anion gap: 6 (ref 5–15)
BUN: 12 mg/dL (ref 6–20)
CO2: 25 mmol/L (ref 22–32)
Calcium: 9.1 mg/dL (ref 8.9–10.3)
Chloride: 105 mmol/L (ref 98–111)
Creatinine, Ser: 0.64 mg/dL (ref 0.44–1.00)
GFR, Estimated: >60 mL/min (ref >60)
Glucose, Bld: 139 mg/dL — ABNORMAL HIGH (ref 70–99)
Potassium: 4.4 mmol/L (ref 3.5–5.1)
Sodium: 136 mmol/L (ref 135–145)

## 2021-07-28 NOTE — Progress Notes (Signed)

## 2021-07-29 ENCOUNTER — Ambulatory Visit: Payer: Self-pay | Admitting: Physician Assistant

## 2021-07-31 ENCOUNTER — Encounter: Payer: Self-pay | Admitting: Allergy

## 2021-08-03 ENCOUNTER — Ambulatory Visit (HOSPITAL_BASED_OUTPATIENT_CLINIC_OR_DEPARTMENT_OTHER)
Admission: RE | Admit: 2021-08-03 | Discharge: 2021-08-03 | Disposition: A | Discharge status: Home / Self Care | Payer: Self-pay | Attending: Orthopedic Surgery | Admitting: Orthopedic Surgery

## 2021-08-03 ENCOUNTER — Encounter (HOSPITAL_BASED_OUTPATIENT_CLINIC_OR_DEPARTMENT_OTHER): Payer: Self-pay | Admitting: Orthopedic Surgery

## 2021-08-03 ENCOUNTER — Other Ambulatory Visit: Payer: Self-pay

## 2021-08-03 ENCOUNTER — Ambulatory Visit (HOSPITAL_BASED_OUTPATIENT_CLINIC_OR_DEPARTMENT_OTHER): Payer: Self-pay | Admitting: Anesthesiology

## 2021-08-03 ENCOUNTER — Encounter (HOSPITAL_BASED_OUTPATIENT_CLINIC_OR_DEPARTMENT_OTHER)
Admission: RE | Disposition: A | Discharge status: Home / Self Care | Payer: Self-pay | Source: Home / Self Care | Attending: Orthopedic Surgery

## 2021-08-03 DIAGNOSIS — F32A Depression, unspecified: Secondary | ICD-10-CM | POA: Insufficient documentation

## 2021-08-03 DIAGNOSIS — M1990 Unspecified osteoarthritis, unspecified site: Secondary | ICD-10-CM | POA: Insufficient documentation

## 2021-08-03 DIAGNOSIS — F418 Other specified anxiety disorders: Secondary | ICD-10-CM

## 2021-08-03 DIAGNOSIS — M67431 Ganglion, right wrist: Secondary | ICD-10-CM

## 2021-08-03 DIAGNOSIS — R7303 Prediabetes: Secondary | ICD-10-CM

## 2021-08-03 DIAGNOSIS — E119 Type 2 diabetes mellitus without complications: Secondary | ICD-10-CM | POA: Insufficient documentation

## 2021-08-03 DIAGNOSIS — F419 Anxiety disorder, unspecified: Secondary | ICD-10-CM | POA: Insufficient documentation

## 2021-08-03 HISTORY — DX: Prediabetes: R73.03

## 2021-08-03 HISTORY — PX: GANGLION CYST EXCISION: SHX1691

## 2021-08-03 LAB — POCT PREGNANCY, URINE: Preg Test, Ur: NEGATIVE

## 2021-08-03 LAB — GLUCOSE, CAPILLARY
Glucose-Capillary: 100 mg/dL — ABNORMAL HIGH (ref 70–99)
Glucose-Capillary: 104 mg/dL — ABNORMAL HIGH (ref 70–99)

## 2021-08-03 SURGERY — EXCISION, GANGLION CYST, WRIST
Anesthesia: Monitor Anesthesia Care | Site: Wrist | Laterality: Right

## 2021-08-03 MED ORDER — MIDAZOLAM HCL 2 MG/2ML IJ SOLN
INTRAMUSCULAR | Status: AC
  Filled 2021-08-03: qty 2

## 2021-08-03 MED ORDER — FENTANYL CITRATE (PF) 100 MCG/2ML IJ SOLN
25.0000 ug | INTRAMUSCULAR | Status: DC | PRN
  Administered 2021-08-03 (×3): 50 ug via INTRAVENOUS

## 2021-08-03 MED ORDER — FENTANYL CITRATE (PF) 100 MCG/2ML IJ SOLN
INTRAMUSCULAR | Status: AC
  Filled 2021-08-03: qty 2

## 2021-08-03 MED ORDER — ONDANSETRON HCL 4 MG/2ML IJ SOLN
INTRAMUSCULAR | Status: AC
  Filled 2021-08-03: qty 2

## 2021-08-03 MED ORDER — PROPOFOL 10 MG/ML IV BOLUS
INTRAVENOUS | Status: DC | PRN
  Administered 2021-08-03 (×2): 30 mg via INTRAVENOUS
  Administered 2021-08-03: 20 mg via INTRAVENOUS

## 2021-08-03 MED ORDER — BUPIVACAINE HCL (PF) 0.25 % IJ SOLN
INTRAMUSCULAR | Status: DC | PRN
  Administered 2021-08-03: 10 mL

## 2021-08-03 MED ORDER — 0.9 % SODIUM CHLORIDE (POUR BTL) OPTIME
TOPICAL | Status: DC | PRN
  Administered 2021-08-03: 60 mL

## 2021-08-03 MED ORDER — PROPOFOL 500 MG/50ML IV EMUL
INTRAVENOUS | Status: DC | PRN
  Administered 2021-08-03: 75 ug/kg/min via INTRAVENOUS

## 2021-08-03 MED ORDER — LACTATED RINGERS IV SOLN: INTRAVENOUS | Status: DC

## 2021-08-03 MED ORDER — CELECOXIB 200 MG PO CAPS
ORAL_CAPSULE | ORAL | Status: AC
  Filled 2021-08-03: qty 1

## 2021-08-03 MED ORDER — ONDANSETRON HCL 4 MG/2ML IJ SOLN
INTRAMUSCULAR | Status: AC
  Filled 2021-08-03: qty 4

## 2021-08-03 MED ORDER — OXYCODONE HCL 5 MG PO TABS: 5.0000 mg | ORAL_TABLET | Freq: Four times a day (QID) | ORAL | 0 refills | Status: AC | PRN

## 2021-08-03 MED ORDER — ACETAMINOPHEN 500 MG PO TABS
1000.0000 mg | ORAL_TABLET | Freq: Once | ORAL | Status: AC
  Administered 2021-08-03: 1000 mg via ORAL

## 2021-08-03 MED ORDER — CELECOXIB 200 MG PO CAPS
200.0000 mg | ORAL_CAPSULE | Freq: Once | ORAL | Status: AC
  Administered 2021-08-03: 200 mg via ORAL

## 2021-08-03 MED ORDER — DEXMEDETOMIDINE (PRECEDEX) IN NS 20 MCG/5ML (4 MCG/ML) IV SYRINGE
PREFILLED_SYRINGE | INTRAVENOUS | Status: AC
  Filled 2021-08-03: qty 20

## 2021-08-03 MED ORDER — MIDAZOLAM HCL 2 MG/2ML IJ SOLN
INTRAMUSCULAR | Status: DC | PRN
  Administered 2021-08-03: 2 mg via INTRAVENOUS

## 2021-08-03 MED ORDER — ACETAMINOPHEN 500 MG PO TABS
ORAL_TABLET | ORAL | Status: AC
  Filled 2021-08-03: qty 2

## 2021-08-03 MED ORDER — ONDANSETRON HCL 4 MG/2ML IJ SOLN
INTRAMUSCULAR | Status: DC | PRN
  Administered 2021-08-03: 4 mg via INTRAVENOUS

## 2021-08-03 MED ORDER — FENTANYL CITRATE (PF) 100 MCG/2ML IJ SOLN
INTRAMUSCULAR | Status: DC | PRN
  Administered 2021-08-03: 25 ug via INTRAVENOUS
  Administered 2021-08-03 (×2): 50 ug via INTRAVENOUS

## 2021-08-03 SURGICAL SUPPLY — 31 items
APL PRP STRL LF DISP 70% ISPRP (MISCELLANEOUS) ×1
BLADE SURG 15 STRL LF DISP TIS (BLADE) ×1 IMPLANT
BLADE SURG 15 STRL SS (BLADE) ×2
BNDG CMPR 9X4 STRL LF SNTH (GAUZE/BANDAGES/DRESSINGS) ×1
BNDG ELASTIC 3X5.8 VLCR STR LF (GAUZE/BANDAGES/DRESSINGS) ×2 IMPLANT
BNDG ESMARK 4X9 LF (GAUZE/BANDAGES/DRESSINGS) ×2 IMPLANT
BNDG GAUZE ELAST 4 BULKY (GAUZE/BANDAGES/DRESSINGS) ×2 IMPLANT
CHLORAPREP W/TINT 26 (MISCELLANEOUS) ×2 IMPLANT
CORD BIPOLAR FORCEPS 12FT (ELECTRODE) ×2 IMPLANT
COVER BACK TABLE 60X90IN (DRAPES) ×2 IMPLANT
COVER MAYO STAND STRL (DRAPES) ×2 IMPLANT
DRAPE EXTREMITY T 121X128X90 (DISPOSABLE) ×2 IMPLANT
DRAPE U-SHAPE 47X51 STRL (DRAPES) ×1 IMPLANT
GAUZE XEROFORM 1X8 LF (GAUZE/BANDAGES/DRESSINGS) ×2 IMPLANT
GLOVE SURG ENC MOIS LTX SZ7 (GLOVE) ×2 IMPLANT
GLOVE SURG UNDER POLY LF SZ7 (GLOVE) ×2 IMPLANT
GOWN STRL REUS W/ TWL LRG LVL3 (GOWN DISPOSABLE) ×1 IMPLANT
GOWN STRL REUS W/TWL LRG LVL3 (GOWN DISPOSABLE) ×2
GOWN STRL REUS W/TWL XL LVL3 (GOWN DISPOSABLE) ×2 IMPLANT
NDL HYPO 25X1 1.5 SAFETY (NEEDLE) IMPLANT
NEEDLE HYPO 25X1 1.5 SAFETY (NEEDLE) ×2 IMPLANT
NS IRRIG 1000ML POUR BTL (IV SOLUTION) ×2 IMPLANT
PACK BASIN DAY SURGERY FS (CUSTOM PROCEDURE TRAY) ×2 IMPLANT
PADDING CAST ABS 3INX4YD NS (CAST SUPPLIES) ×1
PADDING CAST ABS COTTON 3X4 (CAST SUPPLIES) IMPLANT
SUT ETHILON 4 0 PS 2 18 (SUTURE) ×2 IMPLANT
SUT MNCRL AB 3-0 PS2 18 (SUTURE) ×2 IMPLANT
SYR BULB EAR ULCER 3OZ GRN STR (SYRINGE) ×2 IMPLANT
SYR CONTROL 10ML LL (SYRINGE) ×1 IMPLANT
TOWEL GREEN STERILE FF (TOWEL DISPOSABLE) ×4 IMPLANT
UNDERPAD 30X36 HEAVY ABSORB (UNDERPADS AND DIAPERS) ×2 IMPLANT

## 2021-08-03 NOTE — Anesthesia Preprocedure Evaluation (Addendum)
Anesthesia Evaluation  ?Patient identified by MRN, date of birth, ID band ?Patient awake ? ? ? ?Reviewed: ?Allergy & Precautions, H&P , NPO status , Patient's Chart, lab work & pertinent test results ? ?Airway ?Mallampati: III ? ?TM Distance: >3 FB ?Neck ROM: Full ? ? ? Dental ?no notable dental hx. ?(+) Teeth Intact, Dental Advisory Given ?  ?Pulmonary ?neg pulmonary ROS,  ?  ?Pulmonary exam normal ?breath sounds clear to auscultation ? ? ? ? ? ? Cardiovascular ?negative cardio ROS ? ? ?Rhythm:Regular Rate:Normal ? ? ?  ?Neuro/Psych ? Headaches, Anxiety Depression   ? GI/Hepatic ?negative GI ROS, Neg liver ROS,   ?Endo/Other  ?negative endocrine ROS ? Renal/GU ?negative Renal ROS  ?negative genitourinary ?  ?Musculoskeletal ? ?(+) Arthritis , Osteoarthritis,   ? Abdominal ?  ?Peds ? Hematology ?negative hematology ROS ?(+)   ?Anesthesia Other Findings ? ? Reproductive/Obstetrics ?negative OB ROS ? ?  ? ? ? ? ? ? ? ? ? ? ? ? ? ?  ?  ? ? ? ? ? ? ? ?Anesthesia Physical ?Anesthesia Plan ? ?ASA: 2 ? ?Anesthesia Plan: MAC  ? ?Post-op Pain Management: Tylenol PO (pre-op)* and Celebrex PO (pre-op)*  ? ?Induction: Intravenous ? ?PONV Risk Score and Plan: 3 and Propofol infusion, Midazolam and Ondansetron ? ?Airway Management Planned: Natural Airway and Simple Face Mask ? ?Additional Equipment:  ? ?Intra-op Plan:  ? ?Post-operative Plan:  ? ?Informed Consent: I have reviewed the patients History and Physical, chart, labs and discussed the procedure including the risks, benefits and alternatives for the proposed anesthesia with the patient or authorized representative who has indicated his/her understanding and acceptance.  ? ? ? ?Dental advisory given ? ?Plan Discussed with: CRNA ? ?Anesthesia Plan Comments:   ? ? ? ? ? ?Anesthesia Quick Evaluation ? ?

## 2021-08-03 NOTE — Transfer of Care (Signed)
Immediate Anesthesia Transfer of Care Note ? ?Patient: [name] ? ?Procedure(s) Performed: RIGHT REMOVAL GANGLION OF WRIST (Right: Wrist) ? ?Patient Location: PACU ? ?Anesthesia Type:MAC ? ?Level of Consciousness: awake, alert  and oriented ? ?Airway & Oxygen Therapy: Patient Spontanous Breathing and Patient connected to face mask oxygen ? ?Post-op Assessment: Report given to RN and Post -op Vital signs reviewed and stable ? ?Post vital signs: Reviewed and stable ? ?Last Vitals:  ?Vitals Value Taken Time  ?BP 133/83 08/03/21 1348  ?Temp    ?Pulse 79 08/03/21 1350  ?Resp 18 08/03/21 1350  ?SpO2 98 % 08/03/21 1350  ?Vitals shown include unvalidated device data. ? ?Last Pain:  ?Vitals:  ? 08/03/21 1141  ?TempSrc: Oral  ?PainSc: 0-No pain  ?   ? ?  ? ?Complications: No notable events documented. ?

## 2021-08-03 NOTE — Anesthesia Postprocedure Evaluation (Signed)
Anesthesia Post Note ? ?Patient: [name] ? ?Procedure(s) Performed: RIGHT REMOVAL GANGLION OF WRIST (Right: Wrist) ? ?  ? ?Patient location during evaluation: PACU ?Anesthesia Type: MAC ?Level of consciousness: awake and alert ?Pain management: pain level controlled ?Vital Signs Assessment: post-procedure vital signs reviewed and stable ?Respiratory status: spontaneous breathing, nonlabored ventilation and respiratory function stable ?Cardiovascular status: stable and blood pressure returned to baseline ?Postop Assessment: no apparent nausea or vomiting ?Anesthetic complications: no ? ? ?No notable events documented. ? ?Last Vitals:  ?Vitals:  ? 08/03/21 1428 08/03/21 1430  ?BP:  (!) 146/83  ?Pulse: 72 65  ?Resp: 18 13  ?Temp:    ?SpO2: 97% 99%  ?  ?Last Pain:  ?Vitals:  ? 08/03/21 1430  ?TempSrc:   ?PainSc: 2   ? ? ?  ?  ?  ?  ?  ?  ? ?[name],W. EDMOND ? ? ? ? ?

## 2021-08-03 NOTE — Op Note (Signed)
? ?  Date of Surgery: 08/03/2021 ? ?INDICATIONS: [name] is a 41 y.o.-year-old female with a right dorsal ganglion cyst that has failed aspiration and remains symptomatic.  Risks, benefits, and alternatives to surgery were again discussed with the patient wishing to proceed with surgery.  Informed consent was signed after our discussion.  ? ?PREOPERATIVE DIAGNOSIS:  ?Right dorsal ganglion cyst ? ?POSTOPERATIVE DIAGNOSIS: Same. ? ?PROCEDURE:  ?Right dorsal ganglion cyst excision ? ? ?SURGEON: Audria Nine, M.D. ? ?ASSIST:  ? ?ANESTHESIA:  Local, MAC ? ?IV FLUIDS AND URINE: See anesthesia. ? ?ESTIMATED BLOOD LOSS: 2 mL. ? ?IMPLANTS: * No implants in log *  ? ?DRAINS: None ? ?COMPLICATIONS: None ? ?SPECIMENS: Right dorsal wrist for pathology ? ?DESCRIPTION OF PROCEDURE: The patient was met in the preoperative holding area where the surgical site was marked and the consent form was verified.  The patient was then taken to the operating room and transferred to the operating table.  All bony prominences were well padded.  A tourniquet was applied to the right upper arm.  Monitor sedation was induced.  A formal time-out was performed to confirm that this was the correct patient, surgery, side, and site. A local block was performed using 0.25% plain bupivicaine. The operative extremity was prepped and draped in the usual and sterile fashion.   ? ?Following a second formal timeout, the limb was exsanguinated with an Esmarch and the tourniquet inflated 250 mmHg.  A transverse incision was made directly over the mass.  The skin and subcutaneous tissue was divided.  Small crossing vessels were coagulated.  Blunt dissection was used to identify the extensor retinaculum.  This was incised.  The extensor tendons of the fourth dorsal compartment were retracted ulnarly.  The ganglion cyst was identified.  It was approximately 2.5 to 3 cm across.  Blunt dissection was used to completely free of the mass circumferentially.  The  stalk of the cyst was identified and traced to the dorsal aspect of the SL interval.  A portion of the ganglion cyst was sharply dissected tangentially off of the SL ligament.  The cyst and a portion of the capsule was sharply excised.  The cyst was passed off the back table to be sent for pathology.  The SL ligament was inspected and found to be intact.  No attempt was made to close the capsule.  The wound was then thoroughly irrigated with copious sterile saline.  The tourniquet was deflated.  It was then closed in layered fashion using 3-0 Monocryl followed by 4-0 nylon sutures in a horizontal mattress fashion.  The wound was then dressed with Xeroform, folded Kerlix, bulky cast padding, and an Ace wrap. ? ?The patient was reversed from sedation and transferred the postoperative bed.  All counts were correct and within the procedure.  The patient was then taken the PACU in stable condition. ? ? ?POSTOPERATIVE PLAN: She will be discharged to home with appropriate pain medications and discharge instructions.  I will see her back in 10-14 days for her first postop visit.  ? ?Audria Nine, MD ?1:51 PM  ?

## 2021-08-03 NOTE — Discharge Instructions (Addendum)
? ?[name], M.D. ?Hand Surgery ? ?POST-OPERATIVE DISCHARGE INSTRUCTIONS ? ? ?PRESCRIPTIONS: ?- You have been given a prescription to be taken as directed for post-operative pain control.  You may also take over the counter ibuprofen/aleve and tylenol for pain. Take this as directed on the packaging. Do not exceed 3000 mg tylenol/acetaminophen in 24 hours. ? ?Ibuprofen 600-800 mg (3-4) tablets by mouth every 6 hours as needed for pain.  ? ?OR ? ?Aleve 2 tablets by mouth every 12 hours (twice daily) as needed for pain. ?  ?AND/OR ? ?Tylenol 1000 mg (2 tablets) every 8 hours as needed for pain. ? ?- Please use your pain medication carefully, as refills are limited and you may not be provided with one.  As stated above, please use over the counter pain medicine - it will also be helpful with decreasing your swelling.  ? ? ?ANESTHESIA: ?-After your surgery, post-surgical discomfort or pain is likely. This discomfort can last several days to a few weeks. At certain times of the day your discomfort may be more intense.  ? ?Did you receive a nerve block?  ? ?- A nerve block can provide pain relief for one hour to two days after your surgery. As long as the nerve block is working, you will experience little or no sensation in the area the surgeon operated on.  ?- As the nerve block wears off, you will begin to experience pain or discomfort. It is very important that you begin taking your prescribed pain medication before the nerve block fully wears off. Treating your pain at the first sign of the block wearing off will ensure your pain is better controlled and more tolerable when full-sensation returns. Do not wait until the pain is intolerable, as the medicine will be less effective. It is better to treat pain in advance than to try and catch up.  ? ?General Anesthesia:  ?If you did not receive a nerve block during your surgery, you will need to start taking your pain medication shortly after your surgery and  should continue to do so as prescribed by your surgeon.   ? ? ?ICE AND ELEVATION: ?- You may use ice for the first 48-72 hours, but it is not critical.   ?- Motion of your fingers is very important to decrease the swelling.  ?- Elevation, as much as possible for the next 48 hours, is critical for decreasing swelling as well as for pain relief. Elevation means when you are seated or lying down, you hand should be at or above your heart. When walking, the hand needs to be at or above the level of your elbow.  ?- If the bandage gets too tight, it may need to be loosened. Please contact our office and we will instruct you in how to do this.  ? ? ?SURGICAL BANDAGES:  ?- Keep your dressing and/or splint clean and dry at all times.  You can remove your dressing 5 days from now and change with a dry dressing or Band-Aids as needed thereafter. ?- You may place a plastic bag over your bandage to shower, but be careful, do not get your bandages wet.  ?- After the bandages have been removed, it is OK to get the stitches wet in a shower or with hand washing. Do Not soak or submerge the wound yet. Please do not use lotions or creams on the stitches.   ?  ? ?HAND THERAPY:  ?- You may not need any. If you do,   we will begin this at your follow up visit in the clinic.  ? ? ?ACTIVITY AND WORK: ?- You are encouraged to move any fingers which are not in the bandage.  ?- Light use of the fingers is allowed to assist the other hand with daily hygiene and eating, but strong gripping or lifting is often uncomfortable and should be avoided.  ?- You might miss a variable period of time from work and hopefully this issue has been discussed prior to surgery. You may not do any heavy work with your affected hand for about 2 weeks.  ? ? ?[facility] ?[address] ?[place],  [place]  [address] ?[phone]  ? ? ?Post Anesthesia Home Care Instructions ? ?Activity: ?Get plenty of rest for the remainder of the day. A  responsible individual must stay with you for 24 hours following the procedure.  ?For the next 24 hours, DO NOT: ?-Drive a car ?-Paediatric nurse ?-Drink alcoholic beverages ?-Take any medication unless instructed by your physician ?-Make any legal decisions or sign important papers. ? ?Meals: ?Start with liquid foods such as gelatin or soup. Progress to regular foods as tolerated. Avoid greasy, spicy, heavy foods. If nausea and/or vomiting occur, drink only clear liquids until the nausea and/or vomiting subsides. Call your physician if vomiting continues. ? ?Special Instructions/Symptoms: ?Your throat may feel dry or sore from the anesthesia or the breathing tube placed in your throat during surgery. If this causes discomfort, gargle with warm salt water. The discomfort should disappear within 24 hours. ? ?If you had a scopolamine patch placed behind your ear for the management of post- operative nausea and/or vomiting: ? ?1. The medication in the patch is effective for 72 hours, after which it should be removed.  Wrap patch in a tissue and discard in the trash. Wash hands thoroughly with soap and water. ?2. You may remove the patch earlier than 72 hours if you experience unpleasant side effects which may include dry mouth, dizziness or visual disturbances. ?3. Avoid touching the patch. Wash your hands with soap and water after contact with the patch. ? ? ?No tylenol until after 6pm today if needed. ?    ?

## 2021-08-03 NOTE — Interval H&P Note (Signed)
History and Physical Interval Note: ? ?08/03/2021 ?11:48 AM ? ?[name]  has presented today for surgery, with the diagnosis of RIGHT DORSAL GANGLION CYST.  The various methods of treatment have been discussed with the patient and family. After consideration of risks, benefits and other options for treatment, the patient has consented to  Procedure(s): ?RIGHT REMOVAL GANGLION OF WRIST (Right) as a surgical intervention.  The patient's history has been reviewed, patient examined, no change in status, stable for surgery.  I have reviewed the patient's chart and labs.  Questions were answered to the patient's satisfaction.   ? ? ?[name] ? ? ?

## 2021-08-03 NOTE — Brief Op Note (Signed)
08/03/2021 ? ?1:51 PM ? ?PATIENT:  [name]  41 y.o. female ? ?PRE-OPERATIVE DIAGNOSIS:  RIGHT DORSAL GANGLION CYST ? ?POST-OPERATIVE DIAGNOSIS:  RIGHT DORSAL GANGLION CYST ? ?PROCEDURE:  Procedure(s) with comments: ?RIGHT REMOVAL GANGLION OF WRIST (Right) - regional with monitored anesthesia care ? ?SURGEON:  Surgeon(s) and Role: ?   * Sherilyn Cooter, MD - Primary ? ?PHYSICIAN ASSISTANT:  ? ?ASSISTANTS: none  ? ?ANESTHESIA:   local and MAC ? ?EBL:  2 mL  ? ?BLOOD ADMINISTERED:none ? ?DRAINS: none  ? ?LOCAL MEDICATIONS USED:  BUPIVICAINE  ? ?SPECIMEN:  Source of Specimen:  Right dorsal wrist ? ?DISPOSITION OF SPECIMEN:  PATHOLOGY ? ?COUNTS:  YES ? ?TOURNIQUET:   ?Total Tourniquet Time Documented: ?Upper Arm (Right) - 31 minutes ?Total: Upper Arm (Right) - 31 minutes ? ? ?DICTATION: .Dragon Dictation ? ?PLAN OF CARE: Discharge to home after PACU ? ?PATIENT DISPOSITION:  PACU - hemodynamically stable. ?  ?Delay start of Pharmacological VTE agent (>24hrs) due to surgical blood loss or risk of bleeding: not applicable ? ?

## 2021-08-04 ENCOUNTER — Encounter (HOSPITAL_BASED_OUTPATIENT_CLINIC_OR_DEPARTMENT_OTHER): Payer: Self-pay | Admitting: Orthopedic Surgery

## 2021-08-04 LAB — SURGICAL PATHOLOGY

## 2021-08-06 ENCOUNTER — Ambulatory Visit: Payer: Self-pay | Admitting: Gastroenterology

## 2021-08-06 ENCOUNTER — Encounter: Payer: Self-pay | Admitting: Gastroenterology

## 2021-08-06 ENCOUNTER — Ambulatory Visit (INDEPENDENT_AMBULATORY_CARE_PROVIDER_SITE_OTHER): Payer: Self-pay | Admitting: Gastroenterology

## 2021-08-06 VITALS — BP 136/82 | HR 88 | Ht 62.0 in | Wt 175.6 lb

## 2021-08-06 DIAGNOSIS — K5900 Constipation, unspecified: Secondary | ICD-10-CM

## 2021-08-06 DIAGNOSIS — R140 Abdominal distension (gaseous): Secondary | ICD-10-CM

## 2021-08-06 DIAGNOSIS — R1011 Right upper quadrant pain: Secondary | ICD-10-CM

## 2021-08-06 DIAGNOSIS — K219 Gastro-esophageal reflux disease without esophagitis: Secondary | ICD-10-CM

## 2021-08-06 MED ORDER — LINACLOTIDE 145 MCG PO CAPS
145.0000 ug | ORAL_CAPSULE | Freq: Every day | ORAL | 3 refills | Status: DC
Start: 2021-08-06 — End: 2022-04-20

## 2021-08-06 MED ORDER — IBGARD 90 MG PO CPCR: ORAL_CAPSULE | ORAL | Status: DC

## 2021-08-06 NOTE — Patient Instructions (Signed)
If you are age 41 or older, your body mass index should be between 23-30. Your Body mass index is 32.12 kg/m?Marland Kitchen If this is out of the aforementioned range listed, please consider follow up with your Primary Care Provider. ? ?If you are age 42 or younger, your body mass index should be between 19-25. Your Body mass index is 32.12 kg/m?Marland Kitchen If this is out of the aformentioned range listed, please consider follow up with your Primary Care Provider.  ? ?________________________________________________________ ? ?The [facility] providers would like to encourage you to use [facility] to communicate with providers for non-urgent requests or questions.  Due to long hold times on the telephone, sending your provider a message by [facility] may be a faster and more efficient way to get a response.  Please allow 48 business hours for a response.  Please remember that this is for non-urgent requests.  ?_______________________________________________________ ? ?You have been scheduled for an endoscopy. Please follow written instructions given to you at your visit today. ?If you use inhalers (even only as needed), please bring them with you on the day of your procedure. ? ?We have sent the following medications to your pharmacy for you to pick up at your convenience: ?Linzess 145 mcg: Take once daily before breakfast ? ?We are giving you a Low-FODMAP diet handout today. FODMAPs are short-chain carbohydrates (sugars) that are highly fermentable, which means that they go through chemical changes in the GI system, and are poorly absorbed during digestion. When FODMAPs reach the colon (large intestine), bacteria ferment these sugars, turning them into gas and chemicals. This stretches the walls of the colon, causing abdominal bloating, distension, cramping, pain, and/or changes in bowel habits in many patients with IBS. FODMAPs are not unhealthy or harmful, but may exacerbate GI symptoms in those with sensitive GI tracts. ? ?Please purchase  the following medications over the counter and take as directed: ?IBGard: Take as directed as needed. ? ?Please go to the lab in the basement of our building to have lab work done as you leave today. Hit "B" for basement when you get on the elevator.  When the doors open the lab is on your left.  We will call you with the results. Thank you. ? ?Thank you for entrusting me with your care and for choosing [facility], ?Dr. Igiugig Cellar ? ? ?

## 2021-08-06 NOTE — Progress Notes (Signed)
? ?HPI :  ?41 year old female here for follow-up visit for right upper quadrant pain, GERD, bloating, constipation. ? ?See intake history in January 2023 note for details.  She was seen at that time for history of abdominal pain, altered bowel habits, history of cholecystectomy. ? ?At the last visit we started omeprazole 40 mg daily.  We checked an H. pylori IgG serology and it was negative.  We treated constipation with Linzess, and considered higher dose of MiraLAX.  I counseled her on fatty liver and we decided to trend LFTs and work on weight loss. ? ?She states she has been on Linzess 145 mcg/day.  She use some samples which ran out, she has been using her husband's dosing.  She endorses recall that she has had longstanding constipation.  MiraLAX has not worked well enough for her.  No blood in her stools.  Linzess has provided some relief for her but she needs to take it every day to keep her moving her bowels.  She feels that she has less bloating and gas when she takes it.  This is reliably relieved with a bowel movement.  She would prefer to use the Linzess and the MiraLAX. ? ?In regards to the omeprazole 40 mg daily, this is certainly helped with her acid reflux symptoms which she did not realize she had as much of until she took it.  She is happy with how that is making her feel.  However this has not really helped her abdominal pain.  She continues to have right upper quadrant and epigastric abdominal pain that can fluctuate.  She does feel that it is worse when she eats, feels it "inside" of her.  She does have worsening when she sleeps with her right side down so tends to sleep on her left side.  She does have some tenderness there.  Again she feels that they are most of the time but can have fluctuations in severity.  She has not been using any NSAIDs.  She inquires about an upper endoscopy since her symptoms have persisted.  We have previously discussed potentially doing a trigger point injection  for abdominal wall pain, however the distribution of where she is feeling this appears a bit larger than the last time I saw her, most of the right upper quadrant area to epigastric area. ? ?No family history of colon cancer or stomach cancer. ?  ?She had a CT scan of her abdomen pelvis in December which showed some hepatomegaly with hepatic steatosis, stable to a prior exam noted in 2010.  There was no clear cause for her symptoms on the CT scan.  Her colon appeared to be containing large burden of stool. ?LFTs in the past have been okay/normal.  She does not drink alcohol on a routine basis ?  ?She recently had a ganglion cyst removed. ? ?CT abdomen / pelvis without contrast 04/03/21: ?IMPRESSION: ?1. Hepatomegaly with suspected mild hepatic steatosis, similar to ?remote examination performed in 2010. Otherwise, no explanation for ?patient's palpable right upper abdominal mass. ?2. Post cholecystectomy. ?3. Large colonic stool burden without evidence of enteric ?obstruction. ?  ?  ?LFTs - AST 24, AST 19 - 03/25/21 ?   ? ?Past Medical History:  ?Diagnosis Date  ? Allergy   ? Angio-edema   ? ANXIETY 12/31/2008  ? Qualifier: Diagnosis of  By: Nadara Eaton  MD, Mickel Baas    ? Breast mass in female 10/06/2017  ? Complication of anesthesia   ? Problem waking up once  and a headache  ? Condyloma acuminatum 07/21/2006  ? Qualifier: History of  By: Oneal Grout MD, Manuela Schwartz    ? Depression   ? Eczema   ? Family history of anesthesia complication   ? Mother had problem waking up  ? Hx gestational diabetes   ? Laryngitis 02/15/2020  ? Menorrhagia 07/03/2014  ? Molluscum contagiosum infection 12/01/2018  ? Muscle strain of lower leg, right, initial encounter 03/05/2019  ? Pre-diabetes   ? Spinal headache   ? Thyroid fullness 04/02/2014  ? Urticaria   ? Vaginal lesion 11/13/2020  ? Voice hoarseness 05/22/2020  ? ? ? ?Past Surgical History:  ?Procedure Laterality Date  ? CESAREAN SECTION    ? CHOLECYSTECTOMY N/A 09/06/2013  ? Procedure:  LAPAROSCOPIC CHOLECYSTECTOMY;  Surgeon: Harl Bowie, MD;  Location: [facility];  Service: General;  Laterality: N/A;  ? GANGLION CYST EXCISION Right 08/03/2021  ? Procedure: RIGHT REMOVAL GANGLION OF WRIST;  Surgeon: Sherilyn Cooter, MD;  Location: [facility];  Service: Orthopedics;  Laterality: Right;  regional with monitored anesthesia care  ? NASAL SINUS SURGERY  2003  ? TUBAL LIGATION    ? ?Family History  ?Problem Relation Age of Onset  ? Hypertension Mother   ? Hyperlipidemia Mother   ? Cervical cancer Mother   ? Diabetes Father   ? Hypertension Father   ? Irregular heart beat Father   ? Breast cancer Paternal Aunt   ? Throat cancer Maternal Grandmother   ? Prostate cancer Maternal Grandfather   ? Diabetes Other   ? Allergic rhinitis Daughter   ? Allergic rhinitis Daughter   ? Allergic rhinitis Son   ?     milk  ? Stomach cancer Neg Hx   ? Colon cancer Neg Hx   ? Esophageal cancer Neg Hx   ? Pancreatic cancer Neg Hx   ? ?Social History  ? ?Tobacco Use  ? Smoking status: Never  ? Smokeless tobacco: Never  ?Vaping Use  ? Vaping Use: Never used  ?Substance Use Topics  ? Alcohol use: Yes  ?  Comment: occ.  ? Drug use: No  ? ?Current Outpatient Medications  ?Medication Sig Dispense Refill  ? cetirizine (ZYRTEC) 10 MG tablet Take 1 tablet (10 mg total) by mouth daily. 30 tablet 11  ? fluticasone (FLONASE) 50 MCG/ACT nasal spray Place 2 sprays into both nostrils daily. 16 g 5  ? ketorolac (ACULAR) 0.5 % ophthalmic solution 1 drop 2 (two) times daily.    ? levocetirizine (XYZAL) 5 MG tablet Take 1 tablet (5 mg total) by mouth in the morning and at bedtime. 60 tablet 5  ? linaclotide (LINZESS) 145 MCG CAPS capsule Take 145 mcg by mouth as needed.    ? metFORMIN (GLUCOPHAGE-XR) 500 MG 24 hr tablet Take 1 tablet (500 mg total) by mouth daily with breakfast. 90 tablet 3  ? Olopatadine HCl (PATADAY) 0.2 % SOLN Place 1 drop into both eyes 2 (two) times daily as needed. 2.5 mL 5  ? omeprazole (PRILOSEC) 20  MG capsule Take 20 mg by mouth daily.    ? oxyCODONE (ROXICODONE) 5 MG immediate release tablet Take 1 tablet (5 mg total) by mouth every 6 (six) hours as needed for up to 7 days for severe pain. 28 tablet 0  ? polyethylene glycol (MIRALAX / GLYCOLAX) 17 g packet Take 17 g by mouth daily as needed.    ? ?No current facility-administered medications for this visit.  ? ?Allergies  ?Allergen Reactions  ?  Amoxicillin Hives and Swelling  ? Penicillins Hives and Swelling  ? Shellfish Allergy Hives  ? ? ? ?Review of Systems: ?All systems reviewed and negative except where noted in HPI.  ? ?Lab Results  ?Component Value Date  ? WBC 9.2 03/25/2021  ? HGB 12.7 03/25/2021  ? HCT 36.5 03/25/2021  ? MCV 92 03/25/2021  ? PLT 324 03/25/2021  ? ? ?Lab Results  ?Component Value Date  ? CREATININE 0.64 07/28/2021  ? BUN 12 07/28/2021  ? NA 136 07/28/2021  ? K 4.4 07/28/2021  ? CL 105 07/28/2021  ? CO2 25 07/28/2021  ? ? ?Lab Results  ?Component Value Date  ? ALT 24 03/25/2021  ? AST 19 03/25/2021  ? ALKPHOS 104 03/25/2021  ? BILITOT <0.2 03/25/2021  ? ? ? ?Physical Exam: ?BP 136/82   Pulse 88   Ht '5\' 2"'$  (1.575 m)   Wt 175 lb 9.6 oz (79.7 kg)   LMP 07/08/2021 (Exact Date) Comment: UPT negative  SpO2 98%   BMI 32.12 kg/m?  ?Constitutional: Pleasant,well-developed, female in no acute distress. ?Abdominal: Soft, nondistended, mild RUQ TTP but nonfocal.  There are no masses palpable.  ?Extremities: no edema ?Neurological: Alert and oriented to person place and time. ?Skin: Skin is warm and dry. No rashes noted. ?Psychiatric: Normal mood and affect. Behavior is normal. ? ? ?ASSESSMENT AND PLAN: ?41 year old female here for reassessment of the following: ? ?Chronic constipation ?Bloating ?Right upper quadrant / epigastric pain ?GERD ? ?As above, she did not do well with MiraLAX despite higher dosing, feels much better with Linzess.  Will refill Linzess 145 mcg/day, recommend she take it every day.  Hopefully this will help with some  of her bloating, I also counseled her on a low FODMAP diet to see if that will help reduce some of her bloating and distention as well.  Also counseled her on trying IBgard to treat some of the cramps associated with

## 2021-08-13 ENCOUNTER — Other Ambulatory Visit (INDEPENDENT_AMBULATORY_CARE_PROVIDER_SITE_OTHER): Payer: Self-pay

## 2021-08-13 ENCOUNTER — Ambulatory Visit (INDEPENDENT_AMBULATORY_CARE_PROVIDER_SITE_OTHER): Payer: Self-pay | Admitting: Orthopedic Surgery

## 2021-08-13 ENCOUNTER — Other Ambulatory Visit: Payer: Self-pay

## 2021-08-13 ENCOUNTER — Encounter: Payer: Self-pay | Admitting: Orthopedic Surgery

## 2021-08-13 DIAGNOSIS — K219 Gastro-esophageal reflux disease without esophagitis: Secondary | ICD-10-CM

## 2021-08-13 DIAGNOSIS — R140 Abdominal distension (gaseous): Secondary | ICD-10-CM

## 2021-08-13 DIAGNOSIS — K5900 Constipation, unspecified: Secondary | ICD-10-CM

## 2021-08-13 DIAGNOSIS — M67431 Ganglion, right wrist: Secondary | ICD-10-CM

## 2021-08-13 DIAGNOSIS — R1011 Right upper quadrant pain: Secondary | ICD-10-CM

## 2021-08-13 LAB — HEPATIC FUNCTION PANEL
ALT: 18 U/L (ref 0–35)
AST: 13 U/L (ref 0–37)
Albumin: 4 g/dL (ref 3.5–5.2)
Alkaline Phosphatase: 92 U/L (ref 39–117)
Bilirubin, Direct: 0.1 mg/dL (ref 0.0–0.3)
Total Bilirubin: 0.2 mg/dL (ref 0.2–1.2)
Total Protein: 7.1 g/dL (ref 6.0–8.3)

## 2021-08-13 LAB — CBC WITH DIFFERENTIAL/PLATELET
Basophils Absolute: 0.1 10*3/uL (ref 0.0–0.1)
Basophils Relative: 1.1 % (ref 0.0–3.0)
Eosinophils Absolute: 0.2 10*3/uL (ref 0.0–0.7)
Eosinophils Relative: 2.6 % (ref 0.0–5.0)
HCT: 37.8 % (ref 36.0–46.0)
Hemoglobin: 12.7 g/dL (ref 12.0–15.0)
Lymphocytes Relative: 34.9 % (ref 12.0–46.0)
Lymphs Abs: 2.7 10*3/uL (ref 0.7–4.0)
MCHC: 33.6 g/dL (ref 30.0–36.0)
MCV: 92.9 fl (ref 78.0–100.0)
Monocytes Absolute: 0.6 10*3/uL (ref 0.1–1.0)
Monocytes Relative: 7.7 % (ref 3.0–12.0)
Neutro Abs: 4.2 10*3/uL (ref 1.4–7.7)
Neutrophils Relative %: 53.7 % (ref 43.0–77.0)
Platelets: 308 10*3/uL (ref 150.0–400.0)
RBC: 4.06 Mil/uL (ref 3.87–5.11)
RDW: 12.9 % (ref 11.5–15.5)
WBC: 7.8 10*3/uL (ref 4.0–10.5)

## 2021-08-13 NOTE — Progress Notes (Signed)
? ?Post-Op Visit Note ?  ?Patient: [name]           ?Date of Birth: 1980-12-24           ?MRN: [id number] ?Visit Date: 08/13/2021 ?PCP: Ezequiel Essex, MD ? ? ?Assessment & Plan: ? ?Chief Complaint:  ?Chief Complaint  ?Patient presents with  ? Right Wrist - Routine Post Op  ? ?Visit Diagnoses:  ?1. Ganglion cyst of dorsum of right wrist   ? ? ?Plan: Patient is two weeks s/p excision of right wrist dorsal ganglion.  She has done well postoperatively.  She has some mild swelling of the hand and wrist.  Her pain is improving.  Her incision is healing well with no surrounding erythema or induration.  Sutures removed.  She can see me back in another month or so.  ? ?Follow-Up Instructions: No follow-ups on file.  ? ?Orders:  ?No orders of the defined types were placed in this encounter. ? ?No orders of the defined types were placed in this encounter. ? ? ?Imaging: ?No results found. ? ?PMFS History: ?Patient Active Problem List  ? Diagnosis Date Noted  ? Ganglion cyst of dorsum of right wrist   ? Allergic reaction 06/07/2021  ? Hyperlipidemia 05/20/2021  ? Dorsal wrist ganglion 05/20/2021  ? Healthcare maintenance 05/20/2021  ? Abdominal pain 03/29/2021  ? Vision changes 03/01/2021  ? Screening examination for STD (sexually transmitted disease) 02/15/2020  ? 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 Hypochondriasis 08/19/2014  ? Cystic thyroid nodule 07/03/2014  ? Prediabetes 04/02/2014  ? Seasonal allergies 10/24/2012  ? Multiple food allergies 10/24/2012  ? Obesity, unspecified 07/23/2012  ? Fatty liver disease, nonalcoholic 40/98/1191  ? 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 ?Past Medical History:  ?Diagnosis Date  ? Allergy   ? Angio-edema   ? ANXIETY 12/31/2008  ? Qualifier: Diagnosis of  By: Nadara Eaton  MD, Mickel Baas    ? Breast mass in female 10/06/2017  ? Complication of anesthesia   ? Problem waking up once  and a headache  ? Condyloma acuminatum 07/21/2006  ? Qualifier: History of  By: Oneal Grout MD, Manuela Schwartz    ? Depression   ? Eczema   ? Family history of anesthesia complication   ? Mother had problem waking up  ? Hx gestational diabetes   ? Laryngitis 02/15/2020  ? Menorrhagia 07/03/2014  ? Molluscum contagiosum infection 12/01/2018  ? Muscle strain of lower leg, right, initial encounter 03/05/2019  ? Pre-diabetes   ? Spinal headache   ? Thyroid fullness 04/02/2014  ? Urticaria   ? Vaginal lesion 11/13/2020  ? Voice hoarseness 05/22/2020  ?  ?Family History  ?Problem Relation Age of Onset  ? Hypertension Mother   ? Hyperlipidemia Mother   ? Cervical cancer Mother   ? Diabetes Father   ? Hypertension Father   ? Irregular heart beat Father   ? Breast cancer Paternal Aunt   ? Throat cancer Maternal Grandmother   ? Prostate cancer Maternal Grandfather   ? Diabetes Other   ? Allergic rhinitis Daughter   ? Allergic rhinitis Daughter   ? Allergic rhinitis Son   ?     milk  ? Stomach cancer Neg Hx   ? Colon cancer Neg Hx   ? Esophageal cancer Neg Hx   ? Pancreatic cancer Neg Hx   ?  ?Past Surgical History:  ?Procedure Laterality Date  ?  CESAREAN SECTION    ? CHOLECYSTECTOMY N/A 09/06/2013  ? Procedure: LAPAROSCOPIC CHOLECYSTECTOMY;  Surgeon: Harl Bowie, MD;  Location: [facility];  Service: General;  Laterality: N/A;  ? GANGLION CYST EXCISION Right 08/03/2021  ? Procedure: RIGHT REMOVAL GANGLION OF WRIST;  Surgeon: Sherilyn Cooter, MD;  Location: [facility];  Service: Orthopedics;  Laterality: Right;  regional with monitored anesthesia care  ? NASAL SINUS SURGERY  2003  ? TUBAL LIGATION    ? ?Social History  ? ?Occupational History  ? Not on file  ?Tobacco Use  ? Smoking status: Never  ? Smokeless tobacco: Never  ?Vaping Use  ? Vaping Use: Never used  ?Substance and Sexual Activity  ? Alcohol use: Yes  ?  Comment: occ.  ? Drug use: No  ? Sexual activity: Yes  ?  Birth control/protection: Surgical  ? ? ? ?

## 2021-09-03 ENCOUNTER — Encounter: Payer: Self-pay | Admitting: Family

## 2021-09-03 DIAGNOSIS — J309 Allergic rhinitis, unspecified: Secondary | ICD-10-CM

## 2021-09-04 ENCOUNTER — Telehealth: Payer: Self-pay | Admitting: Gastroenterology

## 2021-09-04 NOTE — Telephone Encounter (Signed)
Good Morning Dr. Havery Moros, ? ? ?Patient called to cancel EGD with you on 4/17 at 7:30am due to a scheduling conflict. ? ? ?Patient stated that she will have to look at her work schedule and call back at a later time to reschedule  ?

## 2021-09-07 ENCOUNTER — Encounter: Payer: Self-pay | Admitting: Gastroenterology

## 2021-10-27 ENCOUNTER — Encounter: Payer: Self-pay | Admitting: *Deleted

## 2021-11-03 ENCOUNTER — Ambulatory Visit (INDEPENDENT_AMBULATORY_CARE_PROVIDER_SITE_OTHER): Payer: Self-pay | Admitting: Orthopedic Surgery

## 2021-11-03 DIAGNOSIS — M25531 Pain in right wrist: Secondary | ICD-10-CM

## 2021-11-03 DIAGNOSIS — M67439 Ganglion, unspecified wrist: Secondary | ICD-10-CM

## 2021-11-03 HISTORY — DX: Pain in right wrist: M25.531

## 2021-11-03 MED ORDER — MELOXICAM 7.5 MG PO TABS: 7.5000 mg | ORAL_TABLET | Freq: Every day | ORAL | 0 refills | Status: AC

## 2021-11-03 NOTE — Progress Notes (Signed)
Office Visit Note   Patient: [name]           Date of Birth: [birth year]           MRN: [id number] Visit Date: 11/03/2021              Requested by: Ezequiel Essex, MD [address] [place],  [place] [address] PCP: Ezequiel Essex, MD   Assessment & Plan: Visit Diagnoses:  1. Dorsal wrist ganglion   2. Pain in right wrist     Plan: Patient has multiple complaints today involving the right wrist.  She has some numbness at the dorsal aspect of her wrist over the previous ganglion cyst excision.  There is no evidence of cyst recurrence.  She also describes a new area of focal swelling or mass of the volar radial aspect of the wrist.  She also describes numbness in the thenar eminence.  Discussed that this could be a new volar carpal ganglion cyst.  She also describes significant weakness of her hand and wrist that is affecting her ability to work.  I will start her on an oral anti-inflammatory medication and refer her to hand therapy to work on strengthening to improve her function at work.  Also get an ultrasound of her right volar radial wrist to evaluate the swollen area.  Follow-Up Instructions: No follow-ups on file.   Orders:  No orders of the defined types were placed in this encounter.  No orders of the defined types were placed in this encounter.     Procedures: No procedures performed   Clinical Data: No additional findings.   Subjective: Chief Complaint  Patient presents with   Right Wrist - Numbness    This is a 41 year old right-hand-dominant female who is status post right dorsal wrist ganglion cyst excision in early March who presents with multiple complaints involving this wrist.  She describes numbness around the incision on the dorsal aspect of her hand.  She also describes an aching type pain that radiates from this dorsal wrist proximal to her elbow she also describes pain at the volar radial aspect of the wrist with a new area of focal swelling or  new cyst.  There is at the volar radial aspect of the wrist.  Says they will the area will enlarge with long activity.  She also describes numbness at the thenar eminence of the thumb.  This all began about a month ago.  She denies any pain associated with this new cyst or new lesion.  She also describes weakness in the wrist with her daily work activities of [employer].  She feels like she has less endurance in this wrist than she did previously.    Review of Systems   Objective: Vital Signs: There were no vitals taken for this visit.  Physical Exam Constitutional:      Appearance: Normal appearance.  Cardiovascular:     Rate and Rhythm: Normal rate.     Pulses: Normal pulses.  Pulmonary:     Effort: Pulmonary effort is normal.  Skin:    General: Skin is warm and dry.     Capillary Refill: Capillary refill takes less than 2 seconds.  Neurological:     Mental Status: She is alert.     Right Hand Exam   Tenderness  The patient is experiencing no tenderness.   Range of Motion  The patient has normal right wrist ROM.   Other  Erythema: absent Sensation: normal Pulse: present  Comments:  Well healed dorsal wrist incision. Approx 1x1 cm area of focal swelling at volar radial wrist.  Full and painless ROM of wrist or thumb.        Specialty Comments:  No specialty comments available.  Imaging: No results found.   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    Hypochondriasis 08/19/2014   Cystic thyroid nodule 07/03/2014   Prediabetes 04/02/2014   Seasonal allergies 10/24/2012   Multiple food allergies 10/24/2012   Obesity, unspecified 07/23/2012   Fatty liver disease, nonalcoholic 16/02/9603   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   Past Medical History:  Diagnosis Date   Allergy    Angio-edema    ANXIETY 12/31/2008   Qualifier: Diagnosis of  By: Nadara Eaton  MD, Mickel Baas     Breast mass in female 54/01/8118   Complication of anesthesia    Problem waking up once and a headache   Condyloma acuminatum 07/21/2006   Qualifier: History of  By: Oneal Grout MD, Manuela Schwartz     Depression    Eczema    Family history of anesthesia complication    Mother had problem waking up   Hx gestational diabetes    Laryngitis 02/15/2020   Menorrhagia 07/03/2014   Molluscum contagiosum infection 12/01/2018   Muscle strain of lower leg, right, initial encounter 03/05/2019   Pre-diabetes    Spinal headache    Thyroid fullness 04/02/2014   Urticaria    Vaginal lesion 11/13/2020   Voice hoarseness 05/22/2020    Family History  Problem Relation Age of Onset   Hypertension Mother    Hyperlipidemia Mother    Cervical cancer Mother    Diabetes Father    Hypertension Father    Irregular heart beat Father    Breast cancer Paternal Aunt    Throat cancer Maternal Grandmother    Prostate cancer Maternal Grandfather    Diabetes Other    Allergic rhinitis Daughter    Allergic rhinitis Daughter    Allergic rhinitis Son        milk   Stomach cancer Neg Hx    Colon cancer Neg Hx    Esophageal cancer Neg Hx    Pancreatic cancer Neg Hx     Past Surgical History:  Procedure Laterality Date   CESAREAN SECTION     CHOLECYSTECTOMY N/A 09/06/2013   Procedure: LAPAROSCOPIC CHOLECYSTECTOMY;  Surgeon: Harl Bowie, MD;  Location: [facility];  Service: General;  Laterality: N/A;   GANGLION CYST EXCISION Right 08/03/2021   Procedure: RIGHT REMOVAL GANGLION OF WRIST;  Surgeon:  Sherilyn Cooter, MD;  Location: [facility];  Service: Orthopedics;  Laterality: Right;  regional with monitored anesthesia care   NASAL SINUS SURGERY  2003   TUBAL LIGATION     Social History   Occupational History   Not on file  Tobacco Use   Smoking status: Never   Smokeless tobacco: Never  Vaping Use   Vaping Use: Never used  Substance and Sexual Activity   Alcohol use: Yes    Comment:  occ.   Drug use: No   Sexual activity: Yes    Birth control/protection: Surgical

## 2021-11-09 ENCOUNTER — Encounter: Payer: Self-pay | Admitting: Rehabilitative and Restorative Service Providers"

## 2021-11-11 ENCOUNTER — Ambulatory Visit
Admission: RE | Admit: 2021-11-11 | Discharge: 2021-11-11 | Disposition: A | Discharge status: Home / Self Care | Payer: Self-pay | Source: Ambulatory Visit | Attending: Orthopedic Surgery | Admitting: Orthopedic Surgery

## 2021-11-11 DIAGNOSIS — M25531 Pain in right wrist: Secondary | ICD-10-CM

## 2021-11-11 NOTE — Progress Notes (Unsigned)
    SUBJECTIVE:   CHIEF COMPLAINT / HPI:   Dry/itching feet:   PERTINENT  PMH / PSH: ***  OBJECTIVE:   There were no vitals taken for this visit.  ***  ASSESSMENT/PLAN:   No problem-specific Assessment & Plan notes found for this encounter.     Gladys Damme, MD [facility]

## 2021-11-12 ENCOUNTER — Ambulatory Visit (INDEPENDENT_AMBULATORY_CARE_PROVIDER_SITE_OTHER): Payer: Self-pay | Admitting: Family Medicine

## 2021-11-12 VITALS — BP 120/73 | HR 64 | Ht 62.0 in | Wt 173.2 lb

## 2021-11-12 DIAGNOSIS — L301 Dyshidrosis [pompholyx]: Secondary | ICD-10-CM

## 2021-11-12 MED ORDER — AMMONIUM LACTATE 12 % EX CREA: 1.0000 | TOPICAL_CREAM | CUTANEOUS | 0 refills | Status: DC | PRN

## 2021-11-12 NOTE — Patient Instructions (Signed)
I am prescribing a medicine for you to use going forward which should improve the bottom of your feet.  You can use this daily.  If your feet are not getting better or if they worsen I want you to follow back up.  I would follow-up in 1 month if they are not improving.

## 2021-11-12 NOTE — Progress Notes (Signed)
    SUBJECTIVE:   CHIEF COMPLAINT / HPI:   Dry/itchy feet: Has been going on for about 6 months.  Is present on both feet.  It is sometimes itchy.  She recently went to a salon and used a pumice stone to remove some of the dead skin but this was painful so she has not been back.  She has tried some athlete's foot medication for the past month or 2 without much benefit.  She denies any hot baths or showers on a regular basis that could be exacerbating it.  PERTINENT  PMH / PSH: None relevant  OBJECTIVE:   Ht '5\' 2"'$  (1.575 m)   Wt 173 lb 4 oz (78.6 kg)   LMP 11/06/2021   BMI 31.69 kg/m    General: NAD, pleasant, able to participate in exam Respiratory: No respiratory distress Skin: warm and dry, large areas of dry skin with thick keratotic tissue.  No erythema.  No drainage.  No isolated lesions.  Does not appear overly fungal on physical exam.  It is present on both feet but worse on the right. Psych: Normal affect and mood     ASSESSMENT/PLAN:   Dyshidrotic eczema: She has had dry itchy and peeling tissue on the bottom of both feet for about 6 months.  She previously thought it may be athlete's foot and tried some over-the-counter medications which did not improve her symptoms.  She has tried a few lotions which do not improve the symptoms.  Recently went to a salon where had pumice stone but this was painful and so she has not been back.  Overall this does appear consistent with dyshidrotic eczema.  We will treat with Lac-Hydrin and have her follow-up in about a month if it is not improving or sooner if it worsens.  Next steps could consider scraping to evaluate for fungal but I feel less likely this is the cause given the duration and her previous treatments.   Lurline Del, [place]

## 2021-11-17 ENCOUNTER — Encounter: Payer: Self-pay | Admitting: Rehabilitative and Restorative Service Providers"

## 2021-11-17 ENCOUNTER — Ambulatory Visit (INDEPENDENT_AMBULATORY_CARE_PROVIDER_SITE_OTHER): Payer: Self-pay | Admitting: Rehabilitative and Restorative Service Providers"

## 2021-11-17 ENCOUNTER — Other Ambulatory Visit: Payer: Self-pay

## 2021-11-17 DIAGNOSIS — M6281 Muscle weakness (generalized): Secondary | ICD-10-CM

## 2021-11-17 DIAGNOSIS — M25631 Stiffness of right wrist, not elsewhere classified: Secondary | ICD-10-CM

## 2021-11-17 DIAGNOSIS — M25632 Stiffness of left wrist, not elsewhere classified: Secondary | ICD-10-CM

## 2021-11-17 DIAGNOSIS — R202 Paresthesia of skin: Secondary | ICD-10-CM

## 2021-11-17 DIAGNOSIS — R600 Localized edema: Secondary | ICD-10-CM

## 2021-11-17 DIAGNOSIS — M25531 Pain in right wrist: Secondary | ICD-10-CM

## 2022-03-22 ENCOUNTER — Other Ambulatory Visit: Payer: Self-pay | Admitting: Obstetrics and Gynecology

## 2022-03-31 ENCOUNTER — Other Ambulatory Visit: Payer: Self-pay | Admitting: Family Medicine

## 2022-03-31 DIAGNOSIS — Z1231 Encounter for screening mammogram for malignant neoplasm of breast: Secondary | ICD-10-CM

## 2022-04-05 ENCOUNTER — Ambulatory Visit: Payer: Self-pay | Admitting: Family Medicine

## 2022-04-06 ENCOUNTER — Inpatient Hospital Stay: Admission: RE | Admit: 2022-04-06 | Payer: Self-pay | Source: Ambulatory Visit

## 2022-04-12 ENCOUNTER — Ambulatory Visit (INDEPENDENT_AMBULATORY_CARE_PROVIDER_SITE_OTHER): Payer: Self-pay | Admitting: Surgical

## 2022-04-12 ENCOUNTER — Ambulatory Visit (INDEPENDENT_AMBULATORY_CARE_PROVIDER_SITE_OTHER): Payer: Self-pay

## 2022-04-12 DIAGNOSIS — M25531 Pain in right wrist: Secondary | ICD-10-CM

## 2022-04-14 ENCOUNTER — Other Ambulatory Visit: Payer: Self-pay

## 2022-04-14 DIAGNOSIS — M25531 Pain in right wrist: Secondary | ICD-10-CM

## 2022-04-16 ENCOUNTER — Encounter: Payer: Self-pay | Admitting: Surgical

## 2022-04-16 NOTE — Progress Notes (Signed)
Office Visit Note   Patient: [name]           Date of Birth: 03/29/1981           MRN: [id number] Visit Date: 04/12/2022 Requested by: Ezequiel Essex, MD [address] [place],  [place] [address] PCP: Ezequiel Essex, MD  Subjective: Chief Complaint  Patient presents with   Right Hand - Pain    HPI: [name] is a 41 y.o. female who presents to the office reporting right wrist pain.  Patient complains of right wrist pain in the volar/radial aspect of the wrist.  She states pain has been ongoing for several months.  She has history of ganglion cyst excision by Dr. Tempie Donning that was done earlier this year and she states this helped a lot with her dorsal sided pain.  However, she subsequently developed another cyst which was evaluated with ultrasound earlier this year that showed volar radial cyst that communicates with the radioscaphoid joint.  She describes swelling that depends on her activity level and will typically cause pain in the wrist and occasionally radiate up to the proximal forearm on the volar side.  Because issues while she is working and she works as a Librarian, academic at [employer].  She notes cramping pain.  Also hit her thumb against a desk 4 months ago that caused increased pain at her IP joint in the right thumb.  No consistent numbness or tingling.  Symptoms do not wake her from sleep at night typically.  No radicular pain down the arm.  No neck pain..                ROS: All systems reviewed are negative as they relate to the chief complaint within the history of present illness.  Patient denies fevers or chills.  Assessment & Plan: Visit Diagnoses:  1. Pain in right wrist     Plan: Patient is a 41 year old female who presents for evaluation of right wrist pain primarily.  She has area of swelling in the volar/radial aspect of the wrist with swelling that depends on her activity level and pain that correlates with the amount of swelling she has.  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  She would like definitive management of the ganglion cyst as she has had previous aspiration of another dorsal ganglion cyst that did not work and eventually required excision by Dr. Tempie Donning.  Discussed patient with Dr. Erlinda Hong; he will see [name] after new MRI of the right wrist for further evaluation of the extent of the ganglion cyst.  She also has some findings that are concerning for de Quervain's tenosynovitis which we will evaluate with the MRI as well.  Follow-up with Dr. Erlinda Hong to review results and discuss surgical options.  Follow-Up Instructions: No follow-ups on file.   Orders:  Orders Placed This Encounter  Procedures   XR Hand Complete Right   No orders of the defined types were placed in this encounter.     Procedures: No procedures performed   Clinical Data: No additional findings.  Objective: Vital Signs: There were no vitals taken for this visit.  Physical Exam:  Constitutional: Patient appears well-developed HEENT:  Head: Normocephalic Eyes:EOM are normal Neck: Normal range of motion Cardiovascular: Normal rate Pulmonary/chest: Effort normal Neurologic: Patient  is alert Skin: Skin is warm Psychiatric: Patient has normal mood and affect  Ortho Exam: Ortho exam demonstrates right wrist with palpable radial pulse rated 2+.  Intact EPL, FPL, finger abduction.  Able to make a full composite fist.  Grip strength equivalent to the contralateral side.  She has tenderness over the first dorsal compartment with positive Finkelstein sign.  She also has tenderness over the radial artery with a area of swelling that is noted in this location where she localizes more of her pain.  No significant tenderness in the anatomic snuffbox, 3-4 portal, 4-5 portal, ulnar fovea.  She does have  some tenderness diffusely throughout the IP joint of the thumb.  She has full extension and flexion of the thumb.  Specialty Comments:  No specialty comments available.  Imaging: No results found.   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   Hypochondriasis 08/19/2014   Cystic thyroid nodule 07/03/2014   Prediabetes 04/02/2014   Seasonal allergies 10/24/2012   Multiple food allergies 10/24/2012   Obesity, unspecified 07/23/2012   Fatty liver disease, nonalcoholic 04/54/0981   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   Past Medical History:  Diagnosis Date   Allergy    Angio-edema    ANXIETY 12/31/2008   Qualifier: Diagnosis of  By: Nadara Eaton  MD, Mickel Baas     Breast mass in female 19/14/7829   Complication of anesthesia    Problem waking up once and a headache   Condyloma acuminatum 07/21/2006   Qualifier: History of  By: Oneal Grout MD, Manuela Schwartz     Depression    Eczema    Family history of anesthesia complication    Mother had problem waking up   Hx gestational diabetes    Laryngitis 02/15/2020   Menorrhagia 07/03/2014   Molluscum contagiosum infection 12/01/2018   Muscle strain of lower leg, right, initial encounter 03/05/2019   Pre-diabetes    Spinal headache    Thyroid fullness 04/02/2014   Urticaria    Vaginal lesion 11/13/2020   Voice hoarseness 05/22/2020    Family History  Problem Relation Age of Onset   Hypertension Mother    Hyperlipidemia Mother    Cervical  cancer Mother    Diabetes Father    Hypertension Father    Irregular heart beat Father    Breast cancer Paternal Aunt    Throat cancer Maternal Grandmother    Prostate cancer Maternal Grandfather    Diabetes Other    Allergic rhinitis Daughter    Allergic rhinitis Daughter    Allergic rhinitis Son        milk   Stomach cancer Neg Hx    Colon cancer Neg Hx    Esophageal cancer Neg Hx    Pancreatic cancer Neg Hx     Past Surgical History:  Procedure Laterality Date   CESAREAN SECTION     CHOLECYSTECTOMY N/A 09/06/2013   Procedure: LAPAROSCOPIC CHOLECYSTECTOMY;  Surgeon: Harl Bowie, MD;  Location: [facility];  Service: General;  Laterality: N/A;   GANGLION CYST  EXCISION Right 08/03/2021   Procedure: RIGHT REMOVAL GANGLION OF WRIST;  Surgeon: Sherilyn Cooter, MD;  Location: [facility];  Service: Orthopedics;  Laterality: Right;  regional with monitored anesthesia care   NASAL SINUS SURGERY  2003   TUBAL LIGATION     Social History   Occupational History   Not on file  Tobacco Use   Smoking status: Never   Smokeless tobacco: Never  Vaping Use   Vaping Use: Never used  Substance and Sexual Activity   Alcohol use: Yes    Comment: occ.   Drug use: No   Sexual activity: Yes    Birth control/protection: Surgical

## 2022-04-20 ENCOUNTER — Ambulatory Visit (INDEPENDENT_AMBULATORY_CARE_PROVIDER_SITE_OTHER): Payer: No Typology Code available for payment source | Admitting: Student

## 2022-04-20 ENCOUNTER — Ambulatory Visit
Admission: RE | Admit: 2022-04-20 | Discharge: 2022-04-20 | Disposition: A | Discharge status: Home / Self Care | Payer: No Typology Code available for payment source | Source: Ambulatory Visit | Attending: Family Medicine | Admitting: Family Medicine

## 2022-04-20 VITALS — BP 123/84 | HR 73 | Wt 170.1 lb

## 2022-04-20 DIAGNOSIS — J069 Acute upper respiratory infection, unspecified: Secondary | ICD-10-CM

## 2022-04-20 DIAGNOSIS — R0600 Dyspnea, unspecified: Secondary | ICD-10-CM

## 2022-04-20 HISTORY — DX: Dyspnea, unspecified: R06.00

## 2022-04-20 HISTORY — DX: Acute upper respiratory infection, unspecified: J06.9

## 2022-04-20 NOTE — Assessment & Plan Note (Signed)
Most likely, the patient is experiencing dyspnea secondary to respiratory illness that was caused by viral infection.  No acute concern for pulmonary embolism, pulmonary effusion, pericarditis, acute heart failure as her physical exam does not point to any of these differentials and patient remains hemodynamically stable on room air without tachycardia or tachypnea.  DOE could also be caused by asthma, COPD, or pneumonia. Possibly PNA with focal diminishment on exam but while hemodynamically stable, making this less likely. However, given my findings on PE, will continue with a chest XR for further assessment.  No history of COPD or asthma. No signs of GERD (treat with PPI), or rhinosinusitis. Honey can provide symptomatic relief, can also try humidifier at home, Tylenol/Ibuprofen as needed. If symptoms remain in the next couple of weeks or worsen in next couple of days, patient was instructed to return.

## 2022-04-20 NOTE — Patient Instructions (Addendum)
It was great to see you today! Thank you for choosing [facility] for your primary care. [name] was seen for follow up.  Today we addressed: Tylenol/Ibuprofen as needed   This is most likely a viral infection. This will take time to get over. The treatment for this is supportive care. You can alternate Tylenol and Ibuprofen for pain or fever every 3 hours (there should be 6 hours in between each dose of Tylenol, and 6 hours in between doses of Ibuprofen). You can give a teaspoon of honey by itself or mixed with water to help cough. Steam baths, Vicks vapor rub, a humidifier and nasal saline spray can help with congestion.   It is important to keep hydrated throughout this time!  Frequent hand washing to prevent recurrent illnesses is important.   Please come back for recurrent symptoms that are not improving in 1-2 weeks, unable to keep fluids down, or any concerning symptoms to you.   Please return if you experience worsening chest pain or shortness of breath over the next couple of days. If your symptoms don't significantly improved by the beginning of next week, please return  If you begin to experience confusion, extreme dyspnea, chest pain, worsening swelling in your lower extremities please see a healthcare provider immediately.   Chest Xray: Please go to [address] for chest x-ray, walk-ins are welcome.   If you haven't already, sign up for My Chart to have easy access to your labs results, and communication with your primary care physician.  I recommend that you always bring your medications to each appointment as this makes it easy to ensure you are on the correct medications and helps [place] not miss refills when you need them. Call the clinic at [phone] if your symptoms worsen or you have any concerns.  You should return to our clinic Return if symptoms worsen or fail to improve. Please arrive 15 minutes before your appointment to ensure smooth check in  process.  We appreciate your efforts in making this happen.  Thank you for allowing me to participate in your care, Erskine Emery, MD 04/20/2022, 9:15 AM PGY-2, [place]

## 2022-04-20 NOTE — Assessment & Plan Note (Signed)
Conservative treatment measures with strict return precautions, see dyspnea below.

## 2022-04-20 NOTE — Progress Notes (Signed)
SUBJECTIVE:   CHIEF COMPLAINT / HPI:   URI  DOE:  Patient complains of chills, dyspnea, and nasal congestion. She has been experiencing muscle tension in her chest and muscle fatigue.  Symptoms began 04/16/22 and originally improved, but were worse today when she woke up. The cough is non-productive, without wheezing, dyspnea or hemoptysis, improving over time and is aggravated by nothing. She feels like her throat has been dry and when coughing, was wet with green sputum and has since improved and transitioned to a dry cough. She notes that her husband and older daughter were sick prior to her onset of symptoms.  Associated symptoms include:chills and shortness of breath.  Patient does not have a history of asthma.  Patient does have a history of environmental allergens. Patient does not have recent travel.  Patient does not have a history of smoking.  Patient  as not previous Chest X-ray.  She has had COVID 4 times at this point.  She reports that she has been having significant dyspnea with exertion and even when talking since onset of symptoms.   PERTINENT  PMH / PSH: allergy, NAFLD, pituitary adenoma, prediabetes, HLD     OBJECTIVE:  BP 123/84   Pulse 73   Wt 170 lb 2 oz (77.2 kg)   LMP 03/27/2022   SpO2 100%   BMI 31.12 kg/m  Physical Exam Vitals reviewed.  Constitutional:      General: She is not in acute distress.    Appearance: Normal appearance. She is not toxic-appearing.     Comments: Speaking in full sentences and appropriate   HENT:     Head: Normocephalic.     Nose: Nose normal.     Mouth/Throat:     Mouth: Mucous membranes are moist.  Eyes:     Conjunctiva/sclera: Conjunctivae normal.  Cardiovascular:     Rate and Rhythm: Normal rate and regular rhythm.     Comments: No BLE swelling or noticeable JVD Pulmonary:     Effort: Pulmonary effort is normal.     Comments: Faint crackles with diminished breath sounds of the RLL  Musculoskeletal:     Cervical  back: Normal range of motion.  Skin:    Capillary Refill: Capillary refill takes less than 2 seconds.  Neurological:     General: No focal deficit present.     Mental Status: She is alert.  Psychiatric:        Mood and Affect: Mood normal.        Behavior: Behavior normal.      ASSESSMENT/PLAN:  Upper respiratory tract infection, unspecified type Assessment & Plan: Conservative treatment measures with strict return precautions, see dyspnea below.  Orders: -     DG Chest 2 View; Future -     COVID-19, Flu A+B and RSV  Dyspnea, unspecified type Assessment & Plan: Most likely, the patient is experiencing dyspnea secondary to respiratory illness that was caused by viral infection.  No acute concern for pulmonary embolism, pulmonary effusion, pericarditis, acute heart failure as her physical exam does not point to any of these differentials and patient remains hemodynamically stable on room air without tachycardia or tachypnea.  DOE could also be caused by asthma, COPD, or pneumonia. Possibly PNA with focal diminishment on exam but while hemodynamically stable, making this less likely. However, given my findings on PE, will continue with a chest XR for further assessment.  No history of COPD or asthma. No signs of GERD (treat with PPI), or rhinosinusitis. Honey can  provide symptomatic relief, can also try humidifier at home, Tylenol/Ibuprofen as needed. If symptoms remain in the next couple of weeks or worsen in next couple of days, patient was instructed to return.    Orders: -     DG Chest 2 View; Future -     COVID-19, Flu A+B and RSV   Return if symptoms worsen or fail to improve. Erskine Emery, MD 04/20/2022, 9:31 AM   PGY-2, [place]

## 2022-04-22 ENCOUNTER — Encounter: Payer: Self-pay | Admitting: Student

## 2022-04-22 LAB — COVID-19, FLU A+B AND RSV
Influenza A, NAA: NOT DETECTED
Influenza B, NAA: NOT DETECTED
RSV, NAA: NOT DETECTED
SARS-CoV-2, NAA: NOT DETECTED

## 2022-04-23 ENCOUNTER — Telehealth: Payer: Self-pay | Admitting: Family Medicine

## 2022-04-23 NOTE — Telephone Encounter (Signed)
Patient walked in to request a letter extending her absence from work for Friday.  Want a letter sent to her Mychart.  Her ph # is [phone].

## 2022-04-28 ENCOUNTER — Ambulatory Visit: Payer: Self-pay | Admitting: Orthopaedic Surgery

## 2022-04-30 ENCOUNTER — Ambulatory Visit (INDEPENDENT_AMBULATORY_CARE_PROVIDER_SITE_OTHER): Payer: Self-pay | Admitting: Orthopaedic Surgery

## 2022-04-30 DIAGNOSIS — M67431 Ganglion, right wrist: Secondary | ICD-10-CM

## 2022-04-30 HISTORY — DX: Ganglion, right wrist: M67.431

## 2022-04-30 NOTE — Progress Notes (Signed)
Office Visit Note   Patient: [name]           Date of Birth: July 10, 1980           MRN: [id number] Visit Date: 04/30/2022              Requested by: Ezequiel Essex, MD [address] [place],  [place] [address] PCP: Ezequiel Essex, MD   Assessment & Plan: Visit Diagnoses:  1. Ganglion cyst of volar aspect of right wrist     Plan: Impression is right volar wrist ganglion cyst.  At this point, conservative treatments fail to provide any significant relief and the pain is severely affecting ADLs and quality of life.  Based on treatment options, the patient has elected to move forward with right volar wrist ganglion cyst excision and tenolysis.  We have discussed the surgical risks that include but are not limited to infection, neurovascular injury, incomplete relief of pain.  Recovery and prognosis were also reviewed.  [name] will call the patient to confirm surgery date.  Current anticoagulants: No antithrombotic Postop anticoagulation: None Diabetic: No  Prior DVT/PE: No Tobacco use: No Clearances needed for surgery: None Anticipated discharge dispo: outpatient surgery   Follow-Up Instructions: No follow-ups on file.   Orders:  No orders of the defined types were placed in this encounter.  No orders of the defined types were placed in this encounter.     Procedures: No procedures performed   Clinical Data: No additional findings.   Subjective: Chief Complaint  Patient presents with   Right Wrist - Pain    HPI [name] is a 41 year old female who comes in for surgical consultation for a symptomatic right volar wrist ganglion cyst.  She had a dorsal ganglion cyst removed from the same wrist by Dr. Tempie Donning and she would like to have the volar one removed as well.  This is causing her pain and problems with hand function. Review of Systems  Constitutional: Negative.   HENT: Negative.    Eyes: Negative.   Respiratory: Negative.    Cardiovascular: Negative.    Endocrine: Negative.   Musculoskeletal: Negative.   Neurological: Negative.   Hematological: Negative.   Psychiatric/Behavioral: Negative.    All other systems reviewed and are negative.    Objective: Vital Signs: LMP 03/27/2022 Comment: tubes tied  Physical Exam Vitals and nursing note reviewed.  Constitutional:      Appearance: She is well-developed.  HENT:     Head: Normocephalic and atraumatic.  Pulmonary:     Effort: Pulmonary effort is normal.  Abdominal:     Palpations: Abdomen is soft.  Musculoskeletal:     Cervical back: Neck supple.  Skin:    General: Skin is warm.     Capillary Refill: Capillary refill takes less than 2 seconds.  Neurological:     Mental Status: She is alert and oriented to person, place, and time.  Psychiatric:        Behavior: Behavior normal.        Thought Content: Thought content normal.        Judgment: Judgment normal.     Ortho Exam Exam nation of the right wrist shows a circular firm nonmobile mass on the volar aspect of the wrist near the radial artery.  No aggressive or skin findings.  Location and characteristics consistent with ganglion cyst. Specialty Comments:  No specialty comments available.  Imaging: No results found.   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   Coalition, 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   Hypochondriasis  08/19/2014   Cystic thyroid nodule 07/03/2014   Prediabetes 04/02/2014   Seasonal allergies 10/24/2012   Multiple food allergies 10/24/2012   Obesity, unspecified 07/23/2012   Fatty liver disease, nonalcoholic 67/67/2094   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   Past Medical History:  Diagnosis Date   Allergy    Angio-edema    ANXIETY 12/31/2008   Qualifier: Diagnosis of  By: Nadara Eaton  MD, Mickel Baas     Breast mass in female 70/96/2836   Complication of anesthesia    Problem waking up once and a headache   Condyloma acuminatum 07/21/2006   Qualifier: History of  By: Oneal Grout MD, Manuela Schwartz     Depression    Eczema    Family history of anesthesia complication    Mother had problem waking up   Hx gestational diabetes    Laryngitis 02/15/2020   Menorrhagia 07/03/2014   Molluscum contagiosum infection 12/01/2018   Muscle strain of lower leg, right, initial encounter 03/05/2019   Pre-diabetes    Spinal headache    Thyroid fullness 04/02/2014   Urticaria    Vaginal lesion 11/13/2020   Voice hoarseness 05/22/2020    Family History  Problem Relation Age of Onset   Hypertension Mother    Hyperlipidemia Mother    Cervical cancer Mother    Diabetes Father    Hypertension Father    Irregular heart beat Father    Breast cancer Paternal Aunt    Throat cancer Maternal Grandmother    Prostate cancer Maternal Grandfather    Diabetes Other    Allergic rhinitis Daughter    Allergic rhinitis Daughter    Allergic rhinitis Son        milk   Stomach cancer Neg Hx    Colon cancer Neg Hx    Esophageal cancer Neg Hx    Pancreatic cancer Neg Hx     Past Surgical History:  Procedure Laterality Date   CESAREAN SECTION     CHOLECYSTECTOMY N/A 09/06/2013   Procedure: LAPAROSCOPIC CHOLECYSTECTOMY;  Surgeon: Harl Bowie, MD;  Location: [facility];  Service: General;  Laterality: N/A;   GANGLION CYST EXCISION Right 08/03/2021   Procedure: RIGHT REMOVAL GANGLION OF WRIST;  Surgeon: Sherilyn Cooter, MD;   Location: [facility];  Service: Orthopedics;  Laterality: Right;  regional with monitored anesthesia care   NASAL SINUS SURGERY  2003   TUBAL LIGATION     Social History   Occupational History   Not on file  Tobacco Use   Smoking status: Never   Smokeless tobacco: Never  Vaping Use   Vaping Use: Never used  Substance and Sexual Activity   Alcohol use: Yes    Comment: occ.   Drug use: No   Sexual activity: Yes    Birth control/protection: Surgical

## 2022-05-04 ENCOUNTER — Ambulatory Visit (INDEPENDENT_AMBULATORY_CARE_PROVIDER_SITE_OTHER): Payer: No Typology Code available for payment source | Admitting: Family Medicine

## 2022-05-04 ENCOUNTER — Encounter: Payer: Self-pay | Admitting: Family Medicine

## 2022-05-04 VITALS — BP 102/62 | HR 66 | Ht 62.0 in | Wt 170.0 lb

## 2022-05-04 DIAGNOSIS — E782 Mixed hyperlipidemia: Secondary | ICD-10-CM

## 2022-05-04 DIAGNOSIS — D353 Benign neoplasm of craniopharyngeal duct: Secondary | ICD-10-CM

## 2022-05-04 DIAGNOSIS — D352 Benign neoplasm of pituitary gland: Secondary | ICD-10-CM

## 2022-05-04 DIAGNOSIS — N926 Irregular menstruation, unspecified: Secondary | ICD-10-CM

## 2022-05-04 DIAGNOSIS — Z1159 Encounter for screening for other viral diseases: Secondary | ICD-10-CM

## 2022-05-04 DIAGNOSIS — R5383 Other fatigue: Secondary | ICD-10-CM

## 2022-05-04 DIAGNOSIS — K760 Fatty (change of) liver, not elsewhere classified: Secondary | ICD-10-CM

## 2022-05-04 DIAGNOSIS — Z0000 Encounter for general adult medical examination without abnormal findings: Secondary | ICD-10-CM

## 2022-05-04 DIAGNOSIS — Z23 Encounter for immunization: Secondary | ICD-10-CM

## 2022-05-04 DIAGNOSIS — R7303 Prediabetes: Secondary | ICD-10-CM

## 2022-05-04 NOTE — Progress Notes (Signed)
    SUBJECTIVE:   CHIEF COMPLAINT / HPI:   Annual physical  [name] is a pleasant 41 y.o. woman who presents for her annual physical exam.  Medications and allergies updated.  Vaccines due: Flu, COVID booster Screenings due: Hep C Pap: UTD, last 10/20/2017 Mammogram: Cystic breasts Colonoscopy: Not yet started Lung CT: Not discussed DEXA: Not applicable   Menstrual abnormalities [name] also reports concerns about her menses and wonders if she is possibly going into early menopause.  She reports feeling sleepy all the time, large weight fluctuations, intermittent nausea and shakiness, and low libido.  Last 3 periods were not normal for her.  While they did happen approximately every month, the timing varied by many days.  These periods were also very heavy with lots of cramping.  She also did not undergo other prodromal signs of menstrual onset.   She is concerned about possible menopause.  She reports her period started 41 years old.  Her own mother started menopause around 66 years old.  She also notes her mother died from cervical cancer.  PERTINENT  PMH / PSH: Pituitary adenoma, cystic thyroid nodule, hepatic steatosis, HLD, prediabetes   OBJECTIVE:   BP 102/62   Pulse 66   Ht '5\' 2"'$  (1.575 m)   Wt 170 lb (77.1 kg)   LMP 04/23/2022 Comment: tubes tied  SpO2 98%   BMI 31.09 kg/m    PHQ-9:     04/20/2022    8:47 AM 11/12/2021    9:04 AM 06/05/2021    4:26 PM  Depression screen PHQ 2/9  Decreased Interest 1 1 0  Down, Depressed, Hopeless 0 0 0  PHQ - 2 Score 1 1 0  Altered sleeping '2 1 2  '$ Tired, decreased energy '2 3 2  '$ Change in appetite 0 1 0  Feeling bad or failure about yourself  0 0 0  Trouble concentrating 0 1 0  Moving slowly or fidgety/restless 0 0 0  Suicidal thoughts 0 0 0  PHQ-9 Score '5 7 4    '$ Physical Exam General: Awake, alert, oriented HEENT: PERRL, bilateral TM pearly pink and flat, bilateral external auditory canals with minimal cerumen  burden, no lesions, nasal mucosa slightly edematous, oral mucosa pink, moist, without lesion, intact dentition without obvious cavity Lymph: No palpable lymphedema of head or neck, thyroid normal to palpation Cardiovascular: Regular rate and rhythm, S1 and S2 present, no murmurs auscultated Respiratory: Lung fields clear to auscultation bilaterally  ASSESSMENT/PLAN:   Healthcare maintenance - Flu and COVID vaccines today, tolerated well - Hepatitis C screening - BMP, CBC, A1c, lipids  PITUITARY ADENOMA Last brain imaging February 2023 stable from 2018.  Given symptoms of fatigue and menstrual changes, will obtain labs today for surveillance. - Prolactin - Insulin growth factor - Beta hCG quant (urine unable to be collected due to bathrooms out of order)  Other fatigue TSH today.    Ezequiel Essex, MD [place]

## 2022-05-04 NOTE — Patient Instructions (Signed)
It was wonderful to see you today. Thank you for allowing me to be a part of your care. Below is a short summary of what we discussed at your visit today:  Physical Exam Physical exam today looked normal. We got many labs today to screen for cholesterol, blood sugar, thyroid, and anemia. If the results are normal, I will send you a letter or MyChart message. If the results are abnormal, I will give you a call.    I will also order some labs to monitor your pituitary adenoma. No need for MRI today, give your last one was in February and unchanged from 2018.   Pelvic complaints Please make an appointment on your way out to come back for a PAP smear and vaginal exam.   Vaccines Today you received the annual flu vaccine and COVID booster. You may experience some residual soreness at the injection site.  Gentle stretches and regular use of that arm will help speed up your recovery.  As the vaccines are giving your immune system a "practice run" against specific infections, you may feel a little under the weather for the next several days.  We recommend rest as needed and hydrating.   Please bring all of your medications to every appointment!  If you have any questions or concerns, please do not hesitate to contact us via phone or MyChart message.   Ezequiel Essex, MD

## 2022-05-05 ENCOUNTER — Encounter: Payer: Self-pay | Admitting: Family Medicine

## 2022-05-05 DIAGNOSIS — R5383 Other fatigue: Secondary | ICD-10-CM

## 2022-05-05 HISTORY — DX: Other fatigue: R53.83

## 2022-05-05 LAB — LIPID PANEL
Chol/HDL Ratio: 4.9 ratio — ABNORMAL HIGH (ref 0.0–4.4)
Cholesterol, Total: 212 mg/dL — ABNORMAL HIGH (ref 100–199)
HDL: 43 mg/dL (ref >39)
LDL Chol Calc (NIH): 131 mg/dL — ABNORMAL HIGH (ref 0–99)
Triglycerides: 214 mg/dL — ABNORMAL HIGH (ref 0–149)
VLDL Cholesterol Cal: 38 mg/dL (ref 5–40)

## 2022-05-05 LAB — BASIC METABOLIC PANEL
BUN/Creatinine Ratio: 22 (ref 9–23)
BUN: 17 mg/dL (ref 6–24)
CO2: 21 mmol/L (ref 20–29)
Calcium: 8.7 mg/dL (ref 8.7–10.2)
Chloride: 102 mmol/L (ref 96–106)
Creatinine, Ser: 0.76 mg/dL (ref 0.57–1.00)
Glucose: 116 mg/dL — ABNORMAL HIGH (ref 70–99)
Potassium: 4.6 mmol/L (ref 3.5–5.2)
Sodium: 136 mmol/L (ref 134–144)
eGFR: 101 mL/min/{1.73_m2} (ref >59)

## 2022-05-05 LAB — HEPATITIS C ANTIBODY: Hep C Virus Ab: NONREACTIVE

## 2022-05-05 LAB — CBC
Hematocrit: 37.3 % (ref 34.0–46.6)
Hemoglobin: 12.7 g/dL (ref 11.1–15.9)
MCH: 31.2 pg (ref 26.6–33.0)
MCHC: 34 g/dL (ref 31.5–35.7)
MCV: 92 fL (ref 79–97)
Platelets: 327 10*3/uL (ref 150–450)
RBC: 4.07 x10E6/uL (ref 3.77–5.28)
RDW: 11.8 % (ref 11.7–15.4)
WBC: 6.8 10*3/uL (ref 3.4–10.8)

## 2022-05-05 LAB — HEMOGLOBIN A1C
Est. average glucose Bld gHb Est-mCnc: 134 mg/dL
Hgb A1c MFr Bld: 6.3 % — ABNORMAL HIGH (ref 4.8–5.6)

## 2022-05-05 LAB — PROLACTIN: Prolactin: 15.4 ng/mL (ref 4.8–23.3)

## 2022-05-05 LAB — BETA HCG QUANT (REF LAB): hCG Quant: <1 m[IU]/mL

## 2022-05-05 LAB — TSH: TSH: 1.18 u[IU]/mL (ref 0.450–4.500)

## 2022-05-05 LAB — INSULIN-LIKE GROWTH FACTOR: Insulin-Like GF-1: 199 ng/mL (ref 74–239)

## 2022-05-05 NOTE — Assessment & Plan Note (Signed)
Last brain imaging February 2023 stable from 2018.  Given symptoms of fatigue and menstrual changes, will obtain labs today for surveillance. - Prolactin - Insulin growth factor - Beta hCG quant (urine unable to be collected due to bathrooms out of order)

## 2022-05-05 NOTE — Assessment & Plan Note (Signed)
TSH today 

## 2022-05-05 NOTE — Assessment & Plan Note (Signed)
-   Flu and COVID vaccines today, tolerated well - Hepatitis C screening - BMP, CBC, A1c, lipids

## 2022-05-07 ENCOUNTER — Ambulatory Visit
Admission: RE | Admit: 2022-05-07 | Discharge: 2022-05-07 | Disposition: A | Discharge status: Home / Self Care | Payer: No Typology Code available for payment source | Source: Ambulatory Visit | Attending: Surgical | Admitting: Surgical

## 2022-05-07 ENCOUNTER — Ambulatory Visit (INDEPENDENT_AMBULATORY_CARE_PROVIDER_SITE_OTHER): Payer: No Typology Code available for payment source | Admitting: Family Medicine

## 2022-05-07 ENCOUNTER — Encounter: Payer: Self-pay | Admitting: Family Medicine

## 2022-05-07 ENCOUNTER — Other Ambulatory Visit (HOSPITAL_COMMUNITY)
Admission: RE | Admit: 2022-05-07 | Discharge: 2022-05-07 | Disposition: A | Discharge status: Home / Self Care | Payer: Self-pay | Source: Ambulatory Visit | Attending: Family Medicine | Admitting: Family Medicine

## 2022-05-07 VITALS — BP 114/64 | HR 72 | Ht 62.0 in | Wt 171.0 lb

## 2022-05-07 DIAGNOSIS — N63 Unspecified lump in unspecified breast: Secondary | ICD-10-CM

## 2022-05-07 DIAGNOSIS — R1031 Right lower quadrant pain: Secondary | ICD-10-CM

## 2022-05-07 DIAGNOSIS — Z124 Encounter for screening for malignant neoplasm of cervix: Secondary | ICD-10-CM

## 2022-05-07 DIAGNOSIS — Z113 Encounter for screening for infections with a predominantly sexual mode of transmission: Secondary | ICD-10-CM

## 2022-05-07 DIAGNOSIS — M25531 Pain in right wrist: Secondary | ICD-10-CM

## 2022-05-07 HISTORY — DX: Right lower quadrant pain: R10.31

## 2022-05-07 LAB — POCT URINALYSIS DIP (MANUAL ENTRY)
Bilirubin, UA: NEGATIVE
Blood, UA: NEGATIVE
Glucose, UA: NEGATIVE mg/dL
Ketones, POC UA: NEGATIVE mg/dL
Leukocytes, UA: NEGATIVE
Nitrite, UA: NEGATIVE
Protein Ur, POC: NEGATIVE mg/dL
Spec Grav, UA: 1.01 (ref 1.010–1.025)
Urobilinogen, UA: 0.2 E.U./dL
pH, UA: 6.5 (ref 5.0–8.0)

## 2022-05-07 NOTE — Assessment & Plan Note (Signed)
Will obtain UA today.  STI screening with Pap smear.  Will follow results.  Could consider ultrasound if no etiology discovered.

## 2022-05-07 NOTE — Assessment & Plan Note (Signed)
Pap today.  Bimanual exam unremarkable.  Will follow results.

## 2022-05-07 NOTE — Patient Instructions (Signed)
It was wonderful to see you today. Thank you for allowing me to be a part of your care. Below is a short summary of what we discussed at your visit today:  PAP smear and STI testing Today collected your Pap smear to screen for cervical cancer. With that same sample, we will also test for gonorrhea, chlamydia, and trichomonas. If the results are normal, I will send you a letter or MyChart message. If the results are abnormal, I will give you a call.    Pelvic pain, right lower abdominal pain Lets wait for the urine sample, Pap smear, and STI testing to result.  If all these are negative and do not really explain why you are having this pain, then we can move onto a pelvic ultrasound.  In order to get a good view, this will likely be an ultrasound with views both across the abdomen and also intravaginally.  Breast nodules I am sending for diagnostic mammogram at the breast center. You will call them directly to make an appointment at your convenience. Information below.      Please bring all of your medications to every appointment!  If you have any questions or concerns, please do not hesitate to contact us via phone or MyChart message.   Ezequiel Essex, MD

## 2022-05-07 NOTE — Progress Notes (Signed)
    SUBJECTIVE:   CHIEF COMPLAINT / HPI:   PAP smear, STI check -Concern for specific exposure?  None -Contraception: BTL -LMP: 04/23/2022 -PAP: 10/20/2017 NILM negative for HPV -Indication for HIV PrEP: None  Left breast mass Previously with bilateral lateral breast masses that would worsen with menses and resolve spontaneously.  Patient feels as though the masses of her lateral left breast have not resolved as the ones on the right have.  Due to family history, she has already started surveillance mammography.  Last mammography 11/25/2020 BI-RADS 1, negative.   Family history: - Mom died of cervical cancer - 5 family members in immediate family with breast cancer - Female genitourinary cancers in family  Suprapubic and RLQ discomfort Worse with urination. No clear burning with urination. No fever or chills.   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   Hypochondriasis 08/19/2014   Cystic thyroid nodule 07/03/2014   Prediabetes 04/02/2014   Seasonal allergies 10/24/2012   Multiple food allergies 10/24/2012   Obesity, unspecified 07/23/2012   Fatty liver disease, nonalcoholic 40/81/4481   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    OBJECTIVE:   BP 114/64   Pulse 72   Ht '5\' 2"'$  (1.575 m)   Wt 171 lb (77.6 kg)   LMP 04/23/2022 Comment: tubes tied  SpO2 98%   BMI 31.28 kg/m    Physical Exam General: Awake, alert, oriented, no acute  distress Respiratory: Normal work of breathing, no respiratory distress Breast: Right breast unremarkable to palpation and inspection, left breast with lateral palpable masses, normal overlying skin, no erythema or discharge Neuro: Cranial nerves II through X grossly intact, able to move all extremities spontaneously Vulva: Normal appearing vulva without rashes, lesions, or deformities Vagina: Pale pink rugated vaginal tissue with small erythematous lesion at 10 o'clock, physiologic discharge of whitish color, cervix without lesion or overt tenderness with swab  Sensitive exam performed with chaperone in the room:  Jazmin Hartsell, CMA  ASSESSMENT/PLAN:   Right lower quadrant abdominal pain Will obtain UA today.  STI screening with Pap smear.  Will follow results.  Could consider ultrasound if no etiology discovered.  Cervical cancer screening Pap today.  Bimanual exam unremarkable.  Will follow results.  Breast nodule Palpable lateral left breast masses, although unclear if lymph versus duct.  Given family history and presence of breast nodules, will send for diagnostic mammogram.    Ezequiel Essex, MD [place]

## 2022-05-07 NOTE — Assessment & Plan Note (Signed)
Palpable lateral left breast masses, although unclear if lymph versus duct.  Given family history and presence of breast nodules, will send for diagnostic mammogram.

## 2022-05-10 ENCOUNTER — Telehealth: Payer: Self-pay | Admitting: Orthopaedic Surgery

## 2022-05-10 NOTE — Telephone Encounter (Signed)
She doesn't.  I'll give [name] a surgery sheet

## 2022-05-10 NOTE — Telephone Encounter (Signed)
I called patient and advised.  She would like to know about the MRI results. She has seen that she has a perforation and would like to know if you will fix that when you operate for the cyst. She is having pain in the hand and does not know if this is causing issues as well. She would like to speak with you if possible.  I cancelled appt that she had scheduled for Friday per your message.

## 2022-05-10 NOTE — Telephone Encounter (Signed)
Patient states she had an Mri and was told by Dr. Erlinda Hong that she will be having surgery, then she received a call Friday stating that she need to come in for a follow up appt. She is confused as to what to do. Please Advise. [phone]

## 2022-05-10 NOTE — Telephone Encounter (Signed)
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

## 2022-05-11 LAB — CYTOLOGY - PAP
Chlamydia: NEGATIVE
Comment: NEGATIVE
Comment: NEGATIVE
Comment: NEGATIVE
Comment: NORMAL
Diagnosis: NEGATIVE
High risk HPV: NEGATIVE
Neisseria Gonorrhea: NEGATIVE
Trichomonas: NEGATIVE

## 2022-05-11 NOTE — Telephone Encounter (Signed)
I called patient and advised. 

## 2022-05-14 ENCOUNTER — Encounter (HOSPITAL_BASED_OUTPATIENT_CLINIC_OR_DEPARTMENT_OTHER): Payer: Self-pay | Admitting: Orthopaedic Surgery

## 2022-05-14 ENCOUNTER — Ambulatory Visit: Payer: No Typology Code available for payment source | Admitting: Orthopaedic Surgery

## 2022-05-14 ENCOUNTER — Other Ambulatory Visit: Payer: Self-pay

## 2022-05-25 ENCOUNTER — Other Ambulatory Visit: Payer: Self-pay | Admitting: Family Medicine

## 2022-05-25 DIAGNOSIS — N63 Unspecified lump in unspecified breast: Secondary | ICD-10-CM

## 2022-05-26 ENCOUNTER — Ambulatory Visit (HOSPITAL_BASED_OUTPATIENT_CLINIC_OR_DEPARTMENT_OTHER): Payer: No Typology Code available for payment source | Admitting: Anesthesiology

## 2022-05-26 ENCOUNTER — Encounter (HOSPITAL_BASED_OUTPATIENT_CLINIC_OR_DEPARTMENT_OTHER): Payer: Self-pay | Admitting: Orthopaedic Surgery

## 2022-05-26 ENCOUNTER — Encounter (HOSPITAL_BASED_OUTPATIENT_CLINIC_OR_DEPARTMENT_OTHER)
Admission: RE | Disposition: A | Discharge status: Home / Self Care | Payer: Self-pay | Source: Home / Self Care | Attending: Orthopaedic Surgery

## 2022-05-26 ENCOUNTER — Other Ambulatory Visit: Payer: Self-pay

## 2022-05-26 ENCOUNTER — Telehealth: Payer: Self-pay | Admitting: Orthopaedic Surgery

## 2022-05-26 ENCOUNTER — Ambulatory Visit (HOSPITAL_BASED_OUTPATIENT_CLINIC_OR_DEPARTMENT_OTHER)
Admission: RE | Admit: 2022-05-26 | Discharge: 2022-05-26 | Disposition: A | Discharge status: Home / Self Care | Payer: No Typology Code available for payment source | Attending: Orthopaedic Surgery | Admitting: Orthopaedic Surgery

## 2022-05-26 DIAGNOSIS — R7303 Prediabetes: Secondary | ICD-10-CM

## 2022-05-26 DIAGNOSIS — M67431 Ganglion, right wrist: Secondary | ICD-10-CM

## 2022-05-26 DIAGNOSIS — Z7984 Long term (current) use of oral hypoglycemic drugs: Secondary | ICD-10-CM | POA: Insufficient documentation

## 2022-05-26 DIAGNOSIS — Z01818 Encounter for other preprocedural examination: Secondary | ICD-10-CM

## 2022-05-26 DIAGNOSIS — E119 Type 2 diabetes mellitus without complications: Secondary | ICD-10-CM | POA: Insufficient documentation

## 2022-05-26 HISTORY — PX: GANGLION CYST EXCISION: SHX1691

## 2022-05-26 LAB — GLUCOSE, CAPILLARY
Glucose-Capillary: 112 mg/dL — ABNORMAL HIGH (ref 70–99)
Glucose-Capillary: 102 mg/dL — ABNORMAL HIGH (ref 70–99)

## 2022-05-26 LAB — POCT PREGNANCY, URINE: Preg Test, Ur: NEGATIVE

## 2022-05-26 SURGERY — EXCISION, GANGLION CYST, WRIST
Anesthesia: General | Site: Wrist | Laterality: Right

## 2022-05-26 MED ORDER — PROPOFOL 10 MG/ML IV BOLUS
INTRAVENOUS | Status: DC | PRN
  Administered 2022-05-26: 150 mg via INTRAVENOUS

## 2022-05-26 MED ORDER — BUPIVACAINE HCL (PF) 0.25 % IJ SOLN
INTRAMUSCULAR | Status: DC | PRN
  Administered 2022-05-26: 2 mL

## 2022-05-26 MED ORDER — CEFAZOLIN SODIUM-DEXTROSE 2-4 GM/100ML-% IV SOLN
INTRAVENOUS | Status: AC
  Filled 2022-05-26: qty 100

## 2022-05-26 MED ORDER — LIDOCAINE 2% (20 MG/ML) 5 ML SYRINGE
INTRAMUSCULAR | Status: DC | PRN
  Administered 2022-05-26: 60 mg via INTRAVENOUS

## 2022-05-26 MED ORDER — CEFAZOLIN SODIUM-DEXTROSE 2-4 GM/100ML-% IV SOLN
2.0000 g | INTRAVENOUS | Status: AC
  Administered 2022-05-26: 2 g via INTRAVENOUS

## 2022-05-26 MED ORDER — PROMETHAZINE HCL 25 MG/ML IJ SOLN: 6.2500 mg | INTRAMUSCULAR | Status: DC | PRN

## 2022-05-26 MED ORDER — FENTANYL CITRATE (PF) 100 MCG/2ML IJ SOLN
INTRAMUSCULAR | Status: AC
  Filled 2022-05-26: qty 2

## 2022-05-26 MED ORDER — LACTATED RINGERS IV SOLN: INTRAVENOUS | Status: DC

## 2022-05-26 MED ORDER — BUPIVACAINE HCL (PF) 0.5 % IJ SOLN
INTRAMUSCULAR | Status: AC
  Filled 2022-05-26: qty 30

## 2022-05-26 MED ORDER — PROPOFOL 500 MG/50ML IV EMUL
INTRAVENOUS | Status: DC | PRN
  Administered 2022-05-26: 25 ug/kg/min via INTRAVENOUS

## 2022-05-26 MED ORDER — OXYCODONE-ACETAMINOPHEN 5-325 MG PO TABS: 1.0000 | ORAL_TABLET | Freq: Every day | ORAL | 0 refills | Status: DC | PRN

## 2022-05-26 MED ORDER — MIDAZOLAM HCL 2 MG/2ML IJ SOLN
INTRAMUSCULAR | Status: AC
  Filled 2022-05-26: qty 2

## 2022-05-26 MED ORDER — BUPIVACAINE-EPINEPHRINE (PF) 0.5% -1:200000 IJ SOLN
INTRAMUSCULAR | Status: AC
  Filled 2022-05-26: qty 30

## 2022-05-26 MED ORDER — FENTANYL CITRATE (PF) 100 MCG/2ML IJ SOLN
25.0000 ug | INTRAMUSCULAR | Status: DC | PRN
  Administered 2022-05-26 (×2): 50 ug via INTRAVENOUS

## 2022-05-26 MED ORDER — LIDOCAINE HCL (PF) 1 % IJ SOLN
INTRAMUSCULAR | Status: AC
  Filled 2022-05-26: qty 30

## 2022-05-26 MED ORDER — ACETAMINOPHEN 500 MG PO TABS
ORAL_TABLET | ORAL | Status: AC
  Filled 2022-05-26: qty 2

## 2022-05-26 MED ORDER — BUPIVACAINE HCL (PF) 0.25 % IJ SOLN
INTRAMUSCULAR | Status: AC
  Filled 2022-05-26: qty 30

## 2022-05-26 MED ORDER — 0.9 % SODIUM CHLORIDE (POUR BTL) OPTIME
TOPICAL | Status: DC | PRN
  Administered 2022-05-26: 500 mL

## 2022-05-26 MED ORDER — ACETAMINOPHEN 500 MG PO TABS
1000.0000 mg | ORAL_TABLET | Freq: Once | ORAL | Status: AC
  Administered 2022-05-26: 1000 mg via ORAL

## 2022-05-26 MED ORDER — MIDAZOLAM HCL 5 MG/5ML IJ SOLN
INTRAMUSCULAR | Status: DC | PRN
  Administered 2022-05-26: 2 mg via INTRAVENOUS

## 2022-05-26 MED ORDER — ONDANSETRON HCL 4 MG/2ML IJ SOLN
INTRAMUSCULAR | Status: DC | PRN
  Administered 2022-05-26: 4 mg via INTRAVENOUS

## 2022-05-26 MED ORDER — FENTANYL CITRATE (PF) 100 MCG/2ML IJ SOLN
INTRAMUSCULAR | Status: DC | PRN
  Administered 2022-05-26 (×3): 25 ug via INTRAVENOUS
  Administered 2022-05-26: 50 ug via INTRAVENOUS

## 2022-05-26 SURGICAL SUPPLY — 55 items
ADH SKN CLS APL DERMABOND .7 (GAUZE/BANDAGES/DRESSINGS)
BAND INSRT 18 STRL LF DISP RB (MISCELLANEOUS) ×1
BAND RUBBER #18 3X1/16 STRL (MISCELLANEOUS) ×1 IMPLANT
BLADE ARTHRO LOK 4 BEAVER (BLADE) IMPLANT
BLADE SURG 15 STRL LF DISP TIS (BLADE) ×2 IMPLANT
BLADE SURG 15 STRL SS (BLADE) ×2
BNDG CMPR 75X21 PLY HI ABS (MISCELLANEOUS) ×2
BNDG CMPR 9X4 STRL LF SNTH (GAUZE/BANDAGES/DRESSINGS) ×1
BNDG COHESIVE 1X5 TAN STRL LF (GAUZE/BANDAGES/DRESSINGS) IMPLANT
BNDG ELASTIC 2X5.8 VLCR STR LF (GAUZE/BANDAGES/DRESSINGS) IMPLANT
BNDG ELASTIC 3X5.8 VLCR STR LF (GAUZE/BANDAGES/DRESSINGS) IMPLANT
BNDG ESMARK 4X9 LF (GAUZE/BANDAGES/DRESSINGS) ×1 IMPLANT
BRUSH SCRUB EZ PLAIN DRY (MISCELLANEOUS) ×1 IMPLANT
CORD BIPOLAR FORCEPS 12FT (ELECTRODE) ×1 IMPLANT
COVER BACK TABLE 60X90IN (DRAPES) ×1 IMPLANT
COVER MAYO STAND STRL (DRAPES) ×1 IMPLANT
CUFF TOURN SGL QUICK 18X4 (TOURNIQUET CUFF) IMPLANT
DERMABOND ADVANCED .7 DNX12 (GAUZE/BANDAGES/DRESSINGS) IMPLANT
DRAPE EXTREMITY T 121X128X90 (DISPOSABLE) ×1 IMPLANT
DRAPE IMP U-DRAPE 54X76 (DRAPES) ×1 IMPLANT
DRAPE SURG 17X23 STRL (DRAPES) ×1 IMPLANT
GAUZE 4X4 16PLY ~~LOC~~+RFID DBL (SPONGE) IMPLANT
GAUZE SPONGE 4X4 12PLY STRL (GAUZE/BANDAGES/DRESSINGS) ×1 IMPLANT
GAUZE STRETCH 2X75IN STRL (MISCELLANEOUS) ×1 IMPLANT
GAUZE XEROFORM 1X8 LF (GAUZE/BANDAGES/DRESSINGS) ×1 IMPLANT
GLOVE BIOGEL PI IND STRL 7.5 (GLOVE) IMPLANT
GLOVE ECLIPSE 7.0 STRL STRAW (GLOVE) ×1 IMPLANT
GLOVE INDICATOR 7.0 STRL GRN (GLOVE) ×1 IMPLANT
GLOVE INDICATOR 7.5 STRL GRN (GLOVE) ×1 IMPLANT
GLOVE SURG SS PI 7.0 STRL IVOR (GLOVE) IMPLANT
GLOVE SURG SYN 7.5  E (GLOVE) ×2
GLOVE SURG SYN 7.5 E (GLOVE) ×2 IMPLANT
GLOVE SURG SYN 7.5 PF PI (GLOVE) ×2 IMPLANT
GOWN STRL REUS W/ TWL LRG LVL3 (GOWN DISPOSABLE) ×1 IMPLANT
GOWN STRL REUS W/ TWL XL LVL3 (GOWN DISPOSABLE) ×2 IMPLANT
GOWN STRL REUS W/TWL LRG LVL3 (GOWN DISPOSABLE) ×1
GOWN STRL REUS W/TWL XL LVL3 (GOWN DISPOSABLE) ×2
GOWN STRL SURGICAL XL XLNG (GOWN DISPOSABLE) ×1 IMPLANT
NDL HYPO 25X1 1.5 SAFETY (NEEDLE) IMPLANT
NEEDLE HYPO 25X1 1.5 SAFETY (NEEDLE) IMPLANT
NS IRRIG 1000ML POUR BTL (IV SOLUTION) IMPLANT
PACK BASIN DAY SURGERY FS (CUSTOM PROCEDURE TRAY) ×1 IMPLANT
SHEET MEDIUM DRAPE 40X70 STRL (DRAPES) ×1 IMPLANT
SPIKE FLUID TRANSFER (MISCELLANEOUS) IMPLANT
STOCKINETTE 4X48 STRL (DRAPES) IMPLANT
SUT ETHILON 2 0 FS 18 (SUTURE) IMPLANT
SUT ETHILON 4 0 PS 2 18 (SUTURE) ×1 IMPLANT
SUT VIC AB 0 CT1 27 (SUTURE)
SUT VIC AB 0 CT1 27XBRD ANBCTR (SUTURE) IMPLANT
SUT VIC AB 2-0 CT1 27 (SUTURE)
SUT VIC AB 2-0 CT1 TAPERPNT 27 (SUTURE) IMPLANT
SYR BULB EAR ULCER 3OZ GRN STR (SYRINGE) IMPLANT
SYR CONTROL 10ML LL (SYRINGE) IMPLANT
TOWEL GREEN STERILE FF (TOWEL DISPOSABLE) ×1 IMPLANT
TRAY DSU PREP LF (CUSTOM PROCEDURE TRAY) ×1 IMPLANT

## 2022-05-26 NOTE — Anesthesia Procedure Notes (Signed)
Procedure Name: LMA Insertion Date/Time: 05/26/2022 12:09 PM  Performed by: Brandonn Capelli, Ernesta Amble, CRNAPre-anesthesia Checklist: Patient identified, Emergency Drugs available, Suction available and Patient being monitored Patient Re-evaluated:Patient Re-evaluated prior to induction Oxygen Delivery Method: Circle system utilized Preoxygenation: Pre-oxygenation with 100% oxygen Induction Type: IV induction Ventilation: Mask ventilation without difficulty LMA: LMA inserted LMA Size: 4.0 Number of attempts: 1 Airway Equipment and Method: Bite block Placement Confirmation: positive ETCO2 Tube secured with: Tape Dental Injury: Teeth and Oropharynx as per pre-operative assessment

## 2022-05-26 NOTE — H&P (Signed)
PREOPERATIVE H&P  Chief Complaint: right wrist ganglion cyst volar  HPI: [name] is a 42 y.o. female who presents for surgical treatment of right wrist ganglion cyst volar.  She denies any changes in medical history.  Past Medical History:  Diagnosis Date   Abdominal pain 03/29/2021   Allergic reaction 06/07/2021   Angio-edema    ANXIETY 12/31/2008   Qualifier: Diagnosis of  By: Nadara Eaton  MD, Mickel Baas     Breast mass in female 70/35/0093   Complication of anesthesia    Problem waking up once and a headache   Condyloma acuminatum 07/21/2006   Qualifier: History of  By: Oneal Grout MD, Manuela Schwartz     Depression    Diabetes mellitus without complication ([place])    Eczema    Family history of anesthesia complication    Mother had problem waking up   Hx gestational diabetes    Laryngitis 02/15/2020   Menorrhagia 07/03/2014   Molluscum contagiosum infection 12/01/2018   Muscle strain of lower leg, right, initial encounter 03/05/2019   Pain in right wrist 11/03/2021   Spinal headache    Thyroid fullness 04/02/2014   Upper respiratory tract infection 04/20/2022   Urticaria    Vaginal lesion 11/13/2020   Voice hoarseness 05/22/2020   Past Surgical History:  Procedure Laterality Date   CESAREAN SECTION     CHOLECYSTECTOMY N/A 09/06/2013   Procedure: LAPAROSCOPIC CHOLECYSTECTOMY;  Surgeon: Harl Bowie, MD;  Location: [facility];  Service: General;  Laterality: N/A;   GANGLION CYST EXCISION Right 08/03/2021   Procedure: RIGHT REMOVAL GANGLION OF WRIST;  Surgeon: Sherilyn Cooter, MD;  Location: [facility];  Service: Orthopedics;  Laterality: Right;  regional with monitored anesthesia care   NASAL SINUS SURGERY  2003   TUBAL LIGATION     Social History   Socioeconomic History   Marital status: Married    Spouse name: Not on file   Number of children: Not on file   Years of education: Not on file   Highest education level: Not on file  Occupational History    Not on file  Tobacco Use   Smoking status: Never    Passive exposure: Never   Smokeless tobacco: Never  Vaping Use   Vaping Use: Never used  Substance and Sexual Activity   Alcohol use: Yes    Comment: occ.   Drug use: No   Sexual activity: Yes    Birth control/protection: Surgical  Other Topics Concern   Not on file  Social History Narrative   Not on file   Social Determinants of Health   Financial Resource Strain: Not on file  Food Insecurity: No Food Insecurity (11/13/2020)   Hunger Vital Sign    Worried About Running Out of Food in the Last Year: Never true    Ran Out of Food in the Last Year: Never true  Transportation Needs: No Transportation Needs (11/13/2020)   PRAPARE - Hydrologist (Medical): No    Lack of Transportation (Non-Medical): No  Physical Activity: Not on file  Stress: Not on file  Social Connections: Not on file   Family History  Problem Relation Age of Onset   Hypertension Mother    Hyperlipidemia Mother    Cervical cancer Mother    Diabetes Father    Hypertension Father    Irregular heart beat Father    Breast cancer Paternal Aunt    Throat cancer Maternal Grandmother    Prostate  cancer Maternal Grandfather    Diabetes Other    Allergic rhinitis Daughter    Allergic rhinitis Daughter    Allergic rhinitis Son        milk   Stomach cancer Neg Hx    Colon cancer Neg Hx    Esophageal cancer Neg Hx    Pancreatic cancer Neg Hx    Allergies  Allergen Reactions   Amoxicillin Hives and Swelling   Penicillins Hives and Swelling    Pt received Ceftriaxone in 2014 without rxn. Allergy updated by S. Redmond Pulling, PharmD (05/25/22)   Shellfish Allergy Hives   Prior to Admission medications   Medication Sig Start Date End Date Taking? Authorizing Provider  cetirizine (ZYRTEC) 10 MG tablet Take 1 tablet (10 mg total) by mouth daily. 06/05/21  Yes Lattie Haw, MD  fluticasone (FLONASE) 50 MCG/ACT nasal spray Place 2 sprays into  both nostrils daily. 06/18/21  Yes Padgett, Rae Halsted, MD  levocetirizine (XYZAL) 5 MG tablet Take 1 tablet (5 mg total) by mouth in the morning and at bedtime. 06/18/21  Yes Kennith Gain, MD  metFORMIN (GLUCOPHAGE-XR) 500 MG 24 hr tablet Take 1 tablet (500 mg total) by mouth daily with breakfast. 01/27/21  Yes Ezequiel Essex, MD  ketorolac (ACULAR) 0.5 % ophthalmic solution 1 drop 2 (two) times daily. 10/14/20   [provider]  Olopatadine HCl (PATADAY) 0.2 % SOLN Place 1 drop into both eyes 2 (two) times daily as needed. Patient not taking: Reported on 11/17/2021 06/18/21   Kennith Gain, MD  omeprazole (PRILOSEC) 20 MG capsule Take 20 mg by mouth daily.    [provider]     Positive ROS: All other systems have been reviewed and were otherwise negative with the exception of those mentioned in the HPI and as above.  Physical Exam: General: Alert, no acute distress Cardiovascular: No pedal edema Respiratory: No cyanosis, no use of accessory musculature GI: abdomen soft Skin: No lesions in the area of chief complaint Neurologic: Sensation intact distally Psychiatric: Patient is competent for consent with normal mood and affect Lymphatic: no lymphedema  MUSCULOSKELETAL: exam stable  Assessment: right wrist ganglion cyst volar  Plan: Plan for Procedure(s): RIGHT WRIST VOLAR GANGLION CYST REMOVAL  The risks benefits and alternatives were discussed with the patient including but not limited to the risks of nonoperative treatment, versus surgical intervention including infection, bleeding, nerve injury,  blood clots, cardiopulmonary complications, morbidity, mortality, among others, and they were willing to proceed.   Eduard Roux, MD 05/26/2022 11:38 AM

## 2022-05-26 NOTE — Anesthesia Preprocedure Evaluation (Addendum)
Anesthesia Evaluation  Patient identified by MRN, date of birth, ID band Patient awake    Reviewed: Allergy & Precautions, NPO status , Patient's Chart, lab work & pertinent test results  History of Anesthesia Complications (+) POST - OP SPINAL HEADACHE, Family history of anesthesia reaction and history of anesthetic complications  Airway Mallampati: II  TM Distance: >3 FB Neck ROM: Full    Dental  (+) Teeth Intact, Dental Advisory Given, Caps,    Pulmonary neg pulmonary ROS   Pulmonary exam normal breath sounds clear to auscultation       Cardiovascular Exercise Tolerance: Good negative cardio ROS Normal cardiovascular exam Rhythm:Regular Rate:Normal     Neuro/Psych  Headaches PSYCHIATRIC DISORDERS Anxiety Depression       GI/Hepatic negative GI ROS, Neg liver ROS,,,  Endo/Other  diabetes, Type 2, Oral Hypoglycemic Agents    Renal/GU negative Renal ROS     Musculoskeletal  (+) Arthritis ,  right wrist ganglion cyst volar   Abdominal   Peds  Hematology negative hematology ROS (+)   Anesthesia Other Findings Day of surgery medications reviewed with the patient.  Reproductive/Obstetrics                             Anesthesia Physical Anesthesia Plan  ASA: 2  Anesthesia Plan: General   Post-op Pain Management: Tylenol PO (pre-op)* and Toradol IV (intra-op)*   Induction: Intravenous  PONV Risk Score and Plan: 3 and Midazolam, TIVA, Dexamethasone and Ondansetron  Airway Management Planned: LMA  Additional Equipment:   Intra-op Plan:   Post-operative Plan: Extubation in OR  Informed Consent: I have reviewed the patients History and Physical, chart, labs and discussed the procedure including the risks, benefits and alternatives for the proposed anesthesia with the patient or authorized representative who has indicated his/her understanding and acceptance.     Dental advisory  given  Plan Discussed with: CRNA and Anesthesiologist  Anesthesia Plan Comments:        Anesthesia Quick Evaluation

## 2022-05-26 NOTE — Op Note (Signed)
   Date of Surgery: 05/26/2022  INDICATIONS: [name] is a 42 y.o.-year-old female with a right volar wrist ganglion cyst that failed conservative treatment.  The patient did consent to the procedure after discussion of the risks and benefits.  PREOPERATIVE DIAGNOSIS: Right volar wrist ganglion cyst  POSTOPERATIVE DIAGNOSIS: Same.  PROCEDURE: Excision right volar wrist ganglion cyst  SURGEON: N. Eduard Roux, M.D.  ASSIST: Ciro Backer [place], [place]; necessary for the timely completion of procedure and due to complexity of procedure.  ANESTHESIA: General  IV FLUIDS AND URINE: See anesthesia.  ESTIMATED BLOOD LOSS: Minimal mL.  IMPLANTS: None  DRAINS: None  COMPLICATIONS: see description of procedure.  DESCRIPTION OF PROCEDURE: The patient was brought to the operating room.  The patient had been signed prior to the procedure and this was documented. The patient had the anesthesia placed by the anesthesiologist.  A time-out was performed to confirm that this was the correct patient, site, side and location. The patient did receive antibiotics prior to the incision and was re-dosed during the procedure as needed at indicated intervals.  A tourniquet was placed on the upper arm.  The patient had the operative extremity prepped and draped in the standard surgical fashion.    A longitudinal incision was created over the palpable cyst.  Blunt dissection was carried out with tenotomy scissors through the subcutaneous tissue.  The cyst was immediately encountered in the subcutaneous space.  This measured slightly less than a centimeter wide and irregularly shaped in terms of length which was by a centimeter and a half.  This was not firmly adhered to the radial artery.  The cyst was mobilized off of the radial artery carefully.  I was not able to really identify a discrete stock.  The cyst did appear to track deep into the wrist joint near the radial artery however given the proximity to the radial  artery I did not feel comfortable aggressively exploring the area for the stock.  The cyst was punctured and there was typical ganglion cyst fluid that was expressed.  The cyst was removed uneventfully and sent to pathology.  The tourniquet was then deflated.  There was a strong pulse to radial artery without any evidence of injury.  Small venous bleeders were cauterized with bipolar.  This was thoroughly irrigated and closed with 4-0 nylon suture.  Soft sterile dressings applied.  Patient tolerated procedure well had no many complications.  Tawanna Cooler was necessary for opening, closing, retracting, limb positioning and overall facilitation and timely completion of the procedure.  POSTOPERATIVE PLAN: Discharge home.  Light activity to the right wrist.  Follow-up in 2 weeks for suture removal.  N. Eduard Roux, MD 1:49 PM

## 2022-05-26 NOTE — Telephone Encounter (Signed)
Call patient daughter Raquel Sarna at [phone]   [phone]

## 2022-05-26 NOTE — Discharge Instructions (Addendum)
Postoperative instructions:  Weightbearing instructions: as tolerated  Keep your dressing and/or splint clean and dry at all times.  You can remove your dressing on post-operative day #3 and change with a dry/sterile dressing or Band-Aids as needed thereafter.    Incision instructions:  Do not soak your incision for 3 weeks after surgery.  If the incision gets wet, pat dry and do not scrub the incision.  Pain control:  You have been given a prescription to be taken as directed for post-operative pain control.  In addition, elevate the operative extremity above the heart at all times to prevent swelling and throbbing pain.  Take over-the-counter Colace, '100mg'$  by mouth twice a day while taking narcotic pain medications to help prevent constipation.  Follow up appointments: 1) 14 days for suture removal and wound check. 2) Dr. Erlinda Hong as scheduled.   -------------------------------------------------------------------------------------------------------------  After Surgery Pain Control:  After your surgery, post-surgical discomfort or pain is likely. This discomfort can last several days to a few weeks. At certain times of the day your discomfort may be more intense.  Did you receive a nerve block?  A nerve block can provide pain relief for one hour to two days after your surgery. As long as the nerve block is working, you will experience little or no sensation in the area the surgeon operated on.  As the nerve block wears off, you will begin to experience pain or discomfort. It is very important that you begin taking your prescribed pain medication before the nerve block fully wears off. Treating your pain at the first sign of the block wearing off will ensure your pain is better controlled and more tolerable when full-sensation returns. Do not wait until the pain is intolerable, as the medicine will be less effective. It is better to treat pain in advance than to try and catch up.  General  Anesthesia:  If you did not receive a nerve block during your surgery, you will need to start taking your pain medication shortly after your surgery and should continue to do so as prescribed by your surgeon.  Pain Medication:  Most commonly we prescribe Vicodin and Percocet for post-operative pain. Both of these medications contain a combination of acetaminophen (Tylenol) and a narcotic to help control pain.   It takes between 30 and 45 minutes before pain medication starts to work. It is important to take your medication before your pain level gets too intense.   Nausea is a common side effect of many pain medications. You will want to eat something before taking your pain medicine to help prevent nausea.   If you are taking a prescription pain medication that contains acetaminophen, we recommend that you do not take additional over the counter acetaminophen (Tylenol).  Other pain relieving options:   Using a cold pack to ice the affected area a few times a day (15 to 20 minutes at a time) can help to relieve pain, reduce swelling and bruising.   Elevation of the affected area can also help to reduce pain and swelling.   Post Anesthesia Home Care Instructions  Activity: Get plenty of rest for the remainder of the day. A responsible individual must stay with you for 24 hours following the procedure.  For the next 24 hours, DO NOT: -Drive a car -Paediatric nurse -Drink alcoholic beverages -Take any medication unless instructed by your physician -Make any legal decisions or sign important papers.  Meals: Start with liquid foods such as gelatin or soup.  Progress to regular foods as tolerated. Avoid greasy, spicy, heavy foods. If nausea and/or vomiting occur, drink only clear liquids until the nausea and/or vomiting subsides. Call your physician if vomiting continues.  Special Instructions/Symptoms: Your throat may feel dry or sore from the anesthesia or the breathing tube placed in  your throat during surgery. If this causes discomfort, gargle with warm salt water. The discomfort should disappear within 24 hours.  If you had a scopolamine patch placed behind your ear for the management of post- operative nausea and/or vomiting:  1. The medication in the patch is effective for 72 hours, after which it should be removed.  Wrap patch in a tissue and discard in the trash. Wash hands thoroughly with soap and water. 2. You may remove the patch earlier than 72 hours if you experience unpleasant side effects which may include dry mouth, dizziness or visual disturbances. 3. Avoid touching the patch. Wash your hands with soap and water after contact with the patch.  *May take Tylenol today at 5pm

## 2022-05-26 NOTE — Telephone Encounter (Signed)
Patient daughter came in stating Erlinda Hong would give her 2 week out of work post surgery and patient needs note daughter is currently here in office

## 2022-05-26 NOTE — Transfer of Care (Signed)
Immediate Anesthesia Transfer of Care Note  Patient: [name]  Procedure(s) Performed: RIGHT WRIST VOLAR GANGLION CYST REMOVAL (Right: Wrist)  Patient Location: PACU  Anesthesia Type:General  Level of Consciousness: drowsy and patient cooperative  Airway & Oxygen Therapy: Patient Spontanous Breathing  Post-op Assessment: Report given to RN and Post -op Vital signs reviewed and stable  Post vital signs: Reviewed and stable  Last Vitals:  Vitals Value Taken Time  BP 97/55 05/26/22 1254  Temp    Pulse 74 05/26/22 1256  Resp 15 05/26/22 1256  SpO2 98 % 05/26/22 1256  Vitals shown include unvalidated device data.  Last Pain:  Vitals:   05/26/22 1102  TempSrc: Oral  PainSc: 0-No pain         Complications: No notable events documented.

## 2022-05-27 ENCOUNTER — Telehealth: Payer: Self-pay | Admitting: Orthopaedic Surgery

## 2022-05-27 ENCOUNTER — Encounter (HOSPITAL_BASED_OUTPATIENT_CLINIC_OR_DEPARTMENT_OTHER): Payer: Self-pay | Admitting: Orthopaedic Surgery

## 2022-05-27 LAB — SURGICAL PATHOLOGY

## 2022-05-27 NOTE — Telephone Encounter (Signed)
She can return to work as long as she wears a velcro splint at work. Thanks.

## 2022-05-27 NOTE — Anesthesia Postprocedure Evaluation (Signed)
Anesthesia Post Note  Patient: [name]  Procedure(s) Performed: RIGHT WRIST VOLAR GANGLION CYST REMOVAL (Right: Wrist)     Patient location during evaluation: PACU Anesthesia Type: General Level of consciousness: awake and alert Pain management: pain level controlled Vital Signs Assessment: post-procedure vital signs reviewed and stable Respiratory status: spontaneous breathing, nonlabored ventilation, respiratory function stable and patient connected to nasal cannula oxygen Cardiovascular status: blood pressure returned to baseline and stable Postop Assessment: no apparent nausea or vomiting Anesthetic complications: no   No notable events documented.  Last Vitals:  Vitals:   05/26/22 1340 05/26/22 1407  BP:  131/77  Pulse: 78 77  Resp: 12 14  Temp:  (!) 36.3 C  SpO2: 97% 99%    Last Pain:  Vitals:   05/26/22 1407  TempSrc: Oral  PainSc: 4                  Esaias Cleavenger L Li Fragoso

## 2022-05-27 NOTE — Telephone Encounter (Signed)
Called patient. No answer. LMOM that letter has been created and sent to her My Chart account.

## 2022-05-27 NOTE — Telephone Encounter (Signed)
Patient states she needs a work note to return to work, she is asking if she can get it through my chart or does she have to come and get it?..please advise..[phone]

## 2022-06-08 ENCOUNTER — Ambulatory Visit (INDEPENDENT_AMBULATORY_CARE_PROVIDER_SITE_OTHER): Payer: Self-pay | Admitting: Physician Assistant

## 2022-06-08 DIAGNOSIS — M67431 Ganglion, right wrist: Secondary | ICD-10-CM

## 2022-06-08 MED ORDER — METHOCARBAMOL 750 MG PO TABS: 750.0000 mg | ORAL_TABLET | Freq: Two times a day (BID) | ORAL | 0 refills | Status: DC | PRN

## 2022-06-08 NOTE — Progress Notes (Signed)
Post-Op Visit Note   Patient: [name]           Date of Birth: 1980/08/05           MRN: [id number] Visit Date: 06/08/2022 PCP: Ezequiel Essex, MD   Assessment & Plan:  Chief Complaint:  Chief Complaint  Patient presents with   Right Wrist - Routine Post Op   Visit Diagnoses:  1. Ganglion cyst of dorsum of right wrist     Plan: Patient is a pleasant 42 year old female who comes in today nearly 2 weeks status post right wrist ganglion cyst excision 05/26/2022.  Pathology came back consistent with ganglion cyst.  Patient was doing okay initially but then began having pain over the past week as she started to increase activity such as cooking.  She has noticed increased pain and swelling.  She has been taking Tylenol.  Examination of her right hand reveals a fully healed surgical incision with nylon sutures in place.  No evidence of infection or cellulitis.  Fingers warm well-perfused.  She is neurovascular intact distally.  At this point, sutures were removed and Steri-Strips applied.  Will keep her out of work for another 2 weeks as she is a Freight forwarder at [employer].  Follow-up in 2 weeks for recheck.  Call with concerns or questions.  Follow-Up Instructions: Return in about 2 weeks (around 06/22/2022).   Orders:  No orders of the defined types were placed in this encounter.  Meds ordered this encounter  Medications   methocarbamol (ROBAXIN-750) 750 MG tablet    Sig: Take 1 tablet (750 mg total) by mouth 2 (two) times daily as needed for muscle spasms.    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   Coalition, 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   Hypochondriasis 08/19/2014   Cystic thyroid nodule 07/03/2014   Prediabetes 04/02/2014   Seasonal allergies 10/24/2012   Multiple food allergies 10/24/2012   Obesity, unspecified 07/23/2012   Fatty liver disease, nonalcoholic 03/54/6568   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   Past Medical History:  Diagnosis Date   Abdominal pain 03/29/2021   Allergic reaction 06/07/2021   Angio-edema    ANXIETY 12/31/2008   Qualifier: Diagnosis of  By: Nadara Eaton  MD, Mickel Baas     Breast mass in female 12/75/1700   Complication of anesthesia    Problem waking up once and a headache   Condyloma acuminatum 07/21/2006   Qualifier: History of  By: Oneal Grout MD, Manuela Schwartz     Depression    Diabetes mellitus without complication ([place])    Eczema    Family history of anesthesia complication    Mother had problem waking up   Hx gestational diabetes    Laryngitis 02/15/2020   Menorrhagia 07/03/2014   Molluscum contagiosum infection 12/01/2018   Muscle strain of lower leg, right, initial encounter 03/05/2019   Pain in right wrist 11/03/2021   Spinal headache  Thyroid fullness 04/02/2014   Upper respiratory tract infection 04/20/2022   Urticaria    Vaginal lesion 11/13/2020   Voice hoarseness 05/22/2020    Family History  Problem Relation Age of Onset   Hypertension Mother    Hyperlipidemia Mother    Cervical cancer Mother    Diabetes Father    Hypertension Father    Irregular heart beat Father    Breast cancer Paternal Aunt    Throat cancer Maternal Grandmother    Prostate cancer Maternal Grandfather    Diabetes Other    Allergic rhinitis Daughter    Allergic rhinitis Daughter    Allergic rhinitis Son        milk   Stomach cancer Neg Hx    Colon cancer Neg Hx    Esophageal cancer  Neg Hx    Pancreatic cancer Neg Hx     Past Surgical History:  Procedure Laterality Date   CESAREAN SECTION     CHOLECYSTECTOMY N/A 09/06/2013   Procedure: LAPAROSCOPIC CHOLECYSTECTOMY;  Surgeon: Harl Bowie, MD;  Location: [facility];  Service: General;  Laterality: N/A;   GANGLION CYST EXCISION Right 08/03/2021   Procedure: RIGHT REMOVAL GANGLION OF WRIST;  Surgeon: Sherilyn Cooter, MD;  Location: [facility];  Service: Orthopedics;  Laterality: Right;  regional with monitored anesthesia care   GANGLION CYST EXCISION Right 05/26/2022   Procedure: RIGHT WRIST VOLAR GANGLION CYST REMOVAL;  Surgeon: Leandrew Koyanagi, MD;  Location: [facility];  Service: Orthopedics;  Laterality: Right;   NASAL SINUS SURGERY  2003   TUBAL LIGATION     Social History   Occupational History   Not on file  Tobacco Use   Smoking status: Never    Passive exposure: Never   Smokeless tobacco: Never  Vaping Use   Vaping Use: Never used  Substance and Sexual Activity   Alcohol use: Yes    Comment: occ.   Drug use: No   Sexual activity: Yes    Birth control/protection: Surgical

## 2022-06-11 ENCOUNTER — Telehealth: Payer: Self-pay | Admitting: Orthopaedic Surgery

## 2022-06-11 NOTE — Telephone Encounter (Signed)
Tried to call patient back. No answer. I called two weeks about her work note, which I also sent to her MyChart.

## 2022-06-11 NOTE — Telephone Encounter (Signed)
Pt states she is returning a call to Dr Erlinda Hong nurse. Please call pt at [phone].

## 2022-06-12 ENCOUNTER — Other Ambulatory Visit: Payer: Self-pay

## 2022-06-12 ENCOUNTER — Emergency Department (HOSPITAL_COMMUNITY): Payer: No Typology Code available for payment source

## 2022-06-12 ENCOUNTER — Emergency Department (HOSPITAL_COMMUNITY)
Admission: EM | Admit: 2022-06-12 | Discharge: 2022-06-13 | Disposition: A | Discharge status: Home / Self Care | Payer: No Typology Code available for payment source | Attending: Emergency Medicine | Admitting: Emergency Medicine

## 2022-06-12 ENCOUNTER — Ambulatory Visit (HOSPITAL_COMMUNITY)
Admission: EM | Admit: 2022-06-12 | Discharge: 2022-06-12 | Disposition: A | Discharge status: Home / Self Care | Payer: No Typology Code available for payment source

## 2022-06-12 ENCOUNTER — Encounter (HOSPITAL_COMMUNITY): Payer: Self-pay | Admitting: *Deleted

## 2022-06-12 ENCOUNTER — Encounter (HOSPITAL_COMMUNITY): Payer: Self-pay | Admitting: Emergency Medicine

## 2022-06-12 DIAGNOSIS — E119 Type 2 diabetes mellitus without complications: Secondary | ICD-10-CM | POA: Insufficient documentation

## 2022-06-12 DIAGNOSIS — R519 Headache, unspecified: Secondary | ICD-10-CM | POA: Insufficient documentation

## 2022-06-12 DIAGNOSIS — Z7984 Long term (current) use of oral hypoglycemic drugs: Secondary | ICD-10-CM | POA: Insufficient documentation

## 2022-06-12 DIAGNOSIS — M436 Torticollis: Secondary | ICD-10-CM | POA: Insufficient documentation

## 2022-06-12 DIAGNOSIS — Z1152 Encounter for screening for COVID-19: Secondary | ICD-10-CM | POA: Insufficient documentation

## 2022-06-12 DIAGNOSIS — R531 Weakness: Secondary | ICD-10-CM

## 2022-06-12 LAB — RESP PANEL BY RT-PCR (RSV, FLU A&B, COVID)  RVPGX2
Influenza A by PCR: NEGATIVE
Influenza B by PCR: NEGATIVE
Resp Syncytial Virus by PCR: NEGATIVE
SARS Coronavirus 2 by RT PCR: NEGATIVE

## 2022-06-12 LAB — BASIC METABOLIC PANEL
Anion gap: 9 (ref 5–15)
BUN: 8 mg/dL (ref 6–20)
CO2: 23 mmol/L (ref 22–32)
Calcium: 9.3 mg/dL (ref 8.9–10.3)
Chloride: 104 mmol/L (ref 98–111)
Creatinine, Ser: 0.59 mg/dL (ref 0.44–1.00)
GFR, Estimated: >60 mL/min (ref >60)
Glucose, Bld: 128 mg/dL — ABNORMAL HIGH (ref 70–99)
Potassium: 3.6 mmol/L (ref 3.5–5.1)
Sodium: 136 mmol/L (ref 135–145)

## 2022-06-12 LAB — CBC WITH DIFFERENTIAL/PLATELET
Abs Immature Granulocytes: 0.02 10*3/uL (ref 0.00–0.07)
Basophils Absolute: 0.1 10*3/uL (ref 0.0–0.1)
Basophils Relative: 1 %
Eosinophils Absolute: 0.1 10*3/uL (ref 0.0–0.5)
Eosinophils Relative: 1 %
HCT: 41.4 % (ref 36.0–46.0)
Hemoglobin: 13.7 g/dL (ref 12.0–15.0)
Immature Granulocytes: 0 %
Lymphocytes Relative: 19 %
Lymphs Abs: 2.3 10*3/uL (ref 0.7–4.0)
MCH: 31.5 pg (ref 26.0–34.0)
MCHC: 33.1 g/dL (ref 30.0–36.0)
MCV: 95.2 fL (ref 80.0–100.0)
Monocytes Absolute: 0.8 10*3/uL (ref 0.1–1.0)
Monocytes Relative: 6 %
Neutro Abs: 8.9 10*3/uL — ABNORMAL HIGH (ref 1.7–7.7)
Neutrophils Relative %: 73 %
Platelets: 315 10*3/uL (ref 150–400)
RBC: 4.35 MIL/uL (ref 3.87–5.11)
RDW: 13 % (ref 11.5–15.5)
WBC: 12.2 10*3/uL — ABNORMAL HIGH (ref 4.0–10.5)
nRBC: 0 % (ref 0.0–0.2)

## 2022-06-12 LAB — I-STAT BETA HCG BLOOD, ED (MC, WL, AP ONLY): I-stat hCG, quantitative: <5 m[IU]/mL (ref <5)

## 2022-06-12 MED ORDER — METOCLOPRAMIDE HCL 5 MG/ML IJ SOLN
10.0000 mg | Freq: Once | INTRAMUSCULAR | Status: AC
  Administered 2022-06-12: 10 mg via INTRAVENOUS
  Filled 2022-06-12: qty 2

## 2022-06-12 MED ORDER — DIPHENHYDRAMINE HCL 50 MG/ML IJ SOLN
25.0000 mg | Freq: Once | INTRAMUSCULAR | Status: AC
  Administered 2022-06-12: 25 mg via INTRAVENOUS
  Filled 2022-06-12: qty 1

## 2022-06-12 MED ORDER — MORPHINE SULFATE (PF) 4 MG/ML IV SOLN
4.0000 mg | Freq: Once | INTRAVENOUS | Status: AC
  Administered 2022-06-12: 4 mg via INTRAVENOUS
  Filled 2022-06-12: qty 1

## 2022-06-12 MED ORDER — DIAZEPAM 5 MG/ML IJ SOLN
5.0000 mg | Freq: Once | INTRAMUSCULAR | Status: AC
  Administered 2022-06-12: 5 mg via INTRAVENOUS
  Filled 2022-06-12: qty 2

## 2022-06-12 MED ORDER — KETOROLAC TROMETHAMINE 30 MG/ML IJ SOLN
30.0000 mg | Freq: Once | INTRAMUSCULAR | Status: AC
  Administered 2022-06-12: 30 mg via INTRAVENOUS
  Filled 2022-06-12: qty 1

## 2022-06-12 MED ORDER — PREDNISONE 20 MG PO TABS: ORAL_TABLET | ORAL | 0 refills | Status: DC

## 2022-06-12 MED ORDER — METHYLPREDNISOLONE SODIUM SUCC 125 MG IJ SOLR
125.0000 mg | Freq: Once | INTRAMUSCULAR | Status: AC
  Administered 2022-06-12: 125 mg via INTRAVENOUS
  Filled 2022-06-12: qty 2

## 2022-06-12 MED ORDER — IBUPROFEN 800 MG PO TABS: 800.0000 mg | ORAL_TABLET | Freq: Three times a day (TID) | ORAL | 0 refills | Status: DC

## 2022-06-12 MED ORDER — OXYCODONE-ACETAMINOPHEN 5-325 MG PO TABS: 1.0000 | ORAL_TABLET | Freq: Four times a day (QID) | ORAL | 0 refills | Status: DC | PRN

## 2022-06-12 MED ORDER — CYCLOBENZAPRINE HCL 5 MG PO TABS: 5.0000 mg | ORAL_TABLET | Freq: Three times a day (TID) | ORAL | 0 refills | Status: DC | PRN

## 2022-06-12 NOTE — Discharge Instructions (Signed)
Take Motrin 800 mg every 6 hours.  Take Flexeril 5 mg every 8 hours as needed  Take Percocet only if you have severe pain  Take prednisone as prescribed  Please use the soft collar for comfort  Follow-up with your primary care doctor  Return to ER if you have worse neck pain or headache

## 2022-06-12 NOTE — ED Provider Triage Note (Signed)
Emergency Medicine Provider Triage Evaluation Note  [name] , a 42 y.o. female  was evaluated in triage.  Pt complains of headache and left-sided neck pain for the last 3 days.  Seen in urgent care, sent to ED for further evaluation.  Pain radiates on the left side of her neck, she feels weak all over.  No loss of consciousness, vision changes, lateralized weakness or numbness..  Review of Systems  Per HPI  Physical Exam  LMP 04/23/2022  Gen:   Awake, no distress   Resp:  Normal effort  MSK:   Moves extremities without difficulty  Other:  Reproducible tenderness to the left posterior scalp.  Cranial nerves II through XII are grossly intact, grip strength is symmetric bilaterally with a slightly reduced, ambulatory with a steady gait.  Medical Decision Making  Medically screening exam initiated at 6:25 PM.  Appropriate orders placed.  KERRY ODONOHUE was informed that the remainder of the evaluation will be completed by another provider, this initial triage assessment does not replace that evaluation, and the importance of remaining in the ED until their evaluation is complete.     Sherrill Raring, PA-C 06/12/22 1827

## 2022-06-12 NOTE — ED Provider Notes (Signed)
Between   MRN: [id number] DOB: November 21, 1980  Subjective:   [name] is a 42 y.o. female presenting for three day history of steadily worsening posterior left-sided head pain, neck pain, facial pain and tingling, generalized weakness.  Denies any fever or chills.  Denies any recent sickness.  No nausea or vomiting.  Patient reports that it has been very difficult for her to eat because she cannot open her mouth very wide.  Patient states that she woke up 3 days ago and thought she had just slept on her neck wrong; she took 1 Percocet that day, but none since then.  She has been taking Tylenol and using heat to the area.  Patient reports that over the last 3 days pain continues to worsen and seems mostly isolated in the left side of the back of her head.  Feels like a pressure pain with occasional feeling of someone stabbing and slicing her head open.   Of note, patient had right ganglion cyst removal on 05/26/2022. I asked her about any trouble with anesthesia in the past, and she reports that when she had her ankle surgery done years ago, she had a similar sensation in the back of her head, but this was immediately after the surgery during recovery for a few hours; but she never had any complications like this weeks later.  Her blood pressure is also elevated, which she states is not normal for her.  Someone else drove her to the urgent care today and she reports that she was screaming in the vehicle because every minor bump made the pain extremely worse and also made her dizzy.  No current facility-administered medications for this encounter.  Current Outpatient Medications:    cetirizine (ZYRTEC) 10 MG tablet, Take 1 tablet (10 mg total) by mouth daily., Disp: 30 tablet, Rfl: 11   fluticasone (FLONASE) 50 MCG/ACT nasal spray, Place 2 sprays into both nostrils daily., Disp: 16 g, Rfl: 5   ketorolac (ACULAR) 0.5 % ophthalmic solution, 1 drop 2 (two) times daily., Disp: ,  Rfl:    levocetirizine (XYZAL) 5 MG tablet, Take 1 tablet (5 mg total) by mouth in the morning and at bedtime., Disp: 60 tablet, Rfl: 5   metFORMIN (GLUCOPHAGE-XR) 500 MG 24 hr tablet, Take 1 tablet (500 mg total) by mouth daily with breakfast., Disp: 90 tablet, Rfl: 3   methocarbamol (ROBAXIN-750) 750 MG tablet, Take 1 tablet (750 mg total) by mouth 2 (two) times daily as needed for muscle spasms., Disp: 20 tablet, Rfl: 0   Olopatadine HCl (PATADAY) 0.2 % SOLN, Place 1 drop into both eyes 2 (two) times daily as needed. (Patient not taking: Reported on 11/17/2021), Disp: 2.5 mL, Rfl: 5   omeprazole (PRILOSEC) 20 MG capsule, Take 20 mg by mouth daily., Disp: , Rfl:    oxyCODONE-acetaminophen (PERCOCET) 5-325 MG tablet, Take 1-2 tablets by mouth daily as needed for severe pain., Disp: 8 tablet, Rfl: 0   Allergies  Allergen Reactions   Amoxicillin Hives and Swelling   Penicillins Hives and Swelling    Pt received Ceftriaxone in 2014 without rxn. Allergy updated by S. Redmond Pulling, PharmD (05/25/22)   Shellfish Allergy Hives    Past Medical History:  Diagnosis Date   Abdominal pain 03/29/2021   Allergic reaction 06/07/2021   Angio-edema    ANXIETY 12/31/2008   Qualifier: Diagnosis of  By: Nadara Eaton  MD, Mickel Baas     Breast mass in female 02/77/4128   Complication  of anesthesia    Problem waking up once and a headache   Condyloma acuminatum 07/21/2006   Qualifier: History of  By: Oneal Grout MD, Manuela Schwartz     Depression    Diabetes mellitus without complication ([place])    Eczema    Family history of anesthesia complication    Mother had problem waking up   Hx gestational diabetes    Laryngitis 02/15/2020   Menorrhagia 07/03/2014   Molluscum contagiosum infection 12/01/2018   Muscle strain of lower leg, right, initial encounter 03/05/2019   Pain in right wrist 11/03/2021   Spinal headache    Thyroid fullness 04/02/2014   Upper respiratory tract infection 04/20/2022   Urticaria    Vaginal lesion  11/13/2020   Voice hoarseness 05/22/2020     Past Surgical History:  Procedure Laterality Date   CESAREAN SECTION     CHOLECYSTECTOMY N/A 09/06/2013   Procedure: LAPAROSCOPIC CHOLECYSTECTOMY;  Surgeon: Harl Bowie, MD;  Location: [facility];  Service: General;  Laterality: N/A;   GANGLION CYST EXCISION Right 08/03/2021   Procedure: RIGHT REMOVAL GANGLION OF WRIST;  Surgeon: Sherilyn Cooter, MD;  Location: [facility];  Service: Orthopedics;  Laterality: Right;  regional with monitored anesthesia care   GANGLION CYST EXCISION Right 05/26/2022   Procedure: RIGHT WRIST VOLAR GANGLION CYST REMOVAL;  Surgeon: Leandrew Koyanagi, MD;  Location: [facility];  Service: Orthopedics;  Laterality: Right;   NASAL SINUS SURGERY  2003   TUBAL LIGATION      Family History  Problem Relation Age of Onset   Hypertension Mother    Hyperlipidemia Mother    Cervical cancer Mother    Diabetes Father    Hypertension Father    Irregular heart beat Father    Breast cancer Paternal Aunt    Throat cancer Maternal Grandmother    Prostate cancer Maternal Grandfather    Diabetes Other    Allergic rhinitis Daughter    Allergic rhinitis Daughter    Allergic rhinitis Son        milk   Stomach cancer Neg Hx    Colon cancer Neg Hx    Esophageal cancer Neg Hx    Pancreatic cancer Neg Hx     Social History   Tobacco Use   Smoking status: Never    Passive exposure: Never   Smokeless tobacco: Never  Vaping Use   Vaping Use: Never used  Substance Use Topics   Alcohol use: Yes    Comment: occ.   Drug use: No    ROS REFER TO HPI FOR PERTINENT POSITIVES AND NEGATIVES   Objective:   Vitals: BP (!) 159/94   Pulse 75   Temp 98.1 F (36.7 C)   Resp 20   LMP 04/23/2022   SpO2 98%   Physical Exam Vitals and nursing note reviewed.  Constitutional:      General: She is in acute distress.     Appearance: She is well-developed. She is not ill-appearing or diaphoretic.   Eyes:     General: No visual field deficit.    Extraocular Movements: Extraocular movements intact.     Pupils: Pupils are equal, round, and reactive to light.  Neck:      Comments: Patient is keeping her neck mostly flexed because of pain. Limited range of motion. Also has quivering in jaw muscles when trying to open her mouth.  Cardiovascular:     Rate and Rhythm: Normal rate and regular rhythm.     Heart  sounds: Normal heart sounds. No murmur heard. Pulmonary:     Effort: Pulmonary effort is normal.     Breath sounds: Normal breath sounds.  Abdominal:     Palpations: Abdomen is soft.  Musculoskeletal:     Cervical back: Rigidity and torticollis present. No spinous process tenderness or muscular tenderness.  Neurological:     Mental Status: She is alert and oriented to person, place, and time.     Cranial Nerves: No cranial nerve deficit, dysarthria or facial asymmetry.     Sensory: No sensory deficit.     Gait: Gait normal.     No results found for this or any previous visit (from the past 24 hour(s)).  Assessment and Plan :   PDMP not reviewed this encounter.  1. Head and face pain   2. Weakness generalized    Discussed with patient that she needs workup that are beyond the means of the urgent care.  She might be having reaction to anesthesia, although this seems very late for this to have a response.  She definitely needs pain management and electrolytes looked at.  She will present to the emergency department.  Discharged with family member, who will drive her there at this time.Fredirick Lathe, PA-C 06/12/22 [phone]

## 2022-06-12 NOTE — ED Triage Notes (Signed)
Pt reports posterior left sided headache that started 3 days ago. Pt reports it feels like constant pressure. Pt also reports she gets dizzy. Denies visual changes.

## 2022-06-12 NOTE — Discharge Instructions (Signed)
I do advise emergency department evaluation at this time. You may need labs and / or imaging, which we cannot complete appropriately at this facility.

## 2022-06-12 NOTE — ED Provider Notes (Signed)
[facility] Provider Note   CSN: [id number] Arrival date & time: 06/12/22  1734     History  Chief Complaint  Patient presents with   Headache    [name] is a 42 y.o. female history of diabetes here presenting with left-sided neck pain.  Patient states that she woke up 3 days ago has left-sided neck pain.  She felt like she slept on the wrong.  She went to urgent care was thought to have torticollis but sent here for further evaluation.  Patient denies any fevers.  States that she hurts when she turns her neck.  She had leftover Percocet and Robaxin from her previous surgery and she took those and did not help.  The history is provided by the patient.       Home Medications Prior to Admission medications   Medication Sig Start Date End Date Taking? Authorizing Provider  cetirizine (ZYRTEC) 10 MG tablet Take 1 tablet (10 mg total) by mouth daily. 06/05/21   Lattie Haw, MD  fluticasone (FLONASE) 50 MCG/ACT nasal spray Place 2 sprays into both nostrils daily. 06/18/21   Kennith Gain, MD  ketorolac (ACULAR) 0.5 % ophthalmic solution 1 drop 2 (two) times daily. 10/14/20   [provider]  levocetirizine (XYZAL) 5 MG tablet Take 1 tablet (5 mg total) by mouth in the morning and at bedtime. 06/18/21   Kennith Gain, MD  metFORMIN (GLUCOPHAGE-XR) 500 MG 24 hr tablet Take 1 tablet (500 mg total) by mouth daily with breakfast. 01/27/21   Ezequiel Essex, MD  methocarbamol (ROBAXIN-750) 750 MG tablet Take 1 tablet (750 mg total) by mouth 2 (two) times daily as needed for muscle spasms. 06/08/22   Aundra Dubin, PA-C  Olopatadine HCl (PATADAY) 0.2 % SOLN Place 1 drop into both eyes 2 (two) times daily as needed. Patient not taking: Reported on 11/17/2021 06/18/21   Kennith Gain, MD  omeprazole (PRILOSEC) 20 MG capsule Take 20 mg by mouth daily.    [provider]  oxyCODONE-acetaminophen  (PERCOCET) 5-325 MG tablet Take 1-2 tablets by mouth daily as needed for severe pain. 05/26/22   Leandrew Koyanagi, MD      Allergies    Amoxicillin, Penicillins, and Shellfish allergy    Review of Systems   Review of Systems  HENT:         Neck pain   Neurological:  Positive for headaches.  All other systems reviewed and are negative.   Physical Exam Updated Vital Signs BP 132/87   Pulse 85   Temp 97.8 F (36.6 C) (Oral)   Resp 13   LMP 04/23/2022   SpO2 100%  Physical Exam Vitals and nursing note reviewed.  Constitutional:      Comments: Uncomfortable  HENT:     Head: Normocephalic.     Mouth/Throat:     Mouth: Mucous membranes are moist.  Eyes:     Extraocular Movements: Extraocular movements intact.     Pupils: Pupils are equal, round, and reactive to light.  Neck:     Comments: Tenderness in the proximal aspect of the left SCM.  Patient has decreased range of motion due to pain.  No midline cervical tenderness Cardiovascular:     Rate and Rhythm: Normal rate and regular rhythm.     Heart sounds: Normal heart sounds.  Pulmonary:     Effort: Pulmonary effort is normal.     Breath sounds: Normal breath sounds.  Abdominal:     General: Bowel sounds are normal.     Palpations: Abdomen is soft.  Musculoskeletal:        General: Normal range of motion.  Skin:    General: Skin is warm.  Neurological:     Mental Status: She is alert and oriented to person, place, and time.     Comments: No facial droop and patient has normal strength bilateral arms and legs.  Psychiatric:        Mood and Affect: Mood normal.        Behavior: Behavior normal.     ED Results / Procedures / Treatments   Labs (all labs ordered are listed, but only abnormal results are displayed) Labs Reviewed  BASIC METABOLIC PANEL - Abnormal; Notable for the following components:      Result Value   Glucose, Bld 128 (*)    All other components within normal limits  CBC WITH DIFFERENTIAL/PLATELET -  Abnormal; Notable for the following components:   WBC 12.2 (*)    Neutro Abs 8.9 (*)    All other components within normal limits  RESP PANEL BY RT-PCR (RSV, FLU A&B, COVID)  RVPGX2  I-STAT BETA HCG BLOOD, ED ([facility], [facility], [facility] ONLY)    EKG None  Radiology CT Cervical Spine Wo Contrast  Result Date: 06/12/2022 CLINICAL DATA:  Headache and left-sided neck pain. EXAM: CT CERVICAL SPINE WITHOUT CONTRAST TECHNIQUE: Multidetector CT imaging of the cervical spine was performed without intravenous contrast. Multiplanar CT image reconstructions were also generated. RADIATION DOSE REDUCTION: This exam was performed according to the departmental dose-optimization program which includes automated exposure control, adjustment of the mA and/or kV according to patient size and/or use of iterative reconstruction technique. COMPARISON:  None Available. FINDINGS: Alignment: There is mild reversal of the normal cervical spine lordosis. Skull base and vertebrae: No acute fracture. No primary bone lesion or focal pathologic process. Soft tissues and spinal canal: No prevertebral fluid or swelling. No visible canal hematoma. Disc levels: Normal multilevel endplates are seen with normal multilevel intervertebral disc spaces. Normal, bilateral multilevel facet joints are noted. Upper chest: Negative. Other: None. IMPRESSION: 1. No acute fracture or subluxation in the cervical spine. 2. Mild reversal of the normal cervical spine lordosis, which may be due to positioning or muscle spasm. Electronically Signed   By: Virgina Norfolk M.D.   On: 06/12/2022 21:23   CT HEAD WO CONTRAST (5MM)  Result Date: 06/12/2022 CLINICAL DATA:  Neurological deficit. EXAM: CT HEAD WITHOUT CONTRAST TECHNIQUE: Contiguous axial images were obtained from the base of the skull through the vertex without intravenous contrast. RADIATION DOSE REDUCTION: This exam was performed according to the departmental dose-optimization program which includes  automated exposure control, adjustment of the mA and/or kV according to patient size and/or use of iterative reconstruction technique. COMPARISON:  None Available. FINDINGS: Brain: No evidence of acute infarction, hemorrhage, hydrocephalus, extra-axial collection or mass lesion/mass effect. Vascular: No hyperdense vessel or unexpected calcification. Skull: Normal. Negative for fracture or focal lesion. Sinuses/Orbits: No acute finding. Other: None. IMPRESSION: No acute intracranial pathology. Electronically Signed   By: Virgina Norfolk M.D.   On: 06/12/2022 21:21    Procedures Procedures    Medications Ordered in ED Medications  metoCLOPramide (REGLAN) injection 10 mg (10 mg Intravenous Given 06/12/22 2143)  diphenhydrAMINE (BENADRYL) injection 25 mg (25 mg Intravenous Given 06/12/22 2141)  diazepam (VALIUM) injection 5 mg (5 mg Intravenous Given 06/12/22 2140)  morphine (PF) 4 MG/ML injection 4 mg (  4 mg Intravenous Given 06/12/22 2315)  methylPREDNISolone sodium succinate (SOLU-MEDROL) 125 mg/2 mL injection 125 mg (125 mg Intravenous Given 06/12/22 2315)  ketorolac (TORADOL) 30 MG/ML injection 30 mg (30 mg Intravenous Given 06/12/22 2315)    ED Course/ Medical Decision Making/ A&P                             Medical Decision Making ABIR EROH is a 42 y.o. female here presenting with headache and left-sided neck pain.  I think likely torticollis.  Patient appears to have severe muscle spasms.  Patient has no meningeal signs or fever.  Plan to get CBC and BMP and CT head and cervical spine.  Will give muscle relaxants and migraine cocktail and reassess  11:33 PM I reviewed patient's labs and independently interpreted CT scan.  CT head and neck unremarkable.  Patient does have some muscle spasm noted on the CT cervical spine.  Patient was given NSAIDs and also steroids in addition and pain is under control.  She requested a soft collar for comfort.  Will discharge home with NSAIDs and pain  medicine and steroids and muscle relaxants.   Problems Addressed: Torticollis: acute illness or injury  Amount and/or Complexity of Data Reviewed Labs: ordered. Decision-making details documented in ED Course. Radiology: ordered and independent interpretation performed. Decision-making details documented in ED Course.  Risk Prescription drug management.    Final Clinical Impression(s) / ED Diagnoses Final diagnoses:  None    Rx / DC Orders ED Discharge Orders     None         Drenda Freeze, MD 06/12/22 2334

## 2022-06-12 NOTE — ED Triage Notes (Signed)
Pt reports she has stiff neck ,posterior head pain . Pt reports she can not turn her head side to side . Pt had a repair of a RT ganglion cyst on 05-26-22. Pt has contacted her MD about not being able to sleep. Pt was instructed to take benadryl to help her sleep.

## 2022-06-12 NOTE — Progress Notes (Signed)
Orthopedic Tech Progress Note Patient Details:  [name] Oct 10, 1980 [id number]  Ortho Devices Type of Ortho Device: Soft collar Ortho Device/Splint Interventions: Ordered, Application, Adjustment   Post Interventions Patient Tolerated: Well Instructions Provided: Care of device, Adjustment of device  [name] 06/12/2022, 11:55 PM

## 2022-06-23 ENCOUNTER — Encounter: Payer: Self-pay | Admitting: Orthopaedic Surgery

## 2022-06-23 ENCOUNTER — Ambulatory Visit (INDEPENDENT_AMBULATORY_CARE_PROVIDER_SITE_OTHER): Payer: Self-pay | Admitting: Physician Assistant

## 2022-06-23 DIAGNOSIS — M67431 Ganglion, right wrist: Secondary | ICD-10-CM

## 2022-06-23 NOTE — Progress Notes (Signed)
Post-Op Visit Note   Patient: [name]           Date of Birth: Dec 18, 1980           MRN: [id number] Visit Date: 06/23/2022 PCP: Ezequiel Essex, MD   Assessment & Plan:  Chief Complaint:  Chief Complaint  Patient presents with   Right Wrist - Follow-up    Right wrist ganglion cyst removal 05/26/22   Visit Diagnoses:  1. Ganglion cyst of dorsum of right wrist     Plan: Patient is a 42 year old female who comes in today approximately 4 weeks status post right wrist cyst removal 05/26/2022.  She notes very little discomfort to the wrist.  She does note slight paresthesias around the incision.  Overall, she has been improving.  Examination of her right wrist reveals a fully healed surgical scar without complication.  Full range of motion of her hand and fingers.  Fingers are warm and well-perfused and she is neurovascular intact distally.  At this point, her incision has healed and she may advance with activity as tolerated.  I provided her with a work note for light duty only no lifting more than 5 pounds and no use of the grill right upper extremity for the next 4 weeks.  She will follow-up as needed.  Follow-Up Instructions: Return if symptoms worsen or fail to improve.   Orders:  No orders of the defined types were placed in this encounter.  No orders of the defined types were placed in this encounter.   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   Hypochondriasis 08/19/2014   Cystic thyroid nodule 07/03/2014   Prediabetes 04/02/2014   Seasonal allergies 10/24/2012   Multiple food allergies 10/24/2012   Obesity, unspecified 07/23/2012   Fatty liver disease, nonalcoholic 96/28/3662   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   Past Medical History:  Diagnosis Date   Abdominal pain 03/29/2021   Allergic reaction 06/07/2021   Angio-edema    ANXIETY 12/31/2008   Qualifier: Diagnosis of  By: Nadara Eaton  MD, Mickel Baas     Breast mass in female 94/76/5465   Complication of anesthesia    Problem waking up once and a headache   Condyloma acuminatum 07/21/2006   Qualifier: History of  By: Oneal Grout MD, Manuela Schwartz     Depression    Diabetes mellitus without complication ([place])    Eczema    Family history of anesthesia complication    Mother had problem waking up   Hx gestational diabetes    Laryngitis 02/15/2020   Menorrhagia 07/03/2014   Molluscum contagiosum infection 12/01/2018   Muscle strain of lower leg, right, initial encounter 03/05/2019   Pain in right wrist 11/03/2021   Spinal headache    Thyroid fullness 04/02/2014   Upper respiratory tract infection 04/20/2022   Urticaria    Vaginal lesion 11/13/2020   Voice hoarseness 05/22/2020    Family History  Problem Relation Age of Onset  Hypertension Mother    Hyperlipidemia Mother    Cervical cancer Mother    Diabetes Father    Hypertension Father    Irregular heart beat Father    Breast cancer Paternal Aunt    Throat cancer Maternal Grandmother    Prostate cancer Maternal Grandfather    Diabetes Other    Allergic rhinitis Daughter    Allergic rhinitis Daughter    Allergic rhinitis Son        milk   Stomach cancer Neg Hx    Colon cancer Neg Hx    Esophageal cancer Neg Hx    Pancreatic cancer Neg Hx     Past Surgical History:  Procedure Laterality Date   CESAREAN SECTION     CHOLECYSTECTOMY N/A 09/06/2013   Procedure: LAPAROSCOPIC CHOLECYSTECTOMY;   Surgeon: Harl Bowie, MD;  Location: [facility];  Service: General;  Laterality: N/A;   GANGLION CYST EXCISION Right 08/03/2021   Procedure: RIGHT REMOVAL GANGLION OF WRIST;  Surgeon: Sherilyn Cooter, MD;  Location: [facility];  Service: Orthopedics;  Laterality: Right;  regional with monitored anesthesia care   GANGLION CYST EXCISION Right 05/26/2022   Procedure: RIGHT WRIST VOLAR GANGLION CYST REMOVAL;  Surgeon: Leandrew Koyanagi, MD;  Location: [facility];  Service: Orthopedics;  Laterality: Right;   NASAL SINUS SURGERY  2003   TUBAL LIGATION     Social History   Occupational History   Not on file  Tobacco Use   Smoking status: Never    Passive exposure: Never   Smokeless tobacco: Never  Vaping Use   Vaping Use: Never used  Substance and Sexual Activity   Alcohol use: Yes    Comment: occ.   Drug use: No   Sexual activity: Yes    Birth control/protection: Surgical

## 2022-06-25 ENCOUNTER — Ambulatory Visit (INDEPENDENT_AMBULATORY_CARE_PROVIDER_SITE_OTHER): Payer: No Typology Code available for payment source | Admitting: Family Medicine

## 2022-06-25 ENCOUNTER — Encounter: Payer: Self-pay | Admitting: Family Medicine

## 2022-06-25 VITALS — BP 134/71 | HR 98 | Resp 18 | Ht 62.0 in | Wt 172.6 lb

## 2022-06-25 DIAGNOSIS — M62838 Other muscle spasm: Secondary | ICD-10-CM

## 2022-06-25 MED ORDER — CYCLOBENZAPRINE HCL 5 MG PO TABS: 5.0000 mg | ORAL_TABLET | Freq: Three times a day (TID) | ORAL | 1 refills | Status: DC | PRN

## 2022-06-25 NOTE — Patient Instructions (Addendum)
It was wonderful to see you today. Thank you for allowing me to be a part of your care. Below is a short summary of what we discussed at your visit today:  Neck pain I have sent in a script for more flexeril for you. Continue to use as needed for muscle spasm.   Go for massage, this is excellent to help with muscle spasm.   Look up and try doing trigger point massage or pinching. You can get a Thera-cane from [employer] to help you reach those trigger points in the back of your shoulders and neck on your own.   Feeling poorly after eating Try eating smaller meals. Eat only small amounts of food that trigger you.  Your next A1c is due in March, please return for that lab in March.  I want you to go back to GI to talk about your ongoing symptoms and re-schedule your upper endoscopy (EGD).  Insomnia Today we discussed your difficulty sleeping. I recommend two things to start: melatonin and a regular wake up time.   Melatonin: Take 5 mg of over-the counter melatonin about 30 minutes before bed. I recommend taking it before you move to the bedroom to put on pajamas and brush your teeth.   Regular wake up time: Wake up at the same time every day and do not take day time naps.   See below for more tips and web sites with reliable information for you.   Please bring all of your medications to every appointment!  If you have any questions or concerns, please do not hesitate to contact us via phone or MyChart message.   Ezequiel Essex, MD    CDC tips for better sleep http://www.davis-wright.info/     American Society of Sleep Medicine  https://sleepeducation.org/healthy-sleep/healthy-sleep-habits/    Healthy Sleep Habits Your behaviors during the day, and especially before bedtime, can have a major impact on your sleep. They can promote healthy sleep or contribute to sleeplessness.  Your daily routines - what you eat and drink, the medications you take,  how you schedule your days and how you choose to spend your evenings - can significantly impact your quality of sleep. Even a few slight adjustments can, in some cases, mean the difference between sound sleep and a restless night.  The term "sleep hygiene" refers to a series of healthy sleep habits that can improve your ability to fall asleep and stay asleep. These habits can help improve your sleep health.  When people struggle with insomnia, sleep hygiene is an important part of cognitive behavioral therapy (CBT), the most effective long-term treatment for people with chronic insomnia. CBT for insomnia can help you address the thoughts and behaviors that prevent you from sleeping well. It also includes techniques for stress reduction, relaxation and sleep schedule management.  If you have difficulty sleeping or want to improve your sleep, try following these healthy sleep habits. Talk to your medical provider if your sleep problem persists. You also can seek help from the sleep team at an AASM accredited sleep center.  Quick sleep tips Keep a consistent sleep schedule. Get up at the same time every day, even on weekends or during vacations. Set a bedtime that is early enough for you to get at least 7-8 hours of sleep. Don't go to bed unless you are sleepy. If you don't fall asleep after 20 minutes, get out of bed. Go do a quiet activity without a lot of light exposure. It is especially important to not  get on electronics. Establish a relaxing bedtime routine. Use your bed only for sleep and sex. Make your bedroom quiet and relaxing. Keep the room at a comfortable, cool temperature. Limit exposure to bright light in the evenings. Turn off electronic devices at least 30 minutes before bedtime. Don't eat a large meal before bedtime. If you are hungry at night, eat a light, healthy snack. Exercise regularly and maintain a healthy diet. Avoid consuming caffeine in the afternoon or evening. Avoid  consuming alcohol before bedtime. Reduce your fluid intake before bedtime.

## 2022-06-25 NOTE — Progress Notes (Signed)
    SUBJECTIVE:   CHIEF COMPLAINT / HPI:   ED follow up Ms. Cueva presents for Ed follow up. Was seen in [facility] ED 1/20 for 3 days of worsening left sided neck pain and stiffness. Evaluated for muscle spasm of neck, dx torticollis. Treated with muscle relaxant, migraine cocktail, NSAIDs, and steroids with improvement. CT head and neck unremarkable for acute changes, did show muscle spasm of neck.  Today, she reports overall improvement with the ED interventions, including pain and neck ROM. However, she is still experiencing pain and stiffness.   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   Hypochondriasis 08/19/2014   Cystic thyroid nodule 07/03/2014   Prediabetes 04/02/2014   Seasonal allergies 10/24/2012   Multiple food allergies 10/24/2012   Obesity, unspecified 07/23/2012   Fatty liver disease, nonalcoholic 22/44/9753   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 No red flags. Discussed massage, stretching, and self-myofascial release. Provided handout and picture of Thera-Cane. Offered to refer for trigger point injections at pain management or trigger point myofascial release at PT. Patient prefers to start with this conservative self-management and request referral at later date if needed. Rx flexeril for PRN use.      Ezequiel Essex, MD [place]

## 2022-06-27 DIAGNOSIS — M62838 Other muscle spasm: Secondary | ICD-10-CM | POA: Insufficient documentation

## 2022-06-27 NOTE — Assessment & Plan Note (Signed)
Neck pain and stiffness likely spasm, given physical exam and prior CT head/neck findings. No red flags. Discussed massage, stretching, and self-myofascial release. Provided handout and picture of Thera-Cane. Offered to refer for trigger point injections at pain management or trigger point myofascial release at PT. Patient prefers to start with this conservative self-management and request referral at later date if needed. Rx flexeril for PRN use.

## 2022-10-07 ENCOUNTER — Ambulatory Visit (INDEPENDENT_AMBULATORY_CARE_PROVIDER_SITE_OTHER): Payer: Self-pay | Admitting: Student

## 2022-10-07 VITALS — BP 112/68 | HR 76 | Ht 62.0 in | Wt 176.0 lb

## 2022-10-07 DIAGNOSIS — J0100 Acute maxillary sinusitis, unspecified: Secondary | ICD-10-CM

## 2022-10-07 MED ORDER — DOXYCYCLINE HYCLATE 100 MG PO TABS: 100.0000 mg | ORAL_TABLET | Freq: Two times a day (BID) | ORAL | 0 refills | Status: AC

## 2022-10-07 NOTE — Patient Instructions (Addendum)
It was great to see you! Thank you for allowing me to participate in your care!  It's sounding like you may have a sinus infection. This could be causing your symptoms and affecting your voice. We will treat you and have you f/u in 2 weeks if symptoms/voice not improved  Our plans for today:  - Sinus Infection  Doxycycline 100 mg Twice a day x 7 days Be careful about sun exposure on this antibiotic, be sure to wear sunscreen/cover skin   Take pills with food, they can upset stomach if not taken with food  Follow up in 2 weeks, or sooner if not improving Voice can take some time to recover, make appointment if not improving in 2 weeks  Seek Immediate Medical Care if   You develop fevers (100.4 or higher)   You develop Head ache, not responding to pain meds   You develop facial pain/swelling   Symptoms worsen     Take care and seek immediate care sooner if you develop any concerns.   Dr. Bess Kinds, MD [facility]

## 2022-10-07 NOTE — Progress Notes (Signed)
  SUBJECTIVE:   CHIEF COMPLAINT / HPI:   Sore voice/lost voice Going on for about a month, and believed it was allergies, but has been taking levocterizine and Flonase. Lost her voice about 3-4 days ago. Denies any fever or malaise, but is noting she feels tired when talking. Feels like she has pressure/bubble in chest and may have been wheezing yesterday. Is also noting pain around ribs and in back that started w/ her coughing. Is also noting a lot of runny nose w/ some blood tinged mucous. Notes that her cough and runny nose are worse outside.   Notes that her voice has been challenging since she was around cleaning products at work, and they were cleaning in [employer] w/ deep clean, about a month ago. Has had the same symptoms of SOB, dry cough, and runny nose, when she had PNA last.   PERTINENT  PMH / PSH:   Patient Care Team: Fayette Pho, MD as PCP - General (Family Medicine) OBJECTIVE:  BP 112/68   Pulse 76   Ht 5\' 2"  (1.575 m)   Wt 176 lb (79.8 kg)   SpO2 98%   BMI 32.19 kg/m  Physical Exam Constitutional:      General: She is not in acute distress.    Appearance: Normal appearance. She is not ill-appearing.  HENT:     Nose: Nasal tenderness present. No congestion or rhinorrhea.     Right Turbinates: Enlarged.     Left Turbinates: Enlarged.      Mouth/Throat:     Mouth: Mucous membranes are moist.     Pharynx: Oropharynx is clear. No oropharyngeal exudate or posterior oropharyngeal erythema.  Eyes:     Conjunctiva/sclera: Conjunctivae normal.  Cardiovascular:     Rate and Rhythm: Normal rate and regular rhythm.     Pulses: Normal pulses.     Heart sounds: Normal heart sounds. No murmur heard.    No friction rub. No gallop.  Pulmonary:     Effort: Pulmonary effort is normal. No respiratory distress.     Breath sounds: Normal breath sounds. No stridor. No wheezing, rhonchi or rales.  Neurological:     Mental Status: She is alert.      ASSESSMENT/PLAN:   Acute non-recurrent maxillary sinusitis Assessment & Plan: Patient notes URI symptoms going on for a month  Not improved with levocetirizine and Flonase use.  Patient lost her voice 3 to 4 days ago.  Denies any fever or malaise but is noting that she is tired while talking.  She feels like there is pressure in her chest and she may have been wheezing yesterday.  She also has pain in her ribs from coughing with a lot of runny nose and some blood-tinged mucus.  Patient's symptoms concerning for sinus infection, as she is already on allergy medications, and is unlikely to have pneumonia with no fevers or malaise.  Will trial antibiotics. - Doxycycline 100 mg twice daily x 7 days - Return precautions for continued/worsening fever, facial pain - Precautions for Doxy use, (sun exposure, eating before use)   Other orders -     Doxycycline Hyclate; Take 1 tablet (100 mg total) by mouth 2 (two) times daily for 7 days.  Dispense: 14 tablet; Refill: 0   No follow-ups on file. Bess Kinds, MD 10/09/2022, 11:54 PM PGY-2, [facility]

## 2022-10-09 DIAGNOSIS — J0100 Acute maxillary sinusitis, unspecified: Secondary | ICD-10-CM | POA: Insufficient documentation

## 2022-10-09 HISTORY — DX: Acute maxillary sinusitis, unspecified: J01.00

## 2022-10-09 NOTE — Assessment & Plan Note (Signed)
Patient notes URI symptoms going on for a month  Not improved with levocetirizine and Flonase use.  Patient lost her voice 3 to 4 days ago.  Denies any fever or malaise but is noting that she is tired while talking.  She feels like there is pressure in her chest and she may have been wheezing yesterday.  She also has pain in her ribs from coughing with a lot of runny nose and some blood-tinged mucus.  Patient's symptoms concerning for sinus infection, as she is already on allergy medications, and is unlikely to have pneumonia with no fevers or malaise.  Will trial antibiotics. - Doxycycline 100 mg twice daily x 7 days - Return precautions for continued/worsening fever, facial pain - Precautions for Doxy use, (sun exposure, eating before use)

## 2022-11-02 ENCOUNTER — Encounter: Payer: Self-pay | Admitting: Orthopaedic Surgery

## 2022-11-02 ENCOUNTER — Ambulatory Visit (INDEPENDENT_AMBULATORY_CARE_PROVIDER_SITE_OTHER): Payer: Self-pay | Admitting: Orthopaedic Surgery

## 2022-11-02 DIAGNOSIS — M67431 Ganglion, right wrist: Secondary | ICD-10-CM

## 2022-11-02 NOTE — Progress Notes (Signed)
Office Visit Note   Patient: [name]           Date of Birth: 1981-05-13           MRN: [id number] Visit Date: 11/02/2022              Requested by: Fayette Pho, MD [address] [place],  [place] [address] PCP: Fayette Pho, MD   Assessment & Plan: Visit Diagnoses:  1. 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  She is having symptoms again and would like to have this removed.  Due to the nature of the condition I have recommended that she see a hand surgeon for this.  She would like to make an appointment for Dr. Denese Killings when he starts here in July.  Follow-Up Instructions: No follow-ups on file.   Orders:  No orders of the defined types were placed in this encounter.  No orders of the defined types were placed in this encounter.     Procedures: No procedures performed   Clinical Data: No additional findings.   Subjective: No chief complaint on file.   HPI Patient comes in today for recurrent right ganglion cyst formation on the volar right wrist.  States that she is having trouble with this work. Review of Systems   Objective: Vital Signs: There were no vitals taken for this visit.  Physical Exam Ortho Exam Examination of the right wrist is consistent with a recurrent right volar wrist ganglion cyst.  No neurovascular compromise to the right hand.  Fully healed surgical scar. Specialty Comments:  No specialty comments available.  Imaging: No results found.   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   Coalition, 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   Hypochondriasis 08/19/2014   Cystic thyroid nodule 07/03/2014   Prediabetes 04/02/2014   Seasonal allergies 10/24/2012   Multiple food allergies 10/24/2012   Obesity, unspecified 07/23/2012   Fatty liver disease, nonalcoholic 09/18/2009   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   Past Medical History:  Diagnosis Date   Abdominal pain 03/29/2021   Allergic reaction 06/07/2021   Angio-edema    ANXIETY 12/31/2008   Qualifier: Diagnosis of  By: Georgiana Shore  MD, Vernona Rieger     Breast mass in female 10/06/2017   Complication of anesthesia    Problem waking up once and a headache   Condyloma acuminatum 07/21/2006   Qualifier: History of  By: Lafonda Mosses MD, Darl Pikes     Depression    Diabetes mellitus without complication (HCC)    Eczema    Family history of anesthesia complication    Mother had problem waking up   Hx gestational diabetes    Laryngitis 02/15/2020   Menorrhagia 07/03/2014   Molluscum contagiosum infection 12/01/2018   Muscle strain of lower leg, right, initial encounter 03/05/2019   Pain in  right wrist 11/03/2021   Spinal headache    Thyroid fullness 04/02/2014   Upper respiratory tract infection 04/20/2022   Urticaria    Vaginal lesion 11/13/2020   Voice hoarseness 05/22/2020    Family History  Problem Relation Age of Onset   Hypertension Mother    Hyperlipidemia Mother    Cervical cancer Mother    Diabetes Father    Hypertension Father    Irregular heart beat Father    Breast cancer Paternal Aunt    Throat cancer Maternal Grandmother    Prostate cancer Maternal Grandfather    Diabetes Other    Allergic rhinitis Daughter    Allergic rhinitis Daughter    Allergic rhinitis Son        milk    Stomach cancer Neg Hx    Colon cancer Neg Hx    Esophageal cancer Neg Hx    Pancreatic cancer Neg Hx     Past Surgical History:  Procedure Laterality Date   CESAREAN SECTION     CHOLECYSTECTOMY N/A 09/06/2013   Procedure: LAPAROSCOPIC CHOLECYSTECTOMY;  Surgeon: Shelly Rubenstein, MD;  Location: [facility];  Service: General;  Laterality: N/A;   GANGLION CYST EXCISION Right 08/03/2021   Procedure: RIGHT REMOVAL GANGLION OF WRIST;  Surgeon: Marlyne Beards, MD;  Location: [facility];  Service: Orthopedics;  Laterality: Right;  regional with monitored anesthesia care   GANGLION CYST EXCISION Right 05/26/2022   Procedure: RIGHT WRIST VOLAR GANGLION CYST REMOVAL;  Surgeon: Tarry Kos, MD;  Location: [facility];  Service: Orthopedics;  Laterality: Right;   NASAL SINUS SURGERY  2003   TUBAL LIGATION     Social History   Occupational History   Not on file  Tobacco Use   Smoking status: Never    Passive exposure: Never   Smokeless tobacco: Never  Vaping Use   Vaping Use: Never used  Substance and Sexual Activity   Alcohol use: Yes    Comment: occ.   Drug use: No   Sexual activity: Yes    Birth control/protection: Surgical

## 2022-11-05 ENCOUNTER — Other Ambulatory Visit: Payer: Self-pay

## 2022-11-05 DIAGNOSIS — R7303 Prediabetes: Secondary | ICD-10-CM

## 2022-11-05 MED ORDER — METFORMIN HCL ER 500 MG PO TB24: 500.0000 mg | ORAL_TABLET | Freq: Every day | ORAL | 3 refills | Status: DC

## 2022-11-05 NOTE — Telephone Encounter (Signed)
Patient calls nurse line requesting refill on metformin.   [place] and [place] medication.   She is requesting refill be sent to the [facility] in [place].    Scheduled patient for diabetes follow up on 11/09/22.  Will forward request to PCP.   Veronda Prude, RN

## 2022-11-09 ENCOUNTER — Ambulatory Visit (INDEPENDENT_AMBULATORY_CARE_PROVIDER_SITE_OTHER): Payer: Self-pay | Admitting: Family Medicine

## 2022-11-09 VITALS — BP 118/68 | HR 84 | Ht 62.0 in | Wt 176.0 lb

## 2022-11-09 DIAGNOSIS — E782 Mixed hyperlipidemia: Secondary | ICD-10-CM

## 2022-11-09 DIAGNOSIS — R7303 Prediabetes: Secondary | ICD-10-CM

## 2022-11-09 DIAGNOSIS — R109 Unspecified abdominal pain: Secondary | ICD-10-CM

## 2022-11-09 DIAGNOSIS — R490 Dysphonia: Secondary | ICD-10-CM

## 2022-11-09 LAB — POCT URINALYSIS DIP (MANUAL ENTRY)
Bilirubin, UA: NEGATIVE
Blood, UA: NEGATIVE
Glucose, UA: NEGATIVE mg/dL
Ketones, POC UA: NEGATIVE mg/dL
Leukocytes, UA: NEGATIVE
Nitrite, UA: NEGATIVE
Protein Ur, POC: NEGATIVE mg/dL
Spec Grav, UA: 1.025 (ref 1.010–1.025)
Urobilinogen, UA: 0.2 E.U./dL
pH, UA: 6 (ref 5.0–8.0)

## 2022-11-09 LAB — POCT GLYCOSYLATED HEMOGLOBIN (HGB A1C): HbA1c, POC (controlled diabetic range): 6.4 % (ref 0.0–7.0)

## 2022-11-09 MED ORDER — METFORMIN HCL ER 500 MG PO TB24: 1000.0000 mg | ORAL_TABLET | Freq: Every day | ORAL | 3 refills | Status: DC

## 2022-11-09 NOTE — Progress Notes (Signed)
SUBJECTIVE:   CHIEF COMPLAINT / HPI:   Pre-diabetes follow up Currently on metformin 500 mg with breakfast Concurrent diagnoses of prediabetes, hyperlipidemia, and nonalcoholic fatty liver disease  Lab Results  Component Value Date   HGBA1C 6.4 11/09/2022   HGBA1C 6.3 (H) 05/04/2022   HGBA1C 5.8 02/27/2021   Lab Results  Component Value Date   LDLCALC 131 (H) 05/04/2022   CREATININE 0.59 06/12/2022   Other complaints Experienced a bout of symptoms a week or two ago Symptoms include:  Feeling dehydrated Flank pain Urine smells sweet and is abnormally colored Dizzy "out of no where" Really tired, oversleeping Happens more when she eats breakfast - every time she eats egg she gets dizzy Nauseous Whole body tingling Tachycardia Ears feel clogged, like a pressure Difficulty sleeping due to feeling anxious Some SOB Took BP during one of the episodes, 151/97 Ever since that first episode, she has felt generally malaised   Vertigo and tinnitus back again Voice slowly becoming more hoarse  Insomnia Takes melatonin gummy, 2 gummies, at night Wakes up 3-4 am despite melatonin  PERTINENT  PMH / PSH:  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   Cystic thyroid nodule 07/03/2014   Prediabetes 04/02/2014   Seasonal allergies 10/24/2012   Multiple food allergies 10/24/2012   Obesity, unspecified 07/23/2012   Fatty liver disease, nonalcoholic 09/18/2009   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    OBJECTIVE:   BP  118/68   Pulse 84   Ht 5\' 2"  (1.575 m)   Wt 176 lb (79.8 kg)   SpO2 98%   BMI 32.19 kg/m    PHQ-9:     11/09/2022    8:45 AM 10/07/2022   10:44 AM 06/25/2022   11:37 AM  Depression screen PHQ 2/9  Decreased Interest 1 1 1   Down, Depressed, Hopeless 0 0 0  PHQ - 2 Score 1 1 1   Altered sleeping 2 1 3   Tired, decreased energy 3 1 2   Change in appetite 2 0 0  Feeling bad or failure about yourself  0 0 0  Trouble concentrating 0 0 1  Moving slowly or fidgety/restless 0 0 0  Suicidal thoughts 0 0 0  PHQ-9 Score 8 3 7   Difficult doing work/chores  Somewhat difficult Somewhat difficult    Physical Exam General: Awake, alert, oriented HEENT: PERRL, bilateral TM pearly pink and flat, bilateral external auditory canals with minimal cerumen burden, no lesions, nasal mucosa slightly edematous, oral mucosa pink, moist, without lesion, intact dentition without obvious cavity Lymph: No palpable lymphedema of head or neck, thyroid uniformly full to palpation (right side more swollen than left) Cardiovascular: Regular rate and rhythm, S1 and S2 present, no murmurs auscultated Respiratory: Lung fields clear to auscultation bilaterally  ASSESSMENT/PLAN:   Prediabetes A1c slightly worsened despite metformin 500 mg daily. Now A1c of 6.4. Wonder if some of the nausea symptoms stem from her taking the metformin on an empty stomach before breakfast.  - increase metformin from 500 mg to 1000 mg once  daily - try taking at bedtime to mitigate nausea symptoms - next recheck 3 months or so  Flank pain Patient concerned about intermittent right flank pain and abnormally colored urine. No clear dysuria, fever, or chills. Will check UA today for reassurance. No antibiotics indicated at this time.   Hyperlipidemia Will check direct LDL today with other blood work.   Hoarseness of voice Chronic, insidious worsening of hoarse voice. Does have visible and palpable fullness of her thyroid on exam. No red  flags, able to PO and breathe normally. Will obtain thyroid US, TSH w/ reflex to T4, and CMP.     Fayette Pho, MD [facility]

## 2022-11-09 NOTE — Assessment & Plan Note (Signed)
Patient concerned about intermittent right flank pain and abnormally colored urine. No clear dysuria, fever, or chills. Will check UA today for reassurance. No antibiotics indicated at this time.

## 2022-11-09 NOTE — Assessment & Plan Note (Signed)
Will check direct LDL today with other blood work.

## 2022-11-09 NOTE — Assessment & Plan Note (Signed)
A1c slightly worsened despite metformin 500 mg daily. Now A1c of 6.4. Wonder if some of the nausea symptoms stem from her taking the metformin on an empty stomach before breakfast.  - increase metformin from 500 mg to 1000 mg once daily - try taking at bedtime to mitigate nausea symptoms - next recheck 3 months or so

## 2022-11-09 NOTE — Patient Instructions (Addendum)
It was wonderful to see you today. Thank you for allowing me to be a part of your care. Below is a short summary of what we discussed at your visit today:  Pre-diabetes Today your A1c was 6.4, compared to 6.3 in December.  Continue your metformin every day Increase to two tablets (1,000 mg total) Take it once a day Try to take at bedtime, see if this helps to improve your symptoms See the handout from the American diabetes Association on prediabetes. Visit the American diabetes Association website, they have lots of great information on prediabetes  Multiple concerns Blood work today. We will test your thyroid, kidneys, liver, cholesterol, and urine. If the results are normal, I will send you a letter or MyChart message. If the results are abnormal, I will give you a call.    Thyroid ultrasound. [facility] will call you to schedule this study.    If you have any questions or concerns, please do not hesitate to contact us via phone or MyChart message.   Fayette Pho, MD   Insomnia Today we discussed your difficulty sleeping. I recommend two things to start: melatonin and a regular wake up time.   Melatonin: Take 5 mg of over-the counter melatonin about 30 minutes before bed. I recommend taking it before you move to the bedroom to put on pajamas and brush your teeth.   Regular wake up time: Wake up at the same time every day and do not take day time naps.   See below for more tips and web sites with reliable information for you.   If you have any questions or concerns, please do not hesitate to contact us via phone or MyChart message.   Fayette Pho, MD   CDC tips for better sleep [employer]     American Society of Sleep Medicine  https://sleepeducation.org/healthy-sleep/healthy-sleep-habits/    Healthy Sleep Habits Your behaviors during the day, and especially before bedtime, can have a major impact on your  sleep. They can promote healthy sleep or contribute to sleeplessness.  Your daily routines - what you eat and drink, the medications you take, how you schedule your days and how you choose to spend your evenings - can significantly impact your quality of sleep. Even a few slight adjustments can, in some cases, mean the difference between sound sleep and a restless night.  The term "sleep hygiene" refers to a series of healthy sleep habits that can improve your ability to fall asleep and stay asleep. These habits can help improve your sleep health.  When people struggle with insomnia, sleep hygiene is an important part of cognitive behavioral therapy (CBT), the most effective long-term treatment for people with chronic insomnia. CBT for insomnia can help you address the thoughts and behaviors that prevent you from sleeping well. It also includes techniques for stress reduction, relaxation and sleep schedule management.  If you have difficulty sleeping or want to improve your sleep, try following these healthy sleep habits. Talk to your medical provider if your sleep problem persists. You also can seek help from the sleep team at an AASM accredited sleep center.  Quick sleep tips Keep a consistent sleep schedule. Get up at the same time every day, even on weekends or during vacations. Set a bedtime that is early enough for you to get at least 7-8 hours of sleep. Don't go to bed unless you are sleepy. If you don't fall asleep after 20 minutes, get out of bed. Go do a quiet activity  without a lot of light exposure. It is especially important to not get on electronics. Establish a relaxing bedtime routine. Use your bed only for sleep and sex. Make your bedroom quiet and relaxing. Keep the room at a comfortable, cool temperature. Limit exposure to bright light in the evenings. Turn off electronic devices at least 30 minutes before bedtime. Don't eat a large meal before bedtime. If you are hungry at  night, eat a light, healthy snack. Exercise regularly and maintain a healthy diet. Avoid consuming caffeine in the afternoon or evening. Avoid consuming alcohol before bedtime. Reduce your fluid intake before bedtime.

## 2022-11-09 NOTE — Assessment & Plan Note (Addendum)
Chronic, insidious worsening of hoarse voice. Does have visible and palpable fullness of her thyroid on exam. No red flags, able to PO and breathe normally. Will obtain thyroid US, TSH w/ reflex to T4, and CMP.

## 2022-11-10 LAB — COMPREHENSIVE METABOLIC PANEL
ALT: 19 IU/L (ref 0–32)
AST: 12 IU/L (ref 0–40)
Albumin: 4.1 g/dL (ref 3.9–4.9)
Alkaline Phosphatase: 98 IU/L (ref 44–121)
BUN/Creatinine Ratio: 23 (ref 9–23)
BUN: 15 mg/dL (ref 6–24)
Bilirubin Total: <0.2 mg/dL (ref 0.0–1.2)
CO2: 23 mmol/L (ref 20–29)
Calcium: 9.2 mg/dL (ref 8.7–10.2)
Chloride: 102 mmol/L (ref 96–106)
Creatinine, Ser: 0.65 mg/dL (ref 0.57–1.00)
Globulin, Total: 2.7 g/dL (ref 1.5–4.5)
Glucose: 115 mg/dL — ABNORMAL HIGH (ref 70–99)
Potassium: 4.5 mmol/L (ref 3.5–5.2)
Sodium: 139 mmol/L (ref 134–144)
Total Protein: 6.8 g/dL (ref 6.0–8.5)
eGFR: 113 mL/min/{1.73_m2} (ref >59)

## 2022-11-10 LAB — LDL CHOLESTEROL, DIRECT: LDL Direct: 103 mg/dL — ABNORMAL HIGH (ref 0–99)

## 2022-11-10 LAB — TSH RFX ON ABNORMAL TO FREE T4: TSH: 2.06 u[IU]/mL (ref 0.450–4.500)

## 2022-11-11 ENCOUNTER — Ambulatory Visit
Admission: RE | Admit: 2022-11-11 | Discharge: 2022-11-11 | Disposition: A | Discharge status: Home / Self Care | Payer: Self-pay | Source: Ambulatory Visit | Attending: Family Medicine | Admitting: Family Medicine

## 2022-11-11 DIAGNOSIS — R490 Dysphonia: Secondary | ICD-10-CM

## 2022-11-24 ENCOUNTER — Other Ambulatory Visit: Payer: Self-pay

## 2022-11-24 DIAGNOSIS — M67431 Ganglion, right wrist: Secondary | ICD-10-CM

## 2022-12-17 ENCOUNTER — Encounter: Payer: Self-pay | Admitting: Student

## 2022-12-17 ENCOUNTER — Ambulatory Visit (INDEPENDENT_AMBULATORY_CARE_PROVIDER_SITE_OTHER): Payer: Self-pay | Admitting: Student

## 2022-12-17 VITALS — BP 130/72 | HR 64 | Ht 62.0 in | Wt 177.1 lb

## 2022-12-17 DIAGNOSIS — K5900 Constipation, unspecified: Secondary | ICD-10-CM

## 2022-12-17 DIAGNOSIS — G8929 Other chronic pain: Secondary | ICD-10-CM

## 2022-12-17 DIAGNOSIS — R1011 Right upper quadrant pain: Secondary | ICD-10-CM

## 2022-12-17 DIAGNOSIS — K219 Gastro-esophageal reflux disease without esophagitis: Secondary | ICD-10-CM

## 2022-12-17 DIAGNOSIS — R109 Unspecified abdominal pain: Secondary | ICD-10-CM

## 2022-12-17 LAB — POCT URINALYSIS DIP (MANUAL ENTRY)
Bilirubin, UA: NEGATIVE
Blood, UA: NEGATIVE
Glucose, UA: NEGATIVE mg/dL
Ketones, POC UA: NEGATIVE mg/dL
Nitrite, UA: POSITIVE — AB
Protein Ur, POC: NEGATIVE mg/dL
Spec Grav, UA: 1.01 (ref 1.010–1.025)
Urobilinogen, UA: 0.2 E.U./dL
pH, UA: 6 (ref 5.0–8.0)

## 2022-12-17 LAB — POCT UA - MICROSCOPIC ONLY: RBC, Urine, Miroscopic: NONE SEEN (ref 0–2)

## 2022-12-17 MED ORDER — POLYETHYLENE GLYCOL 3350 17 GM/SCOOP PO POWD
17.0000 g | Freq: Two times a day (BID) | ORAL | 1 refills | Status: DC | PRN
Start: 2022-12-17 — End: 2023-10-11

## 2022-12-17 MED ORDER — OMEPRAZOLE 40 MG PO CPDR
40.0000 mg | DELAYED_RELEASE_CAPSULE | Freq: Every day | ORAL | 3 refills | Status: DC
Start: 2022-12-17 — End: 2023-10-11

## 2022-12-17 NOTE — Assessment & Plan Note (Addendum)
Ongoing since prior cholecystectomy 5 years ago, has seen [facility] in the past-appears patient was scheduled for EGD but canceled.  Benign abdominal exam.  UA positive for nitrates and leukocytes, however asymptomatic-will culture and patient informed by phone.  UTI is on differential however do not believe this is related to her RUQ at this time-will await culture. Based on symptoms above differential includes: PUD, GERD, functional dyspepsia, gastroparesis, constipation. - Start omeprazole 40 mg daily - Start MiraLAX twice daily as needed - Recommend avoiding foods that cause increased pain, including alcohol and avoidance of NSAIDs - Referral sent to [facility]

## 2022-12-17 NOTE — Patient Instructions (Signed)
It was great to see you! Thank you for allowing me to participate in your care!   I recommend that you always bring your medications to each appointment as this makes it easy to ensure we are on the correct medications and helps [place] not miss when refills are needed.  Our plans for today:  -Please take 40 mg of Prilosec (omeprazole) once a day -Please take MiraLAX twice a day as needed for constipation.  I recommend you continue to take MiraLAX until you have soft and formed stools.  If you have diarrhea, discontinue MiraLAX. -I have sent a referral to GI   Take care and seek immediate care sooner if you develop any concerns. Please remember to show up 15 minutes before your scheduled appointment time!  Tiffany Kocher, DO [facility]

## 2022-12-17 NOTE — Progress Notes (Signed)
    SUBJECTIVE:   CHIEF COMPLAINT / HPI:   RUQ pain Intermittent right upper quadrant pain for many years, however typically will go away.  Current pain is the worst it has been.  Pain is worse with eating.  Status postcholecystectomy 5 years ago, not for stones.  She is having increased nausea but has not vomited.  Denies blood in stool.  Has history of GERD.  Also notes that when she drinks alcohol pain is worse, endorses only 3 beers per week.  Does not smoke.  Also endorsing constipation, history of chronic constipation.  Last saw GI 08/07/2022.  At that time [facility] was considering EGD, and I think this would be reasonable.  Not currently taking MiraLAX nor Prilosec.  No fevers, no systemic symptoms.  Denies dysuria or hematuria, but does have history of UTIs and a prior pyelonephritis.   PERTINENT  PMH / PSH: Fatty liver disease, prediabetes, obesity, food allergy  OBJECTIVE:   BP 130/72   Pulse 64   Ht 5\' 2"  (1.575 m)   Wt 177 lb 2 oz (80.3 kg)   LMP 11/21/2022   SpO2 100%   BMI 32.40 kg/m    General: NAD, pleasant Cardio: RRR, no MRG. Respiratory: CTAB, normal wob on RA GI: Abdomen is soft, not distended. BS present. Tenderness to palpation over RUQ. No radiation of pain. No tenderness in other quadrants. Skin: Warm and dry  ASSESSMENT/PLAN:   Chronic right upper quadrant pain Ongoing since prior cholecystectomy 5 years ago, has seen [facility] in the past-appears patient was scheduled for EGD but canceled.  Benign abdominal exam.  UA positive for nitrates and leukocytes, however asymptomatic-will culture and patient informed by phone.  UTI is on differential however do not believe this is related to her RUQ at this time-will await culture. Based on symptoms above differential includes: PUD, GERD, functional dyspepsia, gastroparesis, constipation. - Start omeprazole 40 mg daily - Start MiraLAX twice daily as needed - Recommend avoiding foods that cause increased pain,  including alcohol and avoidance of NSAIDs - Referral sent to [facility]   Tiffany Kocher, DO [facility]

## 2022-12-20 ENCOUNTER — Telehealth: Payer: Self-pay | Admitting: Student

## 2022-12-20 DIAGNOSIS — N3000 Acute cystitis without hematuria: Secondary | ICD-10-CM

## 2022-12-20 MED ORDER — NITROFURANTOIN MONOHYD MACRO 100 MG PO CAPS
100.0000 mg | ORAL_CAPSULE | Freq: Two times a day (BID) | ORAL | 0 refills | Status: DC
Start: 2022-12-20 — End: 2023-01-19

## 2022-12-20 NOTE — Telephone Encounter (Signed)
Called patient, confirmed identity.  Urine culture showing 100,000 gram-negative rods.  Patient denies fever, but has developed dysuria (did not have dysuria last visit).  Abdominal pain is stable.  She does endorse some vertigo symptoms, which she states she has had before-thinks she is dehydrated.  She has not fallen, not lost consciousness, no shortness of breath, no difficulty hearing and denies NVD.  She is alert and oriented x 4 by phone.  Sent prescription for Macrobid for acute uncomplicated cystitis.  Discussed return precautions including ED precautions as patient has history of pyelonephritis.  Encouraged patient to stay hydrated and rise from sitting to standing position slowly for vertigo symptoms-and encouraged her to schedule appointment for further evaluation if it does not improve or worsens.

## 2022-12-27 ENCOUNTER — Ambulatory Visit (INDEPENDENT_AMBULATORY_CARE_PROVIDER_SITE_OTHER): Payer: Self-pay | Admitting: Orthopedic Surgery

## 2022-12-27 DIAGNOSIS — M25531 Pain in right wrist: Secondary | ICD-10-CM

## 2022-12-27 DIAGNOSIS — M67431 Ganglion, right wrist: Secondary | ICD-10-CM

## 2022-12-27 NOTE — Progress Notes (Signed)
[name] - 42 y.o. female MRN [id number]  Date of birth: 1981-03-09  Office Visit Note: Visit Date: 12/27/2022 PCP: Fortunato Curling, DO Referred by: Fortunato Curling, DO  Subjective: Chief Complaint  Patient presents with   Right Wrist - Pain, Cyst   HPI: [name] is a pleasant 42 y.o. female who presents today for evaluation of ongoing right wrist pain with associated numbness and tingling.  She does describe nocturnal symptoms of numbness and tingling on the radial aspect of the hand.  She has a history of a prior right wrist volar cyst excision in January of this year by Dr. Roda Shutters.  Visit Reason: Right wrist; volar cyst Hand dominance: right Occupation: Manager @ [employer] Diabetic: No / 6.3  Prior Testing: MRI 04/2022 Injections: none Treatments: surgery, brace Prior Surgery: 1/3/24Roda Shutters   *surgery helped for 3-4 months*  Pertinent ROS were reviewed with the patient and found to be negative unless otherwise specified above in HPI.   Assessment & Plan: Visit Diagnoses:  1. Pain in right wrist   2. Ganglion cyst of dorsum of right wrist     Plan: Extensive discussion was had with patient today regarding her right wrist.  Clinically, she does have evidence of mild recurrence of the right volar ganglion cyst, however I am more concerned about her numbness and tingling which is progressing.  This numbness is activity related and it also does have a nocturnal component as well.  For this reason, like to investigate this with the right upper extremity EMG to rule out potential nerve compression.  We discussed at length her volar ganglion cyst.  I did once again reiterate the possibility for recurrence as well as the vascular risk associated with revision surgery in this region.  I reviewed her prior operative notes which did discuss entanglement of the arterial tissue with the cyst in question.  For this reason, would be extremely cautious about any kind of revision surgery in  this region to help mitigate potential risks for recurrence.  Referral for the EMG was placed today, she will return to me once is completed to review results.  Patient expressed understanding today.  Greater than 45 minutes was spent in the care of this patient.  Follow-up: No follow-ups on file.   Meds & Orders: No orders of the defined types were placed in this encounter.   Orders Placed This Encounter  Procedures   Ambulatory referral to Physical Medicine Rehab     Procedures: No procedures performed      Clinical History: No specialty comments available.  She reports that she has never smoked. She has never been exposed to tobacco smoke. She has never used smokeless tobacco.  Recent Labs    05/04/22 1053 11/09/22 0842  HGBA1C 6.3* 6.4    Objective:   Vital Signs: LMP 11/21/2022   Physical Exam  Gen: Well-appearing, in no acute distress; non-toxic CV: Regular Rate. Well-perfused. Warm.  Resp: Breathing unlabored on room air; no wheezing. Psych: Fluid speech in conversation; appropriate affect; normal thought process Neuro: Sensation intact throughout. No gross coordination deficits.   Ortho Exam PHYSICAL EXAM:  General: Patient is well appearing and in no distress. Cervical spine mobility is full in all directions:  Skin and Muscle: Prior surgical incision over the volar radial aspect of the right wrist, well-healed.      Range of Motion and Palpation Tests: Mobility is full about the elbows with flexion and extension.  Forearm supination and  pronation are 85/85 bilaterally.  Wrist flexion/extension is 75/65 bilaterally.  Digital flexion and extension are full.  Thumb opposition is full to the base of the small fingers bilaterally.    No cords or nodules are palpated.  No triggering is observed.    Mild right-sided tenderness over the thumb CMC articulation is observed.   Finklestein test is negative bilaterally.  Ulnar impingement test is negative bilaterally.   No evidence of radiocarpal, midcarpal or intercarpal joint instability with provocation.  Neurologic, Vascular, Motor: Sensation is slightly diminished to light touch in the median/radial distributions right side compared to left.  Tinel's testing positive right carpal tunnel.  Phalen's positive right, Derkan's compression mildly positive right  Fingers pink and well perfused.  Capillary refill is brisk.      Lab Results  Component Value Date   HGBA1C 6.4 11/09/2022     Imaging: No results found.  Past Medical/Family/Surgical/Social History: Medications & Allergies reviewed per EMR, new medications updated. 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   Cystic thyroid nodule 07/03/2014   Prediabetes 04/02/2014   Seasonal allergies 10/24/2012   Multiple food allergies 10/24/2012   Obesity, unspecified 07/23/2012   Chronic right upper quadrant pain 02/02/2012   Fatty liver disease, nonalcoholic 09/18/2009   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   Past Medical History:  Diagnosis Date   Abdominal pain 03/29/2021   Acute non-recurrent maxillary sinusitis 10/09/2022   Allergic reaction 06/07/2021   Angio-edema    ANXIETY 12/31/2008   Qualifier: Diagnosis of  By: Georgiana Shore  MD, Vernona Rieger     Breast mass in female 10/06/2017   Complication of anesthesia    Problem waking up once and a headache   Condyloma acuminatum 07/21/2006   Qualifier: History of  By: Lafonda Mosses MD, Darl Pikes     Depression    Diabetes mellitus without  complication (HCC)    Dyspnea 04/20/2022   Eczema    Family history of anesthesia complication    Mother had problem waking up   Hx gestational diabetes    Hypochondriasis 08/19/2014   She has multiple somatic complaints, and preoccupation with disease processes.   Laryngitis 02/15/2020   Menorrhagia 07/03/2014   Molluscum contagiosum infection 12/01/2018   Muscle strain of lower leg, right, initial encounter 03/05/2019   Pain in right wrist 11/03/2021   Right lower quadrant abdominal pain 05/07/2022   Spinal headache    Thyroid fullness 04/02/2014   Upper respiratory tract infection 04/20/2022   Urticaria    Vaginal lesion 11/13/2020   Voice hoarseness 05/22/2020   Family History  Problem Relation Age of Onset   Hypertension Mother    Hyperlipidemia Mother    Cervical cancer Mother    Diabetes Father    Hypertension Father    Irregular heart beat Father    Breast cancer Paternal Aunt    Throat cancer Maternal Grandmother    Prostate cancer Maternal Grandfather    Diabetes Other    Allergic rhinitis Daughter    Allergic rhinitis Daughter  Allergic rhinitis Son        milk   Stomach cancer Neg Hx    Colon cancer Neg Hx    Esophageal cancer Neg Hx    Pancreatic cancer Neg Hx    Past Surgical History:  Procedure Laterality Date   CESAREAN SECTION     CHOLECYSTECTOMY N/A 09/06/2013   Procedure: LAPAROSCOPIC CHOLECYSTECTOMY;  Surgeon: Shelly Rubenstein, MD;  Location: [facility] OR;  Service: General;  Laterality: N/A;   GANGLION CYST EXCISION Right 08/03/2021   Procedure: RIGHT REMOVAL GANGLION OF WRIST;  Surgeon: Marlyne Beards, MD;  Location: [facility];  Service: Orthopedics;  Laterality: Right;  regional with monitored anesthesia care   GANGLION CYST EXCISION Right 05/26/2022   Procedure: RIGHT WRIST VOLAR GANGLION CYST REMOVAL;  Surgeon: Tarry Kos, MD;  Location: [facility];  Service: Orthopedics;  Laterality: Right;   NASAL SINUS  SURGERY  2003   TUBAL LIGATION     Social History   Occupational History   Not on file  Tobacco Use   Smoking status: Never    Passive exposure: Never   Smokeless tobacco: Never  Vaping Use   Vaping status: Never Used  Substance and Sexual Activity   Alcohol use: Yes    Comment: occ.   Drug use: No   Sexual activity: Yes    Birth control/protection: Surgical    [name]) [name], M.D. [facility] 9:47 AM

## 2022-12-31 ENCOUNTER — Telehealth: Payer: Self-pay | Admitting: Physical Medicine and Rehabilitation

## 2022-12-31 NOTE — Telephone Encounter (Signed)
LVM to return call to schedule injection 

## 2022-12-31 NOTE — Telephone Encounter (Signed)
Patient called asked for a call back to schedule an appointment with Dr. Alvester Morin   The number to contact patient is  [phone]

## 2023-01-07 ENCOUNTER — Ambulatory Visit (INDEPENDENT_AMBULATORY_CARE_PROVIDER_SITE_OTHER): Payer: Self-pay | Admitting: Student

## 2023-01-07 ENCOUNTER — Ambulatory Visit (INDEPENDENT_AMBULATORY_CARE_PROVIDER_SITE_OTHER): Payer: Self-pay | Admitting: Physical Medicine and Rehabilitation

## 2023-01-07 ENCOUNTER — Encounter: Payer: Self-pay | Admitting: Student

## 2023-01-07 VITALS — BP 119/66 | HR 66 | Ht 62.0 in | Wt 178.0 lb

## 2023-01-07 DIAGNOSIS — N9089 Other specified noninflammatory disorders of vulva and perineum: Secondary | ICD-10-CM

## 2023-01-07 DIAGNOSIS — R202 Paresthesia of skin: Secondary | ICD-10-CM

## 2023-01-07 DIAGNOSIS — G8929 Other chronic pain: Secondary | ICD-10-CM

## 2023-01-07 DIAGNOSIS — R1011 Right upper quadrant pain: Secondary | ICD-10-CM

## 2023-01-07 DIAGNOSIS — N898 Other specified noninflammatory disorders of vagina: Secondary | ICD-10-CM

## 2023-01-07 DIAGNOSIS — M25511 Pain in right shoulder: Secondary | ICD-10-CM

## 2023-01-07 DIAGNOSIS — M542 Cervicalgia: Secondary | ICD-10-CM

## 2023-01-07 DIAGNOSIS — M25531 Pain in right wrist: Secondary | ICD-10-CM

## 2023-01-07 DIAGNOSIS — M67431 Ganglion, right wrist: Secondary | ICD-10-CM

## 2023-01-07 LAB — POCT WET PREP (WET MOUNT)
Clue Cells Wet Prep Whiff POC: NEGATIVE
Trichomonas Wet Prep HPF POC: ABSENT
WBC, Wet Prep HPF POC: >20

## 2023-01-07 NOTE — Progress Notes (Signed)
Functional Pain Scale - descriptive words and definitions  Moderate (4)   Constantly aware of pain, can complete ADLs with modification/sleep marginally affected at times/passive distraction is of no use, but active distraction gives some relief. Moderate range order  Average Pain 4-5  Right hand pain on top of hand. Numbness in thumb that can radiate up to the neck. Hard to lift things with just the right hand

## 2023-01-07 NOTE — Procedures (Signed)
EMG & NCV Findings: All nerve conduction studies (as indicated in the following tables) were within normal limits.    All examined muscles (as indicated in the following table) showed no evidence of electrical instability.    Impression: Essentially NORMAL electrodiagnostic study of the right upper limb.  There is no significant electrodiagnostic evidence of nerve entrapment, brachial plexopathy or cervical radiculopathy.    As you know, purely sensory or demyelinating radiculopathies and chemical radiculitis may not be detected with this particular electrodiagnostic study.   **This electrodiagnostic study cannot rule out small fiber polyneuropathy and dysesthesias from central pain syndromes such as stroke or central pain sensitization syndromes such as fibromyalgia.  Myotomal referral pain from trigger points is also not excluded.  Recommendations: 1.  Follow-up with referring physician. 2.  Continue current management of symptoms.  ___________________________ Naaman Plummer FAAPMR Board Certified, American Board of Physical Medicine and Rehabilitation    Nerve Conduction Studies Anti Sensory Summary Table   Stim Site NR Peak (ms) Norm Peak (ms) P-T Amp (V) Norm P-T Amp Site1 Site2 Delta-P (ms) Dist (cm) Vel (m/s) Norm Vel (m/s)  Right Median Acr Palm Anti Sensory (2nd Digit)  31.2C  Wrist    3.6 <3.6 49.6 >10 Wrist Palm 1.9 0.0    Palm    1.7 <2.0 26.4         Right Radial Anti Sensory (Base 1st Digit)  31.3C  Wrist    1.9 <3.1 65.9  Wrist Base 1st Digit 1.9 0.0    Right Ulnar Anti Sensory (5th Digit)  31.7C  Wrist    3.0 <3.7 32.7 >15.0 Wrist 5th Digit 3.0 14.0 47 >38   Motor Summary Table   Stim Site NR Onset (ms) Norm Onset (ms) O-P Amp (mV) Norm O-P Amp Site1 Site2 Delta-0 (ms) Dist (cm) Vel (m/s) Norm Vel (m/s)  Right Median Motor (Abd Poll Brev)  31.2C  Wrist    3.7 <4.2 6.5 >5 Elbow Wrist 4.0 22.0 55 >50  Elbow    7.7  6.1         Right Ulnar Motor (Abd Dig Min)   31.4C  Wrist    2.4 <4.2 10.6 >3 B Elbow Wrist 3.7 20.5 55 >53  B Elbow    6.1  11.1  A Elbow B Elbow 1.0 11.0 110 >53  A Elbow    7.1  10.8          EMG   Side Muscle Nerve Root Ins Act Fibs Psw Amp Dur Poly Recrt Int Dennie Bible Comment  Right Abd Poll Brev Median C8-T1 Nml Nml Nml Nml Nml 0 Nml Nml   Right 1stDorInt Ulnar C8-T1 Nml Nml Nml Nml Nml 0 Nml Nml   Right PronatorTeres Median C6-7 Nml Nml Nml Nml Nml 0 Nml Nml   Right Biceps Musculocut C5-6 Nml Nml Nml Nml Nml 0 Nml Nml   Right Deltoid Axillary C5-6 Nml Nml Nml Nml Nml 0 Nml Nml     Nerve Conduction Studies Anti Sensory Left/Right Comparison   Stim Site L Lat (ms) R Lat (ms) L-R Lat (ms) L Amp (V) R Amp (V) L-R Amp (%) Site1 Site2 L Vel (m/s) R Vel (m/s) L-R Vel (m/s)  Median Acr Palm Anti Sensory (2nd Digit)  31.2C  Wrist  3.6   49.6  Wrist Palm     Palm  1.7   26.4        Radial Anti Sensory (Base 1st Digit)  31.3C  Wrist  1.9  65.9  Wrist Base 1st Digit     Ulnar Anti Sensory (5th Digit)  31.7C  Wrist  3.0   32.7  Wrist 5th Digit  47    Motor Left/Right Comparison   Stim Site L Lat (ms) R Lat (ms) L-R Lat (ms) L Amp (mV) R Amp (mV) L-R Amp (%) Site1 Site2 L Vel (m/s) R Vel (m/s) L-R Vel (m/s)  Median Motor (Abd Poll Brev)  31.2C  Wrist  3.7   6.5  Elbow Wrist  55   Elbow  7.7   6.1        Ulnar Motor (Abd Dig Min)  31.4C  Wrist  2.4   10.6  B Elbow Wrist  55   B Elbow  6.1   11.1  A Elbow B Elbow  110   A Elbow  7.1   10.8           Waveforms:

## 2023-01-07 NOTE — Patient Instructions (Addendum)
It was great to see you today! Thank you for choosing [facility] for your primary care.  Today we addressed: We will get lab work and get an ultrasound  Return 3-4 weeks   If you haven't already, sign up for My Chart to have easy access to your labs results, and communication with your primary care physician. I recommend that you always bring your medications to each appointment as this makes it easy to ensure you are on the correct medications and helps [place] not miss refills when you need them. Call the clinic at [phone] if your symptoms worsen or you have any concerns. Return in about 3 weeks (around 01/28/2023). Please arrive 15 minutes before your appointment to ensure smooth check in process.  We appreciate your efforts in making this happen.  Thank you for allowing me to participate in your care, Alfredo Martinez, MD 01/07/2023, 3:01 PM PGY-3, [facility]

## 2023-01-07 NOTE — Progress Notes (Unsigned)
  SUBJECTIVE:   CHIEF COMPLAINT / HPI:   ***  PERTINENT  PMH / PSH: ***  Patient Care Team: Fortunato Curling, DO as PCP - General OBJECTIVE:  LMP 11/21/2022  Physical Exam   ASSESSMENT/PLAN:  There are no diagnoses linked to this encounter. No follow-ups on file. Janice Martinez, MD 01/07/2023, 12:56 PM PGY-3, [facility] {    This will disappear when note is signed, click to select method of visit    :1}

## 2023-01-07 NOTE — Progress Notes (Signed)
[name] - 42 y.o. female MRN [id number]  Date of birth: 1980-06-19  Office Visit Note: Visit Date: 01/07/2023 PCP: Fortunato Curling, DO Referred by: Samuella Cota, MD  Subjective: Chief Complaint  Patient presents with   Right Hand - Pain, Numbness, Weakness   HPI:  [name] is a 42 y.o. female who comes in today at the request of Dr. Bonner Puna for evaluation and management of chronic, worsening and severe pain, numbness and tingling in the Right upper extremities.  Patient is Right hand dominant.  She reports worsening pain on the dorsum of the right hand with some referral up to the shoulder and neck.  She does get paresthesia tingling in the radial type digits as well.  Her history is such that she had ganglion cyst removed by Dr. Roda Shutters on the volar part of the wrist.  Has been some reoccurrence and she saw Dr. Nydia Bouton.  She reports no real left-sided complaints.  She is prediabetic with hemoglobin A1c's in the 6.3-6.4 range.  No history of thyroid disease but has had cystic thyroid nodule and pituitary adenoma.  She denies any numbness or tingling in the feet.  She feels like the symptoms in the neck shoulder and arm are coming from her cervical spine.  She did have a CT scan performed in January of the cervical spine which was fairly unrevealing.  She has not had MRI of the cervical spine.  She endorses some weakness overall.  She does not carry a diagnosis of fibromyalgia.   I spent more than 30 minutes speaking face-to-face with the patient with 50% of the time in counseling and discussing coordination of care.    Review of Systems  Musculoskeletal:  Positive for joint pain and neck pain.  Neurological:  Positive for tingling and weakness.  All other systems reviewed and are negative.  Otherwise per HPI.  Assessment & Plan: Visit Diagnoses:    ICD-10-CM   1. Paresthesia of skin  R20.2 NCV with EMG (electromyography)    2. Cervicalgia  M54.2     3. Chronic right  shoulder pain  M25.511    G89.29     4. Pain in right wrist  M25.531     5. Ganglion cyst of volar aspect of right wrist  M67.431       Plan: Impression: Somewhat complicated clinical pattern of hand pain with paresthesias on the dorsum of the hand more than palmar but with prior volar wrist excision and also history of neck spasm and neck pain with shoulder pain.  Differential diagnosis is cervical radiculopathy or median nerve neuropathy at the wrist with some concern for underlying central sensitization pain syndrome.  Electrodiagnostic study performed today.  Essentially NORMAL electrodiagnostic study of the right upper limb.  There is no significant electrodiagnostic evidence of nerve entrapment, brachial plexopathy or cervical radiculopathy.    As you know, purely sensory or demyelinating radiculopathies and chemical radiculitis may not be detected with this particular electrodiagnostic study.   **This electrodiagnostic study cannot rule out small fiber polyneuropathy and dysesthesias from central pain syndromes such as stroke or central pain sensitization syndromes such as fibromyalgia.  Myotomal referral pain from trigger points is also not excluded.  Recommendations: 1.  Follow-up with referring physician. 2.  Continue current management of symptoms.  Meds & Orders: No orders of the defined types were placed in this encounter.   Orders Placed This Encounter  Procedures   NCV with EMG (electromyography)  Follow-up: Return for Anshul Argarwala, MD.   Procedures: No procedures performed  EMG & NCV Findings: All nerve conduction studies (as indicated in the following tables) were within normal limits.    All examined muscles (as indicated in the following table) showed no evidence of electrical instability.    Impression: Essentially NORMAL electrodiagnostic study of the right upper limb.  There is no significant electrodiagnostic evidence of nerve entrapment, brachial  plexopathy or cervical radiculopathy.    As you know, purely sensory or demyelinating radiculopathies and chemical radiculitis may not be detected with this particular electrodiagnostic study.   **This electrodiagnostic study cannot rule out small fiber polyneuropathy and dysesthesias from central pain syndromes such as stroke or central pain sensitization syndromes such as fibromyalgia.  Myotomal referral pain from trigger points is also not excluded.  Recommendations: 1.  Follow-up with referring physician. 2.  Continue current management of symptoms.  ___________________________ Naaman Plummer FAAPMR Board Certified, American Board of Physical Medicine and Rehabilitation    Nerve Conduction Studies Anti Sensory Summary Table   Stim Site NR Peak (ms) Norm Peak (ms) P-T Amp (V) Norm P-T Amp Site1 Site2 Delta-P (ms) Dist (cm) Vel (m/s) Norm Vel (m/s)  Right Median Acr Palm Anti Sensory (2nd Digit)  31.2C  Wrist    3.6 <3.6 49.6 >10 Wrist Palm 1.9 0.0    Palm    1.7 <2.0 26.4         Right Radial Anti Sensory (Base 1st Digit)  31.3C  Wrist    1.9 <3.1 65.9  Wrist Base 1st Digit 1.9 0.0    Right Ulnar Anti Sensory (5th Digit)  31.7C  Wrist    3.0 <3.7 32.7 >15.0 Wrist 5th Digit 3.0 14.0 47 >38   Motor Summary Table   Stim Site NR Onset (ms) Norm Onset (ms) O-P Amp (mV) Norm O-P Amp Site1 Site2 Delta-0 (ms) Dist (cm) Vel (m/s) Norm Vel (m/s)  Right Median Motor (Abd Poll Brev)  31.2C  Wrist    3.7 <4.2 6.5 >5 Elbow Wrist 4.0 22.0 55 >50  Elbow    7.7  6.1         Right Ulnar Motor (Abd Dig Min)  31.4C  Wrist    2.4 <4.2 10.6 >3 B Elbow Wrist 3.7 20.5 55 >53  B Elbow    6.1  11.1  A Elbow B Elbow 1.0 11.0 110 >53  A Elbow    7.1  10.8          EMG   Side Muscle Nerve Root Ins Act Fibs Psw Amp Dur Poly Recrt Int Dennie Bible Comment  Right Abd Poll Brev Median C8-T1 Nml Nml Nml Nml Nml 0 Nml Nml   Right 1stDorInt Ulnar C8-T1 Nml Nml Nml Nml Nml 0 Nml Nml   Right PronatorTeres Median  C6-7 Nml Nml Nml Nml Nml 0 Nml Nml   Right Biceps Musculocut C5-6 Nml Nml Nml Nml Nml 0 Nml Nml   Right Deltoid Axillary C5-6 Nml Nml Nml Nml Nml 0 Nml Nml     Nerve Conduction Studies Anti Sensory Left/Right Comparison   Stim Site L Lat (ms) R Lat (ms) L-R Lat (ms) L Amp (V) R Amp (V) L-R Amp (%) Site1 Site2 L Vel (m/s) R Vel (m/s) L-R Vel (m/s)  Median Acr Palm Anti Sensory (2nd Digit)  31.2C  Wrist  3.6   49.6  Wrist Palm     Palm  1.7   26.4  Radial Anti Sensory (Base 1st Digit)  31.3C  Wrist  1.9   65.9  Wrist Base 1st Digit     Ulnar Anti Sensory (5th Digit)  31.7C  Wrist  3.0   32.7  Wrist 5th Digit  47    Motor Left/Right Comparison   Stim Site L Lat (ms) R Lat (ms) L-R Lat (ms) L Amp (mV) R Amp (mV) L-R Amp (%) Site1 Site2 L Vel (m/s) R Vel (m/s) L-R Vel (m/s)  Median Motor (Abd Poll Brev)  31.2C  Wrist  3.7   6.5  Elbow Wrist  55   Elbow  7.7   6.1        Ulnar Motor (Abd Dig Min)  31.4C  Wrist  2.4   10.6  B Elbow Wrist  55   B Elbow  6.1   11.1  A Elbow B Elbow  110   A Elbow  7.1   10.8           Waveforms:             Clinical History: CT CERVICAL SPINE WITHOUT CONTRAST   TECHNIQUE: Multidetector CT imaging of the cervical spine was performed without intravenous contrast. Multiplanar CT image reconstructions were also generated.   RADIATION DOSE REDUCTION: This exam was performed according to the departmental dose-optimization program which includes automated exposure control, adjustment of the mA and/or kV according to patient size and/or use of iterative reconstruction technique.   COMPARISON:  None Available.   FINDINGS: Alignment: There is mild reversal of the normal cervical spine lordosis.   Skull base and vertebrae: No acute fracture. No primary bone lesion or focal pathologic process.   Soft tissues and spinal canal: No prevertebral fluid or swelling. No visible canal hematoma.   Disc levels: Normal multilevel endplates  are seen with normal multilevel intervertebral disc spaces.   Normal, bilateral multilevel facet joints are noted.   Upper chest: Negative.   Other: None.   IMPRESSION: 1. No acute fracture or subluxation in the cervical spine. 2. Mild reversal of the normal cervical spine lordosis, which may be due to positioning or muscle spasm.     Electronically Signed   By: Aram Candela M.D.   On: 06/12/2022 21:23     Objective:  VS:  HT:    WT:   BMI:     BP:   HR: bpm  TEMP: ( )  RESP:  Physical Exam Vitals and nursing note reviewed.  Constitutional:      General: She is not in acute distress.    Appearance: Normal appearance. She is well-developed. She is not ill-appearing.  HENT:     Head: Normocephalic and atraumatic.  Eyes:     Conjunctiva/sclera: Conjunctivae normal.     Pupils: Pupils are equal, round, and reactive to light.  Cardiovascular:     Rate and Rhythm: Normal rate.     Pulses: Normal pulses.  Pulmonary:     Effort: Pulmonary effort is normal.  Musculoskeletal:        General: No swelling, tenderness or deformity.     Right lower leg: No edema.     Left lower leg: No edema.     Comments: Inspection reveals well-healed surgical scar on the right volar wrist but no atrophy of the bilateral APB or FDI or hand intrinsics. There is no swelling, color changes, allodynia or dystrophic changes. There is 5 out of 5 strength in the bilateral wrist extension, finger abduction and long  finger flexion. There is intact sensation to light touch in all dermatomal and peripheral nerve distributions. There is a negative Phalen's test bilaterally. There is a negative Hoffmann's test bilaterally.  Skin:    General: Skin is warm and dry.     Findings: No erythema or rash.  Neurological:     General: No focal deficit present.     Mental Status: She is alert and oriented to person, place, and time.     Sensory: No sensory deficit.     Motor: No weakness or abnormal muscle  tone.     Coordination: Coordination normal.     Gait: Gait normal.  Psychiatric:        Mood and Affect: Mood normal.        Behavior: Behavior normal.      Imaging: No results found.

## 2023-01-07 NOTE — Progress Notes (Unsigned)
  SUBJECTIVE:   CHIEF COMPLAINT / HPI:   Right Sided Pain:  -ongoing for 1 month  -Has seen GI before -Pain worsening with eating  -No hematochezia   E.Coli in UA--Treated with Macrobid   Vaginal Discharge  Bump External Genital Region --onset 2 weeks ago  --right   Right Lower QuadrantPain  --  PERTINENT  PMH / PSH: ***  Past Medical History:  Diagnosis Date   Abdominal pain 03/29/2021   Acute non-recurrent maxillary sinusitis 10/09/2022   Allergic reaction 06/07/2021   Angio-edema    ANXIETY 12/31/2008   Qualifier: Diagnosis of  By: Georgiana Shore  MD, Vernona Rieger     Breast mass in female 10/06/2017   Complication of anesthesia    Problem waking up once and a headache   Condyloma acuminatum 07/21/2006   Qualifier: History of  By: Lafonda Mosses MD, Darl Pikes     Depression    Diabetes mellitus without complication [facility])    Dyspnea 04/20/2022   Eczema    Family history of anesthesia complication    Mother had problem waking up   Hx gestational diabetes    Hypochondriasis 08/19/2014   She has multiple somatic complaints, and preoccupation with disease processes.   Laryngitis 02/15/2020   Menorrhagia 07/03/2014   Molluscum contagiosum infection 12/01/2018   Muscle strain of lower leg, right, initial encounter 03/05/2019   Pain in right wrist 11/03/2021   Right lower quadrant abdominal pain 05/07/2022   Spinal headache    Thyroid fullness 04/02/2014   Upper respiratory tract infection 04/20/2022   Urticaria    Vaginal lesion 11/13/2020   Voice hoarseness 05/22/2020    Patient Care Team: Fortunato Curling, DO as PCP - General OBJECTIVE:  BP 119/66   Pulse 66   Ht 5\' 2"  (1.575 m)   Wt 178 lb (80.7 kg)   LMP 12/19/2022   SpO2 100%   BMI 32.56 kg/m  Physical Exam   ASSESSMENT/PLAN:  There are no diagnoses linked to this encounter. No follow-ups on file. Alfredo Martinez, MD 01/07/2023, 2:29 PM PGY-***, [facility] {    This will disappear when note is  signed, click to select method of visit    :1}

## 2023-01-08 ENCOUNTER — Encounter: Payer: Self-pay | Admitting: Student

## 2023-01-08 DIAGNOSIS — N898 Other specified noninflammatory disorders of vagina: Secondary | ICD-10-CM | POA: Insufficient documentation

## 2023-01-08 DIAGNOSIS — N9089 Other specified noninflammatory disorders of vulva and perineum: Secondary | ICD-10-CM

## 2023-01-08 HISTORY — DX: Other specified noninflammatory disorders of vulva and perineum: N90.89

## 2023-01-08 HISTORY — DX: Other specified noninflammatory disorders of vagina: N89.8

## 2023-01-08 LAB — LIPASE: Lipase: 28 U/L (ref 14–72)

## 2023-01-08 LAB — COMPREHENSIVE METABOLIC PANEL
ALT: 21 IU/L (ref 0–32)
AST: 16 IU/L (ref 0–40)
Albumin: 4.2 g/dL (ref 3.9–4.9)
Alkaline Phosphatase: 99 IU/L (ref 44–121)
BUN/Creatinine Ratio: 23 (ref 9–23)
BUN: 16 mg/dL (ref 6–24)
Bilirubin Total: 0.2 mg/dL (ref 0.0–1.2)
CO2: 23 mmol/L (ref 20–29)
Calcium: 9.3 mg/dL (ref 8.7–10.2)
Chloride: 100 mmol/L (ref 96–106)
Creatinine, Ser: 0.69 mg/dL (ref 0.57–1.00)
Globulin, Total: 2.6 g/dL (ref 1.5–4.5)
Glucose: 94 mg/dL (ref 70–99)
Potassium: 4.3 mmol/L (ref 3.5–5.2)
Sodium: 137 mmol/L (ref 134–144)
Total Protein: 6.8 g/dL (ref 6.0–8.5)
eGFR: 111 mL/min/{1.73_m2} (ref >59)

## 2023-01-08 LAB — RPR: RPR Ser Ql: NONREACTIVE

## 2023-01-08 LAB — URINALYSIS
Bilirubin, UA: NEGATIVE
Glucose, UA: NEGATIVE
Ketones, UA: NEGATIVE
Leukocytes,UA: NEGATIVE
Nitrite, UA: NEGATIVE
Protein,UA: NEGATIVE
RBC, UA: NEGATIVE
Specific Gravity, UA: 1.016 (ref 1.005–1.030)
Urobilinogen, Ur: 0.2 mg/dL (ref 0.2–1.0)
pH, UA: 6 (ref 5.0–7.5)

## 2023-01-08 NOTE — Assessment & Plan Note (Signed)
Vaginal discharge with pelvic pain and bloating sensation with change in menstrual cycle. Broad ddx including premenopausal symptoms, cysts/fibroids, cancerous lesions not excluded but not as likely. TVUS/pelvic U/S for further assessment, wet prep.

## 2023-01-08 NOTE — Assessment & Plan Note (Signed)
Check for syphilis with RPR although very likely to be repeat cyst.

## 2023-01-08 NOTE — Assessment & Plan Note (Signed)
Broad Ddx: MSK, liver, pancreatitis, urinary source/kidney (less likely without further symptoms). Discussed treatment plan which includes RUQ U/S?, CMP and lipase. Will obtain labs before considering imaging.

## 2023-01-10 ENCOUNTER — Ambulatory Visit (HOSPITAL_COMMUNITY): Admission: RE | Admit: 2023-01-10 | Payer: Self-pay | Source: Ambulatory Visit

## 2023-01-13 ENCOUNTER — Encounter: Payer: Self-pay | Admitting: Student

## 2023-01-17 ENCOUNTER — Ambulatory Visit: Payer: Self-pay | Admitting: Orthopedic Surgery

## 2023-01-19 ENCOUNTER — Telehealth (HOSPITAL_COMMUNITY): Payer: Self-pay | Admitting: *Deleted

## 2023-01-19 ENCOUNTER — Ambulatory Visit (HOSPITAL_COMMUNITY)
Admission: EM | Admit: 2023-01-19 | Discharge: 2023-01-19 | Disposition: A | Discharge status: Home / Self Care | Payer: Self-pay | Attending: Urgent Care | Admitting: Urgent Care

## 2023-01-19 ENCOUNTER — Encounter (HOSPITAL_COMMUNITY): Payer: Self-pay | Admitting: *Deleted

## 2023-01-19 DIAGNOSIS — U071 COVID-19: Secondary | ICD-10-CM

## 2023-01-19 MED ORDER — PAXLOVID (300/100) 20 X 150 MG & 10 X 100MG PO TBPK: 3.0000 | ORAL_TABLET | Freq: Two times a day (BID) | ORAL | 0 refills | Status: AC

## 2023-01-19 MED ORDER — PAXLOVID (300/100) 20 X 150 MG & 10 X 100MG PO TBPK: 3.0000 | ORAL_TABLET | Freq: Two times a day (BID) | ORAL | 0 refills | Status: DC

## 2023-01-19 NOTE — Discharge Instructions (Addendum)
You are positive for covid. Start paxlovid today; take 3 tabs twice daily for 5 days. Monitor for adverse reactions, most commonly nausea, vomiting, diarrhea or headache.  Rest and stay hydrated with water. Must quarantine until symptoms improve, wear N95 mask when out for 5 days after isolation period is completed. Please monitor regression of your symptoms. Some people have tried OTC Quercetin to help fight off illness. If any new or worsening symptoms develops, particularly uncontrollable fever, severe shortness of breath or chest pain, please head to the ER.

## 2023-01-19 NOTE — ED Provider Notes (Signed)
[facility]    CSN: [id number] Arrival date & time: 01/19/23  1930      History   Chief Complaint Chief Complaint  Patient presents with   Covid Positive   Covid Exposure   Chills   Dizziness    HPI [name] is a 42 y.o. female.   42yo female presents today due to two positive home covid tests. She states two days ago, she started having body chills and feeling cold. No fever. Over the past twenty four hours, developed significant nasal congestion, decreased smell/ taste, and feeling slightly lightheaded. No headache. No sore throat. No CP, SOB, palpitations or severe fatigue. Pt has had covid four other times, states hx of severe infections in the past. Has taken paxlovid without side effects. Risk factors include DM. Pt is requesting a confirmatory test for her work, they will not accept the home test.    Dizziness   Past Medical History:  Diagnosis Date   Abdominal pain 03/29/2021   Acute non-recurrent maxillary sinusitis 10/09/2022   Allergic reaction 06/07/2021   Angio-edema    ANXIETY 12/31/2008   Qualifier: Diagnosis of  By: Georgiana Shore  MD, Vernona Rieger     Breast mass in female 10/06/2017   Complication of anesthesia    Problem waking up once and a headache   Condyloma acuminatum 07/21/2006   Qualifier: History of  By: Lafonda Mosses MD, Darl Pikes     Depression    Diabetes mellitus without complication [facility])    Dyspnea 04/20/2022   Eczema    Family history of anesthesia complication    Mother had problem waking up   Hx gestational diabetes    Hypochondriasis 08/19/2014   She has multiple somatic complaints, and preoccupation with disease processes.   Laryngitis 02/15/2020   Menorrhagia 07/03/2014   Molluscum contagiosum infection 12/01/2018   Muscle strain of lower leg, right, initial encounter 03/05/2019   Pain in right wrist 11/03/2021   Right lower quadrant abdominal pain 05/07/2022   Spinal headache    Thyroid fullness 04/02/2014   Upper respiratory  tract infection 04/20/2022   Urticaria    Vaginal lesion 11/13/2020   Voice hoarseness 05/22/2020    Patient Active Problem List   Diagnosis Date Noted   Vaginal discharge 01/08/2023   Vulvar lesion 01/08/2023   Other fatigue 05/05/2022   Ganglion cyst of volar aspect of right wrist 04/30/2022   Ganglion cyst of dorsum of right wrist    Hyperlipidemia 05/20/2021   Dorsal wrist ganglion 05/20/2021   Flank pain 03/29/2021   Vision changes 03/01/2021   Hoarseness of voice 05/22/2020   Breast nodule 10/06/2017   Congenital vertical talus deformity, 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   Coalition, 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   Cystic thyroid nodule 07/03/2014   Prediabetes 04/02/2014   Seasonal allergies 10/24/2012   Multiple food allergies 10/24/2012   Obesity, unspecified 07/23/2012   RUQ pain 02/02/2012   Fatty liver disease, nonalcoholic 09/18/2009   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    Past Surgical History:  Procedure Laterality Date   CESAREAN SECTION     CHOLECYSTECTOMY N/A 09/06/2013   Procedure: LAPAROSCOPIC CHOLECYSTECTOMY;  Surgeon: Shelly Rubenstein, MD;  Location: [facility];  Service: General;  Laterality: N/A;   GANGLION CYST EXCISION Right 08/03/2021   Procedure: RIGHT REMOVAL GANGLION OF WRIST;  Surgeon: Marlyne Beards, MD;  Location: [facility];  Service: Orthopedics;  Laterality: Right;  regional with monitored anesthesia care  GANGLION CYST EXCISION Right 05/26/2022   Procedure: RIGHT WRIST VOLAR GANGLION CYST REMOVAL;  Surgeon: Tarry Kos, MD;  Location: [facility];  Service: Orthopedics;  Laterality: Right;   NASAL SINUS SURGERY  2003   TUBAL LIGATION      OB History     Gravida  3   Para      Term      Preterm      AB      Living  3      SAB      IAB      Ectopic      Multiple      Live Births  3            Home Medications    Prior to Admission medications    Medication Sig Start Date End Date Taking? Authorizing Provider  metFORMIN (GLUCOPHAGE-XR) 500 MG 24 hr tablet Take 2 tablets (1,000 mg total) by mouth daily with breakfast. 11/09/22  Yes Valetta Close, MD  omeprazole (PRILOSEC) 40 MG capsule Take 1 capsule (40 mg total) by mouth daily. 12/17/22  Yes Tiffany Kocher, DO  cetirizine (ZYRTEC) 10 MG tablet Take 1 tablet (10 mg total) by mouth daily. 06/05/21   Cathleen Corti, MD  fluticasone (FLONASE) 50 MCG/ACT nasal spray Place 2 sprays into both nostrils daily. 06/18/21   Marcelyn Bruins, MD  ketorolac (ACULAR) 0.5 % ophthalmic solution 1 drop 2 (two) times daily. 10/14/20   [provider]  nirmatrelvir & ritonavir (PAXLOVID, 300/100,) 20 x 150 MG & 10 x 100MG  TBPK Take 3 tablets by mouth 2 (two) times daily for 5 days. 01/19/23 01/24/23  Ailin Rochford L, PA  Olopatadine HCl (PATADAY) 0.2 % SOLN Place 1 drop into both eyes 2 (two) times daily as needed. 06/18/21   Marcelyn Bruins, MD  polyethylene glycol powder (GLYCOLAX/MIRALAX) 17 GM/SCOOP powder Take 17 g by mouth 2 (two) times daily as needed. 12/17/22   Tiffany Kocher, DO    Family History Family History  Problem Relation Age of Onset   Hypertension Mother    Hyperlipidemia Mother    Cervical cancer Mother    Diabetes Father    Hypertension Father    Irregular heart beat Father    Breast cancer Paternal Aunt    Throat cancer Maternal Grandmother    Prostate cancer Maternal Grandfather    Diabetes Other    Allergic rhinitis Daughter    Allergic rhinitis Daughter    Allergic rhinitis Son        milk   Stomach cancer Neg Hx    Colon cancer Neg Hx    Esophageal cancer Neg Hx    Pancreatic cancer Neg Hx     Social History Social History   Tobacco Use   Smoking status: Never    Passive exposure: Never   Smokeless tobacco: Never  Vaping Use   Vaping status: Never Used  Substance Use Topics   Alcohol use: Yes    Comment: occ.   Drug use:  No     Allergies   Amoxicillin, Penicillins, and Shellfish allergy   Review of Systems Review of Systems  Neurological:  Positive for dizziness.  As per HPI   Physical Exam Triage Vital Signs ED Triage Vitals  Encounter Vitals Group     BP 01/19/23 1954 (!) 156/90     Systolic BP Percentile --      Diastolic BP Percentile --  Pulse Rate 01/19/23 1954 96     Resp 01/19/23 1954 18     Temp 01/19/23 1954 98.5 F (36.9 C)     Temp Source 01/19/23 1954 Oral     SpO2 01/19/23 1954 98 %     Weight --      Height --      Head Circumference --      Peak Flow --      Pain Score 01/19/23 1952 0     Pain Loc --      Pain Education --      Exclude from Growth Chart --    No data found.  Updated Vital Signs BP (!) 156/90 (BP Location: Left Arm)   Pulse 96   Temp 98.5 F (36.9 C) (Oral)   Resp 18   LMP 01/18/2023 (Exact Date)   SpO2 98%   Visual Acuity Right Eye Distance:   Left Eye Distance:   Bilateral Distance:    Right Eye Near:   Left Eye Near:    Bilateral Near:     Physical Exam Vitals and nursing note reviewed.  Constitutional:      General: She is not in acute distress.    Appearance: Normal appearance. She is not toxic-appearing or diaphoretic.  HENT:     Head: Normocephalic.     Nose:     Comments: Mask in place Eyes:     General: No scleral icterus.       Right eye: No discharge.        Left eye: No discharge.     Extraocular Movements: Extraocular movements intact.     Pupils: Pupils are equal, round, and reactive to light.  Cardiovascular:     Rate and Rhythm: Normal rate and regular rhythm.     Heart sounds: No murmur heard.    No friction rub.  Pulmonary:     Effort: Pulmonary effort is normal. No respiratory distress.     Breath sounds: Normal breath sounds. No stridor. No wheezing, rhonchi or rales.  Chest:     Chest wall: No tenderness.  Musculoskeletal:     Cervical back: Normal range of motion and neck supple. No rigidity or  tenderness.  Lymphadenopathy:     Cervical: No cervical adenopathy.  Skin:    General: Skin is warm.     Findings: No rash.  Neurological:     General: No focal deficit present.     Mental Status: She is alert and oriented to person, place, and time.      UC Treatments / Results  Labs (all labs ordered are listed, but only abnormal results are displayed) Labs Reviewed  SARS CORONAVIRUS 2 (TAT 6-24 HRS)    EKG   Radiology No results found.  Procedures Procedures (including critical care time)  Medications Ordered in UC Medications - No data to display  Initial Impression / Assessment and Plan / UC Course  I have reviewed the triage vital signs and the nursing notes.  Pertinent labs & imaging results that were available during my care of the patient were reviewed by me and considered in my medical decision making (see chart for details).     Covid-19 - pt with two positive home tests today. Sx consistent with covid. Hx of complications from covid in the past and DM as risk factor. Pt requesting paxlovid - has taken in the past without ADRs. Recent GFR 01/07/23 111, normal creatinine. Pt needing confirmatory test today to provide to her  workplace. ER precautions reviewed.    Final Clinical Impressions(s) / UC Diagnoses   Final diagnoses:  COVID-19     Discharge Instructions      You are positive for covid. Start paxlovid today; take 3 tabs twice daily for 5 days. Monitor for adverse reactions, most commonly nausea, vomiting, diarrhea or headache.  Rest and stay hydrated with water. Must quarantine until symptoms improve, wear N95 mask when out for 5 days after isolation period is completed. Please monitor regression of your symptoms. Some people have tried OTC Quercetin to help fight off illness. If any new or worsening symptoms develops, particularly uncontrollable fever, severe shortness of breath or chest pain, please head to the ER.     ED Prescriptions      Medication Sig Dispense Auth. Provider   nirmatrelvir & ritonavir (PAXLOVID, 300/100,) 20 x 150 MG & 10 x 100MG  TBPK Take 3 tablets by mouth 2 (two) times daily for 5 days. 30 tablet Stokes Rattigan L, [place]      PDMP not reviewed this encounter.   Maretta Bees, [place] 01/19/23 2042

## 2023-01-19 NOTE — ED Triage Notes (Signed)
Pt states she has had congestion, chills, and lightheadedness x 2 days. She took 2 at home COVID test today and they were both positive. She hasn't taken any meds today since she had to work. She did take nyquil last night.   She needs a note for work and it should include day she can return.

## 2023-01-20 ENCOUNTER — Telehealth: Payer: Self-pay

## 2023-01-20 ENCOUNTER — Other Ambulatory Visit: Payer: Self-pay

## 2023-01-20 DIAGNOSIS — N63 Unspecified lump in unspecified breast: Secondary | ICD-10-CM

## 2023-01-20 LAB — SARS CORONAVIRUS 2 (TAT 6-24 HRS): SARS Coronavirus 2: POSITIVE — AB

## 2023-01-20 NOTE — Telephone Encounter (Signed)
 Returned patient's telephone call. Left a voice message with [facility] contact information.

## 2023-02-02 ENCOUNTER — Ambulatory Visit: Payer: Self-pay | Admitting: Orthopedic Surgery

## 2023-02-07 ENCOUNTER — Telehealth: Payer: Self-pay

## 2023-02-07 ENCOUNTER — Ambulatory Visit: Payer: Self-pay | Admitting: Student

## 2023-02-07 NOTE — Telephone Encounter (Signed)
Patient calls nurse line requesting assistance in scheduling pelvic ultrasound. She reports that she now has the orange card and needs this scheduled at imaging center that accepts this.   Called and spoke with [facility] network. Order will need to be faxed to Judeth Cornfield (caseworker) at [phone].  Called and advised patient that she would be being contacted by Judeth Cornfield in regards to scheduling this appointment.   Veronda Prude, RN

## 2023-02-17 ENCOUNTER — Ambulatory Visit
Admission: RE | Admit: 2023-02-17 | Discharge: 2023-02-17 | Disposition: A | Discharge status: Home / Self Care | Payer: Self-pay | Source: Ambulatory Visit | Attending: Obstetrics and Gynecology | Admitting: Obstetrics and Gynecology

## 2023-02-17 ENCOUNTER — Ambulatory Visit: Payer: Self-pay | Admitting: Hematology and Oncology

## 2023-02-17 ENCOUNTER — Ambulatory Visit
Admission: RE | Admit: 2023-02-17 | Discharge: 2023-02-17 | Disposition: A | Discharge status: Home / Self Care | Payer: No Typology Code available for payment source | Source: Ambulatory Visit | Attending: Obstetrics and Gynecology | Admitting: Obstetrics and Gynecology

## 2023-02-17 ENCOUNTER — Other Ambulatory Visit: Payer: Self-pay | Admitting: Obstetrics and Gynecology

## 2023-02-17 VITALS — BP 122/86 | Wt 176.0 lb

## 2023-02-17 DIAGNOSIS — N63 Unspecified lump in unspecified breast: Secondary | ICD-10-CM

## 2023-02-17 NOTE — Patient Instructions (Signed)
Taught [name] about self breast awareness and gave educational materials to take home. Patient did not need a Pap smear today due to last Pap smear was 05/07/22 per patient.  Let her know [facility] will cover Pap smears every 5 years unless has a history of abnormal Pap smears. Referred patient to the [facility] for diagnostic mammogram. Appointment scheduled for 02/17/23. Patient aware of appointment and will be there. Let patient know will follow up with her within the next couple weeks with results. [name] verbalized understanding.  Pascal Lux, NP 11:16 AM

## 2023-02-17 NOTE — Progress Notes (Signed)
[name] is a 42 y.o. female who presents to [facility] clinic today with complaints of bilateral    Pap Smear: Pap not smear completed today. Last Pap smear was 05/07/2022 and was normal. Per patient has no history of an abnormal Pap smear. Last Pap smear result is available in Epic.   Physical exam: Breasts Breasts symmetrical. No skin abnormalities bilateral breasts. No nipple retraction bilateral breasts. No nipple discharge bilateral breasts. No lymphadenopathy. No lumps palpated bilateral breasts.   MS DIGITAL DIAG TOMO BILAT  Result Date: 11/25/2020 CLINICAL DATA:  42 year old female with a tender, palpable left breast lump. EXAM: DIGITAL DIAGNOSTIC BILATERAL MAMMOGRAM WITH TOMOSYNTHESIS AND CAD; ULTRASOUND LEFT BREAST LIMITED TECHNIQUE: Bilateral digital diagnostic mammography and breast tomosynthesis was performed. The images were evaluated with computer-aided detection.; Targeted ultrasound examination of the left breast was performed COMPARISON:  Previous exam(s). ACR Breast Density Category c: The breast tissue is heterogeneously dense, which may obscure small masses. FINDINGS: Radiopaque BB was placed at the site of the patient's lump in the upper-outer left breast. No focal or suspicious mammographic findings are seen deep to the radiopaque BB. No suspicious findings are identified in the remainder of either breast. On physical exam, I palpate no suspicious lumps in the upper-outer left breast. Targeted ultrasound is performed, showing dense, normal fibroglandular tissue without focal or suspicious sonographic abnormality. Evaluation of the entire upper outer left breast was performed. IMPRESSION: 1. No mammographic evidence of malignancy in either breast. 2. No suspicious sonographic findings at the site of the patient's focal left breast symptoms. RECOMMENDATION: 1. Clinical follow-up recommended for the palpable/painful area of concern in the left breast. Any further workup should be  based on clinical grounds. 2.  Screening mammogram in one year.(Code:SM-B-01Y) I have discussed the findings and recommendations with the patient. If applicable, a reminder letter will be sent to the patient regarding the next appointment. BI-RADS CATEGORY  1: Negative. Electronically Signed   By: [name] M.D.   On: 11/25/2020 10:32        Pelvic/Bimanual Pap is not indicated today    Smoking History: Patient has never smoked and was not referred to quit line.    Patient Navigation: Patient education provided. Access to services provided for patient through [facility] program. No interpreter provided. No transportation provided   Colorectal Cancer Screening: Per patient has never had colonoscopy completed No complaints today.    Breast and Cervical Cancer Risk Assessment: Patient does not have family history of breast cancer, known genetic mutations, or radiation treatment to the chest before age 24. Patient does not have history of cervical dysplasia, immunocompromised, or DES exposure in-utero.  Risk Scores as of Encounter on 02/17/2023     [name]           5-year 0.65%   Lifetime 9.7%   This patient is [place] but has no documented birth country, so the [place] model used data from [place] patients to calculate their risk score. Document a birth country in the Demographics activity for a more accurate score.         Last calculated by Caprice Red, CMA on 02/17/2023 at 10:49 AM          A: [facility] exam without pap smear Complaint of bilateral breast mass. Benign mass.  P: Referred patient to the [facility] for a diagnostic mammogram. Appointment scheduled 02/17/23.  Ilda Basset A, NP 02/17/2023 10:30 AM

## 2023-02-28 ENCOUNTER — Encounter: Payer: Self-pay | Admitting: Physician Assistant

## 2023-02-28 ENCOUNTER — Other Ambulatory Visit (INDEPENDENT_AMBULATORY_CARE_PROVIDER_SITE_OTHER): Payer: No Typology Code available for payment source

## 2023-02-28 ENCOUNTER — Ambulatory Visit (INDEPENDENT_AMBULATORY_CARE_PROVIDER_SITE_OTHER): Payer: No Typology Code available for payment source | Admitting: Physician Assistant

## 2023-02-28 VITALS — BP 130/80 | HR 73 | Ht 63.0 in | Wt 178.0 lb

## 2023-02-28 DIAGNOSIS — R1013 Epigastric pain: Secondary | ICD-10-CM

## 2023-02-28 DIAGNOSIS — K921 Melena: Secondary | ICD-10-CM

## 2023-02-28 DIAGNOSIS — R1011 Right upper quadrant pain: Secondary | ICD-10-CM

## 2023-02-28 DIAGNOSIS — K5909 Other constipation: Secondary | ICD-10-CM

## 2023-02-28 LAB — COMPREHENSIVE METABOLIC PANEL
ALT: 18 U/L (ref 0–35)
AST: 16 U/L (ref 0–37)
Albumin: 4 g/dL (ref 3.5–5.2)
Alkaline Phosphatase: 87 U/L (ref 39–117)
BUN: 13 mg/dL (ref 6–23)
CO2: 28 meq/L (ref 19–32)
Calcium: 9 mg/dL (ref 8.4–10.5)
Chloride: 102 meq/L (ref 96–112)
Creatinine, Ser: 0.61 mg/dL (ref 0.40–1.20)
GFR: 110.18 mL/min (ref >60.00)
Glucose, Bld: 117 mg/dL — ABNORMAL HIGH (ref 70–99)
Potassium: 4 meq/L (ref 3.5–5.1)
Sodium: 138 meq/L (ref 135–145)
Total Bilirubin: 0.3 mg/dL (ref 0.2–1.2)
Total Protein: 7.1 g/dL (ref 6.0–8.3)

## 2023-02-28 LAB — CBC WITH DIFFERENTIAL/PLATELET
Basophils Absolute: 0.1 10*3/uL (ref 0.0–0.1)
Basophils Relative: 0.9 % (ref 0.0–3.0)
Eosinophils Absolute: 0.3 10*3/uL (ref 0.0–0.7)
Eosinophils Relative: 3.3 % (ref 0.0–5.0)
HCT: 38.8 % (ref 36.0–46.0)
Hemoglobin: 12.7 g/dL (ref 12.0–15.0)
Lymphocytes Relative: 22.4 % (ref 12.0–46.0)
Lymphs Abs: 1.7 10*3/uL (ref 0.7–4.0)
MCHC: 32.9 g/dL (ref 30.0–36.0)
MCV: 93.5 fL (ref 78.0–100.0)
Monocytes Absolute: 0.5 10*3/uL (ref 0.1–1.0)
Monocytes Relative: 6.7 % (ref 3.0–12.0)
Neutro Abs: 5.1 10*3/uL (ref 1.4–7.7)
Neutrophils Relative %: 66.7 % (ref 43.0–77.0)
Platelets: 313 10*3/uL (ref 150.0–400.0)
RBC: 4.15 Mil/uL (ref 3.87–5.11)
RDW: 13.5 % (ref 11.5–15.5)
WBC: 7.7 10*3/uL (ref 4.0–10.5)

## 2023-02-28 MED ORDER — NA SULFATE-K SULFATE-MG SULF 17.5-3.13-1.6 GM/177ML PO SOLN: 1.0000 | Freq: Once | ORAL | 0 refills | Status: AC

## 2023-02-28 NOTE — Patient Instructions (Addendum)
Your provider has requested that you go to the basement level for lab work before leaving today. Press "B" on the elevator. The lab is located at the first door on the left as you exit the elevator.   You have been scheduled for an endoscopy and colonoscopy. Please follow the written instructions given to you at your visit today.  Please pick up your prep supplies at the pharmacy within the next 1-3 days.  If you use inhalers (even only as needed), please bring them with you on the day of your procedure.  DO NOT TAKE 7 DAYS PRIOR TO TEST- Trulicity (dulaglutide) Ozempic, Wegovy (semaglutide) Mounjaro (tirzepatide) Bydureon Bcise (exanatide extended release)  DO NOT TAKE 1 DAY PRIOR TO YOUR TEST Rybelsus (semaglutide) Adlyxin (lixisenatide) Victoza (liraglutide) Byetta (exanatide) _______________________________________________________  If your blood pressure at your visit was 140/90 or greater, please contact your primary care physician to follow up on this.  _______________________________________________________  If you are age 2 or older, your body mass index should be between 23-30. Your Body mass index is 31.53 kg/m. If this is out of the aforementioned range listed, please consider follow up with your Primary Care Provider.  If you are age 23 or younger, your body mass index should be between 19-25. Your Body mass index is 31.53 kg/m. If this is out of the aformentioned range listed, please consider follow up with your Primary Care Provider.   ________________________________________________________  The [facility] providers would like to encourage you to use [facility] to communicate with providers for non-urgent requests or questions.  Due to long hold times on the telephone, sending your provider a message by [facility] may be a faster and more efficient way to get a response.  Please allow 48 business hours for a response.  Please remember that this is for non-urgent requests.   _______________________________________________________

## 2023-02-28 NOTE — Progress Notes (Signed)
Agree with assessment and plan as outlined.  

## 2023-02-28 NOTE — Progress Notes (Signed)
Chief Complaint: Abdominal pain  HPI:    [name] is a 42 year old Hispanic female with a past medical history as listed below including anxiety, depression, diabetes and multiple others, known to Dr. Adela Lank, who presents to clinic today with a complaint of abdominal pain.    04/03/2021 CTAP with the Pado megaly and suspected mild hepatic steatosis, postcholecystectomy and large colonic stool burden.    08/06/2021 patient seen in clinic by Dr. Adela Lank for right upper quadrant pain, reflux, bloating and constipation.  She had previously been started on Meprazole 40 mg daily and checked for H. pylori which was negative.  Treated constipation with Linzess.  She was counseled on fatty liver and elevated LFTs.  Continued with abdominal pain regardless of Omeprazole 40 mg daily.  At that time refilled Linzess 145 mcg daily.  She was started on low FODMAP diet to help with bloating and distention.  Counseled on IBgard.  It was thought some of her discomfort was musculoskeletal/neuropathic.  Scheduled for EGD (this was never done).    Today, patient presents to clinic and tells me she was unable to have the EGD last time because she had lost her orange card and had not reapplied yet.  Describes that she is now having very similar issues to when she was seen last and would like to get evaluated.  Explains that she was diagnosed with COVID on 01/19/2023 and went on Paxlovid 01/20/2023 and after a week of this medication when she stopped it, she couldn't go to the bathroom at all, the first stool she had was more of a white color and then she really did not have anything at all for a long time, she started with increased epigastric pain and nausea and early satiety and a tight epigastrium compared to her lower abdomen.  She remained on her MiraLAX/prune/papaya and milk mixture and eventually was able to have a bowel movement, but tells me that approximately 3 times over the past week she has seen some jellylike  clots in her stool.  Continues with some early satiety and epigastric discomfort as well as her chronic right upper quadrant discomfort.  She continues MiraLAX, could not afford to stay on Linzess that this worked better for her.  Also continues Omeprazole 40 mg daily.    Denies fever, chills, weight loss or symptoms that awaken her from sleep.  Past Medical History:  Diagnosis Date   Abdominal pain 03/29/2021   Acute non-recurrent maxillary sinusitis 10/09/2022   Allergic reaction 06/07/2021   Angio-edema    ANXIETY 12/31/2008   Qualifier: Diagnosis of  By: Georgiana Shore  MD, Vernona Rieger     Breast mass in female 10/06/2017   Complication of anesthesia    Problem waking up once and a headache   Condyloma acuminatum 07/21/2006   Qualifier: History of  By: Lafonda Mosses MD, Darl Pikes     Depression    Diabetes mellitus without complication [facility])    Dyspnea 04/20/2022   Eczema    Family history of anesthesia complication    Mother had problem waking up   Hx gestational diabetes    Hypochondriasis 08/19/2014   She has multiple somatic complaints, and preoccupation with disease processes.   Laryngitis 02/15/2020   Menorrhagia 07/03/2014   Molluscum contagiosum infection 12/01/2018   Muscle strain of lower leg, right, initial encounter 03/05/2019   Pain in right wrist 11/03/2021   Right lower quadrant abdominal pain 05/07/2022   Spinal headache    Thyroid fullness 04/02/2014   Upper  respiratory tract infection 04/20/2022   Urticaria    Vaginal lesion 11/13/2020   Voice hoarseness 05/22/2020    Past Surgical History:  Procedure Laterality Date   CESAREAN SECTION     CHOLECYSTECTOMY N/A 09/06/2013   Procedure: LAPAROSCOPIC CHOLECYSTECTOMY;  Surgeon: Shelly Rubenstein, MD;  Location: [facility] OR;  Service: General;  Laterality: N/A;   GANGLION CYST EXCISION Right 08/03/2021   Procedure: RIGHT REMOVAL GANGLION OF WRIST;  Surgeon: Marlyne Beards, MD;  Location: [facility];  Service:  Orthopedics;  Laterality: Right;  regional with monitored anesthesia care   GANGLION CYST EXCISION Right 05/26/2022   Procedure: RIGHT WRIST VOLAR GANGLION CYST REMOVAL;  Surgeon: Tarry Kos, MD;  Location: [facility];  Service: Orthopedics;  Laterality: Right;   NASAL SINUS SURGERY  2003   TUBAL LIGATION      Current Outpatient Medications  Medication Sig Dispense Refill   cetirizine (ZYRTEC) 10 MG tablet Take 1 tablet (10 mg total) by mouth daily. 30 tablet 11   fluticasone (FLONASE) 50 MCG/ACT nasal spray Place 2 sprays into both nostrils daily. 16 g 5   ketorolac (ACULAR) 0.5 % ophthalmic solution 1 drop 2 (two) times daily.     metFORMIN (GLUCOPHAGE-XR) 500 MG 24 hr tablet Take 2 tablets (1,000 mg total) by mouth daily with breakfast. 90 tablet 3   Olopatadine HCl (PATADAY) 0.2 % SOLN Place 1 drop into both eyes 2 (two) times daily as needed. 2.5 mL 5   omeprazole (PRILOSEC) 40 MG capsule Take 1 capsule (40 mg total) by mouth daily. 30 capsule 3   polyethylene glycol powder (GLYCOLAX/MIRALAX) 17 GM/SCOOP powder Take 17 g by mouth 2 (two) times daily as needed. 3350 g 1   No current facility-administered medications for this visit.    Allergies as of 02/28/2023 - Review Complete 02/17/2023  Allergen Reaction Noted   Amoxicillin Hives and Swelling 07/11/2011   Penicillins Hives and Swelling 03/09/2012   Shellfish allergy Hives 08/03/2021    Family History  Problem Relation Age of Onset   Hypertension Mother    Hyperlipidemia Mother    Cervical cancer Mother    Diabetes Father    Hypertension Father    Irregular heart beat Father    Allergic rhinitis Daughter    Allergic rhinitis Daughter    Allergic rhinitis Son        milk   Throat cancer Maternal Grandmother    Prostate cancer Maternal Grandfather    Breast cancer Other    Diabetes Other    Stomach cancer Neg Hx    Colon cancer Neg Hx    Esophageal cancer Neg Hx    Pancreatic cancer Neg Hx      Social History   Socioeconomic History   Marital status: Married    Spouse name: Not on file   Number of children: 3   Years of education: Not on file   Highest education level: GED or equivalent  Occupational History   Not on file  Tobacco Use   Smoking status: Never    Passive exposure: Never   Smokeless tobacco: Never  Vaping Use   Vaping status: Never Used  Substance and Sexual Activity   Alcohol use: Yes    Comment: occ.   Drug use: No   Sexual activity: Yes    Birth control/protection: Surgical  Other Topics Concern   Not on file  Social History Narrative   Not on file   Social Determinants of Health  Financial Resource Strain: Low Risk  (11/09/2022)   Overall Financial Resource Strain (CARDIA)    Difficulty of Paying Living Expenses: Not hard at all  Food Insecurity: No Food Insecurity (02/17/2023)   Hunger Vital Sign    Worried About Running Out of Food in the Last Year: Never true    Ran Out of Food in the Last Year: Never true  Transportation Needs: No Transportation Needs (02/17/2023)   PRAPARE - Administrator, Civil Service (Medical): No    Lack of Transportation (Non-Medical): No  Physical Activity: Sufficiently Active (11/09/2022)   Exercise Vital Sign    Days of Exercise per Week: 5 days    Minutes of Exercise per Session: 30 min  Stress: No Stress Concern Present (11/09/2022)   [employer] of Occupational Health - Occupational Stress Questionnaire    Feeling of Stress : Not at all  Social Connections: Moderately Isolated (11/09/2022)   Social Connection and Isolation Panel [NHANES]    Frequency of Communication with Friends and Family: Once a week    Frequency of Social Gatherings with Friends and Family: Once a week    Attends Religious Services: 1 to 4 times per year    Active Member of [employer] or Organizations: No    Attends Engineer, structural: Not on file    Marital Status: Married  Catering manager Violence: Not  on file    Review of Systems:    Constitutional: No weight loss, fever or chills Cardiovascular: No chest pain   Respiratory: No SOB  Gastrointestinal: See HPI and otherwise negative   Physical Exam:  Vital signs: BP 130/80   Pulse 73   Ht 5\' 3"  (1.6 m)   Wt 178 lb (80.7 kg)   LMP 01/14/2023 (Exact Date)   BMI 31.53 kg/m   Constitutional:   Pleasant Hispanic female appears to be in NAD, Well developed, Well nourished, alert and cooperative Respiratory: Respirations even and unlabored. Lungs clear to auscultation bilaterally.   No wheezes, crackles, or rhonchi.  Cardiovascular: Normal S1, S2. No MRG. Regular rate and rhythm. No peripheral edema, cyanosis or pallor.  Gastrointestinal:  Soft, nondistended, Mild epigastric/RUQ ttp, No rebound or guarding. Normal bowel sounds. No appreciable masses or hepatomegaly. Rectal:  Not performed.  Psychiatric: Demonstrates good judgement and reason without abnormal affect or behaviors.  RELEVANT LABS AND IMAGING: CBC    Component Value Date/Time   WBC 12.2 (H) 06/12/2022 1829   RBC 4.35 06/12/2022 1829   HGB 13.7 06/12/2022 1829   HGB 12.7 05/04/2022 1053   HCT 41.4 06/12/2022 1829   HCT 37.3 05/04/2022 1053   PLT 315 06/12/2022 1829   PLT 327 05/04/2022 1053   MCV 95.2 06/12/2022 1829   MCV 92 05/04/2022 1053   MCH 31.5 06/12/2022 1829   MCHC 33.1 06/12/2022 1829   RDW 13.0 06/12/2022 1829   RDW 11.8 05/04/2022 1053   LYMPHSABS 2.3 06/12/2022 1829   LYMPHSABS 1.7 12/01/2018 0929   MONOABS 0.8 06/12/2022 1829   EOSABS 0.1 06/12/2022 1829   EOSABS 0.3 12/01/2018 0929   BASOSABS 0.1 06/12/2022 1829   BASOSABS 0.0 12/01/2018 0929    CMP     Component Value Date/Time   NA 137 01/07/2023 1705   K 4.3 01/07/2023 1705   CL 100 01/07/2023 1705   CO2 23 01/07/2023 1705   GLUCOSE 94 01/07/2023 1705   GLUCOSE 128 (H) 06/12/2022 1829   BUN 16 01/07/2023 1705   CREATININE 0.69 01/07/2023 1705  CREATININE 0.67 04/25/2015 0959    CALCIUM 9.3 01/07/2023 1705   PROT 6.8 01/07/2023 1705   ALBUMIN 4.2 01/07/2023 1705   AST 16 01/07/2023 1705   ALT 21 01/07/2023 1705   ALKPHOS 99 01/07/2023 1705   BILITOT 0.2 01/07/2023 1705   GFRNONAA >60 06/12/2022 1829   GFRNONAA >89 04/25/2015 0959   GFRAA 96 08/31/2019 1712   GFRAA >89 04/25/2015 0959    Assessment: 1.  Chronic right upper quadrant pain: Thought possibly related to gastritis in the past, but had recommended an EGD for further evaluation which patient never followed through with; consider gastritis vs functional dyspepsia +/- relation to chronic constipation 2.  Epigastric pain: With above 3.  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  Hematochezia: Over the past week 3-4 times jellylike clots mixed in with her stool; consider hemorrhoids versus polyps versus other  Plan: 1.  Scheduled patient for diagnostic EGD and colonoscopy in the [facility] with Dr. Adela Lank.  Did provide the patient a detailed list of risks for the procedures and she agrees to proceed. Patient is appropriate for endoscopic procedure(s) in the ambulatory ([facility]) setting.  2.  Patient will have a 2-day bowel prep given history of chronic constipation. 3.  CBC and CMP today 4.  Discussed Linzess, she would be interested in seeing if we can get her patient assistance.  Prescribed 145 mcg daily #90 with 3 refills.  Provided her with patient assistance paperwork. 5.  Patient to follow in clinic per recommendations after time of procedures.  Hyacinth Meeker, PA-C [facility] 02/28/2023, 10:40 AM  Cc: Fortunato Curling, DO

## 2023-03-07 ENCOUNTER — Ambulatory Visit (INDEPENDENT_AMBULATORY_CARE_PROVIDER_SITE_OTHER): Payer: No Typology Code available for payment source | Admitting: Student

## 2023-03-07 VITALS — BP 126/63 | HR 68 | Ht 63.0 in | Wt 179.4 lb

## 2023-03-07 DIAGNOSIS — N644 Mastodynia: Secondary | ICD-10-CM

## 2023-03-07 DIAGNOSIS — R102 Pelvic and perineal pain unspecified side: Secondary | ICD-10-CM

## 2023-03-07 DIAGNOSIS — R1011 Right upper quadrant pain: Secondary | ICD-10-CM

## 2023-03-07 DIAGNOSIS — R5383 Other fatigue: Secondary | ICD-10-CM

## 2023-03-07 DIAGNOSIS — K921 Melena: Secondary | ICD-10-CM

## 2023-03-07 DIAGNOSIS — R7303 Prediabetes: Secondary | ICD-10-CM

## 2023-03-07 LAB — POCT GLYCOSYLATED HEMOGLOBIN (HGB A1C): HbA1c, POC (prediabetic range): 6.2 % (ref 5.7–6.4)

## 2023-03-07 NOTE — Progress Notes (Unsigned)
    SUBJECTIVE:   CHIEF COMPLAINT / HPI:   Epigastric and Abdominal Pain RUQ pain Hematochezia:  -Seen by GI, know to Dr. Adela Lank  -Has EGD scheduled as well as colonoscopy as recommended by GI -Started Linzess per GI note, also provided patient with PAP -Reported jelly clots in stools to GI per note on last visit, still has same symptoms   Pelvic Pain: -Reported on last visit, instructed to received TVUS but has yet to have done, no showed last radiology visit  -Reported on last visit, swelling on the right side, pelvic region with menstrual cycle -Ongoing a few months.  -Wet prep negative on last check   Mammogram Results:  -Right breast mass found on MM, probably benign on 02/17/23 and has a follow up scheduled on 08/17/23.  -Recommendations per MM results: Right breast ultrasound in 6 months to reassess the 11 o'clock position probably benign mass. -Continues to have a burning sensation in the left breast  -No mass on breast imaging on left   PERTINENT  PMH / PSH:  Fatty Liver - follows GI  Pituitary adenoma on imaging 06/2021 stable from previous HLD - recheck Lipid panel  Pre-diabetes on metformin - recheck A1C today   OBJECTIVE:   BP 126/63   Pulse 68   Ht 5\' 3"  (1.6 m)   Wt 179 lb 6 oz (81.4 kg)   LMP 01/14/2023 (Exact Date)   SpO2 100%   BMI 31.77 kg/m   General: Alert and oriented in no apparent distress Heart: Regular rate and rhythm with no murmurs appreciated Lungs: CTA bilaterally, no wheezing Abdomen: Bowel sounds present, no abdominal pain Skin: Warm and dry Extremities: No lower extremity edema   ASSESSMENT/PLAN:   Assessment & Plan Prediabetes  Other fatigue      Alfredo Martinez, MD [facility]

## 2023-03-07 NOTE — Patient Instructions (Addendum)
It was great to see you today! Thank you for choosing [facility] for your primary care.  Today we addressed: Please have pelvic ultrasound performed -- please call [phone]  We will check some labs  Follow up with GI -- EGD + Colonoscopy  Keep an eye on the breast pain   If you haven't already, sign up for My Chart to have easy access to your labs results, and communication with your primary care physician. We are checking some labs today. If they are abnormal, I will call you. If they are normal, I will send you a MyChart message (if it is active) or a letter in the mail. If you do not hear about your labs in the next 2 weeks, please call the office. I recommend that you always bring your medications to each appointment as this makes it easy to ensure you are on the correct medications and helps [place] not miss refills when you need them. Call the clinic at [phone] if your symptoms worsen or you have any concerns. Return in about 2 months (around 05/07/2023) for Fatigue . Please arrive 15 minutes before your appointment to ensure smooth check in process.  We appreciate your efforts in making this happen.  Thank you for allowing me to participate in your care, Alfredo Martinez, MD 03/07/2023, 1:54 PM PGY-3, [facility]

## 2023-03-08 ENCOUNTER — Encounter: Payer: Self-pay | Admitting: Student

## 2023-03-08 DIAGNOSIS — K921 Melena: Secondary | ICD-10-CM | POA: Insufficient documentation

## 2023-03-08 HISTORY — DX: Melena: K92.1

## 2023-03-08 LAB — TSH RFX ON ABNORMAL TO FREE T4: TSH: 1.34 u[IU]/mL (ref 0.450–4.500)

## 2023-03-08 NOTE — Assessment & Plan Note (Signed)
Per patient, sees GI. Repeat CBC today

## 2023-03-08 NOTE — Assessment & Plan Note (Signed)
-  Recheck A1C today

## 2023-03-08 NOTE — Assessment & Plan Note (Signed)
Needs to reschedule pelvic ultrasound, provided with number to call to schedule.

## 2023-03-08 NOTE — Assessment & Plan Note (Addendum)
Unable to fully assess given time constraints of the visit with multiple problems.  Will assess CBC, TSH, A1c, B12 today. Follow up with PCP, need to set expectations of visit.

## 2023-03-08 NOTE — Assessment & Plan Note (Signed)
Unknown etiology, overall work up unremarkable to this point. EGD and colonoscopy per GI

## 2023-03-12 LAB — LIPID PANEL
Chol/HDL Ratio: 4.7 {ratio} — ABNORMAL HIGH (ref 0.0–4.4)
Cholesterol, Total: 199 mg/dL (ref 100–199)
HDL: 42 mg/dL (ref >39)
LDL Chol Calc (NIH): 110 mg/dL — ABNORMAL HIGH (ref 0–99)
Triglycerides: 274 mg/dL — ABNORMAL HIGH (ref 0–149)
VLDL Cholesterol Cal: 47 mg/dL — ABNORMAL HIGH (ref 5–40)

## 2023-03-12 LAB — CBC WITH DIFFERENTIAL/PLATELET
Basophils Absolute: 0 10*3/uL (ref 0.0–0.2)
Basos: 1 %
EOS (ABSOLUTE): 0.3 10*3/uL (ref 0.0–0.4)
Eos: 4 %
Hematocrit: 36.5 % (ref 34.0–46.6)
Hemoglobin: 12.2 g/dL (ref 11.1–15.9)
Immature Grans (Abs): 0 10*3/uL (ref 0.0–0.1)
Immature Granulocytes: 0 %
Lymphocytes Absolute: 2.4 10*3/uL (ref 0.7–3.1)
Lymphs: 29 %
MCH: 31 pg (ref 26.6–33.0)
MCHC: 33.4 g/dL (ref 31.5–35.7)
MCV: 93 fL (ref 79–97)
Monocytes Absolute: 0.6 10*3/uL (ref 0.1–0.9)
Monocytes: 7 %
Neutrophils Absolute: 4.8 10*3/uL (ref 1.4–7.0)
Neutrophils: 59 %
Platelets: 292 10*3/uL (ref 150–450)
RBC: 3.94 x10E6/uL (ref 3.77–5.28)
RDW: 12.3 % (ref 11.7–15.4)
WBC: 8.2 10*3/uL (ref 3.4–10.8)

## 2023-03-12 LAB — VITAMIN B12: Vitamin B-12: 627 pg/mL (ref 232–1245)

## 2023-03-24 ENCOUNTER — Encounter: Payer: Self-pay | Admitting: Certified Registered Nurse Anesthetist

## 2023-03-29 ENCOUNTER — Encounter: Payer: Self-pay | Admitting: Gastroenterology

## 2023-03-29 ENCOUNTER — Ambulatory Visit: Payer: No Typology Code available for payment source | Admitting: Gastroenterology

## 2023-03-29 VITALS — BP 140/80 | HR 64 | Temp 97.8°F | Resp 16 | Ht 63.0 in | Wt 178.0 lb

## 2023-03-29 DIAGNOSIS — K625 Hemorrhage of anus and rectum: Secondary | ICD-10-CM

## 2023-03-29 DIAGNOSIS — K2950 Unspecified chronic gastritis without bleeding: Secondary | ICD-10-CM

## 2023-03-29 DIAGNOSIS — K2951 Unspecified chronic gastritis with bleeding: Secondary | ICD-10-CM

## 2023-03-29 DIAGNOSIS — R1013 Epigastric pain: Secondary | ICD-10-CM

## 2023-03-29 DIAGNOSIS — K921 Melena: Secondary | ICD-10-CM

## 2023-03-29 MED ORDER — METOCLOPRAMIDE HCL 5 MG PO TABS: 5.0000 mg | ORAL_TABLET | Freq: Three times a day (TID) | ORAL | 0 refills | Status: DC

## 2023-03-29 MED ORDER — SODIUM CHLORIDE 0.9 % IV SOLN: 500.0000 mL | Freq: Once | INTRAVENOUS | Status: DC

## 2023-03-29 NOTE — Progress Notes (Signed)
Pt's states no medical or surgical changes since previsit or office visit. 

## 2023-03-29 NOTE — Patient Instructions (Addendum)
Handout on hemorrhoids given to you today  Continue current medications including Miralax for constipation    Handout on gastritis given to you today    Await gastric biopsy results    Reglan 5 mg by mouth three time a day prior to meals trial to see if helps- sent this order to your pharmacy   YOU HAD AN ENDOSCOPIC PROCEDURE TODAY AT THE [facility]:   Refer to the procedure report that was given to you for any specific questions about what was found during the examination.  If the procedure report does not answer your questions, please call your gastroenterologist to clarify.  If you requested that your care partner not be given the details of your procedure findings, then the procedure report has been included in a sealed envelope for you to review at your convenience later.  YOU SHOULD EXPECT: Some feelings of bloating in the abdomen. Passage of more gas than usual.  Walking can help get rid of the air that was put into your GI tract during the procedure and reduce the bloating. If you had a lower endoscopy (such as a colonoscopy or flexible sigmoidoscopy) you may notice spotting of blood in your stool or on the toilet paper. If you underwent a bowel prep for your procedure, you may not have a normal bowel movement for a few days.  Please Note:  You might notice some irritation and congestion in your nose or some drainage.  This is from the oxygen used during your procedure.  There is no need for concern and it should clear up in a day or so.  SYMPTOMS TO REPORT IMMEDIATELY:  Following lower endoscopy (colonoscopy or flexible sigmoidoscopy):  Excessive amounts of blood in the stool  Significant tenderness or worsening of abdominal pains  Swelling of the abdomen that is new, acute  Fever of 100F or higher  Following upper endoscopy (EGD)  Vomiting of blood or coffee ground material  New chest pain or pain under the shoulder blades  Painful or persistently  difficult swallowing  New shortness of breath  Fever of 100F or higher  Black, tarry-looking stools  For urgent or emergent issues, a gastroenterologist can be reached at any hour by calling [phone]. Do not use MyChart messaging for urgent concerns.    DIET:  We do recommend a small meal at first, but then you may proceed to your regular diet.  Drink plenty of fluids but you should avoid alcoholic beverages for 24 hours.  ACTIVITY:  You should plan to take it easy for the rest of today and you should NOT DRIVE or use heavy machinery until tomorrow (because of the sedation medicines used during the test).    FOLLOW UP: Our staff will call the number listed on your records the next business day following your procedure.  We will call around 7:15- 8:00 am to check on you and address any questions or concerns that you may have regarding the information given to you following your procedure. If we do not reach you, we will leave a message.     If any biopsies were taken you will be contacted by phone or by letter within the next 1-3 weeks.  Please call us at [phone] if you have not heard about the biopsies in 3 weeks.    SIGNATURES/CONFIDENTIALITY: You and/or your care partner have signed paperwork which will be entered into your electronic medical record.  These signatures attest to the fact that  that the information above on your After Visit Summary has been reviewed and is understood.  Full responsibility of the confidentiality of this discharge information lies with you and/or your care-partner.

## 2023-03-29 NOTE — Op Note (Signed)
[facility] Patient Name: [name] Procedure Date: 03/29/2023 2:40 PM MRN: [id number] Endoscopist: Viviann Spare P. Adela Lank , MD, [id number] Age: 42 Referring MD:  Date of Birth: 1980/09/19 Gender: Female Account #: [id number] Procedure:                Colonoscopy Indications:              Rectal bleeding - multiple episodes several weeks                            ago, has since resolved, history of constipation,                            on Miralax Medicines:                Monitored Anesthesia Care Procedure:                Pre-Anesthesia Assessment:                           - Prior to the procedure, a History and Physical                            was performed, and patient medications and                            allergies were reviewed. The patient's tolerance of                            previous anesthesia was also reviewed. The risks                            and benefits of the procedure and the sedation                            options and risks were discussed with the patient.                            All questions were answered, and informed consent                            was obtained. Prior Anticoagulants: The patient has                            taken no anticoagulant or antiplatelet agents. ASA                            Grade Assessment: II - A patient with mild systemic                            disease. After reviewing the risks and benefits,                            the patient was deemed in satisfactory condition to  undergo the procedure.                           After obtaining informed consent, the colonoscope                            was passed under direct vision. Throughout the                            procedure, the patient's blood pressure, pulse, and                            oxygen saturations were monitored continuously. The                            [id number]-HQ190L Colonoscope [id number] was  introduced                            through the anus and advanced to the the terminal                            ileum, with identification of the appendiceal                            orifice and IC valve. The colonoscopy was performed                            without difficulty. The patient tolerated the                            procedure well. The quality of the bowel                            preparation was good. The terminal ileum, ileocecal                            valve, appendiceal orifice, and rectum were                            photographed. Scope In: 3:07:24 PM Scope Out: 3:19:52 PM Scope Withdrawal Time: 0 hours 11 minutes 4 seconds  Total Procedure Duration: 0 hours 12 minutes 28 seconds  Findings:                 The perianal and digital rectal examinations were                            normal.                           The terminal ileum appeared normal.                           Internal hemorrhoids were found during  retroflexion. The hemorrhoids were small.                           The exam was otherwise without abnormality. No                            polyps. Complications:            No immediate complications. Estimated blood loss:                            None. Estimated Blood Loss:     Estimated blood loss: none. Impression:               - The examined portion of the ileum was normal.                           - Internal hemorrhoids.                           - The examination was otherwise normal.                           - No polyps or concerning pathology.                           It is likely that the patient had hemorrhoidal                            bleeding given the results of this exam. Recommendation:           - Patient has a contact number available for                            emergencies. The signs and symptoms of potential                            delayed complications were discussed with the                             patient. Return to normal activities tomorrow.                            Written discharge instructions were provided to the                            patient.                           - Resume previous diet.                           - Continue present medications.                           - Continue Miralax for constipation, avoid straining                           -  Repeat colonoscopy in 10 years for screening                            purposes. Viviann Spare P. Stefano Trulson, MD 03/29/2023 3:25:30 PM This report has been signed electronically.

## 2023-03-29 NOTE — Progress Notes (Signed)
Called to room to assist during endoscopic procedure.  Patient ID and intended procedure confirmed with present staff. Received instructions for my participation in the procedure from the performing physician.  

## 2023-03-29 NOTE — Progress Notes (Signed)
Dr Adela Lank made aware of pt c/o rt upper abd paimn same as when came in today. Pt has passed flatus multiple times and does feel better in that respect.Dr Adela Lank ok'd pt for d/c.

## 2023-03-29 NOTE — Progress Notes (Signed)
History and Physical Interval Note: Patient seen on 02/28/23 - no interval changes. EGD to evaluate persistent epigastric pain, early satiety, nausea. On omeprazole 40mg . Also here for colonoscopy for history of rectal bleeding. She reports ongoing abdominal discomfort as well. No interval changes although no bleeding since office visit.  She denies cardiopulmonary symptoms otherwise, wishes to proceed today.    03/29/2023 2:45 PM  [name]  has presented today for endoscopic procedure(s), with the diagnosis of  Encounter Diagnoses  Name Primary?   Hematochezia Yes   Abdominal pain, epigastric   .  The various methods of evaluation and treatment have been discussed with the patient and/or family. After consideration of risks, benefits and other options for treatment, the patient has consented to  the endoscopic procedure(s).   The patient's history has been reviewed, patient examined, no change in status, stable for surgery.  I have reviewed the patient's chart and labs.  Questions were answered to the patient's satisfaction.    Harlin Rain, MD [facility]

## 2023-03-29 NOTE — Op Note (Signed)
[facility] Patient Name: [name] Procedure Date: 03/29/2023 2:43 PM MRN: [id number] Endoscopist: Viviann Spare P. Adela Lank , MD, [id number] Age: 42 Referring MD:  Date of Birth: Dec 26, 1980 Gender: Female Account #: [id number] Procedure:                Upper GI endoscopy Indications:              Epigastric abdominal pain, Early satiety, Nausea -                            negative CT scan in 2022, s/p cholecystectomy Medicines:                Monitored Anesthesia Care Procedure:                Pre-Anesthesia Assessment:                           - Prior to the procedure, a History and Physical                            was performed, and patient medications and                            allergies were reviewed. The patient's tolerance of                            previous anesthesia was also reviewed. The risks                            and benefits of the procedure and the sedation                            options and risks were discussed with the patient.                            All questions were answered, and informed consent                            was obtained. Prior Anticoagulants: The patient has                            taken no anticoagulant or antiplatelet agents. ASA                            Grade Assessment: II - A patient with mild systemic                            disease. After reviewing the risks and benefits,                            the patient was deemed in satisfactory condition to                            undergo the procedure.  After obtaining informed consent, the endoscope was                            passed under direct vision. Throughout the                            procedure, the patient's blood pressure, pulse, and                            oxygen saturations were monitored continuously. The                            GIF [id number] #[id number] was introduced through the                            mouth,  and advanced to the second part of duodenum.                            The upper GI endoscopy was accomplished without                            difficulty. The patient tolerated the procedure                            well. Scope In: Scope Out: Findings:                 Esophagogastric landmarks were identified: the                            Z-line was found at 36 cm, the gastroesophageal                            junction was found at 36 cm and the upper extent of                            the gastric folds was found at 36 cm from the                            incisors.                           The exam of the esophagus was otherwise normal.                           Patchy mildly erythematous mucosa was found in the                            gastric body.                           The exam of the stomach was otherwise normal.                           Biopsies were taken with a cold  forceps for                            Helicobacter pylori testing.                           The examined duodenum was normal. Complications:            No immediate complications. Estimated blood loss:                            Minimal. Estimated Blood Loss:     Estimated blood loss was minimal. Impression:               - Esophagogastric landmarks identified.                           - Normal esophagus otherwise                           - Erythematous mucosa in the gastric body.                           - Normal stomach otherwise - biopsies taken to rule                            out H pylori                           - Normal examined duodenum.                           No overt cause for symptoms. Could have functional                            GI tract disorder, possible gastroparesis. Recommendation:           - Patient has a contact number available for                            emergencies. The signs and symptoms of potential                            delayed complications were  discussed with the                            patient. Return to normal activities tomorrow.                            Written discharge instructions were provided to the                            patient.                           - Resume previous diet.                           -  Continue present medications.                           - Await pathology results.                           - Will discuss options with patient - consider                            trial of Reglan 5mg  PO TID prior to meals for a few                            weeks to see if that helps [employer]. Jashan Cotten, MD 03/29/2023 3:27:56 PM This report has been signed electronically.

## 2023-03-29 NOTE — Progress Notes (Signed)
1329 Robinul 0.1 mg IV given due large amount of secretions upon assessment.  MD made aware, vss  

## 2023-03-30 ENCOUNTER — Telehealth: Payer: Self-pay

## 2023-03-30 NOTE — Telephone Encounter (Signed)
  Follow up Call-     03/29/2023    1:05 PM  Call back number  Post procedure Call Back phone  # [phone]  Permission to leave phone message Yes     Patient questions:  Do you have a fever, pain , or abdominal swelling? Yes.   Pain Score  3 *  Have you tolerated food without any problems? Yes.    Have you been able to return to your normal activities? Yes.    Do you have any questions about your discharge instructions: Diet   No. Medications  No. Follow up visit  No.  Do you have questions or concerns about your Care? No.  Actions: * If pain score is 4 or above: No action needed, pain <4.  Patient states pain scale of 3. Back pain extending to lower back. States feel bubbling in lower abdomin. Restless all night. Instructed to drink plenty of fluids, drink hot coffee hot tea. Move around to help expel air. Call back if symptoms worsens and will notify MD.

## 2023-04-01 ENCOUNTER — Ambulatory Visit (HOSPITAL_COMMUNITY)
Admission: RE | Admit: 2023-04-01 | Discharge: 2023-04-01 | Disposition: A | Discharge status: Home / Self Care | Payer: Self-pay | Source: Ambulatory Visit | Attending: Family Medicine | Admitting: Family Medicine

## 2023-04-01 DIAGNOSIS — N898 Other specified noninflammatory disorders of vagina: Secondary | ICD-10-CM | POA: Insufficient documentation

## 2023-04-04 ENCOUNTER — Ambulatory Visit (INDEPENDENT_AMBULATORY_CARE_PROVIDER_SITE_OTHER): Payer: No Typology Code available for payment source | Admitting: Student

## 2023-04-04 ENCOUNTER — Encounter: Payer: Self-pay | Admitting: Student

## 2023-04-04 VITALS — BP 137/70 | HR 75 | Ht 63.0 in | Wt 174.8 lb

## 2023-04-04 DIAGNOSIS — R300 Dysuria: Secondary | ICD-10-CM

## 2023-04-04 DIAGNOSIS — R1011 Right upper quadrant pain: Secondary | ICD-10-CM

## 2023-04-04 LAB — POCT URINALYSIS DIP (MANUAL ENTRY)
Bilirubin, UA: NEGATIVE
Blood, UA: NEGATIVE
Glucose, UA: NEGATIVE mg/dL
Ketones, POC UA: NEGATIVE mg/dL
Nitrite, UA: NEGATIVE
Protein Ur, POC: NEGATIVE mg/dL
Spec Grav, UA: 1.015 (ref 1.010–1.025)
Urobilinogen, UA: 0.2 U/dL
pH, UA: 6.5 (ref 5.0–8.0)

## 2023-04-04 LAB — POCT URINE PREGNANCY: Preg Test, Ur: NEGATIVE

## 2023-04-04 LAB — POCT UA - MICROSCOPIC ONLY: RBC, Urine, Miroscopic: NONE SEEN (ref 0–2)

## 2023-04-04 LAB — SURGICAL PATHOLOGY

## 2023-04-04 NOTE — Progress Notes (Signed)
    SUBJECTIVE:   CHIEF COMPLAINT / HPI:   Painful Urination: -Ongoing for 2 days -Patient reports that she recently had a colonoscopy and has since had pain with urination  -Colonoscopy- negative 03/2023 -No systemic symptoms  -No hematuria  -Feels lower pelvic pain with urination   RUQ Pain  Back Pain:  -Constant RUQ pain ongoing for quite some time  -Seems be getting worse and gets worse with the eating with early satiety -Follow with GI for chronic right upper quadrant pain -History cholecystectomy -Has H/O of fatty liver  -No rashes  -Radiates from right upper quadrant to mid back pain   PERTINENT  PMH / PSH:  Fatty Liver - follows GI  Pituitary adenoma on imaging 06/2021 stable from previous HLD  Pre-diabetes on metformin   OBJECTIVE:   BP 137/70   Pulse 75   Ht 5\' 3"  (1.6 m)   Wt 174 lb 12.8 oz (79.3 kg)   SpO2 99%   BMI 30.96 kg/m   General: Alert and oriented in no apparent distress Heart: Regular rate and rhythm with no murmurs appreciated Lungs: CTA bilaterally, no wheezing Abdomen: Bowel sounds present, point tender to palpation over the right upper quadrant, nondistended, soft Skin: Warm and dry   ASSESSMENT/PLAN:   Assessment & Plan Dysuria Will obtain UA for further assessment given her symptoms.  Will treat as indicated. RUQ pain Chronic right upper quadrant pain that has been persistent and patient feels is worsening.  Recently had colonoscopy and EGD that was biopsied for H. pylori.  Colonoscopy was overall unremarkable.  Reassuringly, hemodynamically stable and nonperitoneal abdominal exam.  With history of fatty liver, will obtain right upper quadrant ultrasound for further assessment.  Reassured by normal LFTs very recently.  Has also had a lipase recently that was negative and a CBC that was negative.  Weight change 178 lb>174 lb. check weight on next visit.  Given her symptoms and unknown etiology, will obtain right upper quadrant ultrasound  for further assessment.  Patient is without rash, making shingles unlikely.  No systemic symptoms.  Strict return precautions.  Workup to this point for her pain has been: Lipase, CMP that were overall unremarkable, colonoscopy and EGD as noted above.  Follows with GI.      Alfredo Martinez, MD [facility]

## 2023-04-04 NOTE — Assessment & Plan Note (Signed)
Chronic right upper quadrant pain that has been persistent and patient feels is worsening.  Recently had colonoscopy and EGD that was biopsied for H. pylori.  Colonoscopy was overall unremarkable.  Reassuringly, hemodynamically stable and nonperitoneal abdominal exam.  With history of fatty liver, will obtain right upper quadrant ultrasound for further assessment.  Reassured by normal LFTs very recently.  Has also had a lipase recently that was negative and a CBC that was negative.  Weight change 178 lb>174 lb. check weight on next visit.  Given her symptoms and unknown etiology, will obtain right upper quadrant ultrasound for further assessment.  Patient is without rash, making shingles unlikely.  No systemic symptoms.  Strict return precautions.  Workup to this point for her pain has been: Lipase, CMP that were overall unremarkable, colonoscopy and EGD as noted above.  Follows with GI.

## 2023-04-04 NOTE — Patient Instructions (Addendum)
It was great to see you today! Thank you for choosing [facility] for your primary care.  Today we addressed: We will check ultrasound of the right upper quadrant  I will update you about the urine   If you haven't already, sign up for My Chart to have easy access to your labs results, and communication with your primary care physician. We are checking some labs today. If they are abnormal, I will call you. If they are normal, I will send you a MyChart message (if it is active) or a letter in the mail. If you do not hear about your labs in the next 2 weeks, please call the office. I recommend that you always bring your medications to each appointment as this makes it easy to ensure you are on the correct medications and helps [place] not miss refills when you need them. Call the clinic at [phone] if your symptoms worsen or you have any concerns. Follow up in 1 month  Please arrive 15 minutes before your appointment to ensure smooth check in process.  We appreciate your efforts in making this happen.  Thank you for allowing me to participate in your care, Alfredo Martinez, MD 04/04/2023, 9:46 AM PGY-3, [facility]

## 2023-04-05 ENCOUNTER — Ambulatory Visit (HOSPITAL_COMMUNITY): Payer: Self-pay

## 2023-04-08 ENCOUNTER — Ambulatory Visit (HOSPITAL_COMMUNITY)
Admission: RE | Admit: 2023-04-08 | Discharge: 2023-04-08 | Disposition: A | Discharge status: Home / Self Care | Payer: No Typology Code available for payment source | Source: Ambulatory Visit | Attending: Family Medicine | Admitting: Family Medicine

## 2023-04-08 DIAGNOSIS — R1011 Right upper quadrant pain: Secondary | ICD-10-CM | POA: Insufficient documentation

## 2023-05-10 NOTE — Progress Notes (Signed)
    SUBJECTIVE:   CHIEF COMPLAINT / HPI: follow up  ***  PERTINENT  PMH / PSH: ***  OBJECTIVE:   There were no vitals taken for this visit.  General: Awake and Alert in NAD HEENT: NCAT. Sclera anicteric. No rhinorrhea. No oropharyngeal erythema. Cardiovascular: RRR. No M/R/G Respiratory: CTAB, normal WOB on RA. No wheezing, crackles, rhonchi, or diminished breath sounds. Abdomen: Soft, non-tender, non-distended. Bowel sounds normoactive/hypoactive/hyperactive. *** Extremities: Able to move all extremities equally. No BLE edema, no deformities or significant joint findings. Skin: Warm and dry. No abrasions or rashes noted. Neuro: A&Ox***. No focal neurological deficits.  ASSESSMENT/PLAN:   No problem-specific Assessment & Plan notes found for this encounter.     Fortunato Curling, DO [facility]

## 2023-05-12 ENCOUNTER — Ambulatory Visit: Payer: No Typology Code available for payment source | Admitting: Family Medicine

## 2023-05-12 VITALS — BP 143/83 | HR 70 | Ht 63.0 in | Wt 174.6 lb

## 2023-05-12 DIAGNOSIS — N941 Unspecified dyspareunia: Secondary | ICD-10-CM

## 2023-05-12 DIAGNOSIS — N63 Unspecified lump in unspecified breast: Secondary | ICD-10-CM

## 2023-05-12 DIAGNOSIS — H539 Unspecified visual disturbance: Secondary | ICD-10-CM

## 2023-05-12 NOTE — Patient Instructions (Addendum)
It was great to see you today! Thank you for choosing [facility] for your primary care. [name] was seen for check up.  Today we addressed: Vaginal Pain w/ sex - keep a log of your symptoms, how long they last, how often this occurs, are you having vaginal discharge, what helps make it better vs worse? Breast mass - follow up mammogram already scheduled this is likely more noticeable now that you are aware of the mass but also due to your period. We will monitor with the repeat US Vision changes - discuss with your eye doctor and consider trying reading glasses to help with the blurriness  You should return to our clinic No follow-ups on file. Please arrive 15 minutes before your appointment to ensure smooth check in process.  We appreciate your efforts in making this happen.  Thank you for allowing me to participate in your care, Fortunato Curling, DO 05/12/2023, 4:47 PM PGY-1, [facility]

## 2023-05-12 NOTE — Assessment & Plan Note (Signed)
Blurry vision with no pain, dizziness, or headaches.  - Advised follow up with her eye doctor and to try reading glasses to see if this could help her symptoms.

## 2023-05-12 NOTE — Assessment & Plan Note (Signed)
Mammogram 01/2023 demonstrated benign 1.2 cm mass in the right breast at 11 o'clock, 3 cm from the nipple. No signs of infection, abscess, or irritation noted today on exam.  - Will continue to follow with R breast US in 07/2023

## 2023-05-30 ENCOUNTER — Encounter: Payer: Self-pay | Admitting: Family Medicine

## 2023-05-30 ENCOUNTER — Ambulatory Visit (INDEPENDENT_AMBULATORY_CARE_PROVIDER_SITE_OTHER): Payer: No Typology Code available for payment source | Admitting: Family Medicine

## 2023-05-30 VITALS — BP 122/77 | HR 95 | Ht 63.0 in | Wt 174.2 lb

## 2023-05-30 DIAGNOSIS — J069 Acute upper respiratory infection, unspecified: Secondary | ICD-10-CM

## 2023-05-30 NOTE — Patient Instructions (Signed)
 Good to see you today - Thank you for coming in  Things we discussed today:  1) You are most likely recovering from a viral upper respiratory infection.  Your lungs sound clear, which makes me think you do not have pneumonia.  Your body should naturally fight off this infection over the next 7 days after symptoms started.  You may notice that cough will continue for a few weeks after the infection is already gone.  This is normal and it is your body's natural way of clearing out debris from your lungs -You can continue to take DayQuil and NyQuil, these can be helpful for improving your cough and aches and pains -Make sure you drink plenty of fluids to stay hydrated.  Your urine should be clear or light yellow in color.  -Stay out of work until you are fever free for 24 hours.  Please seek further medical attention if you: - have trouble breathing - have chest pain - lose consciousness or feel close to fainting - are unable to drink enough to stay hydrated - have fevers of 100.66F or higher for 3 days in a row

## 2023-05-30 NOTE — Progress Notes (Signed)
    SUBJECTIVE:   CHIEF COMPLAINT / HPI:   [name] is a 43yo F w/ hx of fatty liver dz, prediabetes that p/f cough - Started feeling sick Friday, more fatigued and SOB. Feeling tightness in her chest. Then Saturday, had more productive cough and felt feverish and chills (no fever). - Does not smoke, no hx of asthma. - Taking Dayquil, nightquil, congestion med, and drinking teas and honey. Was getting better yesterday, but then felt more SOB this morning again so came in.  - Daughter feeling sick today too.   OBJECTIVE:   BP 122/77   Pulse 95   Ht 5' 3 (1.6 m)   Wt 174 lb 3.2 oz (79 kg)   SpO2 98%   BMI 30.86 kg/m   General: Alert, pleasant well-appearing woman speaking in full sentences on room air. NAD. HEENT: NCAT. MMM.  No cervical LAD CV: RRR, no murmurs. Resp: CTAB, no wheezing or crackles. Normal WOB on RA.  Abm: Soft, nontender, nondistended. BS present. Ext: Moves all ext spontaneously Skin: Warm, well perfused   ASSESSMENT/PLAN:   Assessment & Plan Viral URI Exam and history most consistent with viral URI.  Lung exam benign and normal O2 sat on room air, less likely pneumonia.  Patient appears hydrated on exam, vital signs stable, overall appears to be improving from her infection.  Advised to return to work when she is 24 hours fever free. -Supportive care discussed including Tylenol  and hydration -Return precautions discussed as well - Work note provided    Janice Nearing, MD [facility]

## 2023-08-09 ENCOUNTER — Encounter (HOSPITAL_COMMUNITY): Payer: Self-pay | Admitting: *Deleted

## 2023-08-09 ENCOUNTER — Telehealth: Payer: Self-pay

## 2023-08-09 ENCOUNTER — Ambulatory Visit (HOSPITAL_COMMUNITY)
Admission: EM | Admit: 2023-08-09 | Discharge: 2023-08-09 | Disposition: A | Discharge status: Home / Self Care | Payer: Self-pay

## 2023-08-09 DIAGNOSIS — R5383 Other fatigue: Secondary | ICD-10-CM | POA: Insufficient documentation

## 2023-08-09 DIAGNOSIS — R002 Palpitations: Secondary | ICD-10-CM

## 2023-08-09 LAB — CBC WITH DIFFERENTIAL/PLATELET
Abs Immature Granulocytes: 0.03 10*3/uL (ref 0.00–0.07)
Basophils Absolute: 0.1 10*3/uL (ref 0.0–0.1)
Basophils Relative: 1 %
Eosinophils Absolute: 0.3 10*3/uL (ref 0.0–0.5)
Eosinophils Relative: 4 %
HCT: 37.4 % (ref 36.0–46.0)
Hemoglobin: 12.3 g/dL (ref 12.0–15.0)
Immature Granulocytes: 0 %
Lymphocytes Relative: 29 %
Lymphs Abs: 2.5 10*3/uL (ref 0.7–4.0)
MCH: 31.4 pg (ref 26.0–34.0)
MCHC: 32.9 g/dL (ref 30.0–36.0)
MCV: 95.4 fL (ref 80.0–100.0)
Monocytes Absolute: 0.6 10*3/uL (ref 0.1–1.0)
Monocytes Relative: 7 %
Neutro Abs: 5.1 10*3/uL (ref 1.7–7.7)
Neutrophils Relative %: 59 %
Platelets: 296 10*3/uL (ref 150–400)
RBC: 3.92 MIL/uL (ref 3.87–5.11)
RDW: 13.6 % (ref 11.5–15.5)
WBC: 8.6 10*3/uL (ref 4.0–10.5)
nRBC: 0 % (ref 0.0–0.2)

## 2023-08-09 LAB — COMPREHENSIVE METABOLIC PANEL
ALT: 18 U/L (ref 0–44)
AST: 22 U/L (ref 15–41)
Albumin: 3.5 g/dL (ref 3.5–5.0)
Alkaline Phosphatase: 64 U/L (ref 38–126)
Anion gap: 9 (ref 5–15)
BUN: 12 mg/dL (ref 6–20)
CO2: 22 mmol/L (ref 22–32)
Calcium: 8.8 mg/dL — ABNORMAL LOW (ref 8.9–10.3)
Chloride: 107 mmol/L (ref 98–111)
Creatinine, Ser: 0.63 mg/dL (ref 0.44–1.00)
GFR, Estimated: >60 mL/min (ref >60)
Glucose, Bld: 91 mg/dL (ref 70–99)
Potassium: 4 mmol/L (ref 3.5–5.1)
Sodium: 138 mmol/L (ref 135–145)
Total Bilirubin: 0.6 mg/dL (ref 0.0–1.2)
Total Protein: 6.9 g/dL (ref 6.5–8.1)

## 2023-08-09 LAB — POC COVID19/FLU A&B COMBO
Covid Antigen, POC: NEGATIVE
Influenza A Antigen, POC: NEGATIVE
Influenza B Antigen, POC: NEGATIVE

## 2023-08-09 LAB — POCT URINALYSIS DIP (MANUAL ENTRY)
Bilirubin, UA: NEGATIVE
Glucose, UA: NEGATIVE mg/dL
Ketones, POC UA: NEGATIVE mg/dL
Nitrite, UA: NEGATIVE
Protein Ur, POC: NEGATIVE mg/dL
Spec Grav, UA: 1.01 (ref 1.010–1.025)
Urobilinogen, UA: 0.2 U/dL
pH, UA: 5.5 (ref 5.0–8.0)

## 2023-08-09 LAB — POCT FASTING CBG KUC MANUAL ENTRY: POCT Glucose (KUC): 112 mg/dL — AB (ref 70–99)

## 2023-08-09 LAB — POCT URINE PREGNANCY: Preg Test, Ur: NEGATIVE

## 2023-08-09 MED ORDER — SODIUM CHLORIDE 0.9 % IV BOLUS
1000.0000 mL | Freq: Once | INTRAVENOUS | Status: AC
  Administered 2023-08-09: 1000 mL via INTRAVENOUS

## 2023-08-09 NOTE — ED Provider Notes (Signed)
 [facility]  Note:  This document was prepared using Dragon voice recognition software and may include unintentional dictation errors.  MRN: [id number] DOB: 1981/03/04  Subjective:   [name] is a 43 y.o. female presenting for intermittent vertigo, tightness in her head and neck, fatigue, elevated blood pressure, polydipsia, palpitations.  Patient denies any headache, chest pain, shortness of breath, dysuria, frequent urination, abdominal pain.  Patient also reports that she has not been taking her metformin for about a week however she restarted taking that last night.  Patient called her PCP who advised her to come to urgent care for evaluation and IV fluid therapy.  No current facility-administered medications for this encounter.  Current Outpatient Medications:    cetirizine (ZYRTEC) 10 MG tablet, Take 1 tablet (10 mg total) by mouth daily., Disp: 30 tablet, Rfl: 11   fluticasone (FLONASE) 50 MCG/ACT nasal spray, Place 2 sprays into both nostrils daily., Disp: 16 g, Rfl: 5   metFORMIN (GLUCOPHAGE-XR) 500 MG 24 hr tablet, Take 2 tablets (1,000 mg total) by mouth daily with breakfast., Disp: 90 tablet, Rfl: 3   ketorolac (ACULAR) 0.5 % ophthalmic solution, 1 drop 2 (two) times daily., Disp: , Rfl:    metoCLOPramide (REGLAN) 5 MG tablet, Take 1 tablet (5 mg total) by mouth 3 (three) times daily before meals., Disp: 90 tablet, Rfl: 0   Olopatadine HCl (PATADAY) 0.2 % SOLN, Place 1 drop into both eyes 2 (two) times daily as needed. (Patient not taking: Reported on 03/29/2023), Disp: 2.5 mL, Rfl: 5   omeprazole (PRILOSEC) 40 MG capsule, Take 1 capsule (40 mg total) by mouth daily., Disp: 30 capsule, Rfl: 3   polyethylene glycol powder (GLYCOLAX/MIRALAX) 17 GM/SCOOP powder, Take 17 g by mouth 2 (two) times daily as needed., Disp: 3350 g, Rfl: 1   Allergies  Allergen Reactions   Amoxicillin Hives and Swelling   Penicillins Hives and Swelling    Pt received Ceftriaxone in 2014  without rxn. Allergy updated by S. Andrey Campanile, PharmD (05/25/22)   Shellfish Allergy Hives    Past Medical History:  Diagnosis Date   Abdominal pain 03/29/2021   Acute non-recurrent maxillary sinusitis 10/09/2022   Allergic reaction 06/07/2021   Angio-edema    ANXIETY 12/31/2008   Qualifier: Diagnosis of  By: Georgiana Shore  MD, Vernona Rieger     Breast mass in female 10/06/2017   Complication of anesthesia    Problem waking up once and a headache   Condyloma acuminatum 07/21/2006   Qualifier: History of  By: Lafonda Mosses MD, Darl Pikes     Depression    Diabetes mellitus without complication [facility])    Dyspnea 04/20/2022   Eczema    Family history of anesthesia complication    Mother had problem waking up   Hx gestational diabetes    Hypochondriasis 08/19/2014   She has multiple somatic complaints, and preoccupation with disease processes.   Laryngitis 02/15/2020   Menorrhagia 07/03/2014   Molluscum contagiosum infection 12/01/2018   Muscle strain of lower leg, right, initial encounter 03/05/2019   Pain in right wrist 11/03/2021   Right lower quadrant abdominal pain 05/07/2022   Spinal headache    Thyroid fullness 04/02/2014   Upper respiratory tract infection 04/20/2022   Urticaria    Vaginal lesion 11/13/2020   Voice hoarseness 05/22/2020     Past Surgical History:  Procedure Laterality Date   CESAREAN SECTION     CHOLECYSTECTOMY N/A 09/06/2013   Procedure: LAPAROSCOPIC CHOLECYSTECTOMY;  Surgeon: Shelly Rubenstein, MD;  Location: [facility] OR;  Service: General;  Laterality: N/A;   GANGLION CYST EXCISION Right 08/03/2021   Procedure: RIGHT REMOVAL GANGLION OF WRIST;  Surgeon: Marlyne Beards, MD;  Location: [facility];  Service: Orthopedics;  Laterality: Right;  regional with monitored anesthesia care   GANGLION CYST EXCISION Right 05/26/2022   Procedure: RIGHT WRIST VOLAR GANGLION CYST REMOVAL;  Surgeon: Tarry Kos, MD;  Location: [facility];  Service: Orthopedics;   Laterality: Right;   NASAL SINUS SURGERY  2003   TUBAL LIGATION      Family History  Problem Relation Age of Onset   Hypertension Mother    Hyperlipidemia Mother    Cervical cancer Mother    Diabetes Father    Hypertension Father    Irregular heart beat Father    Allergic rhinitis Daughter    Allergic rhinitis Daughter    Allergic rhinitis Son        milk   Throat cancer Maternal Grandmother    Prostate cancer Maternal Grandfather    Breast cancer Other    Diabetes Other    Stomach cancer Neg Hx    Colon cancer Neg Hx    Esophageal cancer Neg Hx    Pancreatic cancer Neg Hx     Social History   Tobacco Use   Smoking status: Never    Passive exposure: Never   Smokeless tobacco: Never  Vaping Use   Vaping status: Never Used  Substance Use Topics   Alcohol use: Yes    Comment: occ.   Drug use: No    ROS Refer to HPI for ROS details.  Objective:   Vitals: BP (!) 144/85 (BP Location: Right Arm)   Temp 98.3 F (36.8 C) (Oral)   Resp 18   LMP 08/04/2023 (Exact Date)   SpO2 97%   Physical Exam Vitals and nursing note reviewed.  Constitutional:      General: She is not in acute distress.    Appearance: Normal appearance. She is well-developed. She is not ill-appearing, toxic-appearing or diaphoretic.  HENT:     Head: Normocephalic and atraumatic.     Mouth/Throat:     Mouth: Mucous membranes are moist.  Eyes:     General:        Right eye: No discharge.        Left eye: No discharge.     Extraocular Movements: Extraocular movements intact.     Conjunctiva/sclera: Conjunctivae normal.     Pupils: Pupils are equal, round, and reactive to light.  Cardiovascular:     Rate and Rhythm: Normal rate and regular rhythm.     Heart sounds: No murmur heard. Pulmonary:     Effort: Pulmonary effort is normal. No respiratory distress.     Breath sounds: Normal breath sounds. No wheezing, rhonchi or rales.  Musculoskeletal:     Cervical back: Neck supple.  Skin:     General: Skin is warm and dry.     Capillary Refill: Capillary refill takes less than 2 seconds.  Neurological:     General: No focal deficit present.     Mental Status: She is alert and oriented to person, place, and time.     Cranial Nerves: No cranial nerve deficit.     Sensory: No sensory deficit.     Motor: No weakness.  Psychiatric:        Mood and Affect: Mood normal.        Behavior: Behavior normal.  Procedures  Results for orders placed or performed during the hospital encounter of 08/09/23 (from the past 24 hours)  POC Covid19/Flu A&B Antigen     Status: None   Collection Time: 08/09/23  4:59 PM  Result Value Ref Range   Influenza A Antigen, POC Negative Negative   Influenza B Antigen, POC Negative Negative   Covid Antigen, POC Negative Negative  POC urinalysis dipstick     Status: Abnormal   Collection Time: 08/09/23  4:59 PM  Result Value Ref Range   Color, UA yellow yellow   Clarity, UA clear clear   Glucose, UA negative negative mg/dL   Bilirubin, UA negative negative   Ketones, POC UA negative negative mg/dL   Spec Grav, UA 9.147 8.295 - 1.025   Blood, UA large (A) negative   pH, UA 5.5 5.0 - 8.0   Protein Ur, POC negative negative mg/dL   Urobilinogen, UA 0.2 0.2 or 1.0 E.U./dL   Nitrite, UA Negative Negative   Leukocytes, UA Trace (A) Negative  POCT urine pregnancy     Status: None   Collection Time: 08/09/23  4:59 PM  Result Value Ref Range   Preg Test, Ur Negative Negative  POC CBG monitoring     Status: Abnormal   Collection Time: 08/09/23  5:07 PM  Result Value Ref Range   POCT Glucose ([facility]) 112 (A) 70 - 99 mg/dL    Assessment and Plan :   PDMP not reviewed this encounter.  1. Fatigue, unspecified type    1. Fatigue, unspecified type (Primary) - ED EKG performed in UC showing normal sinus rhythm, no STEMI - POC Covid19/Flu A&B Antigen performed in UC negative for COVID and influenza - POC urinalysis dipstick performed in UC showing  large blood and trace leukocytes, no nitrite, no sign of urinary tract infection - POCT urine pregnancy performed in UC negative - POC CBG monitoring performed in UC result 112 mg/dL - Urine Culture sent to lab for further testing - Inserted peripheral IV and given sodium chloride 0.9 % bolus 1,000 mL for fatigue and vertigo. - Comprehensive metabolic panel and CBC with Differential collected in UC sent to lab for further testing results should be available within 24 hours. -Continue to monitor symptoms for any change in severity if there is any escalation of current symptoms or development of new symptoms follow-up in ER for further evaluation and treatment.  Lucky Cowboy   [place], [place], [place] 08/09/23 1757

## 2023-08-09 NOTE — Telephone Encounter (Signed)
 Patient calls nurse line requesting a same day apt.   She reports she woke up this morning feeling "drunk." She reports a headache, dizziness and blurred vision. She reports a rapid heart rate as well.  She reports she took her BP this morning and 160/88.  She denies any shortness of breath or chest pains.   Unfortunately, we do not have any apts left for today.   Patient advised to go to urgent care.   Patient agreed with plan.

## 2023-08-09 NOTE — ED Triage Notes (Addendum)
 Pt states she has been feeling weird a couple weeks.   The last two days she states her vertigo is worse, no headache but states her head is tight, she feels her BP is high she took it this morning it was 160/88, she is fatigues, also feels like she is dehydrated but she is drinking Pedialyte. She feels like she has rapid heart beat sometimes but she has no chest pain.   She has been off her metformin x 1 week and restarted it last night. She has not checked her BP  Pt states she was told by PCP to come here for IV fluids so she wouldn't have to wait at the hospital.

## 2023-08-09 NOTE — Discharge Instructions (Addendum)
 1. Fatigue, unspecified type (Primary) - ED EKG performed in UC showing normal sinus rhythm, no STEMI - POC Covid19/Flu A&B Antigen performed in UC negative for COVID and influenza - POC urinalysis dipstick performed in UC showing large blood and trace leukocytes, no nitrite, no sign of urinary tract infection - POCT urine pregnancy performed in UC negative - POC CBG monitoring performed in UC result 112 mg/dL - Urine Culture sent to lab for further testing - Inserted peripheral IV and given sodium chloride 0.9 % bolus 1,000 mL for fatigue and vertigo. - Comprehensive metabolic panel and CBC with Differential collected in UC sent to lab for further testing results should be available within 24 hours. -Continue to monitor symptoms for any change in severity if there is any escalation of current symptoms or development of new symptoms follow-up in ER for further evaluation and treatment.

## 2023-08-11 ENCOUNTER — Telehealth (HOSPITAL_COMMUNITY): Payer: Self-pay

## 2023-08-11 LAB — URINE CULTURE
Culture: >=100000 — AB
Special Requests: NORMAL

## 2023-08-11 MED ORDER — NITROFURANTOIN MONOHYD MACRO 100 MG PO CAPS
100.0000 mg | ORAL_CAPSULE | Freq: Two times a day (BID) | ORAL | 0 refills | Status: DC
Start: 2023-08-11 — End: 2023-10-11

## 2023-08-11 NOTE — Telephone Encounter (Signed)
-----   Message from Carlisle Beers sent at 08/11/2023  4:03 PM EDT ----- Urine culture shows E. Coli UTI. Macrobid 100mg  BID for 5 days.  Follow-up if symptoms worsening.  No signs/symptoms of pyelonephritis per provider note. ----- Message ----- From: Warren Danes, RN Sent: 08/11/2023   2:47 PM EDT To: Carlisle Beers, FNP; #  Hello, please advise if any updates are needed for pt's urine culture. Thank you.

## 2023-08-11 NOTE — Telephone Encounter (Signed)
 Reviewed with patient, verified pharmacy, prescription sent.  Questions answered.

## 2023-08-15 ENCOUNTER — Ambulatory Visit (INDEPENDENT_AMBULATORY_CARE_PROVIDER_SITE_OTHER): Payer: Self-pay | Admitting: Family Medicine

## 2023-08-15 ENCOUNTER — Encounter: Payer: Self-pay | Admitting: Family Medicine

## 2023-08-15 VITALS — BP 130/80 | HR 95 | Ht 63.0 in | Wt 174.8 lb

## 2023-08-15 DIAGNOSIS — Z72820 Sleep deprivation: Secondary | ICD-10-CM

## 2023-08-15 DIAGNOSIS — R519 Headache, unspecified: Secondary | ICD-10-CM

## 2023-08-15 MED ORDER — ESCITALOPRAM OXALATE 10 MG PO TABS: 10.0000 mg | ORAL_TABLET | Freq: Every day | ORAL | 2 refills | Status: DC

## 2023-08-15 NOTE — Patient Instructions (Signed)
 Good to see you today - Thank you for coming in  Things we discussed today:  1) For your headaches, it is most likely due to tension-type headaches.  This can be worsened by stress and poor sleep. -Take Tylenol and ibuprofen as needed for these headaches -Try to get 8 hours of sleep every night if possible. -You can use melatonin as needed to help with sleep  2) start taking Lexapro 10 mg once a day at bedtime.  This can help treat stress causing any mood or anxiety symptoms.  This can also help with menopause symptoms such as hot flashes. -Side effects include low libido, diarrhea.  These can sometimes improve as you keep taking it.  Let me know if the side effects become too strong.  Please seek further medical attention if you: -Have worsening blurry vision -Blurry vision is not improving after 1 to 2 weeks -You have weakness in your body -You become confused, or have slurred speech  Back to see me in 4 to 6 weeks, or sooner if your symptoms worsen

## 2023-08-15 NOTE — Progress Notes (Unsigned)
    SUBJECTIVE:   CHIEF COMPLAINT / HPI:   MC is a 43 year old F with history of HLD and prediabetes that presents for urgent care follow-up. - Seen UC 3/18 for 1 day of acute fatigue, elevated BP, vertigo. Had stopped metformin for 1 wk and then restarted it the night before symptom onset. Pt was given IVF. BP 144/85 at that visit. CMP, Upreg, EKG, CBC unremarkable.   - Started last last Thursday. Reports having poor sleep and only getting about 4 hours of sleep for a few days prior. - Then woke up on 3/18 feeling "drunk", foggy, dizzy and thus went to UC. - Also feels like her vision is getting blurry, especially R eye - Also having vertigo and head spinning - Also having bilateral temple tightness - Has had similar issues for past few months, especially when she has poor sleep. But this past week has been worse. - Has seasonal allergies, but does not feel particularly stuffy or congested at this time. - Also having headaches that feel like tightness on the crown of her head and at her temples.   - Also reports having hot flashes and low libido. Periods irregular. - Reports that her kids will joke that pt is never interested in doing activities anymore and this causes stress for pt - Also reports having low libido and this is placing strain on her relationship  OBJECTIVE:   BP 130/80   Pulse 95   Ht 5\' 3"  (1.6 m)   Wt 174 lb 12.8 oz (79.3 kg)   LMP 08/04/2023 (Exact Date)   SpO2 97%   BMI 30.96 kg/m   General: Alert, pleasant well-appearing woman. NAD. HEENT: NCAT. MMM.PERRLA. Conjunctiva clear.  Tender to palpation along crown of head and temples. CV: RRR, no murmurs. Cap refill <2. Resp: CTAB, no wheezing or crackles. Normal WOB on RA.  Ext: Moves all ext spontaneously Skin: Warm, well perfused  Neuro: Sensation and strength symmetric in bilateral upper extremities.Visual fields intact CN2: no vision changes CN3,4,6: PERRLA. EOMI CN5: Sensation intact BL CN7: Facial  expressions symmetric CN8: Hearing intact BL CN9: palate symmetric  CN10: regular speech CN11: turns head against resistance CN12: tongue midline   ASSESSMENT/PLAN:   Assessment & Plan Nonintractable headache, unspecified chronicity pattern, unspecified headache type Hx and exam are most c/w tension-type headache given distribution of pain in a band like pattern. Migraine and sinus headaches also considered. GCA is considered but less likely given patient age. Suspect that current exacerbation and headaches is related to increased life stress and poor sleep. -Continue Tylenol or ibuprofen as needed for headaches -Counseled on ED precautions -Treat sleep and mood as below. Poor sleep Differential includes MDD, poor sleep, menopausal vasomotor symtpoms, or a combination of the above. PHQ9 score 16.  Symptoms include lack of interest, trouble sleeping, moodiness. No SI. Will start lexapro to help with both depressive and menopausal symptoms.  Of note, patient is worried about worsening libido and sexual side effects that could put strain on her relationship. - Start lexapro 10mg  daily - Counseled on side effects including sexual dysfunction and GI upset.  Advised patient to reach out if these symptoms become too much to handle. - f/u in 4-6 weeks to reassess symptoms.    Lincoln Brigham, MD [facility]

## 2023-08-17 ENCOUNTER — Other Ambulatory Visit: Payer: Self-pay | Admitting: Obstetrics and Gynecology

## 2023-08-17 ENCOUNTER — Ambulatory Visit
Admission: RE | Admit: 2023-08-17 | Discharge: 2023-08-17 | Disposition: A | Discharge status: Home / Self Care | Payer: No Typology Code available for payment source | Source: Ambulatory Visit | Attending: Obstetrics and Gynecology | Admitting: Obstetrics and Gynecology

## 2023-08-17 DIAGNOSIS — N63 Unspecified lump in unspecified breast: Secondary | ICD-10-CM

## 2023-08-17 DIAGNOSIS — N6310 Unspecified lump in the right breast, unspecified quadrant: Secondary | ICD-10-CM

## 2023-08-18 ENCOUNTER — Ambulatory Visit
Admission: RE | Admit: 2023-08-18 | Discharge: 2023-08-18 | Disposition: A | Discharge status: Home / Self Care | Source: Ambulatory Visit | Attending: Obstetrics and Gynecology | Admitting: Obstetrics and Gynecology

## 2023-08-18 ENCOUNTER — Encounter: Payer: Self-pay | Admitting: Family Medicine

## 2023-08-18 DIAGNOSIS — N6310 Unspecified lump in the right breast, unspecified quadrant: Secondary | ICD-10-CM

## 2023-08-18 HISTORY — PX: BREAST BIOPSY: SHX20

## 2023-08-19 LAB — SURGICAL PATHOLOGY

## 2023-10-11 ENCOUNTER — Ambulatory Visit (INDEPENDENT_AMBULATORY_CARE_PROVIDER_SITE_OTHER): Payer: Self-pay | Admitting: Student

## 2023-10-11 ENCOUNTER — Ambulatory Visit: Payer: Self-pay | Attending: Family Medicine

## 2023-10-11 ENCOUNTER — Telehealth: Payer: Self-pay

## 2023-10-11 ENCOUNTER — Encounter: Payer: Self-pay | Admitting: Student

## 2023-10-11 VITALS — BP 132/79 | HR 71 | Ht 63.0 in | Wt 176.0 lb

## 2023-10-11 DIAGNOSIS — R002 Palpitations: Secondary | ICD-10-CM

## 2023-10-11 HISTORY — DX: Palpitations: R00.2

## 2023-10-11 NOTE — Patient Instructions (Addendum)
 It was great to see you today! Thank you for choosing [facility] for your primary care.  Today we addressed: EKG looks fine.  Lets get some blood work today.  I would like to get you set up with a heart monitor device for 2 weeks that we will get shipped to you.  I have also placed a referral to cardiology.  Should you have another 1 of these episodes of chest pain that lasts longer than 5 minutes it will not go away and radiates to your left arm, please go to the ED.  If you haven't already, sign up for My Chart to have easy access to your labs results, and communication with your primary care physician. We are checking some labs today. If they are abnormal, I will call you. If they are normal, I will send you a MyChart message (if it is active) or a letter in the mail. If you do not hear about your labs in the next 2 weeks, please call the office. Return if symptoms worsen or fail to improve. Please arrive 15 minutes before your appointment to ensure smooth check in process.  We appreciate your efforts in making this happen.  Thank you for allowing me to participate in your care, Veronia Goon, DO 10/11/2023, 4:31 PM PGY-3, [facility]
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
## 2023-10-11 NOTE — Assessment & Plan Note (Addendum)
 History concerning for cardiac involvement although limited episode frequency.  Family history of cardiac death also increases risk.  EKG NSR.  Prior lab work shows anemia is unlikely.  GAD-7 score of 0, PHQ-9 score of 2, may be panic attacks although I also suspect this is unlikely given no baseline anxiety. - Obtain lipid panel, TSH, A1c for risk stratification - Zio patch x 14 days - Cardiology referral - ED precautions discussed

## 2023-10-12 ENCOUNTER — Other Ambulatory Visit

## 2023-10-13 LAB — LIPID PANEL
Chol/HDL Ratio: 4.8 ratio — ABNORMAL HIGH (ref 0.0–4.4)
Cholesterol, Total: 198 mg/dL (ref 100–199)
HDL: 41 mg/dL (ref >39)
LDL Chol Calc (NIH): 100 mg/dL — ABNORMAL HIGH (ref 0–99)
Triglycerides: 335 mg/dL — ABNORMAL HIGH (ref 0–149)
VLDL Cholesterol Cal: 57 mg/dL — ABNORMAL HIGH (ref 5–40)

## 2023-10-13 LAB — TSH RFX ON ABNORMAL TO FREE T4: TSH: 1.37 u[IU]/mL (ref 0.450–4.500)

## 2023-10-14 ENCOUNTER — Encounter (HOSPITAL_COMMUNITY): Payer: Self-pay

## 2023-10-14 ENCOUNTER — Emergency Department (HOSPITAL_COMMUNITY)

## 2023-10-14 ENCOUNTER — Telehealth: Payer: Self-pay | Admitting: Family Medicine

## 2023-10-14 ENCOUNTER — Emergency Department (HOSPITAL_COMMUNITY)
Admission: EM | Admit: 2023-10-14 | Discharge: 2023-10-14 | Disposition: A | Discharge status: Home / Self Care | Attending: Emergency Medicine | Admitting: Emergency Medicine

## 2023-10-14 ENCOUNTER — Other Ambulatory Visit: Payer: Self-pay

## 2023-10-14 ENCOUNTER — Telehealth: Payer: Self-pay | Admitting: Student

## 2023-10-14 DIAGNOSIS — R0789 Other chest pain: Secondary | ICD-10-CM | POA: Insufficient documentation

## 2023-10-14 DIAGNOSIS — R079 Chest pain, unspecified: Secondary | ICD-10-CM

## 2023-10-14 LAB — BASIC METABOLIC PANEL WITH GFR
Anion gap: 9 (ref 5–15)
BUN: 8 mg/dL (ref 6–20)
CO2: 24 mmol/L (ref 22–32)
Calcium: 9.1 mg/dL (ref 8.9–10.3)
Chloride: 104 mmol/L (ref 98–111)
Creatinine, Ser: 0.65 mg/dL (ref 0.44–1.00)
GFR, Estimated: >60 mL/min (ref >60)
Glucose, Bld: 121 mg/dL — ABNORMAL HIGH (ref 70–99)
Potassium: 4.1 mmol/L (ref 3.5–5.1)
Sodium: 137 mmol/L (ref 135–145)

## 2023-10-14 LAB — CBC
HCT: 37 % (ref 36.0–46.0)
Hemoglobin: 12.3 g/dL (ref 12.0–15.0)
MCH: 30.8 pg (ref 26.0–34.0)
MCHC: 33.2 g/dL (ref 30.0–36.0)
MCV: 92.5 fL (ref 80.0–100.0)
Platelets: 313 10*3/uL (ref 150–400)
RBC: 4 MIL/uL (ref 3.87–5.11)
RDW: 13.1 % (ref 11.5–15.5)
WBC: 9.3 10*3/uL (ref 4.0–10.5)
nRBC: 0 % (ref 0.0–0.2)

## 2023-10-14 LAB — TROPONIN I (HIGH SENSITIVITY)
Troponin I (High Sensitivity): 3 ng/L (ref <18)
Troponin I (High Sensitivity): 9 ng/L (ref <18)

## 2023-10-14 LAB — D-DIMER, QUANTITATIVE: D-Dimer, Quant: <0.27 ug{FEU}/mL (ref 0.00–0.50)

## 2023-10-14 MED ORDER — DIAZEPAM 2 MG PO TABS: 5.0000 mg | ORAL_TABLET | Freq: Every evening | ORAL | 0 refills | Status: DC | PRN

## 2023-10-14 MED ORDER — DIAZEPAM 2 MG PO TABS
2.0000 mg | ORAL_TABLET | Freq: Once | ORAL | Status: AC
  Administered 2023-10-14: 2 mg via ORAL
  Filled 2023-10-14: qty 1

## 2023-10-14 MED ORDER — KETOROLAC TROMETHAMINE 30 MG/ML IJ SOLN
30.0000 mg | Freq: Once | INTRAMUSCULAR | Status: AC
  Administered 2023-10-14: 30 mg via INTRAVENOUS
  Filled 2023-10-14: qty 1

## 2023-10-14 MED ORDER — IBUPROFEN 600 MG PO TABS: 600.0000 mg | ORAL_TABLET | Freq: Three times a day (TID) | ORAL | 0 refills | Status: DC | PRN

## 2023-10-14 NOTE — Telephone Encounter (Signed)
 Called patient back regarding chest sensation. States she woke up with chest discomfort, not radiating to right arm but she feels heart palpitations and like she is running when she isn't. I reminded her of ED precautions we discussed at last visit and that since she is experiencing these symptoms, to proceed to ED. She states she will talk to her manager since she is at work.

## 2023-10-14 NOTE — ED Triage Notes (Signed)
 Pt reports intermittent left sided chest pain that radiates to her back and left arm since Sunday, She reports the tightness and burning in her chest again today around 0800. It has been intermittent today associated with shortness of breath.

## 2023-10-14 NOTE — Telephone Encounter (Signed)
-----   Message from [name] sent at 10/14/2023 11:05 AM EDT ----- Regarding: RE: Lab follow up Reached out to patient and got her scheduled with you on this coming Wednesday, May 28th. While on the phone, patient stated she had another episode of the needle-like sensation and pressure on her chest this morning and it just made her feel very unwell. She said after her last visit, she began to feel better and then this feeling came back this morning. She would like for you to call her regarding this when you get a moment.   Will also send this back in a telephone encounter for documentation purposes since staff messages do not show in chart. ----- Message ----- From: Veronia Goon, DO Sent: 10/14/2023   7:52 AM EDT To: [facility] Admin Subject: Lab follow up                                  Could you please call patient to setup appointment with me to discuss her lab results? Conversation better had in person, not emergent.
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
## 2023-10-14 NOTE — ED Provider Notes (Signed)
 [place] [facility] AT [facility] Provider Note   CSN: [id number] Arrival date & time: 10/14/23  1828     History  Chief Complaint  Patient presents with   Chest Pain    [name] is a 43 y.o. female presenting to the ER with acute intermittent left-sided chest pain for the past 2 weeks.  She describes it as a burning and stabbing sensation the left side of her chest that sometimes radiates towards her left back and down her left arm.  It is often associated with stress and exertion is somewhat improved at rest, but not persistently.  Her doctor told her to come to the ED.  She denies prior history of coronary disease but reports she was told she had an arrhythmia in the past.  She currently is mild to moderate discomfort sensation in her chest.  She describes it sometimes as a pinching sensation, sometimes feeling like she can see her heart "fluttering under my skin".  She said her PCP had just arranged for her to wear a Holter monitor and she just received a device today but has not worn it yet.  HPI     Home Medications Prior to Admission medications   Medication Sig Start Date End Date Taking? Authorizing Provider  diazepam  (VALIUM ) 2 MG tablet Take 2.5 tablets (5 mg total) by mouth at bedtime as needed for up to 12 doses for anxiety. 10/14/23  Yes Arvilla Birmingham, MD  fluticasone  (FLONASE ) 50 MCG/ACT nasal spray Place 2 sprays into both nostrils daily. 06/18/21  Yes Padgett, Rhoderick Ceo, MD  ibuprofen  (ADVIL ) 600 MG tablet Take 1 tablet (600 mg total) by mouth every 8 (eight) hours as needed for up to 30 doses. 10/14/23  Yes Arvilla Birmingham, MD  metFORMIN  (GLUCOPHAGE -XR) 500 MG 24 hr tablet Take 2 tablets (1,000 mg total) by mouth daily with breakfast. 11/09/22  Yes Santana Cue, MD  cetirizine  (ZYRTEC ) 10 MG tablet Take 1 tablet (10 mg total) by mouth daily. Patient not taking: Reported on 10/14/2023 06/05/21   Geoffry Kill, MD   escitalopram  (LEXAPRO ) 10 MG tablet Take 1 tablet (10 mg total) by mouth daily. Patient not taking: Reported on 10/14/2023 08/15/23   Albin Huh, MD      Allergies    Amoxicillin , Penicillins, and Shellfish allergy    Review of Systems   Review of Systems  Physical Exam Updated Vital Signs BP 131/78   Pulse 74   Temp 98.4 F (36.9 C)   Resp 18   Ht 5\' 3"  (1.6 m)   Wt 79.8 kg   LMP 09/23/2023 (Approximate)   SpO2 100%   BMI 31.18 kg/m  Physical Exam Constitutional:      General: She is not in acute distress. HENT:     Head: Normocephalic and atraumatic.  Eyes:     Conjunctiva/sclera: Conjunctivae normal.     Pupils: Pupils are equal, round, and reactive to light.  Cardiovascular:     Rate and Rhythm: Normal rate and regular rhythm.  Pulmonary:     Effort: Pulmonary effort is normal. No respiratory distress.  Abdominal:     General: There is no distension.     Tenderness: There is no abdominal tenderness.  Skin:    General: Skin is warm and dry.  Neurological:     General: No focal deficit present.     Mental Status: She is alert. Mental status is at baseline.  Psychiatric:  Mood and Affect: Mood normal.        Behavior: Behavior normal.     ED Results / Procedures / Treatments   Labs (all labs ordered are listed, but only abnormal results are displayed) Labs Reviewed  BASIC METABOLIC PANEL WITH GFR - Abnormal; Notable for the following components:      Result Value   Glucose, Bld 121 (*)    All other components within normal limits  CBC  D-DIMER, QUANTITATIVE  TROPONIN I (HIGH SENSITIVITY)  TROPONIN I (HIGH SENSITIVITY)    EKG None  Radiology DG Chest 1 View Result Date: 10/14/2023 CLINICAL DATA:  Chest pain. EXAM: CHEST  1 VIEW COMPARISON:  04/20/2022 FINDINGS: The cardiomediastinal contours are normal. The lungs are clear. Pulmonary vasculature is normal. No consolidation, pleural effusion, or pneumothorax. No acute osseous abnormalities  are seen. IMPRESSION: No active disease. Electronically Signed   By: Chadwick Colonel M.D.   On: 10/14/2023 19:29    Procedures Procedures    Medications Ordered in ED Medications  ketorolac  (TORADOL ) 30 MG/ML injection 30 mg (30 mg Intravenous Given 10/14/23 2125)  diazepam  (VALIUM ) tablet 2 mg (2 mg Oral Given 10/14/23 2322)    ED Course/ Medical Decision Making/ A&P                                 Medical Decision Making Amount and/or Complexity of Data Reviewed Labs: ordered.  Risk Prescription drug management.   This patient presents to the Emergency Department with complaint of chest pain. This involves an extensive number of treatment options, and is a complaint that carries with it a high risk of complications and morbidity, given the patient's comorbidity, including high blood pressure and high cholesterol.The differential diagnosis includes ACS vs Pneumothorax vs Reflux/Gastritis vs MSK pain vs Pneumonia vs other.  I felt PE was less likely given that D-dimer is negative and the patient does not have other acute PE risk factors  I ordered, reviewed, and interpreted labs.  Pertinent results include no emergent findings.  D-dimer negative and delta troponin is negative I ordered medication Toradol  and Valium  for chest pain -for potential musculoskeletal pain or vasospasm I ordered imaging studies which included x-ray of the chest I independently visualized and interpreted imaging which showed no emergent findings and the monitor tracing which showed some rhythm. I agree with the radiologist interpretation I personally reviewed the patients ECG which showed sinus rhythm with no acute ischemic findings  After the interventions stated above, I reevaluated the patient and found that they were not having much improvement after the Toradol .  I question the possibility of coronary vasospasms and I think is reasonable to try Valium .  This is based on the description she is telling  me of sharp, sudden clenching pain.  Esophageal spasms could also cause this and could be responsive to Valium .  I do not suspect this is gastritis or esophagitis as she has no prior history of this and reports she had an EGD done recently was told that everything looks normal.  Based on the patient's clinical exam, vital signs, risk factors, and ED testing, I felt that the patient's overall risk of life-threatening emergency such as ACS, PE, sepsis, or infection was low.  At this time, I felt the patient's presentation was most clinically consistent with nonspecific chest pain, but explained to the patient that this evaluation was not a definitive diagnostic workup.  I did refer  the patient to cardiology for further potential cardiac workup given her risk factors, overall HEART score low of 3, reasonable for outpatient follow-up         Final Clinical Impression(s) / ED Diagnoses Final diagnoses:  Chest pain, unspecified type    Rx / DC Orders ED Discharge Orders          Ordered    Ambulatory referral to Cardiology        10/14/23 2302    diazepam  (VALIUM ) 2 MG tablet  At bedtime PRN        10/14/23 2313    ibuprofen  (ADVIL ) 600 MG tablet  Every 8 hours PRN        10/14/23 2313              Arvilla Birmingham, MD 10/15/23 0004

## 2023-10-14 NOTE — Telephone Encounter (Signed)
 Received staff message from [facility] to reach out to patient and schedule a follow up visit to go over blood work from last visit. After scheduling the appointment, patient stated she had another episode of the needle-like sensation and pressure on her chest this morning and it just made her feel very unwell. She said after her last visit, she began to feel better and then this feeling came back this morning. She would like for [facility] to call her regarding this when he gets a moment. Please advise.  Responded to staff message with same information as above, but also put in telephone encounter for documentation purposes since staff messages do not go into patients chart.

## 2023-10-18 NOTE — Progress Notes (Unsigned)
  SUBJECTIVE:   CHIEF COMPLAINT / HPI:   Presents today for discussion regarding cholesterol.   She has cardiology appointment June 3rd with cardiology.  She notes that the Valium  given by ED has been helpful and they stated that she most likely has muscle spasms although needs cardiology follow-up in order to properly diagnose this.  She is currently wearing her Zio patch.  PERTINENT  PMH / PSH: prediabetes, HLD  OBJECTIVE:  BP 137/80   Pulse 82   Ht 5\' 3"  (1.6 m)   Wt 177 lb (80.3 kg)   LMP 09/23/2023 (Approximate)   SpO2 98%   BMI 31.35 kg/m  Gen: well-appearing, NAD  ASSESSMENT/PLAN:   Assessment & Plan Mixed hyperlipidemia LDL 100, non-fasting triglycerides 335 and has been fairly elevated for the last couple years. PREVENT score 1.78% for 10-year total CVD risk. Recommend statin initiation for tryglycerides primarily.  Recheck in 2 to 3 months. Prediabetes A1c 6.2. Continue Metformin -XR 1000mg  daily. Heart palpitations Appropriate follow-up with cardiology next week, wearing Zio patch.  Reviewed ED workup, negative for ACS. Return in about 10 weeks (around 12/28/2023) for recheck triglycerides. Veronia Goon, DO 10/19/2023, 9:04 AM PGY-3, [facility]

## 2023-10-19 ENCOUNTER — Ambulatory Visit (INDEPENDENT_AMBULATORY_CARE_PROVIDER_SITE_OTHER): Payer: Self-pay | Admitting: Student

## 2023-10-19 VITALS — BP 137/80 | HR 82 | Ht 63.0 in | Wt 177.0 lb

## 2023-10-19 DIAGNOSIS — R002 Palpitations: Secondary | ICD-10-CM

## 2023-10-19 DIAGNOSIS — R7303 Prediabetes: Secondary | ICD-10-CM

## 2023-10-19 DIAGNOSIS — E782 Mixed hyperlipidemia: Secondary | ICD-10-CM

## 2023-10-19 LAB — POCT GLYCOSYLATED HEMOGLOBIN (HGB A1C): HbA1c, POC (controlled diabetic range): 6.2 % (ref 0.0–7.0)

## 2023-10-19 MED ORDER — ATORVASTATIN CALCIUM 10 MG PO TABS: 10.0000 mg | ORAL_TABLET | Freq: Every day | ORAL | 1 refills | Status: DC

## 2023-10-19 NOTE — Assessment & Plan Note (Signed)
 Appropriate follow-up with cardiology next week, wearing Zio patch.  Reviewed ED workup, negative for ACS.

## 2023-10-19 NOTE — Patient Instructions (Addendum)
 It was great to see you today! Thank you for choosing [facility] for your primary care.  Today we addressed: Your A1c was 6.2%. Prediabetes, continue weight loss efforts.  Starting statin for high triglycerides. Recheck in 2-3 months.  If you haven't already, sign up for My Chart to have easy access to your labs results, and communication with your primary care physician.  Return in about 10 weeks (around 12/28/2023) for recheck triglycerides. Please arrive 15 minutes before your appointment to ensure smooth check in process.  We appreciate your efforts in making this happen.  Thank you for allowing me to participate in your care, Veronia Goon, DO 10/19/2023, 8:59 AM PGY-3, [facility]

## 2023-10-19 NOTE — Assessment & Plan Note (Signed)
 A1c 6.2. Continue Metformin -XR 1000mg  daily.

## 2023-10-19 NOTE — Assessment & Plan Note (Addendum)
 LDL 100, non-fasting triglycerides 335 and has been fairly elevated for the last couple years. PREVENT score 1.78% for 10-year total CVD risk. Recommend statin initiation for tryglycerides primarily.  Recheck in 2 to 3 months.

## 2023-10-21 DIAGNOSIS — E119 Type 2 diabetes mellitus without complications: Secondary | ICD-10-CM | POA: Insufficient documentation

## 2023-10-21 DIAGNOSIS — T783XXA Angioneurotic edema, initial encounter: Secondary | ICD-10-CM | POA: Insufficient documentation

## 2023-10-21 DIAGNOSIS — L309 Dermatitis, unspecified: Secondary | ICD-10-CM | POA: Insufficient documentation

## 2023-10-21 DIAGNOSIS — Z8632 Personal history of gestational diabetes: Secondary | ICD-10-CM | POA: Insufficient documentation

## 2023-10-21 DIAGNOSIS — T8859XA Other complications of anesthesia, initial encounter: Secondary | ICD-10-CM | POA: Insufficient documentation

## 2023-10-21 DIAGNOSIS — G971 Other reaction to spinal and lumbar puncture: Secondary | ICD-10-CM | POA: Insufficient documentation

## 2023-10-21 DIAGNOSIS — L509 Urticaria, unspecified: Secondary | ICD-10-CM | POA: Insufficient documentation

## 2023-10-21 DIAGNOSIS — Z8489 Family history of other specified conditions: Secondary | ICD-10-CM | POA: Insufficient documentation

## 2023-10-25 ENCOUNTER — Ambulatory Visit: Payer: Self-pay

## 2023-10-25 ENCOUNTER — Other Ambulatory Visit: Payer: Self-pay

## 2023-10-25 VITALS — BP 146/82 | HR 64 | Ht 63.0 in | Wt 175.0 lb

## 2023-10-25 DIAGNOSIS — I10 Essential (primary) hypertension: Secondary | ICD-10-CM

## 2023-10-25 DIAGNOSIS — R072 Precordial pain: Secondary | ICD-10-CM

## 2023-10-25 DIAGNOSIS — I779 Disorder of arteries and arterioles, unspecified: Secondary | ICD-10-CM | POA: Insufficient documentation

## 2023-10-25 DIAGNOSIS — E782 Mixed hyperlipidemia: Secondary | ICD-10-CM

## 2023-10-25 DIAGNOSIS — R002 Palpitations: Secondary | ICD-10-CM

## 2023-10-25 DIAGNOSIS — R079 Chest pain, unspecified: Secondary | ICD-10-CM

## 2023-10-25 DIAGNOSIS — R55 Syncope and collapse: Secondary | ICD-10-CM

## 2023-10-25 DIAGNOSIS — I6523 Occlusion and stenosis of bilateral carotid arteries: Secondary | ICD-10-CM

## 2023-10-25 HISTORY — DX: Essential (primary) hypertension: I10

## 2023-10-25 HISTORY — DX: Disorder of arteries and arterioles, unspecified: I77.9

## 2023-10-25 MED ORDER — METOPROLOL TARTRATE 50 MG PO TABS: 50.0000 mg | ORAL_TABLET | Freq: Once | ORAL | 0 refills | Status: DC

## 2023-10-25 MED ORDER — ATORVASTATIN CALCIUM 20 MG PO TABS: 20.0000 mg | ORAL_TABLET | Freq: Every day | ORAL | 3 refills | Status: AC

## 2023-10-25 MED ORDER — CARVEDILOL 3.125 MG PO TABS: 3.1250 mg | ORAL_TABLET | Freq: Two times a day (BID) | ORAL | 3 refills | Status: DC

## 2023-10-25 MED ORDER — PREDNISONE 50 MG PO TABS: ORAL_TABLET | ORAL | 0 refills | Status: DC

## 2023-10-25 MED ORDER — ASPIRIN 81 MG PO TBEC: 81.0000 mg | DELAYED_RELEASE_TABLET | Freq: Every day | ORAL | 3 refills | Status: DC

## 2023-10-25 NOTE — Patient Instructions (Signed)
 Medication Instructions:  Your physician has recommended you make the following change in your medication:   START: Aspirin 81 mg daily START: Atorvastatin  20 mg daily START: Carvedilol 3.125 mg two times daily  *If you need a refill on your cardiac medications before your next appointment, please call your pharmacy*  Lab Work: Your physician recommends that you return for lab work in:   Labs today: BMP  If you have labs (blood work) drawn today and your tests are completely normal, you will receive your results only by: MyChart Message (if you have MyChart) OR A paper copy in the mail If you have any lab test that is abnormal or we need to change your treatment, we will call you to review the results.  Testing/Procedures: Your physician has requested that you have an echocardiogram. Echocardiography is a painless test that uses sound waves to create images of your heart. It provides your doctor with information about the size and shape of your heart and how well your heart's chambers and valves are working. This procedure takes approximately one hour. There are no restrictions for this procedure. Please do NOT wear cologne, perfume, aftershave, or lotions (deodorant is allowed). Please arrive 15 minutes prior to your appointment time.  Please note: We ask at that you not bring children with you during ultrasound (echo/ vascular) testing. Due to room size and safety concerns, children are not allowed in the ultrasound rooms during exams. Our front office staff cannot provide observation of children in our lobby area while testing is being conducted. An adult accompanying a patient to their appointment will only be allowed in the ultrasound room at the discretion of the ultrasound technician under special circumstances. We apologize for any inconvenience.  Your physician has requested that you have a carotid duplex. This test is an ultrasound of the carotid arteries in your neck. It looks at  blood flow through these arteries that supply the brain with blood. Allow one hour for this exam. There are no restrictions or special instructions.    Your cardiac CT will be scheduled at one of the below locations:   [facility] [address] [place], [place] [address] [phone]  OR  [facility] [address] [place], [place] [address] [phone]  OR   [facility] [address] [place], [place] [address] [phone]  OR   [facility] [address] [place], [place] [address] [phone]  OR   [facility] and Vascular Tower [address]  [place], [place] [address] Opening September 19, 2023  If scheduled at [facility], please arrive at the [facility] and Children's Entrance (Entrance C2) of [facility] 30 minutes prior to test start time. You can use the FREE valet parking offered at entrance C (encouraged to control the heart rate for the test)  Proceed to the [facility] Radiology Department (first floor) to check-in and test prep.   All radiology patients and guests should use entrance C2 at [facility], accessed from [facility], even though the hospital's physical address listed is [address].    If scheduled at the Heart and Vascular Tower at [address], please enter the parking lot using the [address] entrance and use the FREE valet service at the patient drop-off area. Enter the buidling and check-in with registration on the main floor.  If scheduled at [facility]  or [facility], please arrive 15 mins early for check-in and test prep.  There is spacious parking and easy access to the radiology department from the [facility] Heart and Vascular entrance. Please enter here and check-in with the desk attendant.   If scheduled at [facility], please arrive 30 minutes early for check-in and test prep.  Please follow these instructions carefully (unless otherwise directed):  An IV will be required for this test and Nitroglycerin will be given.  Hold all erectile dysfunction medications at least 3 days (72 hrs) prior to test. (Ie viagra, cialis, sildenafil, tadalafil, etc)   On the Night Before the Test: Be sure to Drink plenty of water . Do not consume any caffeinated/decaffeinated beverages or chocolate 12 hours prior to your test. Do not take any antihistamines 12 hours prior to your test. If the patient has contrast allergy: Patient will need a prescription for Prednisone  and very clear instructions (as follows): Prednisone  50 mg - take 13 hours prior to test Take another Prednisone  50 mg 7 hours prior to test Take another Prednisone  50 mg 1 hour prior to test Take Benadryl  50 mg 1 hour prior to test Patient must complete all four doses of above prophylactic medications. Patient will need a ride after test due to Benadryl .  On the Day of the Test: Drink plenty of water  until 1 hour prior to the test. Do not eat any food 1 hour prior to test. You may take your regular medications prior to the test.  Take metoprolol (Lopressor) two hours prior to test. Patients who wear a continuous glucose monitor MUST remove the device prior to scanning. FEMALES- please wear underwire-free bra if available, avoid dresses & tight clothing      After the Test: Drink plenty of water . After receiving IV contrast, you may experience a mild flushed feeling. This is normal. On occasion, you may experience a mild rash up to 24 hours after the test. This is not dangerous. If this occurs, you can take Benadryl  25 mg, Zyrtec , Claritin, or Allegra and increase your fluid intake. (Patients taking Tikosyn should avoid Benadryl , and may take Zyrtec , Claritin, or Allegra) If you experience trouble breathing, this can be serious. If it is severe  call 911 IMMEDIATELY. If it is mild, please call our office.  We will call to schedule your test 2-4 weeks out understanding that some insurance companies will need an authorization prior to the service being performed.   For more information and frequently asked questions, please visit our website : [email or web address]  For non-scheduling related questions, please contact the cardiac imaging nurse navigator should you have any questions/concerns: Cardiac Imaging Nurse Navigators Direct Office Dial: [phone]   For scheduling needs, including cancellations and rescheduling, please call [place], [phone].   Follow-Up: At [facility], you and your health needs are our priority.  As part of our continuing mission to provide you with exceptional heart care, our providers are all part of one team.  This team includes your primary Cardiologist (physician) and Advanced Practice Providers or APPs (Physician Assistants and Nurse Practitioners) who all work together to provide you with the care you need, when you need it.  Your next appointment:   2 month(s)  Provider:   Bertha Broad, MD    We recommend signing up for the patient portal called "MyChart".  Sign up information is provided on this After Visit Summary.  MyChart is used to connect with patients for Virtual Visits (Telemedicine).  Patients are able to view lab/test results, encounter notes, upcoming appointments, etc.  Non-urgent messages can be sent to your provider as well.   To learn more about what you can do with MyChart, go to [email or web address].   Other Instructions None

## 2023-10-25 NOTE — Assessment & Plan Note (Signed)
 History of chest pain on and off associated with palpitations and also occur at other times without palpitations.  Atypical nature. Has significant family history for premature coronary artery disease.  Recommend starting aspirin 81 mg once daily. Will obtain transthoracic echocardiogram for structure and function assessment. For coronary artery disease risk assessment stress test versus cardiac CT was reviewed.  Given her ongoing symptoms we will proceed with cardiac CT coronary angiogram to rule out any obstructive disease.  Advised to head to the ER or call 911 for any persistent symptoms of chest pain or shortness of breath.

## 2023-10-25 NOTE — Progress Notes (Signed)
 Cardiology Consultation:    Date:  10/25/2023   ID:  [name], DOB [birth year], MRN [id number]  PCP:  Clyda Dark, DO  Cardiologist:  Daymon Evans Dedee Liss, MD   Referring MD: Azell Boll, MD   No chief complaint on file.    ASSESSMENT AND PLAN:   [name] 43 year old woman with no significant prior cardiac history has noted to have hypertriglyceridemia, prediabetes, palpitations, elevated blood pressures, mild nonobstructive carotid artery disease on ultrasound September 2018, social alcohol consumption, non-smoker now presenting with symptoms of atypical chest pain and associated shortness of breath, palpitations and near syncopal episodes. Zio patch ordered by PCP is currently on.  Problem List Items Addressed This Visit     Chest pain of uncertain etiology - Primary   History of chest pain on and off associated with palpitations and also occur at other times without palpitations.  Atypical nature. Has significant family history for premature coronary artery disease.  Recommend starting aspirin 81 mg once daily. Will obtain transthoracic echocardiogram for structure and function assessment. For coronary artery disease risk assessment stress test versus cardiac CT was reviewed.  Given her ongoing symptoms we will proceed with cardiac CT coronary angiogram to rule out any obstructive disease.  Advised to head to the ER or call 911 for any persistent symptoms of chest pain or shortness of breath.       Hyperlipidemia    hypertriglyceridemia on recent blood work from 10-12-2023 triglycerides 335, LDL 100, HDL 41, total cholesterol 782.  Continue with atorvastatin  increase the dose up to 20 mg once daily. Educated about strict diet modifications to target reducing hypertriglycerides.      Heart palpitations   Intermittent palpitations occurring randomly and lasting for very duration associated with chest pain shortness of breath and symptoms of exhaustion rest of the  day.  Will review Zio patch results once available. Obtaining echocardiogram to rule out cardiac structure and function abnormalities.      Relevant Orders   EKG 12-Lead (Completed)   Near syncope   Near syncopal episode recently on Sunday. Associated with palpitations and resting cardiac symptoms as discussed below. Continue Zio patch monitor and will review results once available.  Cardiac structure and function assessment with transthoracic echocardiogram.       Carotid artery disease (HCC)   Mild less than 39% atherosclerosis of bilateral internal carotids on prior ultrasound from September 2018.  With ongoing symptoms of near syncope and lightheadedness at times, will review for any significant interval change with repeat ultrasound carotids.       Hypertension   Elevated blood pressure on recent multiple healthcare visits.  Target blood pressure below 130/80 mmHg. Recommended starting carvedilol 3.125 mg twice daily in the setting of elevated blood pressures and palpitations.  Discussed potential side effects of lightheadedness dizziness, slow heart rates, etc.        Return to clinic tentatively in 2 months.   History of Present Illness:    [name] is a 43 y.o. female who is being seen today for the evaluation of palpitations and atypical left-sided chest pain at the request of Azell Boll, MD. Very pleasant woman here for the visit by herself.  Works at [employer].  History of hypertriglyceridemia, prediabetes, palpitations, elevated blood pressures, mild nonobstructive carotid disease on ultrasound September 2018, social alcohol consumption occasionally, non-smoker.  She had been dealing with symptoms of palpitations on and off occurring randomly associated with sharp chest pain and burning sensation over  the precordium radiating to the neck and the left arm.  Zio patch was ordered by PCP and is currently on.  She recently had a visit to the ER  10-14-2023 for chest pain, evaluation with troponins was unremarkable, D-dimer was unremarkable.  This was also associated with a sense of fluttering in the chest.  Blood pressures were notably elevated in the ER.  Mentions she continues to have ongoing episodes of these occurring intermittently.  Last episode occurred on Sunday which was intense and she was exhausted rest of the day. Describes the episodes as a sharp to burning kind of sensation over the precordium radiating to the left arm and associated with a fast heartbeat sensation.  On Sunday this was notably associated with shortness of breath and sweating.  Remained exhausted rest of the day.  Was notably feeling brain fog. At times feels visual changes where she is about to pass out. Denies any syncope however.  Has been started on atorvastatin  recently for hypertriglyceridemia 10 mg once daily and tolerating well.  Blood pressures at home typically elevated using her home cuff.  But at times the numbers are also normal.  EKG in the clinic today shows sinus rhythm heart rate 64/min, PR interval 136 ms, QRS duration 74 ms, QTc normal 466 ms.  Family history cousin died in his 34s from MI recently. Dawn paternal side with heart disease at a young age. Father had MI in his 77s.   Blood work from 10-19-2023 notes hemoglobin A1c 6.3, suggest prediabetes CBC from 10-14-2023 unremarkable with hemoglobin 12.3, hematocrit 37 BUN 8, creatinine 0.65, EGFR greater than 60. Thyroid  panel from 10-12-2023 was normal with TSH 1.37. Lipid panel from 10-12-2023 total cholesterol 198, triglycerides 335, LDL 100, HDL 41.  Her previous triglycerides levels were also consistently elevated.  Suggest hypertriglyceridemia   Ultrasound carotids from September 2018 showed less than 39% stenosis of bilateral internal carotid arteries.  Patent vertebral arteries with antegrade flow. 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   Cystic thyroid  nodule 07/03/2014   Diabetes mellitus without complication (HCC)    Dorsal wrist ganglion 05/20/2021   Dyspnea 04/20/2022   Eczema    Family history of anesthesia complication    Mother had problem waking up   Fatty liver disease, nonalcoholic 09/18/2009   Qualifier: Diagnosis of   By: Veatrice Georgis MD, Amalia Badder         Flank pain 03/29/2021   Ganglion cyst of dorsum of right wrist    Ganglion cyst of volar aspect of right wrist 04/30/2022   Heart palpitations 10/11/2023   Hematochezia 03/08/2023   Hoarseness of voice 05/22/2020   Hx gestational diabetes    Hyperlipidemia 05/20/2021   Hypochondriasis 08/19/2014   She has multiple somatic complaints, and preoccupation with disease processes.   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      09/2016 :   IMPRESSION:  1. No acute intracranial abnormality identified.  2. Diminished 2 mm focus of hypoenhancement within left posterior pituitary may represent scarring or residual microadenoma. No new pituitary mass identified.  3. Otherwise unremarkable MRI of the brain for age.         Prediabetes 04/02/2014   Right lower quadrant abdominal pain 05/07/2022   RUQ pain 02/02/2012   Seasonal allergies 10/24/2012   Spinal headache    Thyroid  fullness 04/02/2014   Upper respiratory tract infection 04/20/2022   Urticaria    Vaginal discharge 01/08/2023   Vaginal lesion 11/13/2020   Vision changes 03/01/2021   Vulvar lesion 01/08/2023    Past Surgical History:  Procedure Laterality Date   BREAST BIOPSY Right 08/18/2023   US  RT BREAST BX W LOC DEV 1ST LESION IMG BX SPEC US  GUIDE 08/18/2023 [facility]MO[facility]   CESAREAN SECTION     CHOLECYSTECTOMY N/A 09/06/2013   Procedure: LAPAROSCOPIC CHOLECYSTECTOMY;  Surgeon: Rogena Class, MD;  Location: [facility] OR;  Service: General;  Laterality: N/A;   GANGLION CYST EXCISION Right 08/03/2021   Procedure: RIGHT REMOVAL GANGLION OF WRIST;  Surgeon: Marilyn Shropshire, MD;  Location: [facility];  Service: Orthopedics;  Laterality: Right;  regional with monitored anesthesia care   GANGLION CYST EXCISION Right 05/26/2022   Procedure: RIGHT WRIST VOLAR GANGLION CYST REMOVAL;  Surgeon: Wes Hamman, MD;  Location: [facility];  Service: Orthopedics;  Laterality: Right;   NASAL SINUS SURGERY  2003   TUBAL LIGATION      Current Medications: Current Meds  Medication Sig   cetirizine  (ZYRTEC ) 10 MG tablet Take 1 tablet (10 mg total) by mouth daily. (Patient taking differently: Take 10 mg by mouth as needed for allergies or rhinitis.)   fluticasone  (FLONASE ) 50 MCG/ACT nasal spray Place 2 sprays into both nostrils daily.   metFORMIN  (GLUCOPHAGE -XR) 500 MG 24 hr tablet Take 2 tablets (1,000 mg total) by mouth daily with breakfast.   [DISCONTINUED] atorvastatin  (LIPITOR) 10 MG tablet Take 1 tablet (10 mg total) by mouth daily.     Allergies:   Amoxicillin , Penicillins, and Shellfish allergy   Social History   Socioeconomic History   Marital status: Married    Spouse name: Not on file   Number of children: 3    Years of education: Not on file   Highest education level: GED or equivalent  Occupational History   Not on file  Tobacco Use   Smoking status: Never    Passive exposure: Never   Smokeless tobacco: Never  Vaping Use   Vaping status: Never Used  Substance and Sexual Activity   Alcohol use: Yes    Comment: occ.   Drug use: No   Sexual activity: Yes    Birth control/protection: Surgical  Other Topics Concern   Not on file  Social History Narrative   Not on file   Social Drivers of Health   Financial Resource Strain: Medium Risk (05/12/2023)   Overall Financial Resource Strain (CARDIA)    Difficulty of Paying Living Expenses: Somewhat hard  Food Insecurity: No Food Insecurity (05/12/2023)   Hunger Vital Sign    Worried About Running Out of  Food in the Last Year: Never true    Ran Out of Food in the Last Year: Never true  Transportation Needs: No Transportation Needs (05/12/2023)   PRAPARE - Administrator, Civil Service (Medical): No    Lack of Transportation (Non-Medical): No  Physical Activity: Inactive (05/12/2023)   Exercise Vital Sign    Days of Exercise per Week: 0 days    Minutes of Exercise per Session: 30 min  Stress: No Stress Concern Present (05/12/2023)   [employer] of Occupational Health - Occupational Stress Questionnaire    Feeling of Stress : Only a little  Social Connections: Moderately Integrated (05/12/2023)   Social Connection and Isolation Panel [NHANES]    Frequency of Communication with Friends and Family: Three times a week    Frequency of Social Gatherings with Friends and Family: Once a week    Attends Religious Services: More than 4 times per year    Active Member of [employer] or Organizations: No    Attends Engineer, structural: Not on file    Marital Status: Married  Recent Concern: Social Connections - Moderately Isolated (03/07/2023)   Social Connection and Isolation Panel [NHANES]    Frequency of Communication  with Friends and Family: Once a week    Frequency of Social Gatherings with Friends and Family: Once a week    Attends Religious Services: 1 to 4 times per year    Active Member of [employer] or Organizations: No    Attends Engineer, structural: Not on file    Marital Status: Married     Family History: The patient's family history includes Allergic rhinitis in her daughter, daughter, and son; Breast cancer in an other family member; Cervical cancer in her mother; Diabetes in her father and another family member; Hyperlipidemia in her mother; Hypertension in her father and mother; Irregular heart beat in her father; Prostate cancer in her maternal grandfather; Throat cancer in her maternal grandmother. There is no history of Stomach cancer, Colon cancer, Esophageal cancer, or Pancreatic cancer. ROS:   Please see the history of present illness.    All 14 point review of systems negative except as described per history of present illness.  EKGs/Labs/Other Studies Reviewed:    The following studies were reviewed today:   EKG:  EKG Interpretation Date/Time:  Tuesday October 25 2023 09:05:46 EDT Ventricular Rate:  64 PR Interval:  136 QRS Duration:  74 QT Interval:  452 QTC Calculation: 466 R Axis:   74  Text Interpretation:  Normal sinus rhythm Normal ECG When compared with ECG of 14-Oct-2023 18:44, No significant change was found  Confirmed by Bertha Broad reddy [phone]) on 10/25/2023 9:33:05 AM    Recent Labs: 08/09/2023: ALT 18 10/12/2023: TSH 1.370 10/14/2023: BUN 8; Creatinine, Ser 0.65; Hemoglobin 12.3; Platelets 313; Potassium 4.1; Sodium 137  Recent Lipid Panel    Component Value Date/Time   CHOL 198 10/12/2023 1101   TRIG 335 (H) 10/12/2023 1101   HDL 41 10/12/2023 1101   CHOLHDL 4.8 (H) 10/12/2023 1101   CHOLHDL 4.1 04/25/2015 1000   VLDL 25 04/25/2015 1000   LDLCALC 100 (H) 10/12/2023 1101   LDLDIRECT 103 (H) 11/09/2022 0953    Physical Exam:    VS:  BP  (!) 146/82   Pulse 64   Ht 5\' 3"  (1.6 m)   Wt 175 lb (79.4 kg)   LMP 09/23/2023 (Approximate)   SpO2 99%   BMI 31.00 kg/m  Wt Readings from Last 3 Encounters:  10/25/23 175 lb (79.4 kg)  10/19/23 177 lb (80.3 kg)  10/14/23 176 lb (79.8 kg)     GENERAL:  Well nourished, well developed in no acute distress NECK: No JVD; No carotid bruits CARDIAC: RRR, S1 and S2 present, no murmurs, no rubs, no gallops CHEST:  Clear to auscultation without rales, wheezing or rhonchi  Extremities: No pitting pedal edema. Pulses bilaterally symmetric with radial 2+ and dorsalis pedis 2+ NEUROLOGIC:  Alert and oriented x 3  Medication Adjustments/Labs and Tests Ordered: Current medicines are reviewed at length with the patient today.  Concerns regarding medicines are outlined above.  Orders Placed This Encounter  Procedures   EKG 12-Lead   No orders of the defined types were placed in this encounter.   Signed, Lura Sallies, MD, MPH, [facility]. 10/25/2023 10:04 AM    [facility]

## 2023-10-25 NOTE — Assessment & Plan Note (Signed)
 Intermittent palpitations occurring randomly and lasting for very duration associated with chest pain shortness of breath and symptoms of exhaustion rest of the day.  Will review Zio patch results once available. Obtaining echocardiogram to rule out cardiac structure and function abnormalities.

## 2023-10-25 NOTE — Assessment & Plan Note (Signed)
 Mild less than 39% atherosclerosis of bilateral internal carotids on prior ultrasound from September 2018.  With ongoing symptoms of near syncope and lightheadedness at times, will review for any significant interval change with repeat ultrasound carotids.

## 2023-10-25 NOTE — Assessment & Plan Note (Signed)
 Near syncopal episode recently on Sunday. Associated with palpitations and resting cardiac symptoms as discussed below. Continue Zio patch monitor and will review results once available.  Cardiac structure and function assessment with transthoracic echocardiogram.

## 2023-10-25 NOTE — Assessment & Plan Note (Signed)
 Elevated blood pressure on recent multiple healthcare visits.  Target blood pressure below 130/80 mmHg. Recommended starting carvedilol 3.125 mg twice daily in the setting of elevated blood pressures and palpitations.  Discussed potential side effects of lightheadedness dizziness, slow heart rates, etc.

## 2023-10-25 NOTE — Assessment & Plan Note (Addendum)
 hypertriglyceridemia on recent blood work from 10-12-2023 triglycerides 335, LDL 100, HDL 41, total cholesterol 161.  Continue with atorvastatin  increase the dose up to 20 mg once daily. Educated about strict diet modifications to target reducing hypertriglycerides.

## 2023-10-26 ENCOUNTER — Telehealth (HOSPITAL_BASED_OUTPATIENT_CLINIC_OR_DEPARTMENT_OTHER): Payer: Self-pay

## 2023-10-26 LAB — BASIC METABOLIC PANEL WITH GFR
BUN/Creatinine Ratio: 19 (ref 9–23)
BUN: 12 mg/dL (ref 6–24)
CO2: 19 mmol/L — ABNORMAL LOW (ref 20–29)
Calcium: 9.1 mg/dL (ref 8.7–10.2)
Chloride: 103 mmol/L (ref 96–106)
Creatinine, Ser: 0.64 mg/dL (ref 0.57–1.00)
Glucose: 101 mg/dL — ABNORMAL HIGH (ref 70–99)
Potassium: 4.5 mmol/L (ref 3.5–5.2)
Sodium: 138 mmol/L (ref 134–144)
eGFR: 112 mL/min/{1.73_m2} (ref >59)

## 2023-11-07 ENCOUNTER — Telehealth: Payer: Self-pay | Admitting: *Deleted

## 2023-11-07 NOTE — Telephone Encounter (Signed)
 Pt reports that Cardiology was supposed to send Dr. Randeen Busman the results from her heart monitor.  She wants to know if we received them and what the results are.  Macario Savin, CMA

## 2023-11-08 NOTE — Telephone Encounter (Signed)
 Called patient to inform her that Dr. Randeen Busman haven't received any results from the Cardiologist from patient's heart monitor.  Patient stated that she received a message today 11/08/2023 that the cardiologist received the results and will review them with her.  Christ Courier, CMA

## 2023-11-13 DIAGNOSIS — R002 Palpitations: Secondary | ICD-10-CM

## 2023-11-15 ENCOUNTER — Ambulatory Visit: Payer: Self-pay | Admitting: Student

## 2023-11-16 ENCOUNTER — Other Ambulatory Visit: Payer: Self-pay

## 2023-11-16 DIAGNOSIS — R55 Syncope and collapse: Secondary | ICD-10-CM

## 2023-11-16 DIAGNOSIS — R002 Palpitations: Secondary | ICD-10-CM

## 2023-11-16 DIAGNOSIS — I10 Essential (primary) hypertension: Secondary | ICD-10-CM

## 2023-11-16 DIAGNOSIS — E782 Mixed hyperlipidemia: Secondary | ICD-10-CM

## 2023-11-16 DIAGNOSIS — R072 Precordial pain: Secondary | ICD-10-CM

## 2023-11-16 DIAGNOSIS — I6523 Occlusion and stenosis of bilateral carotid arteries: Secondary | ICD-10-CM

## 2023-11-16 DIAGNOSIS — R079 Chest pain, unspecified: Secondary | ICD-10-CM

## 2023-11-18 ENCOUNTER — Encounter (HOSPITAL_COMMUNITY): Payer: Self-pay

## 2023-11-22 ENCOUNTER — Ambulatory Visit: Payer: Self-pay

## 2023-11-22 ENCOUNTER — Ambulatory Visit (HOSPITAL_BASED_OUTPATIENT_CLINIC_OR_DEPARTMENT_OTHER)
Admission: RE | Admit: 2023-11-22 | Discharge: 2023-11-22 | Disposition: A | Discharge status: Home / Self Care | Payer: Self-pay | Source: Ambulatory Visit

## 2023-11-22 DIAGNOSIS — R072 Precordial pain: Secondary | ICD-10-CM | POA: Insufficient documentation

## 2023-11-22 MED ORDER — NITROGLYCERIN 0.4 MG SL SUBL
0.8000 mg | SUBLINGUAL_TABLET | Freq: Once | SUBLINGUAL | Status: AC
  Administered 2023-11-22: 0.8 mg via SUBLINGUAL

## 2023-11-22 MED ORDER — IOHEXOL 350 MG/ML SOLN
100.0000 mL | Freq: Once | INTRAVENOUS | Status: AC | PRN
  Administered 2023-11-22: 95 mL via INTRAVENOUS

## 2023-11-29 ENCOUNTER — Ambulatory Visit (HOSPITAL_BASED_OUTPATIENT_CLINIC_OR_DEPARTMENT_OTHER)
Admission: RE | Admit: 2023-11-29 | Discharge: 2023-11-29 | Disposition: A | Discharge status: Home / Self Care | Payer: Self-pay | Source: Ambulatory Visit

## 2023-11-29 ENCOUNTER — Ambulatory Visit: Payer: Self-pay

## 2023-11-29 DIAGNOSIS — R55 Syncope and collapse: Secondary | ICD-10-CM

## 2023-11-29 DIAGNOSIS — R079 Chest pain, unspecified: Secondary | ICD-10-CM | POA: Insufficient documentation

## 2023-11-29 DIAGNOSIS — I10 Essential (primary) hypertension: Secondary | ICD-10-CM | POA: Insufficient documentation

## 2023-11-29 DIAGNOSIS — R072 Precordial pain: Secondary | ICD-10-CM | POA: Insufficient documentation

## 2023-11-29 DIAGNOSIS — E782 Mixed hyperlipidemia: Secondary | ICD-10-CM | POA: Insufficient documentation

## 2023-11-29 DIAGNOSIS — R002 Palpitations: Secondary | ICD-10-CM | POA: Insufficient documentation

## 2023-11-29 DIAGNOSIS — I6523 Occlusion and stenosis of bilateral carotid arteries: Secondary | ICD-10-CM

## 2023-11-29 LAB — ECHOCARDIOGRAM COMPLETE
AR max vel: 2.14 cm2
AV Area VTI: 1.96 cm2
AV Area mean vel: 1.91 cm2
AV Mean grad: 5 mmHg
AV Peak grad: 8.5 mmHg
Ao pk vel: 1.46 m/s
Area-P 1/2: 2.56 cm2
Calc EF: 61.2 %
S' Lateral: 3.1 cm
Single Plane A2C EF: 62.4 %
Single Plane A4C EF: 61.2 %

## 2024-01-02 ENCOUNTER — Ambulatory Visit (INDEPENDENT_AMBULATORY_CARE_PROVIDER_SITE_OTHER): Payer: Self-pay

## 2024-01-02 VITALS — BP 139/80 | HR 73 | Ht 63.0 in | Wt 172.8 lb

## 2024-01-02 DIAGNOSIS — N6320 Unspecified lump in the left breast, unspecified quadrant: Secondary | ICD-10-CM

## 2024-01-02 DIAGNOSIS — E782 Mixed hyperlipidemia: Secondary | ICD-10-CM

## 2024-01-02 DIAGNOSIS — N649 Disorder of breast, unspecified: Secondary | ICD-10-CM

## 2024-01-02 DIAGNOSIS — N644 Mastodynia: Secondary | ICD-10-CM

## 2024-01-02 DIAGNOSIS — T783XXD Angioneurotic edema, subsequent encounter: Secondary | ICD-10-CM

## 2024-01-02 NOTE — Patient Instructions (Addendum)
 Thank you for visiting the clinic today, it was good to see you!  Please always bring your medication bottles  In today's visit we discussed:  Nipple/breast pain: I have put a referral for you to get a mammogram, they will likely also perform an ultrasound of your left breast given your symptoms.  If they do not find any concern for malignancy then we will see you back here as soon as you are available to discuss further evaluation of your symptoms  Cholesterol: Your last lipid panel had elevated triglycerides, we will repeat this.  The order is already placed so you can come in anytime tomorrow to get the lab work done.  Please follow-up in 1 month if needed  For any questions, please call the office at [phone] or send me a message in MyChart. Have a great day!  -Fairy Amy, MD  [facility] Resident, PGY-1

## 2024-01-02 NOTE — Progress Notes (Signed)
    SUBJECTIVE:   CHIEF COMPLAINT / HPI:   Left nipple pain: Started yesterday localized over nipple with visible dimpling.  Burning pain has been ongoing off-and-on issue for several months, but got more severe with new presentation yesterday.  Reports that she has never had anything like this, and reports that the pain is so severe that anything that touches it hurts severely.  She also reports some tenderness under the inferior lateral aspect of the breast and towards the sternal border.  No rash associated with it, but does report that it feels warm.  Denies fevers, nausea, vomiting, chills, changes to the right breast, nipple discharge, pus, other drainage, sudden changes to areola size or rash.  She reports that her period ended around August 4  Mixed hyperlipidemia: Last lipid panel found triglycerides around 360, with recommendation to repeat in August.  Cardiologist started her on several new antihypertensives, aspirin , and Lipitor.  Diffuse edema: She also reports that on Saturday she went to a concert and drink a lot of alcohol, afterwards she had burning/tingling/swelling of her cheeks, legs, and arms.  She denies any tightness in her throat or shortness of breath, no known exposures to different or unusual substances, and suspected it was because she drink alcohol with her new meds from the cardiologist.  She has a history of angioedema, but no history of allergies to perfumes or colognes.  PERTINENT  PMH / PSH: Maternal aunt with breast cancer, benign breast nodule biopsied, salpingectomy  OBJECTIVE:   BP 139/80   Pulse 73   Ht 5' 3 (1.6 m)   Wt 172 lb 12.8 oz (78.4 kg)   SpO2 100%   BMI 30.61 kg/m    Breast: See image, left nipple with large dimple, erythematous on the inside with small 2 to 3 mm nodule inside dimple, exquisitely tender to palpation.  No discharge or rash noted, lymph nodes along 2 to 6 o'clock position of breast moderately tender and reproducing burning  pain.  Right breast fibronodular but nontender. Cardiac: Regular rate and rhythm. Normal S1/S2. No murmurs, rubs, or gallops appreciated. Lungs: Clear bilaterally to ascultation.  Abdomen: Normoactive bowel sounds. No tenderness to deep or light palpation. No rebound or guarding.   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  Last mammogram done September 2024.  Though sudden presentation of symptoms and absence of discharge is reassuring that is more suspicious of an abscess.  Possibly related to a breast cyst, although the dimpling is unusual presentation, could also be related to a nipple fissure the patient has not breast-feeding and denies any change in bras or concerns for abrasion -Diagnostic mammogram ordered -Follow-up in 1 to 2 weeks if findings are benign Painful lumpy left breast See plan above Mixed hyperlipidemia Lipid panel ordered for tomorrow as the lab is closed. Angioedema, subsequent encounter Nonspecific presentation, likely associated with systemic allergic reaction.  No new or recent medications as she had been taking cardiology meds for over 2 months but did drink alcohol immediately prior to symptoms.  Fear this was likely exposure to some unknown known allergen or antigen at the concert given association with leg and hand and arm swelling. - Continue taking daily allergy med -Strict ED precautions given in the event of anaphylaxis     Fairy Amy, MD [facility]

## 2024-01-02 NOTE — Assessment & Plan Note (Signed)
 Lipid panel ordered for tomorrow as the lab is closed.

## 2024-01-02 NOTE — Assessment & Plan Note (Signed)
 Nonspecific presentation, likely associated with systemic allergic reaction.  No new or recent medications as she had been taking cardiology meds for over 2 months but did drink alcohol immediately prior to symptoms.  Fear this was likely exposure to some unknown known allergen or antigen at the concert given association with leg and hand and arm swelling. - Continue taking daily allergy med -Strict ED precautions given in the event of anaphylaxis

## 2024-01-03 ENCOUNTER — Ambulatory Visit
Admission: RE | Admit: 2024-01-03 | Discharge: 2024-01-03 | Disposition: A | Discharge status: Home / Self Care | Source: Ambulatory Visit | Attending: Obstetrics and Gynecology | Admitting: Obstetrics and Gynecology

## 2024-01-03 ENCOUNTER — Other Ambulatory Visit: Payer: Self-pay

## 2024-01-03 ENCOUNTER — Ambulatory Visit
Admission: RE | Admit: 2024-01-03 | Discharge: 2024-01-03 | Disposition: A | Discharge status: Home / Self Care | Source: Ambulatory Visit | Attending: Family Medicine | Admitting: Family Medicine

## 2024-01-03 ENCOUNTER — Ambulatory Visit: Payer: Self-pay | Admitting: *Deleted

## 2024-01-03 VITALS — BP 150/88 | Wt 171.8 lb

## 2024-01-03 DIAGNOSIS — N6320 Unspecified lump in the left breast, unspecified quadrant: Secondary | ICD-10-CM

## 2024-01-03 DIAGNOSIS — N63 Unspecified lump in unspecified breast: Secondary | ICD-10-CM

## 2024-01-03 DIAGNOSIS — N6453 Retraction of nipple: Secondary | ICD-10-CM

## 2024-01-03 DIAGNOSIS — N644 Mastodynia: Secondary | ICD-10-CM

## 2024-01-03 DIAGNOSIS — Z1239 Encounter for other screening for malignant neoplasm of breast: Secondary | ICD-10-CM

## 2024-01-03 NOTE — Progress Notes (Signed)
 [name] is a 43 y.o. female who presents to [facility] clinic today with complaint of left breast pain since March that increased this past Sunday that began burning. Patient stated the pain is constant rating it at a 10 out of 10. Patient complaints of right nipple changes since Sunday and the left breast swelling.    Pap Smear: Pap smear not completed today. Last Pap smear was 05/07/2022 at [facility] and was normal with negative HPV. Per patient has no history of an abnormal Pap smear. Last Pap smear result is available in Epic.   Physical exam: Breasts Left breast larger than right breast due to swelling. No skin abnormalities right breast. Left nipple red with a white hard pus like area within the center. No nipple retraction bilateral breasts. No nipple discharge right breast. Observed some drainage around the hard pus area left nipple. No lymphadenopathy. No lumps palpated bilateral breasts. Complaints of right outer and upper breast pain on exam. Complaints of left diffuse breast pain on exam that was greater within the nipple area.     MM CLIP PLACEMENT RIGHT Result Date: 08/18/2023 CLINICAL DATA:  Status post right upper outer quadrant breast mass biopsy EXAM: 3D DIAGNOSTIC RIGHT MAMMOGRAM POST ULTRASOUND BIOPSY COMPARISON:  Previous exam(s). ACR Breast Density Category c: The breasts are heterogeneously dense, which may obscure small masses. FINDINGS: 3D Mammographic images were obtained following ultrasound guided biopsy of right breast mass. The biopsy marking clip is in expected position at the site of biopsy. IMPRESSION: Appropriate positioning of the ribbon shaped biopsy marking clip at the site of biopsy in the upper-outer quadrant of the right breast. Final Assessment: Post Procedure Mammograms for Marker Placement Electronically Signed   By: Aliene Lloyd M.D.   On: 08/18/2023 12:25   MS 3D DIAG MAMMO BILAT BR (aka MM) Result Date: 02/17/2023 CLINICAL DATA:   Patient presents with multiple bilateral breast lumps. Patient also has left lateral breast pain. She was evaluated for a left breast lump and associated tenderness with diagnostic mammography and ultrasound on 11/25/2020 with negative imaging results. EXAM: DIGITAL DIAGNOSTIC BILATERAL MAMMOGRAM WITH TOMOSYNTHESIS AND CAD; ULTRASOUND LEFT BREAST LIMITED; ULTRASOUND RIGHT BREAST LIMITED TECHNIQUE: Bilateral digital diagnostic mammography and breast tomosynthesis was performed. The images were evaluated with computer-aided detection. ; Targeted ultrasound examination of the left breast was performed.; Targeted ultrasound examination of the right breast was performed COMPARISON:  Previous exam(s). ACR Breast Density Category c: The breasts are heterogeneously dense, which may obscure small masses. FINDINGS: There is a questionable partly obscured mass in the right breast, upper outer quadrant, anterior to the right breast palpable lump. No other evidence of a breast mass, no areas of significant asymmetry, no architectural distortion and no suspicious calcifications. On physical exam, there is a lumpy texture to the fibroglandular tissue of both breasts in the areas of the reported lumps. No defined mass. Targeted right breast ultrasound is performed, showing normal fibroglandular tissue in the area of the palpable lump. Anterior to this, at 11 o'clock, 3 cm the nipple, there is a hypoechoic oval mostly circumscribed parallel mass that measures 1.2 x 0.4 x 0.9 cm, corresponding to the possible mass noted on the mammogram. This is likely either a fibroadenoma or complicated cyst. There is no internal blood flow on color Doppler analysis. Targeted left breast ultrasound is performed, showing normal tissue in the areas of reported palpable lumps, at 2 o'clock, 5 cm the nipple, 6 o'clock 4 cm the  nipple and in the left axilla. No mass or suspicious finding. IMPRESSION: 1. Probably benign 1.2 cm mass in the right breast at  11 o'clock, 3 cm the nipple. Short-term follow-up recommended. 2. No evidence of left breast malignancy. No masses are seen on either side in the areas of the reported palpable lumps. RECOMMENDATION: 1. Right breast ultrasound in 6 months to reassess the 11 o'clock position probably benign mass. I have discussed the findings and recommendations with the patient. If applicable, a reminder letter will be sent to the patient regarding the next appointment. BI-RADS CATEGORY  3: Probably benign. Electronically Signed   By: Alm Parkins M.D.   On: 02/17/2023 14:14   MS DIGITAL DIAG TOMO BILAT Result Date: 11/25/2020 CLINICAL DATA:  43 year old female with a tender, palpable left breast lump. EXAM: DIGITAL DIAGNOSTIC BILATERAL MAMMOGRAM WITH TOMOSYNTHESIS AND CAD; ULTRASOUND LEFT BREAST LIMITED TECHNIQUE: Bilateral digital diagnostic mammography and breast tomosynthesis was performed. The images were evaluated with computer-aided detection.; Targeted ultrasound examination of the left breast was performed COMPARISON:  Previous exam(s). ACR Breast Density Category c: The breast tissue is heterogeneously dense, which may obscure small masses. FINDINGS: Radiopaque BB was placed at the site of the patient's lump in the upper-outer left breast. No focal or suspicious mammographic findings are seen deep to the radiopaque BB. No suspicious findings are identified in the remainder of either breast. On physical exam, I palpate no suspicious lumps in the upper-outer left breast. Targeted ultrasound is performed, showing dense, normal fibroglandular tissue without focal or suspicious sonographic abnormality. Evaluation of the entire upper outer left breast was performed. IMPRESSION: 1. No mammographic evidence of malignancy in either breast. 2. No suspicious sonographic findings at the site of the patient's focal left breast symptoms. RECOMMENDATION: 1. Clinical follow-up recommended for the palpable/painful area of concern in the  left breast. Any further workup should be based on clinical grounds. 2.  Screening mammogram in one year.(Code:SM-B-01Y) I have discussed the findings and recommendations with the patient. If applicable, a reminder letter will be sent to the patient regarding the next appointment. BI-RADS CATEGORY  1: Negative. Electronically Signed   By: Serena  Chacko M.D.   On: 11/25/2020 10:32    Pelvic/Bimanual Pap is not indicated today per BCCCP guidelines.   Smoking History: Patient has never smoked.   Patient Navigation: Patient education provided. Access to services provided for patient through BCCCP program.    Breast and Cervical Cancer Risk Assessment: Patient does not have family history of breast cancer, known genetic mutations, or radiation treatment to the chest before age 67. Patient does not have history of cervical dysplasia, immunocompromised, or DES exposure in-utero.  Risk Scores as of Encounter on 01/03/2024     Alisa           5-year 1.1%   Lifetime 11.62%   This patient is [place]/Lat[place] but has no documented birth country, so the [place] model used data from [place] patients to calculate their risk score. Document a birth country in the Demographics activity for a more accurate score.         Last calculated by Logan Lyle BRAVO, CMA on 01/03/2024 at  2:06 PM        A: BCCCP exam without pap smear Complaint of left breast pain, nipple changes, and swelling.  P: Referred patient to the [facility] for a diagnostic mammogram. Appointment scheduled Tuesday, January 03, 2024 at 1500.  Driscilla Wanda SQUIBB, RN 01/03/2024 2:13 PM

## 2024-01-03 NOTE — Addendum Note (Signed)
 Addended by: Teran Knittle E on: 01/03/2024 10:39 AM   Modules accepted: Orders

## 2024-01-03 NOTE — Patient Instructions (Signed)
 Explained breast self awareness with Janice JONETTA Breed. Patient did not need a Pap smear today due to last Pap smear and HPV Typing was 05/07/2022. Let her know BCCCP will cover Pap smears and HPV typing every 5 years unless has a history of abnormal Pap smears. Referred patient to the [facility] for a diagnostic mammogram. Appointment scheduled Tuesday, January 03, 2024 at 1500. Patient aware of appointment and will be there. [name] verbalized understanding.  Tyger Oka, Wanda Ship, RN 2:13 PM

## 2024-01-04 ENCOUNTER — Ambulatory Visit: Payer: Self-pay | Admitting: Obstetrics and Gynecology

## 2024-01-04 DIAGNOSIS — N63 Unspecified lump in unspecified breast: Secondary | ICD-10-CM

## 2024-01-04 DIAGNOSIS — D242 Benign neoplasm of left breast: Secondary | ICD-10-CM | POA: Insufficient documentation

## 2024-01-05 ENCOUNTER — Other Ambulatory Visit: Payer: Self-pay | Admitting: Family Medicine

## 2024-01-05 ENCOUNTER — Ambulatory Visit: Payer: Self-pay

## 2024-01-05 DIAGNOSIS — R7303 Prediabetes: Secondary | ICD-10-CM

## 2024-01-06 ENCOUNTER — Ambulatory Visit (INDEPENDENT_AMBULATORY_CARE_PROVIDER_SITE_OTHER): Payer: Self-pay | Admitting: Family Medicine

## 2024-01-06 VITALS — BP 112/70 | HR 68 | Ht 63.0 in | Wt 174.4 lb

## 2024-01-06 DIAGNOSIS — D242 Benign neoplasm of left breast: Secondary | ICD-10-CM

## 2024-01-06 DIAGNOSIS — M79622 Pain in left upper arm: Secondary | ICD-10-CM

## 2024-01-06 DIAGNOSIS — Z803 Family history of malignant neoplasm of breast: Secondary | ICD-10-CM

## 2024-01-06 NOTE — Telephone Encounter (Signed)
 Patient scheduled for 2:50p in [facility] this afternoon.

## 2024-01-06 NOTE — Patient Instructions (Addendum)
 I have placed a referral to general surgery as well as a geneticist  We will keep an eye out for your MRI results

## 2024-01-06 NOTE — Progress Notes (Signed)
    SUBJECTIVE:   CHIEF COMPLAINT / HPI:   Swelling under L armpit Was previously evaluated 8/11 for L nipple pain and breast tenderness Healed diagnostic mammogram done on 8/12 which showed 0.6 cm mass involving the nipple, likely a nipple adenoma.  Bilateral breast MRI was recommended for further evaluation.  This has been scheduled for 8/21  Was showering a couple of days ago and noticed some small swelling/masses in her armpit. Denies fevers. Her L breast does feel a bit more engorged/swollen compared to R. Has some clear nipple drainage a few days ago.    PERTINENT  PMH / PSH: Hx cervical cancer in mother. Hx breast cancer in grandparents  OBJECTIVE:   BP 112/70   Pulse 68   Ht 5' 3 (1.6 m)   Wt 174 lb 6.4 oz (79.1 kg)   LMP 12/24/2023 (Exact Date)   SpO2 100%   BMI 30.89 kg/m   General: NAD, pleasant, able to participate in exam Respiratory: No respiratory distress Skin: warm and dry, no rashes noted Psych: Normal affect and mood  L breast/axilla: Subtle, stringy lymphadenopathy appreciated along her left lateral superior (1 o clock) breast extending into L axilla. No obvious abscess, boil, fluid collection, wound, or skin discoloration noted. Very mildly tender/uncomfortable to palpation.  ASSESSMENT/PLAN:   Assessment & Plan Adenoma of left nipple Armpit pain, left Family history of breast cancer Some lymphadenopathy appreciated extending into axilla no evidence of abscess or boil that would be treatable/drainable today Discussed return precautions, plan to f/u MRI results Went ahead and placed referral to gen surgery as patient is very interested in surgical removal of her breasts regardless Given family hx and current workup I think this evaluation is appropriate. Also placed referral to genetics for genetic testing given her fhx   Payton Coward, MD [facility]

## 2024-01-06 NOTE — Assessment & Plan Note (Signed)
 Some lymphadenopathy appreciated extending into axilla no evidence of abscess or boil that would be treatable/drainable today Discussed return precautions, plan to f/u MRI results Went ahead and placed referral to gen surgery as patient is very interested in surgical removal of her breasts regardless Given family hx and current workup I think this evaluation is appropriate. Also placed referral to genetics for genetic testing given her fhx

## 2024-01-09 ENCOUNTER — Other Ambulatory Visit: Payer: Self-pay | Admitting: Family Medicine

## 2024-01-09 DIAGNOSIS — R7303 Prediabetes: Secondary | ICD-10-CM

## 2024-01-10 ENCOUNTER — Ambulatory Visit: Payer: Self-pay

## 2024-01-10 VITALS — BP 100/70 | HR 70 | Ht 63.0 in | Wt 172.0 lb

## 2024-01-10 DIAGNOSIS — I779 Disorder of arteries and arterioles, unspecified: Secondary | ICD-10-CM

## 2024-01-10 DIAGNOSIS — I10 Essential (primary) hypertension: Secondary | ICD-10-CM

## 2024-01-10 DIAGNOSIS — E782 Mixed hyperlipidemia: Secondary | ICD-10-CM

## 2024-01-10 NOTE — Assessment & Plan Note (Signed)
 Continues to be tolerant of her current dose of atorvastatin  continue the same 20 mg once daily. She has lipid panel pending to be done today. Recommended she can try fish oil however she does report shellfish allergy while she was cleaning her crab and got stuck on the finger.  She however admits to eat shrimp and other seafood without any problems. She is willing to try it and will try a dose of fish oil while being monitored and will avoid if she has a reaction.

## 2024-01-10 NOTE — Assessment & Plan Note (Signed)
 No significant obstructive disease on repeat carotid ultrasound July 2025. Continue lipid-lowering strategy.

## 2024-01-10 NOTE — Assessment & Plan Note (Signed)
 Well controlled. Continue current dose carvedilol  3.125 mg twice daily.  This is also helping with her sense of palpitations and elevated heart rates.

## 2024-01-10 NOTE — Patient Instructions (Addendum)
 Medication Instructions:  Your physician has recommended you make the following change in your medication:  Stop aspirin  Continue all other medications as prescribed  Labwork: none  Testing/Procedures: none  Follow-Up: Your physician recommends that you schedule a follow-up appointment in: as needed  Any Other Special Instructions Will Be Listed Below (If Applicable).  If you need a refill on your cardiac medications before your next appointment, please call your pharmacy.

## 2024-01-10 NOTE — Progress Notes (Signed)
 Cardiology Consultation:    Date:  01/10/2024   ID:  [name], DOB [birth year], MRN [id number]  PCP:  Janna Ferrier, DO  Cardiologist:  Alean SAUNDERS Achsah Mcquade, MD   Referring MD: Janna Ferrier, DO   No chief complaint on file.    ASSESSMENT AND PLAN:   [name] is a 43 year old woman  history of hypertriglyceridemia, prediabetes, palpitations, mild nonobstructive carotid artery disease on ultrasound of the carotids September 2018 no significant obstructive disease on repeat ultrasound carotids 11/29/2023, social alcohol consumption,, anxiety, pituitary adenoma, small cystic thyroid  nodule, echocardiogram 11/29/2023 with normal biventricular function and diastolic parameters, EF 55 to 39% without significant valve abnormalities and cardiac CT coronary angiogram results show no significant coronary atherosclerosis with calcium  score 0 and total plaque volume 1 mm cube. Zio patch 14 days study without any significant abnormality.  Overall her symptoms have improved. Left breast pain now noted with adenoma of the nipple pending further evaluation with surgeon and anticipating surgery.  Problem List Items Addressed This Visit     Hyperlipidemia - Primary   Continues to be tolerant of her current dose of atorvastatin  continue the same 20 mg once daily. She has lipid panel pending to be done today. Recommended she can try fish oil however she does report shellfish allergy while she was cleaning her crab and got stuck on the finger.  She however admits to eat shrimp and other seafood without any problems. She is willing to try it and will try a dose of fish oil while being monitored and will avoid if she has a reaction.       Carotid artery disease (HCC)   No significant obstructive disease on repeat carotid ultrasound July 2025. Continue lipid-lowering strategy.       Hypertension   Well controlled. Continue current dose carvedilol  3.125 mg twice daily.  This is also helping with her  sense of palpitations and elevated heart rates.      From cardiac standpoint okay to proceed with any elective surgery required for her breast as needed. Follow-up with us  in the office on an as-needed basis.   History of Present Illness:    [name] is a 43 y.o. female who is being seen today for follow-up visit. PCP is Janna Ferrier, DO. Last visit with me in the office was 10/25/2023.  Pleasant woman here for the visit by herself.  Continues to work at [employer].  Has a history of hypertriglyceridemia, prediabetes, palpitations, mild nonobstructive carotid artery disease on ultrasound of the carotids September 2018 no significant obstructive disease on repeat ultrasound carotids 11/29/2023, social alcohol consumption,, anxiety, pituitary adenoma, small cystic thyroid  nodule, echocardiogram 11/29/2023 with normal biventricular function and diastolic parameters, EF 55 to 39% without significant valve abnormalities and cardiac CT coronary angiogram results show no significant coronary atherosclerosis with calcium  score 0 and total plaque volume 1 mm cube. Zio patch 14 days study reported predominantly sinus rhythm with average heart rate 78/min [ranging from 53 to 131 bpm] rare ventricular and supraventricular ectopy burden less than 1%.  Multiple symptoms reported during the Zio patch monitor correlated with normal heart rate and rhythm and at times with isolated ventricular ectopic beats.  Over the past week she is dealing with symptoms of painful left breast swelling with history and felt to be an adenoma of the nipple and she is pending follow-up with surgeons to further review this and given her history of family members with breast cancer and cervical cancer she  is anticipating proceeding with surgery.  From cardiac standpoint she feels her blood pressures are improved.  Her heart rates have improved and palpitations have settled down since she has been taking carvedilol .  No other  significant symptoms since then.  Lipid panel from 10/12/2023 with triglycerides 335, HDL 41, LDL 100, total cholesterol 198.  Past Medical History:  Diagnosis Date   Abdominal pain 03/29/2021   Adenoma of left nipple 01/04/2024   0.6 cm hypoechoic mass seen on mammogram and targeted US  01/03/2024, recommended bilateral breast MRI w/o and w/ contrast for further evaluation indicated.     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   Cystic thyroid  nodule 07/03/2014   Diabetes mellitus without complication (HCC)    Dorsal wrist ganglion 05/20/2021   Eczema    Family history of anesthesia complication    Mother had problem waking up   Fatty liver disease, nonalcoholic 09/18/2009   Qualifier: Diagnosis of   By: Edrick MD, Devere         Flank pain 03/29/2021   Ganglion cyst of dorsum of right wrist    Ganglion cyst of volar aspect of right wrist 04/30/2022   Heart palpitations 10/11/2023   Hematochezia 03/08/2023   Hoarseness of voice 05/22/2020   Hx gestational diabetes    Hyperlipidemia 05/20/2021   Hypertension 10/25/2023   Multiple food allergies 10/24/2012   Obesity, unspecified 07/23/2012   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      09/2016 :   IMPRESSION:  1. No acute intracranial abnormality identified.  2. Diminished 2 mm focus of hypoenhancement within left posterior pituitary may represent scarring or residual microadenoma. No new pituitary mass identified.  3. Otherwise unremarkable MRI of the brain for age.        RUQ pain 02/02/2012   Seasonal allergies 10/24/2012   Spinal headache     Vaginal discharge 01/08/2023   Vision changes 03/01/2021    Past Surgical History:  Procedure Laterality Date   BREAST BIOPSY Right 08/18/2023   US  RT BREAST BX W LOC DEV 1ST LESION IMG BX SPEC US  GUIDE 08/18/2023 [facility] MAMMOGRAPHY   CESAREAN SECTION     CHOLECYSTECTOMY N/A 09/06/2013   Procedure: LAPAROSCOPIC CHOLECYSTECTOMY;  Surgeon: Vicenta DELENA Poli, MD;  Location: [facility] OR;  Service: General;  Laterality: N/A;   GANGLION CYST EXCISION Right 08/03/2021   Procedure: RIGHT REMOVAL GANGLION OF WRIST;  Surgeon: Romona Harari, MD;  Location: [facility];  Service: Orthopedics;  Laterality: Right;  regional with monitored anesthesia care   GANGLION CYST EXCISION Right 05/26/2022   Procedure: RIGHT WRIST VOLAR GANGLION CYST REMOVAL;  Surgeon: Jerri Kay HERO, MD;  Location: [facility];  Service: Orthopedics;  Laterality: Right;   NASAL SINUS SURGERY  2003   TUBAL LIGATION      Current Medications: Current Meds  Medication Sig   atorvastatin  (LIPITOR) 20 MG tablet Take 1 tablet (20 mg total) by mouth daily.   carvedilol  (COREG ) 3.125 MG tablet Take 1 tablet (3.125 mg total) by mouth 2 (two) times daily.   cetirizine  (ZYRTEC ) 10 MG tablet  Take 1 tablet (10 mg total) by mouth daily.   fluticasone  (FLONASE ) 50 MCG/ACT nasal spray Place 2 sprays into both nostrils daily.   metFORMIN  (GLUCOPHAGE -XR) 500 MG 24 hr tablet TAKE 2 TABLETS BY MOUTH ONCE DAILY WITH BREAKFAST   [DISCONTINUED] aspirin  EC 81 MG tablet Take 1 tablet (81 mg total) by mouth daily. Swallow whole.     Allergies:   Amoxicillin , Penicillins, and Shellfish allergy   Social History   Socioeconomic History   Marital status: Married    Spouse name: Not on file   Number of children: 3   Years of education: Not on file   Highest education level: 12th grade  Occupational History   Not on file  Tobacco Use   Smoking status: Never    Passive exposure: Never   Smokeless tobacco: Never  Vaping Use    Vaping status: Never Used  Substance and Sexual Activity   Alcohol use: Yes    Comment: occ.   Drug use: No   Sexual activity: Yes    Birth control/protection: Surgical  Other Topics Concern   Not on file  Social History Narrative   Not on file   Social Drivers of Health   Financial Resource Strain: Low Risk  (01/06/2024)   Overall Financial Resource Strain (CARDIA)    Difficulty of Paying Living Expenses: Not hard at all  Food Insecurity: No Food Insecurity (01/06/2024)   Hunger Vital Sign    Worried About Running Out of Food in the Last Year: Never true    Ran Out of Food in the Last Year: Never true  Transportation Needs: No Transportation Needs (01/06/2024)   PRAPARE - Administrator, Civil Service (Medical): No    Lack of Transportation (Non-Medical): No  Physical Activity: Inactive (01/06/2024)   Exercise Vital Sign    Days of Exercise per Week: 0 days    Minutes of Exercise per Session: Not on file  Stress: Stress Concern Present (01/06/2024)   [employer] of Occupational Health - Occupational Stress Questionnaire    Feeling of Stress: Rather much  Social Connections: Moderately Integrated (01/06/2024)   Social Connection and Isolation Panel    Frequency of Communication with Friends and Family: Twice a week    Frequency of Social Gatherings with Friends and Family: Once a week    Attends Religious Services: More than 4 times per year    Active Member of [employer] or Organizations: No    Attends Engineer, structural: Not on file    Marital Status: Married     Family History: The patient's family history includes Allergic rhinitis in her daughter, daughter, and son; Breast cancer in an other family member; Cervical cancer in her mother; Diabetes in her father and another family member; Hyperlipidemia in her mother; Hypertension in her father and mother; Irregular heart beat in her father; Prostate cancer in her maternal grandfather; Throat cancer in  her maternal grandmother. There is no history of Stomach cancer, Colon cancer, Esophageal cancer, or Pancreatic cancer. ROS:   Please see the history of present illness.    All 14 point review of systems negative except as described per history of present illness.  EKGs/Labs/Other Studies Reviewed:    The following studies were reviewed today:   EKG:       Recent Labs: 08/09/2023: ALT 18 10/12/2023: TSH 1.370 10/14/2023: Hemoglobin 12.3; Platelets 313 10/25/2023: BUN 12; Creatinine, Ser 0.64; Potassium 4.5; Sodium 138  Recent Lipid Panel  Component Value Date/Time   CHOL 198 10/12/2023 1101   TRIG 335 (H) 10/12/2023 1101   HDL 41 10/12/2023 1101   CHOLHDL 4.8 (H) 10/12/2023 1101   CHOLHDL 4.1 04/25/2015 1000   VLDL 25 04/25/2015 1000   LDLCALC 100 (H) 10/12/2023 1101   LDLDIRECT 103 (H) 11/09/2022 0953    Physical Exam:    VS:  BP 100/70   Pulse 70   Ht 5' 3 (1.6 m)   Wt 172 lb (78 kg)   LMP 12/24/2023 (Exact Date)   SpO2 99%   BMI 30.47 kg/m     Wt Readings from Last 3 Encounters:  01/10/24 172 lb (78 kg)  01/06/24 174 lb 6.4 oz (79.1 kg)  01/03/24 171 lb 12.8 oz (77.9 kg)     GENERAL:  Well nourished, well developed in no acute distress NECK: No JVD; No carotid bruits CARDIAC: RRR, S1 and S2 present, no murmurs, no rubs, no gallops Extremities: No pitting pedal edema. Pulses bilaterally symmetric with radial 2+ NEUROLOGIC:  Alert and oriented x 3  Medication Adjustments/Labs and Tests Ordered: Current medicines are reviewed at length with the patient today.  Concerns regarding medicines are outlined above.  No orders of the defined types were placed in this encounter.  No orders of the defined types were placed in this encounter.   Signed, Alean jess Kobus, MD, MPH, [facility]. 01/10/2024 8:39 AM    [facility]

## 2024-01-12 ENCOUNTER — Ambulatory Visit
Admission: RE | Admit: 2024-01-12 | Discharge: 2024-01-12 | Disposition: A | Discharge status: Home / Self Care | Source: Ambulatory Visit | Attending: Obstetrics and Gynecology | Admitting: Obstetrics and Gynecology

## 2024-01-12 DIAGNOSIS — N63 Unspecified lump in unspecified breast: Secondary | ICD-10-CM

## 2024-01-12 MED ORDER — GADOPICLENOL 0.5 MMOL/ML IV SOLN
8.0000 mL | Freq: Once | INTRAVENOUS | Status: AC | PRN
  Administered 2024-01-12: 8 mL via INTRAVENOUS

## 2024-01-31 ENCOUNTER — Other Ambulatory Visit

## 2024-01-31 DIAGNOSIS — E782 Mixed hyperlipidemia: Secondary | ICD-10-CM

## 2024-02-01 LAB — LIPID PANEL
Chol/HDL Ratio: 3.1 ratio (ref 0.0–4.4)
Cholesterol, Total: 133 mg/dL (ref 100–199)
HDL: 43 mg/dL (ref >39)
LDL Chol Calc (NIH): 66 mg/dL (ref 0–99)
Triglycerides: 138 mg/dL (ref 0–149)
VLDL Cholesterol Cal: 24 mg/dL (ref 5–40)

## 2024-02-06 ENCOUNTER — Other Ambulatory Visit: Payer: Self-pay | Admitting: Surgery

## 2024-02-10 ENCOUNTER — Encounter (HOSPITAL_BASED_OUTPATIENT_CLINIC_OR_DEPARTMENT_OTHER): Payer: Self-pay | Admitting: Surgery

## 2024-02-10 ENCOUNTER — Other Ambulatory Visit: Payer: Self-pay

## 2024-02-13 ENCOUNTER — Encounter (HOSPITAL_BASED_OUTPATIENT_CLINIC_OR_DEPARTMENT_OTHER)
Admission: RE | Admit: 2024-02-13 | Discharge: 2024-02-13 | Disposition: A | Discharge status: Home / Self Care | Source: Ambulatory Visit | Attending: Surgery | Admitting: Surgery

## 2024-02-13 DIAGNOSIS — Z01812 Encounter for preprocedural laboratory examination: Secondary | ICD-10-CM | POA: Insufficient documentation

## 2024-02-13 DIAGNOSIS — I10 Essential (primary) hypertension: Secondary | ICD-10-CM | POA: Insufficient documentation

## 2024-02-13 LAB — BASIC METABOLIC PANEL WITH GFR
Anion gap: 7 (ref 5–15)
BUN: 13 mg/dL (ref 6–20)
CO2: 23 mmol/L (ref 22–32)
Calcium: 9 mg/dL (ref 8.9–10.3)
Chloride: 108 mmol/L (ref 98–111)
Creatinine, Ser: 0.68 mg/dL (ref 0.44–1.00)
GFR, Estimated: >60 mL/min (ref >60)
Glucose, Bld: 135 mg/dL — ABNORMAL HIGH (ref 70–99)
Potassium: 4.8 mmol/L (ref 3.5–5.1)
Sodium: 138 mmol/L (ref 135–145)

## 2024-02-13 MED ORDER — CHLORHEXIDINE GLUCONATE CLOTH 2 % EX PADS: 6.0000 | MEDICATED_PAD | Freq: Once | CUTANEOUS | Status: DC

## 2024-02-13 MED ORDER — ENSURE PRE-SURGERY PO LIQD: 296.0000 mL | Freq: Once | ORAL | Status: DC

## 2024-02-13 NOTE — Progress Notes (Signed)

## 2024-02-16 NOTE — H&P (Signed)
 REFERRING PHYSICIAN: McDiarmid, Krystal BIRCH, MD PROVIDER: VICENTA DASIE POLI, MD MRN: [id number] DOB: August 20, 1980 DATE OF ENCOUNTER: 02/06/2024 Subjective   Chief Complaint: New Consultation (adenoma of left nipple. small lesion within nipple , approx 5 mm)  History of Present Illness: [name] is a 43 y.o. female who is seen today as an office consultation for evaluation of New Consultation (adenoma of left nipple. small lesion within nipple , approx 5 mm)  This is a 43 year old female who presents with a left nipple mass with bloody discharge. She has a significant family history of multiple malignancies including breast cancer. She had had previous imaging showing a small 9 mm mass in the right breast at the 11 position. 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 She underwent MRI of her breast after developing a small mass in her nipple on the left side. The bilateral MRI was unremarkable except for a 6 mm mass at the left nipple. There is no adenopathy. The mass cannot be biopsied by radiology secondary to the location. The patient reports that this has been draining intermittently since the diagnosis and she has had some pain in the breast as well as in the axilla. She is otherwise without complaints. She has had no previous problems regarding her breast.  Review of Systems: A complete review of systems was obtained from the patient. I have reviewed this information and discussed as appropriate with the patient. See HPI as well for other ROS.  ROS   Medical History: Past Medical History:  Diagnosis Date  Arthritis  left foot  Diabetes mellitus type 2, uncomplicated (CMS/HHS-HCC)   Patient Active Problem List  Diagnosis  Left foot pain  Tarsal coalitions, left  Osteoarthritis of left subtalar joint   Past Surgical History:  Procedure Laterality Date  FUSION FOOT SUBTALAR Left 06/04/2015  Procedure: (RCC) ARTHRODESIS; SUBTALAR; Surgeon: Oneil FORBES Bill, MD; Location: [facility] OR; Service:  Orthopedics; Laterality: Left;  CESAREAN SECTION  CHOLECYSTECTOMY OPEN  nose surgery    Allergies  Allergen Reactions  Acetaminophen  Swelling  Amoxicillin  Hives and Swelling  Penicillins Hives and Swelling  Shellfish Containing Products Hives   Current Outpatient Medications on File Prior to Visit  Medication Sig Dispense Refill  carvediloL  (COREG ) 3.125 MG tablet Take 3.125 mg by mouth 2 (two) times daily  metFORMIN  (GLUCOPHAGE ) 500 MG tablet Take by mouth.  sertraline  (ZOLOFT ) 50 MG tablet Take by mouth.  oxyCODONE  (OXYCONTIN ) 10 MG CR tablet 1-2 po bid for pain, use oxycodone  for breakthrough pain prn (Patient not taking: Reported on 07/12/2016 ) 20 tablet 0  oxyCODONE  (ROXICODONE ) 5 MG immediate release tablet Take 1-3 tablets (5-15 mg total) by mouth every 4 (four) hours as needed for Pain (Wean as able) for up to 75 doses. (Patient not taking: Reported on 07/12/2016 ) 75 tablet 0   No current facility-administered medications on file prior to visit.   Family History  Problem Relation Age of Onset  Alcohol abuse Father  Diabetes type II Father  Asthma Brother  Kidney disease Maternal Grandmother  Anesthesia problems Neg Hx  Malignant hypertension Neg Hx    Social History   Tobacco Use  Smoking Status Never  Smokeless Tobacco Never    Social History   Socioeconomic History  Marital status: Married  Tobacco Use  Smoking status: Never  Smokeless tobacco: Never  Vaping Use  Vaping status: Never Used  Substance and Sexual Activity  Alcohol use: Yes  Comment: Rare  Drug use: No  Sexual activity: Yes  Partners: Male  Birth control/protection: Surgical   Social Drivers of Health   Financial Resource Strain: Low Risk (01/06/2024)  Received from [facility]  Overall Financial Resource Strain (CARDIA)  How hard is it for you to pay for the very basics like food, housing, medical care, and heating?: Not hard at all  Food Insecurity: No Food Insecurity  (01/06/2024)  Received [facility]  Hunger Vital Sign  Within the past 12 months, you worried that your food would run out before you got the money to buy more.: Never true  Within the past 12 months, the food you bought just didn't last and you didn't have money to get more.: Never true  Transportation Needs: No Transportation Needs (01/06/2024)  Received from [facility] - Transportation  In the past 12 months, has lack of transportation kept you from medical appointments or from getting medications?: No  In the past 12 months, has lack of transportation kept you from meetings, work, or from getting things needed for daily living?: No  Physical Activity: Inactive (01/06/2024)  Received from [facility]  Exercise Vital Sign  On average, how many days per week do you engage in moderate to strenuous exercise (like a brisk walk)?: 0 days  Stress: Stress Concern Present (01/06/2024)  Received from [facility] - Occupational Stress Questionnaire  Do you feel stress - tense, restless, nervous, or anxious, or unable to sleep at night because your mind is troubled all the time - these days?: Rather much  Social Connections: Moderately Integrated (01/06/2024)  Received from [facility]  Social Connection and Isolation Panel  In a typical week, how many times do you talk on the phone with family, friends, or neighbors?: Twice a week  How often do you get together with friends or relatives?: Once a week  How often do you attend church or religious services?: More than 4 times per year  Do you belong to any clubs or organizations such as church groups, unions, fraternal or athletic groups, or school groups?: No  Are you married, widowed, divorced, separated, never married, or living with a partner?: Married   Objective:   Vitals:  02/06/24 0946  BP: 135/71  Pulse: 75  Temp: 36.8 C (98.2 F)  TempSrc: Temporal  SpO2: 98%  Weight: 78.7 kg  (173 lb 9.6 oz)  Height: 160 cm (5' 3)  PainSc: 4  PainLoc: Breast   Body mass index is 30.75 kg/m.  Physical Exam   She appears well on exam.  There is a mass at the lower edge of the left nipple that is retracting slightly. There is some slight adenopathy in the axilla and tenderness along the breast but no erythema. I cannot elicit discharge currently  A chaperone was present for the exam  Labs, Imaging and Diagnostic Testing: I reviewed her MRI and mammograms  Assessment and Plan:   Diagnoses and all orders for this visit:  Subareolar mass of left breast  Bloody discharge from left nipple  Other orders - doxycycline  (MONODOX ) 100 MG capsule; Take 1 capsule (100 mg total) by mouth 2 (two) times daily for 10 days - traMADoL  (ULTRAM ) 50 mg tablet; Take 1 tablet (50 mg total) by mouth every 6 (six) hours as needed for Pain for up to 5 days   We discussed the diagnosis of the mass in the nipple. Excisional biopsy of this mass is strongly recommended for complete his logic evaluation troller malignancy especially  in light of the bloody discharge and her strong family history. This is likely blocking off some duct and then caused a secondary infection in the breast which is now resolved with drainage but I will put her on antibiotics and pain medication preoperatively. I explained the surgical procedure in detail. We discussed the risk which includes but is not limited to bleeding, infection, injury to the nipple, nipple necrosis, the need for further surgery malignancy is present, cardiopulmonary issues with anesthesia, postoperative recovery, excetra. Even if the final pathology is benign, we will refer her to genetics at the cancer center for evaluation. She understands and agrees with the plans.

## 2024-02-17 ENCOUNTER — Ambulatory Visit (HOSPITAL_BASED_OUTPATIENT_CLINIC_OR_DEPARTMENT_OTHER): Admitting: Certified Registered Nurse Anesthetist

## 2024-02-17 ENCOUNTER — Encounter (HOSPITAL_BASED_OUTPATIENT_CLINIC_OR_DEPARTMENT_OTHER): Payer: Self-pay | Admitting: Surgery

## 2024-02-17 ENCOUNTER — Encounter (HOSPITAL_BASED_OUTPATIENT_CLINIC_OR_DEPARTMENT_OTHER)
Admission: RE | Disposition: A | Discharge status: Home / Self Care | Payer: Self-pay | Source: Home / Self Care | Attending: Surgery

## 2024-02-17 ENCOUNTER — Ambulatory Visit (HOSPITAL_BASED_OUTPATIENT_CLINIC_OR_DEPARTMENT_OTHER)
Admission: RE | Admit: 2024-02-17 | Discharge: 2024-02-17 | Disposition: A | Discharge status: Home / Self Care | Attending: Surgery | Admitting: Surgery

## 2024-02-17 ENCOUNTER — Other Ambulatory Visit: Payer: Self-pay

## 2024-02-17 DIAGNOSIS — N63 Unspecified lump in unspecified breast: Secondary | ICD-10-CM

## 2024-02-17 DIAGNOSIS — N6452 Nipple discharge: Secondary | ICD-10-CM | POA: Insufficient documentation

## 2024-02-17 DIAGNOSIS — N61 Mastitis without abscess: Secondary | ICD-10-CM | POA: Insufficient documentation

## 2024-02-17 DIAGNOSIS — N6342 Unspecified lump in left breast, subareolar: Secondary | ICD-10-CM | POA: Insufficient documentation

## 2024-02-17 DIAGNOSIS — R519 Headache, unspecified: Secondary | ICD-10-CM | POA: Insufficient documentation

## 2024-02-17 DIAGNOSIS — M19072 Primary osteoarthritis, left ankle and foot: Secondary | ICD-10-CM | POA: Insufficient documentation

## 2024-02-17 DIAGNOSIS — Z79899 Other long term (current) drug therapy: Secondary | ICD-10-CM | POA: Insufficient documentation

## 2024-02-17 DIAGNOSIS — I10 Essential (primary) hypertension: Secondary | ICD-10-CM | POA: Insufficient documentation

## 2024-02-17 DIAGNOSIS — E119 Type 2 diabetes mellitus without complications: Secondary | ICD-10-CM | POA: Insufficient documentation

## 2024-02-17 DIAGNOSIS — M1990 Unspecified osteoarthritis, unspecified site: Secondary | ICD-10-CM | POA: Insufficient documentation

## 2024-02-17 HISTORY — PX: EXCISION OF BREAST BIOPSY: SHX5822

## 2024-02-17 LAB — GLUCOSE, CAPILLARY
Glucose-Capillary: 111 mg/dL — ABNORMAL HIGH (ref 70–99)
Glucose-Capillary: 139 mg/dL — ABNORMAL HIGH (ref 70–99)

## 2024-02-17 LAB — POCT PREGNANCY, URINE: Preg Test, Ur: NEGATIVE

## 2024-02-17 SURGERY — EXCISION OF BREAST BIOPSY
Anesthesia: General | Site: Breast | Laterality: Left

## 2024-02-17 MED ORDER — PROPOFOL 10 MG/ML IV BOLUS
INTRAVENOUS | Status: DC | PRN
  Administered 2024-02-17: 200 mg via INTRAVENOUS

## 2024-02-17 MED ORDER — MIDAZOLAM HCL 5 MG/5ML IJ SOLN
INTRAMUSCULAR | Status: DC | PRN
  Administered 2024-02-17: 2 mg via INTRAVENOUS

## 2024-02-17 MED ORDER — ONDANSETRON HCL 4 MG/2ML IJ SOLN
INTRAMUSCULAR | Status: DC | PRN
  Administered 2024-02-17: 4 mg via INTRAVENOUS

## 2024-02-17 MED ORDER — ACETAMINOPHEN 500 MG PO TABS
ORAL_TABLET | ORAL | Status: AC
  Filled 2024-02-17: qty 2

## 2024-02-17 MED ORDER — LIDOCAINE HCL (CARDIAC) PF 100 MG/5ML IV SOSY
PREFILLED_SYRINGE | INTRAVENOUS | Status: DC | PRN
  Administered 2024-02-17: 50 mg via INTRAVENOUS

## 2024-02-17 MED ORDER — CIPROFLOXACIN IN D5W 400 MG/200ML IV SOLN
INTRAVENOUS | Status: AC
  Filled 2024-02-17: qty 200

## 2024-02-17 MED ORDER — DEXAMETHASONE SODIUM PHOSPHATE 10 MG/ML IJ SOLN
INTRAMUSCULAR | Status: AC
Start: 2024-02-17 — End: 2024-02-17
  Filled 2024-02-17: qty 1

## 2024-02-17 MED ORDER — TRAMADOL HCL 50 MG PO TABS: 50.0000 mg | ORAL_TABLET | Freq: Four times a day (QID) | ORAL | 0 refills | Status: DC | PRN

## 2024-02-17 MED ORDER — KETOROLAC TROMETHAMINE 30 MG/ML IJ SOLN
INTRAMUSCULAR | Status: DC | PRN
  Administered 2024-02-17: 30 mg via INTRAVENOUS

## 2024-02-17 MED ORDER — OXYCODONE HCL 5 MG PO TABS: 5.0000 mg | ORAL_TABLET | Freq: Once | ORAL | Status: DC | PRN

## 2024-02-17 MED ORDER — FENTANYL CITRATE (PF) 100 MCG/2ML IJ SOLN
INTRAMUSCULAR | Status: DC | PRN
  Administered 2024-02-17 (×2): 50 ug via INTRAVENOUS

## 2024-02-17 MED ORDER — FENTANYL CITRATE (PF) 100 MCG/2ML IJ SOLN: 25.0000 ug | INTRAMUSCULAR | Status: DC | PRN

## 2024-02-17 MED ORDER — OXYCODONE HCL 5 MG/5ML PO SOLN: 5.0000 mg | Freq: Once | ORAL | Status: DC | PRN

## 2024-02-17 MED ORDER — ACETAMINOPHEN 500 MG PO TABS
1000.0000 mg | ORAL_TABLET | ORAL | Status: AC
  Administered 2024-02-17: 1000 mg via ORAL

## 2024-02-17 MED ORDER — LIDOCAINE 2% (20 MG/ML) 5 ML SYRINGE
INTRAMUSCULAR | Status: AC
  Filled 2024-02-17: qty 5

## 2024-02-17 MED ORDER — PROPOFOL 10 MG/ML IV BOLUS
INTRAVENOUS | Status: AC
  Filled 2024-02-17: qty 20

## 2024-02-17 MED ORDER — FENTANYL CITRATE (PF) 100 MCG/2ML IJ SOLN
INTRAMUSCULAR | Status: AC
  Filled 2024-02-17: qty 2

## 2024-02-17 MED ORDER — DEXAMETHASONE SODIUM PHOSPHATE 4 MG/ML IJ SOLN
INTRAMUSCULAR | Status: DC | PRN
  Administered 2024-02-17: 5 mg via INTRAVENOUS

## 2024-02-17 MED ORDER — LACTATED RINGERS IV SOLN: INTRAVENOUS | Status: DC

## 2024-02-17 MED ORDER — CIPROFLOXACIN IN D5W 400 MG/200ML IV SOLN
400.0000 mg | INTRAVENOUS | Status: AC
Start: 2024-02-17 — End: 2024-02-17
  Administered 2024-02-17: 400 mg via INTRAVENOUS

## 2024-02-17 MED ORDER — ONDANSETRON HCL 4 MG/2ML IJ SOLN
INTRAMUSCULAR | Status: AC
Start: 2024-02-17 — End: 2024-02-17
  Filled 2024-02-17: qty 2

## 2024-02-17 MED ORDER — DROPERIDOL 2.5 MG/ML IJ SOLN: 0.6250 mg | Freq: Once | INTRAMUSCULAR | Status: DC | PRN

## 2024-02-17 MED ORDER — ACETAMINOPHEN 10 MG/ML IV SOLN: 1000.0000 mg | Freq: Once | INTRAVENOUS | Status: DC | PRN

## 2024-02-17 MED ORDER — MIDAZOLAM HCL 2 MG/2ML IJ SOLN
INTRAMUSCULAR | Status: AC
  Filled 2024-02-17: qty 2

## 2024-02-17 MED ORDER — BUPIVACAINE-EPINEPHRINE 0.5% -1:200000 IJ SOLN
INTRAMUSCULAR | Status: DC | PRN
  Administered 2024-02-17: 12 mL

## 2024-02-17 SURGICAL SUPPLY — 33 items
BLADE SURG 15 STRL LF DISP TIS (BLADE) ×1 IMPLANT
CANISTER SUCT 1200ML W/VALVE (MISCELLANEOUS) IMPLANT
CHLORAPREP W/TINT 26 (MISCELLANEOUS) ×1 IMPLANT
CLIP TI WIDE RED SMALL 6 (CLIP) IMPLANT
COVER BACK TABLE 60X90IN (DRAPES) ×1 IMPLANT
COVER MAYO STAND STRL (DRAPES) ×1 IMPLANT
DERMABOND ADVANCED .7 DNX12 (GAUZE/BANDAGES/DRESSINGS) ×1 IMPLANT
DRAPE LAPAROTOMY 100X72 PEDS (DRAPES) ×1 IMPLANT
DRAPE UTILITY XL STRL (DRAPES) ×1 IMPLANT
ELECTRODE REM PT RTRN 9FT ADLT (ELECTROSURGICAL) ×1 IMPLANT
GAUZE SPONGE 4X4 12PLY STRL LF (GAUZE/BANDAGES/DRESSINGS) ×1 IMPLANT
GLOVE BIOGEL PI IND STRL 7.5 (GLOVE) IMPLANT
GLOVE SURG SIGNA 7.5 PF LTX (GLOVE) ×1 IMPLANT
GLOVE SURG SYN 7.5 PF PI (GLOVE) IMPLANT
GOWN STRL REUS W/ TWL LRG LVL3 (GOWN DISPOSABLE) ×1 IMPLANT
GOWN STRL REUS W/ TWL XL LVL3 (GOWN DISPOSABLE) ×1 IMPLANT
KIT MARKER MARGIN INK (KITS) ×1 IMPLANT
NDL HYPO 25X1 1.5 SAFETY (NEEDLE) ×1 IMPLANT
NEEDLE HYPO 25X1 1.5 SAFETY (NEEDLE) ×1 IMPLANT
NS IRRIG 1000ML POUR BTL (IV SOLUTION) ×1 IMPLANT
PACK BASIN DAY SURGERY FS (CUSTOM PROCEDURE TRAY) ×1 IMPLANT
PENCIL SMOKE EVACUATOR (MISCELLANEOUS) ×1 IMPLANT
SLEEVE SCD COMPRESS KNEE MED (STOCKING) IMPLANT
SPIKE FLUID TRANSFER (MISCELLANEOUS) IMPLANT
SPONGE T-LAP 4X18 ~~LOC~~+RFID (SPONGE) ×1 IMPLANT
SUT MNCRL AB 4-0 PS2 18 (SUTURE) ×1 IMPLANT
SUT SILK 2 0 SH (SUTURE) ×1 IMPLANT
SUT VIC AB 3-0 SH 27X BRD (SUTURE) ×1 IMPLANT
SYR CONTROL 10ML LL (SYRINGE) ×1 IMPLANT
TOWEL GREEN STERILE FF (TOWEL DISPOSABLE) ×1 IMPLANT
TRAY FAXITRON CT DISP (TRAY / TRAY PROCEDURE) IMPLANT
TUBE CONNECTING 20X1/4 (TUBING) IMPLANT
YANKAUER SUCT BULB TIP NO VENT (SUCTIONS) IMPLANT

## 2024-02-17 NOTE — Transfer of Care (Signed)
 Immediate Anesthesia Transfer of Care Note  Patient: [name]  Procedure(s) Performed: EXCISIONAL BIOPSY OF LEFT NIPPLE MASS (Left: Breast)  Patient Location: PACU  Anesthesia Type:General  Level of Consciousness: awake, alert , and oriented  Airway & Oxygen Therapy: Patient Spontanous Breathing and Patient connected to face mask oxygen  Post-op Assessment: Report given to RN and Post -op Vital signs reviewed and stable  Post vital signs: Reviewed and stable  Last Vitals:  Vitals Value Taken Time  BP 125/78 02/17/24 11:00  Temp 36.4 C 02/17/24 10:30  Pulse 65 02/17/24 11:09  Resp 14 02/17/24 11:09  SpO2 96 % 02/17/24 11:09  Vitals shown include unfiled device data.  Last Pain:  Vitals:   02/17/24 1100  TempSrc:   PainSc: 0-No pain         Complications: No notable events documented.

## 2024-02-17 NOTE — Interval H&P Note (Signed)
 History and Physical Interval Note: no change in H and P  02/17/2024 9:22 AM  [name]  has presented today for surgery, with the diagnosis of LEFT NIPPLE MASS WITH BLOODY NIPPLE DISCHARGE.  The various methods of treatment have been discussed with the patient and family. After consideration of risks, benefits and other options for treatment, the patient has consented to  Procedure(s) with comments: EXCISION OF BREAST BIOPSY (Left) - LMA EXCISIONAL BIOPSY LEFT NIPPLE MASS as a surgical intervention.  The patient's history has been reviewed, patient examined, no change in status, stable for surgery.  I have reviewed the patient's chart and labs.  Questions were answered to the patient's satisfaction.     Vicenta Poli

## 2024-02-17 NOTE — Discharge Instructions (Addendum)
 You may shower starting tomorrow  Ice pack, tylenol , and ibuprofen  also for pain  No vigorous activity for 2 weeks  Call [phone] for any problems, questions, or needs for further notes for work   No Tylenol  before 3:30pm today. No NSAIDS (Ibuprofen /Motrin Jeronimo) before 6:30pm today.   Post Anesthesia Home Care Instructions  Activity: Get plenty of rest for the remainder of the day. A responsible individual must stay with you for 24 hours following the procedure.  For the next 24 hours, DO NOT: -Drive a car -Advertising copywriter -Drink alcoholic beverages -Take any medication unless instructed by your physician -Make any legal decisions or sign important papers.  Meals: Start with liquid foods such as gelatin or soup. Progress to regular foods as tolerated. Avoid greasy, spicy, heavy foods. If nausea and/or vomiting occur, drink only clear liquids until the nausea and/or vomiting subsides. Call your physician if vomiting continues.  Special Instructions/Symptoms: Your throat may feel dry or sore from the anesthesia or the breathing tube placed in your throat during surgery. If this causes discomfort, gargle with warm salt water . The discomfort should disappear within 24 hours.  If you had a scopolamine patch placed behind your ear for the management of post- operative nausea and/or vomiting:  1. The medication in the patch is effective for 72 hours, after which it should be removed.  Wrap patch in a tissue and discard in the trash. Wash hands thoroughly with soap and water . 2. You may remove the patch earlier than 72 hours if you experience unpleasant side effects which may include dry mouth, dizziness or visual disturbances. 3. Avoid touching the patch. Wash your hands with soap and water  after contact with the patch.                To Whom it May Concern:   [name] is recovering from surgery performed on 02/17/2024.  She will need to be out of work for 2 weeks in  order to recover from the procedure.  After 2 weeks, she may return to work to full activity without limits.  If there are any concerns or questions, please contact Janice. Lamarca who would then give permission to contact our office  Thank you,   Vicenta Poli, MD [facility], a division of [facility]

## 2024-02-17 NOTE — Op Note (Signed)
 EXCISIONAL BIOPSY OF LEFT NIPPLE MASS  Procedure Note  [name] 02/17/2024   Pre-op Diagnosis: LEFT NIPPLE MASS WITH BLOODY NIPPLE DISCHARGE     Post-op Diagnosis: same  Procedure(s): EXCISIONAL BIOPSY OF LEFT NIPPLE MASS (6 mm)  Surgeon(s): Vernetta Berg, MD  Anesthesia: General  Staff:  Circulator: Elaine Avelina PARAS, RN Scrub Person: Jannie Curtistine BROCKS  Estimated Blood Loss: Minimal               Specimens: sent to path  Indication: This is a 43 year old female who presented with left bloody nipple discharge.  She had an ultrasound showing a mass in the left nipple.  MRI firm the mass in the left nipple measuring approximately 6 mm.  She has a significant family history of breast cancer.  There were no other abnormalities identified in either breast on the MRI.  Surgical excision of the mass was recommended to rule out malignancy  Procedure: The patient was brought to the operating room identified as the correct patient.  She was placed upon on the operating room table and general anesthesia was induced.  Her left breast was prepped and draped in the usual sterile fashion.  At the 6 o'clock position of the breast where it was indented at the nipple I performed an elliptical incision with a scalpel.  I then took this down into the tissue of the nipple and excised what appeared to be the mass with a #15 blade.  The mass was sent to pathology for evaluation.  I could palpate no other masses in the nipple or in the breast.  I achieved hemostasis with the cautery.  I anesthetized the surrounding tissue further with Marcaine .  I then closed the subcutaneous tissue with a 3-0 Vicryl suture and then interrupted 4-0 Monocryl sutures.  Dermabond was then applied.  The patient tolerated the procedure well.  All the counts were correct at the end of the procedure.  The patient was then extubated in the operating room and taken in a stable condition to the recovery room.          Berg Vernetta   Date: 02/17/2024  Time: 10:24 AM

## 2024-02-17 NOTE — Anesthesia Procedure Notes (Signed)
 Procedure Name: LMA Insertion Date/Time: 02/17/2024 9:59 AM  Performed by: Buster Catheryn SAUNDERS, CRNAPre-anesthesia Checklist: Patient identified, Emergency Drugs available, Suction available and Patient being monitored Patient Re-evaluated:Patient Re-evaluated prior to induction Oxygen Delivery Method: Circle system utilized Preoxygenation: Pre-oxygenation with 100% oxygen Induction Type: IV induction Ventilation: Mask ventilation without difficulty LMA: LMA inserted LMA Size: 4.0 Number of attempts: 1 Placement Confirmation: positive ETCO2 Tube secured with: Tape Dental Injury: Teeth and Oropharynx as per pre-operative assessment

## 2024-02-17 NOTE — Anesthesia Preprocedure Evaluation (Signed)
 Anesthesia Evaluation  Patient identified by MRN, date of birth, ID band Patient awake  General Assessment Comment:Denies family history of MH  Reviewed: Allergy & Precautions, NPO status , Patient's Chart, lab work & pertinent test results  History of Anesthesia Complications (+) POST - OP SPINAL HEADACHE, Family history of anesthesia reaction and history of anesthetic complications  Airway Mallampati: II  TM Distance: >3 FB Neck ROM: Full    Dental  (+) Teeth Intact, Dental Advisory Given   Pulmonary neg pulmonary ROS   breath sounds clear to auscultation       Cardiovascular hypertension,  Rhythm:Regular Rate:Normal     Neuro/Psych  Headaches    GI/Hepatic negative GI ROS, Neg liver ROS,,,  Endo/Other  diabetes    Renal/GU negative Renal ROS     Musculoskeletal  (+) Arthritis ,    Abdominal   Peds  Hematology negative hematology ROS (+)   Anesthesia Other Findings   Reproductive/Obstetrics                              Anesthesia Physical Anesthesia Plan  ASA: 2  Anesthesia Plan: General   Post-op Pain Management: Tylenol  PO (pre-op)* and Toradol  IV (intra-op)*   Induction: Intravenous  PONV Risk Score and Plan: 4 or greater and Ondansetron , Dexamethasone , Midazolam  and Scopolamine patch - Pre-op  Airway Management Planned: LMA  Additional Equipment: None  Intra-op Plan:   Post-operative Plan: Extubation in OR  Informed Consent: I have reviewed the patients History and Physical, chart, labs and discussed the procedure including the risks, benefits and alternatives for the proposed anesthesia with the patient or authorized representative who has indicated his/her understanding and acceptance.     Dental advisory given  Plan Discussed with: CRNA  Anesthesia Plan Comments:         Anesthesia Quick Evaluation

## 2024-02-17 NOTE — Anesthesia Postprocedure Evaluation (Signed)
 Anesthesia Post Note  Patient: [name]  Procedure(s) Performed: EXCISIONAL BIOPSY OF LEFT NIPPLE MASS (Left: Breast)     Patient location during evaluation: PACU Anesthesia Type: General Level of consciousness: awake and alert Pain management: pain level controlled Vital Signs Assessment: post-procedure vital signs reviewed and stable Respiratory status: spontaneous breathing, nonlabored ventilation, respiratory function stable and patient connected to nasal cannula oxygen Cardiovascular status: blood pressure returned to baseline and stable Postop Assessment: no apparent nausea or vomiting Anesthetic complications: no   No notable events documented.  Last Vitals:  Vitals:   02/17/24 1100 02/17/24 1119  BP: 125/78 125/77  Pulse: 68 66  Resp: 18 18  Temp:  (!) 36.3 C  SpO2: 100% 100%    Last Pain:  Vitals:   02/17/24 1119  TempSrc:   PainSc: 0-No pain                 [name]

## 2024-02-18 ENCOUNTER — Encounter (HOSPITAL_BASED_OUTPATIENT_CLINIC_OR_DEPARTMENT_OTHER): Payer: Self-pay | Admitting: Surgery

## 2024-02-20 LAB — SURGICAL PATHOLOGY

## 2024-02-23 ENCOUNTER — Ambulatory Visit: Payer: Self-pay | Admitting: *Deleted

## 2024-02-23 VITALS — BP 131/78 | Wt 170.0 lb

## 2024-02-23 DIAGNOSIS — Z01419 Encounter for gynecological examination (general) (routine) without abnormal findings: Secondary | ICD-10-CM

## 2024-02-23 DIAGNOSIS — N644 Mastodynia: Secondary | ICD-10-CM

## 2024-02-23 NOTE — Progress Notes (Signed)
 [name] is a 43 y.o. female who presents to [facility] clinic today with complaint of bilateral breast pain since March 2025 that diagnostic mammogram was completed for follow up and left breast surgery.    Pap Smear: Pap smear not completed today. Last Pap smear was 05/07/2022 at [facility] clinic and was normal with negative HPV. Per patient has no history of an abnormal Pap smear. Last Pap smear result is available in Epic.    Physical exam: Breasts Breasts symmetrical. No skin abnormalities right breast. Left nipple healing from breast surgery on 02/17/2024. No nipple discharge bilateral breasts on exam. No lymphadenopathy. No lumps palpated bilateral breasts. Complaints of bilateral outer breast tenderness on exam.     MM 3D DIAGNOSTIC MAMMOGRAM UNILATERAL LEFT BREAST Addendum Date: 01/10/2024 ADDENDUM REPORT: 01/10/2024 07:32 ADDENDUM REPORT: ADDENDUM REPORT Recommendation from 01/03/2024 imaging: Bilateral breast MRI without and with contrast has been scheduled for 01/12/2024. Patient is aware. Report prepared by: Rock Hover RN on 01/06/2024 Electronically Signed   By: Debby Satterfield M.D.   On: 01/10/2024 07:32   Addendum Date: 01/04/2024 ADDENDUM REPORT: 01/04/2024 10:34 ADDENDUM: The recommendation section should read: BILATERAL Breast MRI without and with contrast. Electronically Signed   By: Debby Satterfield M.D.   On: 01/04/2024 10:34   Result Date: 01/10/2024 CLINICAL DATA:  43 year old presenting with LEFT nipple pain and crusting involving the nipple. EXAM: DIGITAL DIAGNOSTIC UNILATERAL LEFT MAMMOGRAM WITH TOMOSYNTHESIS AND CAD; ULTRASOUND LEFT BREAST LIMITED TECHNIQUE: Left digital diagnostic mammography and breast tomosynthesis was performed. The images were evaluated with computer-aided detection. ; Targeted ultrasound examination of the left breast was performed. COMPARISON:  Previous exam(s). ACR Breast Density Category c: The breasts are heterogeneously dense,  which may obscure small masses. FINDINGS: Full field CC and MLO views and a spot compression MLO view of the retroareolar location or performed. No findings suspicious for malignancy. Targeted ultrasound was performed in the retroareolar location, demonstrating a hypoechoic mass within the nipple (hyperechoic relative to the nipple itself) measuring approximately 0.6 x 0.2 x 0.4 cm, demonstrating internal power Doppler flow. There is no evidence of duct ectasia. No intraductal masses are identified. On correlative physical examination, there is a blood-tinged LEFT nipple discharge. 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 RECOMMENDATION: BILATERAL Breast MRI without with contrast is recommended in further evaluation to exclude other findings in addition to the likely nipple adenoma that might explain the bloody nipple discharge. I have discussed the findings and recommendations with the patient. If applicable, a reminder letter will be sent to the patient regarding the next appointment. BI-RADS CATEGORY  3: Probably benign. Electronically Signed: By: Debby Satterfield M.D. On: 01/03/2024 16:22   MM CLIP PLACEMENT RIGHT Result Date: 08/18/2023 CLINICAL DATA:  Status post right upper outer quadrant breast mass biopsy EXAM: 3D DIAGNOSTIC RIGHT MAMMOGRAM POST ULTRASOUND BIOPSY COMPARISON:  Previous exam(s). ACR Breast Density Category c: The breasts are heterogeneously dense, which may obscure small masses. FINDINGS: 3D Mammographic images were obtained following ultrasound guided biopsy of right breast mass. The biopsy marking clip is in expected position at the site of biopsy. IMPRESSION: Appropriate positioning of the ribbon shaped biopsy marking clip at the site of biopsy in the upper-outer quadrant of the right breast. Final Assessment: Post Procedure Mammograms for Marker Placement Electronically Signed   By: Aliene Lloyd M.D.   On: 08/18/2023 12:25   MS 3D DIAG MAMMO BILAT BR  (aka MM) Result Date: 02/17/2023  CLINICAL DATA:  Patient presents with multiple bilateral breast lumps. Patient also has left lateral breast pain. She was evaluated for a left breast lump and associated tenderness with diagnostic mammography and ultrasound on 11/25/2020 with negative imaging results. EXAM: DIGITAL DIAGNOSTIC BILATERAL MAMMOGRAM WITH TOMOSYNTHESIS AND CAD; ULTRASOUND LEFT BREAST LIMITED; ULTRASOUND RIGHT BREAST LIMITED TECHNIQUE: Bilateral digital diagnostic mammography and breast tomosynthesis was performed. The images were evaluated with computer-aided detection. ; Targeted ultrasound examination of the left breast was performed.; Targeted ultrasound examination of the right breast was performed COMPARISON:  Previous exam(s). ACR Breast Density Category c: The breasts are heterogeneously dense, which may obscure small masses. FINDINGS: There is a questionable partly obscured mass in the right breast, upper outer quadrant, anterior to the right breast palpable lump. No other evidence of a breast mass, no areas of significant asymmetry, no architectural distortion and no suspicious calcifications. On physical exam, there is a lumpy texture to the fibroglandular tissue of both breasts in the areas of the reported lumps. No defined mass. Targeted right breast ultrasound is performed, showing normal fibroglandular tissue in the area of the palpable lump. Anterior to this, at 11 o'clock, 3 cm the nipple, there is a hypoechoic oval mostly circumscribed parallel mass that measures 1.2 x 0.4 x 0.9 cm, corresponding to the possible mass noted on the mammogram. This is likely either a fibroadenoma or complicated cyst. There is no internal blood flow on color Doppler analysis. Targeted left breast ultrasound is performed, showing normal tissue in the areas of reported palpable lumps, at 2 o'clock, 5 cm the nipple, 6 o'clock 4 cm the nipple and in the left axilla. No mass or suspicious finding. IMPRESSION: 1.  Probably benign 1.2 cm mass in the right breast at 11 o'clock, 3 cm the nipple. Short-term follow-up recommended. 2. No evidence of left breast malignancy. No masses are seen on either side in the areas of the reported palpable lumps. RECOMMENDATION: 1. Right breast ultrasound in 6 months to reassess the 11 o'clock position probably benign mass. I have discussed the findings and recommendations with the patient. If applicable, a reminder letter will be sent to the patient regarding the next appointment. BI-RADS CATEGORY  3: Probably benign. Electronically Signed   By: Alm Parkins M.D.   On: 02/17/2023 14:14   MS DIGITAL DIAG TOMO BILAT Result Date: 11/25/2020 CLINICAL DATA:  43 year old female with a tender, palpable left breast lump. EXAM: DIGITAL DIAGNOSTIC BILATERAL MAMMOGRAM WITH TOMOSYNTHESIS AND CAD; ULTRASOUND LEFT BREAST LIMITED TECHNIQUE: Bilateral digital diagnostic mammography and breast tomosynthesis was performed. The images were evaluated with computer-aided detection.; Targeted ultrasound examination of the left breast was performed COMPARISON:  Previous exam(s). ACR Breast Density Category c: The breast tissue is heterogeneously dense, which may obscure small masses. FINDINGS: Radiopaque BB was placed at the site of the patient's lump in the upper-outer left breast. No focal or suspicious mammographic findings are seen deep to the radiopaque BB. No suspicious findings are identified in the remainder of either breast. On physical exam, I palpate no suspicious lumps in the upper-outer left breast. Targeted ultrasound is performed, showing dense, normal fibroglandular tissue without focal or suspicious sonographic abnormality. Evaluation of the entire upper outer left breast was performed. IMPRESSION: 1. No mammographic evidence of malignancy in either breast. 2. No suspicious sonographic findings at the site of the patient's focal left breast symptoms. RECOMMENDATION: 1. Clinical follow-up  recommended for the palpable/painful area of concern in the left breast. Any further workup should be  based on clinical grounds. 2.  Screening mammogram in one year.(Code:SM-B-01Y) I have discussed the findings and recommendations with the patient. If applicable, a reminder letter will be sent to the patient regarding the next appointment. BI-RADS CATEGORY  1: Negative. Electronically Signed   By: Serena  Chacko M.D.   On: 11/25/2020 10:32   Pelvic/Bimanual Pap is not indicated today per BCCCP guidelines.   Smoking History: Patient has never smoked.   Patient Navigation: Patient education provided. Access to services provided for patient through B[facility]CP program.  patient    Breast and Cervical Cancer Risk Assessment: Patient has family history of maternal aunt, 2 paternal great aunts, and 1 maternal great aunts having breast cancer. Patient has no known genetic mutations or history of radiation treatment to the chest before age 28. Patient does not have history of cervical dysplasia, immunocompromised, or DES exposure in-utero.  Risk Scores as of Encounter on 02/23/2024     [place] as of 01/03/2024           5-year 1.1%   Lifetime 11.62%   This patient is [place] but has no documented birth country, so the [place] model used data from [facility] patients to calculate their risk score. Document a birth country in the Demographics activity for a more accurate score.         Last calculated by Logan Lyle BRAVO, CMA on 01/03/2024 at  2:06 PM        A: B[facility]CP exam without pap smear Complaint of bilateral breast pain.   Driscilla Wanda SQUIBB, RN 02/23/2024 2:37 PM

## 2024-02-23 NOTE — Patient Instructions (Addendum)
 Explained breast self awareness with [name]. Patient did not need a Pap smear today due to last Pap smear and HPV typing was 05/07/2022. Let her know [facility] will cover Pap smears and HPV typing every 5 years unless has a history of abnormal Pap smears. Explained what follow up is covered by B[facility]CP and that [facility]CP will only cover the surgeon fee for her breast surgery. [name] verbalized understanding.  Elda Dunkerson, Wanda Ship, RN 2:37 PM

## 2024-03-08 NOTE — Progress Notes (Addendum)
 REFERRING PROVIDER: Janna Ferrier, DO [address] [place],  [place] [address]  PRIMARY PROVIDER:  Janna Ferrier, DO  PRIMARY REASON FOR VISIT:  No diagnosis found.  HISTORY OF PRESENT ILLNESS:   [name], a 43 y.o. female, was seen for a [facility] cancer genetics consultation at the request of Ferrier Janna, DO due to a family history of breast cancer and current workup for a breast mass. [name] presents to clinic today to discuss the possibility of a hereditary predisposition to cancer, genetic testing, and to further clarify her future cancer risks, as well as potential cancer risks for family members.   CANCER HISTORY:  [name] is a 43 y.o. female with no personal history of cancer. Yet, she has undergoing 2 breast biopsies and is currently being worked up for a breast mass. Additionally she has a history of a pituitary microadenoma 6mm from 2003.   RISK FACTORS:  Menarche was at age 83.  First live birth at age 70.  OCP use for approximately ~20 years.  Ovaries intact: yes.  Hysterectomy: no.  Menopausal status: premenopausal.  HRT use: 0 years. Colonoscopy: no; normal. Mammogram within the last year: yes. Number of breast biopsies: 2. Up to date with pelvic exams: yes. Any excessive radiation exposure in the past: no Tobacco Use: Never Past Medical History:  Diagnosis Date   Abdominal pain 03/29/2021   Adenoma of left nipple 01/04/2024   0.6 cm hypoechoic mass seen on mammogram and targeted US  01/03/2024, recommended bilateral breast MRI w/o and w/ contrast for further evaluation indicated.     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   Cystic thyroid  nodule 07/03/2014   Diabetes  mellitus without complication (HCC)    Dorsal wrist ganglion 05/20/2021   Eczema    Family history of anesthesia complication    Mother had problem waking up   Fatty liver disease, nonalcoholic 09/18/2009   Qualifier: Diagnosis of   By: Edrick MD, Devere         Flank pain 03/29/2021   Ganglion cyst of dorsum of right wrist    Ganglion cyst of volar aspect of right wrist 04/30/2022   Heart palpitations 10/11/2023   Hematochezia 03/08/2023   Hoarseness of voice 05/22/2020   Hx gestational diabetes    Hyperlipidemia 05/20/2021   Hypertension 10/25/2023   Multiple food allergies 10/24/2012   Obesity, unspecified 07/23/2012   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      09/2016 :   IMPRESSION:  1. No acute intracranial abnormality identified.  2. Diminished 2 mm focus of hypoenhancement within left posterior pituitary may represent scarring or residual microadenoma. No new pituitary mass identified.  3. Otherwise unremarkable MRI of the brain for age.        RUQ pain 02/02/2012   Seasonal allergies 10/24/2012   Spinal headache    Vaginal discharge 01/08/2023   Vision changes 03/01/2021   Past Surgical History:  Procedure Laterality Date   BREAST BIOPSY Right 08/18/2023   US  RT BREAST BX W LOC DEV 1ST LESION IMG BX SPEC US  GUIDE 08/18/2023 [facility] MAMMO[facility]APHY  CESAREAN SECTION     CHOLECYSTECTOMY N/A 09/06/2013   Procedure: LAPAROSCOPIC CHOLECYSTECTOMY;  Surgeon: Vicenta DELENA Poli, MD;  Location: [facility];  Service: General;  Laterality: N/A;   EXCISION OF BREAST BIOPSY Left 02/17/2024   Procedure: EXCISIONAL BIOPSY OF LEFT NIPPLE MASS;  Surgeon: Poli Vicenta, MD;  Location: [facility];  Service: General;  Laterality: Left;  LMA EXCISIONAL BIOPSY LEFT NIPPLE MASS   GANGLION CYST EXCISION Right 08/03/2021   Procedure: RIGHT REMOVAL GANGLION OF WRIST;  Surgeon: Romona Harari, MD;   Location: [facility];  Service: Orthopedics;  Laterality: Right;  regional with monitored anesthesia care   GANGLION CYST EXCISION Right 05/26/2022   Procedure: RIGHT WRIST VOLAR GANGLION CYST REMOVAL;  Surgeon: Jerri Kay HERO, MD;  Location: [facility];  Service: Orthopedics;  Laterality: Right;   NASAL SINUS SURGERY  2003   TUBAL LIGATION     Social History   Socioeconomic History   Marital status: Married    Spouse name: Not on file   Number of children: 3   Years of education: Not on file   Highest education level: 12th grade  Occupational History   Not on file  Tobacco Use   Smoking status: Never    Passive exposure: Never   Smokeless tobacco: Never  Vaping Use   Vaping status: Never Used  Substance and Sexual Activity   Alcohol use: Yes    Comment: occ.   Drug use: No   Sexual activity: Yes    Birth control/protection: Surgical    Comment: BTL  Other Topics Concern   Not on file  Social History Narrative   Not on file   Social Drivers of Health   Financial Resource Strain: Low Risk  (01/06/2024)   Overall Financial Resource Strain (CARDIA)    Difficulty of Paying Living Expenses: Not hard at all  Food Insecurity: Food Insecurity Present (02/23/2024)   Hunger Vital Sign    Worried About Running Out of Food in the Last Year: Sometimes true    Ran Out of Food in the Last Year: Sometimes true  Transportation Needs: No Transportation Needs (02/23/2024)   PRAPARE - Administrator, Civil Service (Medical): No    Lack of Transportation (Non-Medical): No  Physical Activity: Inactive (01/06/2024)   Exercise Vital Sign    Days of Exercise per Week: 0 days    Minutes of Exercise per Session: Not on file  Stress: Stress Concern Present (01/06/2024)   [employer] of Occupational Health - Occupational Stress Questionnaire    Feeling of Stress: Rather much  Social Connections: Moderately Integrated (01/06/2024)   Social Connection and  Isolation Panel    Frequency of Communication with Friends and Family: Twice a week    Frequency of Social Gatherings with Friends and Family: Once a week    Attends Religious Services: More than 4 times per year    Active Member of [employer] or Organizations: No    Attends Engineer, structural: Not on file    Marital Status: Married    FAMILY HISTORY:  We obtained a detailed, 4-generation family history.  Significant diagnoses are listed below: Family History  Problem Relation Age of Onset   Cervical cancer Mother 67   Anemia Mother        Due to cancer   Breast cancer Mother 43   Brain Mass  Brother    Pituitary Mass Sister    Breast cancer Maternal Aunt 85  Breast cancer Paternal Aunt 93 - 69   Throat cancer Maternal Grandmother    Prostate cancer Maternal Grandfather 98   Brain cancer Maternal Great-grandmother    Breast cancer Sister 1 of Maternal Grandfather 76   Breast cancer Sister 2 of Maternal Grandfather 54   Cancer Sister 3 of Maternal Grandfather        Face Cancer 61s   Stomach cancer Maternal Uncle    Pedigree Summary:  Of Note: [name] shared that several of her relatives have larges tumors/masses that they refuse to get care for. She believes this is due financial concerns for some and fear of a diagnosis and necessary subsequent treatment for others. This includes her brother with a brain mass above his eye that has impaired his vision. Her sister large mass in her armpit that has developed since her breast cancer treatment.  [name] is unaware of relatives completing genetic testing for hereditary cancer risks.  There is no reported Ashkenazi Jewish ancestry.  There is no known consanguinity.    GENETIC COUNSELING ASSESSMENT: [name] is a 43 y.o. female with a family history which includes breast, prostate and stomach cancer which is somewhat suggestive of a hereditary cancer predisposition syndrome. We, therefore, discussed and recommended the  following at today's visit.   DISCUSSION: We discussed that, in general, most cancer is not inherited in families, but instead is sporadic or familial. Sporadic cancers occur by chance and typically happen at older ages (>50 years) as this type of cancer is caused by genetic changes acquired during an individual's lifetime. Some families have more cancers than would be expected by chance; however, the ages or types of cancer are not consistent with a known genetic mutation or known genetic mutations have been ruled out. This type of familial cancer is thought to be due to a combination of multiple genetic, environmental, hormonal, and lifestyle factors. While this combination of factors likely increases the risk of cancer, the exact source of this risk is not currently identifiable or testable.  We discussed that 5-10% of cancer is the result of germline (heritable) genetic variants, with most cases associated with BRCA1/BRCA2. There are other genes that can be associated with hereditary cancer syndromes. We discussed that testing is beneficial for several reasons including knowing how to follow individuals after completing their treatment, identifying whether potential treatment options such as PARP inhibitors would be beneficial, and understanding if other family members could be at risk for cancer and allow them to undergo genetic testing.   We reviewed the characteristics, features and inheritance patterns of hereditary cancer syndromes. We also discussed genetic testing, including the appropriate family members to test, the process of testing, insurance coverage and turn-around-time for results. We discussed the implications of a negative, positive, carrier and/or variant of uncertain significant result. [name]  was offered a common hereditary cancer panel (40 genes) and an expanded pan-cancer panel (77 genes). [name] was informed of the benefits and limitations of each panel, including that expanded  pan-cancer panels contain genes that do not have clear management guidelines at this point in time.  We also discussed that as the number of genes included on a panel increases, the chances of variants of uncertain significance increases.  GENETIC TESTING CONSENT:  After considering the risks, benefits, and limitations, [name] provided informed consent to pursue genetic testing. A blood sample was sent to [facility] for analysis of the CancerNext-Expanded+RNA Panel. Results should be available within approximately 2-3 weeks'  time, at which point they will be disclosed by telephone to [name] , as will any additional recommendations warranted by these results. [name] will receive a summary of her genetic counseling visit and a copy of her results once available. This information will also be available in Epic.  [facility] CancerNext-Expanded + RNAinsight gene panel which includes sequencing, rearrangement, and RNA analysis for the following 77 genes: AIP, ALK, APC, ATM, AXIN2, BAP1, BARD1, BMPR1A, BRCA1, BRCA2, BRIP1, CDC73, CDH1, CDK4, CDKN1B, CDKN2A, CEBPA, CHEK2, CTNNA1, DDX41, DICER1, ETV6, FH, FLCN, GATA2, LZTR1, MAX, MBD4, MEN1, MET, MLH1, MSH2, MSH3, MSH6, MUTYH, NF1, NF2, NTHL1, PALB2, PHOX2B, PMS2, POT1, PRKAR1A, PTCH1, PTEN, RAD51C, RAD51D, RB1, RET, RPS20, RUNX1, SDHA, SDHAF2, SDHB, SDHC, SDHD, SMAD4, SMARCA4, SMARCB1, SMARCE1, STK11, SUFU, TMEM127, TP53, TSC1, TSC2, VHL, and WT1 (sequencing and deletion/duplication); EGFR, HOXB13, KIT, MITF, PDGFRA, POLD1, and POLE (sequencing only); EPCAM and GREM1 (deletion/duplication only).    GENETIC TESTING NATIONAL CRITERIA: Based on [name]'s family history of 3 three related cancers in maternal relatives (2x breast cancer, 1x prostate cancer) she meets medical criteria for genetic testing based on the [employer] (NCCN) guidelines. Though [name] is not personally affected, there are no affected family members that are  willing/able/available to undergo hereditary cancer testing. Therefore, [name] the most informative family member available. Despite that she meets criteria, she may still have an out of pocket cost. We discussed that if her out of pocket cost for testing is over $100, the laboratory will call and confirm whether she wants to proceed with testing.  If the out of pocket cost of testing is less than $100 she will be billed by the genetic testing laboratory.   GENETIC INFORMATION NONDISCRIMINATION ACT (GINA): We discussed that some people do not want to undergo genetic testing due to fear of genetic discrimination.  The Genetic Information Nondiscrimination Act (GINA) was signed into federal law in 2008. GINA prohibits health insurers and most employers from discriminating against individuals based on genetic information (including the results of genetic tests and family history information). According to GINA, health insurance companies cannot consider genetic information to be a preexisting condition, nor can they use it to make decisions regarding coverage or rates. GINA also makes it illegal for most employers to use genetic information in making decisions about hiring, firing, promotion, or terms of employment. It is important to note that GINA does not offer protections for life insurance, disability insurance, or long-term care insurance. GINA does not apply to those in the [employer], those who work for companies with less than 15 employees, and new life insurance or long-term disability insurance policies.  Health status due to a cancer diagnosis is not protected under GINA. More information about GINA can be found by visiting [email or web address].  STATISTICAL MODEL The Tyrer-Cuzick model is one of multiple prediction models developed to estimate an individual's lifetime risk of developing breast cancer. The Tyrer-Cuzick model is endorsed by the [employer] (NCCN). This model  includes many risk factors such as family history, endogenous estrogen exposure, and benign breast disease. The calculation is highly-dependent on the accuracy of clinical data provided by the patient and can change over time. The Tyrer-Cuzick model may be repeated to reflect new information in her personal or family history in the future.   Based on the patient's family history, a the Tyer-Cuzick statistical model was used to estimate her lifetime risk of developing breast cancer. This estimates her lifetime risk of developing  breast cancer to be approximately 29.35%. This estimation does not consider any genetic testing results.  The patient's lifetime breast cancer risk is a preliminary estimate based on available information using one of several models endorsed by the American Cancer Society (ACS). The ACS recommends consideration of breast MRI screening as an adjunct to mammography for patients at high risk (defined as 20% or greater lifetime risk). Please note that a woman's breast cancer risk changes over time. It may increase or decrease based on age and any changes to the personal and/or family medical history. The risks and recommendations listed above apply to this patient at this point in time. In the future, she may or may not be eligible for the same medical management strategies and, in some cases, other medical management strategies may become available to her. If she is interested in an updated breast cancer risk assessment at a later date, she can contact us .  [name] has been determined to be at high risk for breast cancer her Tyrer-Cuzick risk score is 29.35%.  For women with a greater than 20% lifetime risk of breast cancer, the [employer] (NCCN) recommends the following:  1.      Clinical encounter every 6-12 months to begin when identified as being at increased risk, but not before age 70  2.      Annual mammograms. Tomosynthesis is recommended starting 10  years earlier than the youngest breast cancer diagnosis in the family or at age 42 (whichever comes first), but not before age 17   60.      Annual breast MRI starting 10 years earlier than the youngest breast cancer diagnosis in the family or at age 3 (whichever comes first), but not before age 86.    Lastly, we encouraged Ms. Marshman to remain in contact with cancer genetics annually so that we can continuously update the family history and inform her of any changes in cancer genetics and testing that may be of benefit for this family.   Ms. Kitzmiller questions were answered to her satisfaction today. Our contact information was provided should additional questions or concerns arise. Thank you for the referral and allowing us  to share in the care of your patient.   Resources:  [name] was provided with the following:  [facility] Billing information  [facility] Hereditary Cancer Testing Patient Guide [facility] Patient Assistance Information Sheet PLAN:  Testing Ordered: CancerNext-Expanded+RNA Panel Clinic Note Faxed/Routed to [name]'s PCP Janna Ferrier, DO  Referral to High-Risk Breast Clinic regardless of results due to >20% lifetime risk  Santana Fryer, MS, CGC  Certified Genetic Counselor  Email: [email or web address]  Phone: [phone]  I personally spent a total of 60 minutes in the care of the patient today including preparing to see the patient, getting/reviewing separately obtained history, counseling and educating, placing orders, and documenting clinical information in the EHR.  The patient was seen alone. Drs. Lanny Stalls, and/or Gudena were available for questions, if needed. _______________________________________________________________________ For Office Staff:  Number of people involved in session: 1 Was an Intern/ student involved with case: no

## 2024-03-09 ENCOUNTER — Inpatient Hospital Stay: Payer: Self-pay

## 2024-03-09 ENCOUNTER — Other Ambulatory Visit: Payer: Self-pay

## 2024-03-09 DIAGNOSIS — Z8049 Family history of malignant neoplasm of other genital organs: Secondary | ICD-10-CM | POA: Insufficient documentation

## 2024-03-09 DIAGNOSIS — Z800 Family history of malignant neoplasm of digestive organs: Secondary | ICD-10-CM | POA: Insufficient documentation

## 2024-03-09 DIAGNOSIS — Z803 Family history of malignant neoplasm of breast: Secondary | ICD-10-CM | POA: Insufficient documentation

## 2024-03-09 DIAGNOSIS — Z1379 Encounter for other screening for genetic and chromosomal anomalies: Secondary | ICD-10-CM

## 2024-03-09 DIAGNOSIS — Z808 Family history of malignant neoplasm of other organs or systems: Secondary | ICD-10-CM | POA: Insufficient documentation

## 2024-03-09 DIAGNOSIS — Z8042 Family history of malignant neoplasm of prostate: Secondary | ICD-10-CM | POA: Insufficient documentation

## 2024-03-09 LAB — GENETIC SCREENING ORDER

## 2024-03-13 ENCOUNTER — Other Ambulatory Visit: Payer: Self-pay | Admitting: Family Medicine

## 2024-03-13 DIAGNOSIS — R7303 Prediabetes: Secondary | ICD-10-CM

## 2024-03-14 ENCOUNTER — Telehealth: Payer: Self-pay

## 2024-03-14 NOTE — Telephone Encounter (Signed)
 I attempted to contact [name] today to follow-up after meeting with [name] on Friday (03/09/2024) with information about the patient assistance program offered through the lab. I was unable to reach [name] and I will follow-up via MyChart.  Santana Fryer, MS, CGC  Certified Genetic Counselor  Email: [email or web address]  Phone: [phone]

## 2024-03-21 ENCOUNTER — Ambulatory Visit (INDEPENDENT_AMBULATORY_CARE_PROVIDER_SITE_OTHER): Payer: Self-pay | Admitting: Plastic Surgery

## 2024-03-21 VITALS — BP 135/75 | HR 68 | Ht 63.0 in | Wt 174.0 lb

## 2024-03-21 DIAGNOSIS — N63 Unspecified lump in unspecified breast: Secondary | ICD-10-CM

## 2024-03-21 DIAGNOSIS — Z803 Family history of malignant neoplasm of breast: Secondary | ICD-10-CM

## 2024-03-21 DIAGNOSIS — N6459 Other signs and symptoms in breast: Secondary | ICD-10-CM

## 2024-03-22 ENCOUNTER — Encounter: Payer: Self-pay | Admitting: Plastic Surgery

## 2024-03-22 NOTE — Progress Notes (Signed)
 Referring Provider Janna Ferrier, DO [address] [place],  [place] [address]   CC:  Chief Complaint  Patient presents with   Advice Only      [name] is an 43 y.o. female.  HPI: [name] is a 43 year old female who is referred from Dr. Vernetta for discussion of reconstruction after possible bilateral mastectomies for breast cancer risk reduction.  Patient has had several benign breast issues including fibroadenomas and bloody nipple discharge secondary to focal chronic inflammation.  She has a very strong family history of breast cancer and is currently undergoing genetic screening.  She is interested in information regarding the reconstruction process after mastectomies.  The patient has done quite a bit of self education prior to this appointment.  Allergies  Allergen Reactions   Amoxicillin  Hives and Swelling   Ibuprofen  Hives   Penicillins Hives and Swelling    Pt received Ceftriaxone  in 2014 without rxn. Allergy updated by S. Tanda, PharmD (05/25/22)   Shellfish Allergy Hives    Outpatient Encounter Medications as of 03/21/2024  Medication Sig   atorvastatin  (LIPITOR) 20 MG tablet Take 1 tablet (20 mg total) by mouth daily.   carvedilol  (COREG ) 3.125 MG tablet Take 1 tablet (3.125 mg total) by mouth 2 (two) times daily.   cetirizine  (ZYRTEC ) 10 MG tablet Take 1 tablet (10 mg total) by mouth daily.   fluticasone  (FLONASE ) 50 MCG/ACT nasal spray Place 2 sprays into both nostrils daily.   metFORMIN  (GLUCOPHAGE -XR) 500 MG 24 hr tablet TAKE 2 TABLETS BY MOUTH ONCE DAILY WITH BREAKFAST   traMADol  (ULTRAM ) 50 MG tablet Take 1 tablet (50 mg total) by mouth every 6 (six) hours as needed for moderate pain (pain score 4-6) or severe pain (pain score 7-10).   No facility-administered encounter medications on file as of 03/21/2024.     Past Medical History:  Diagnosis Date   Abdominal pain 03/29/2021   Adenoma of left nipple 01/04/2024   0.6 cm hypoechoic mass seen on  mammogram and targeted US  01/03/2024, recommended bilateral breast MRI w/o and w/ contrast for further evaluation indicated.     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   Cystic thyroid  nodule 07/03/2014   Diabetes mellitus without complication (HCC)    Dorsal wrist ganglion 05/20/2021   Eczema    Family history of anesthesia complication    Mother had problem waking up   Family history of brain cancer    Family history of breast cancer    Family history of cervical cancer    Family history of prostate cancer    Family history of stomach cancer    Fatty liver disease, nonalcoholic 09/18/2009   Qualifier: Diagnosis of   By: Edrick MD, Devere         Flank pain 03/29/2021   Ganglion cyst of dorsum of right wrist    Ganglion cyst of volar aspect of right wrist 04/30/2022   Heart palpitations 10/11/2023   Hematochezia 03/08/2023   Hoarseness of voice 05/22/2020   Hx gestational diabetes    Hyperlipidemia 05/20/2021   Hypertension  10/25/2023   Multiple food allergies 10/24/2012   Neuromuscular disorder (HCC)    Obesity, unspecified 07/23/2012   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      09/2016 :   IMPRESSION:  1. No acute intracranial abnormality identified.  2. Diminished 2 mm focus of hypoenhancement within left posterior pituitary may represent scarring or residual microadenoma. No new pituitary mass identified.  3. Otherwise unremarkable MRI of the brain for age.        RUQ pain 02/02/2012   Seasonal allergies 10/24/2012   Spinal headache     Vaginal discharge 01/08/2023   Vision changes 03/01/2021    Past Surgical History:  Procedure Laterality Date   BREAST BIOPSY Right 08/18/2023   US  RT BREAST BX W LOC DEV 1ST LESION IMG BX SPEC US  GUIDE 08/18/2023 [facility]   CESAREAN SECTION     CHOLECYSTECTOMY N/A 09/06/2013   Procedure: LAPAROSCOPIC CHOLECYSTECTOMY;  Surgeon: Vicenta DELENA Poli, MD;  Location: [facility];  Service: General;  Laterality: N/A;   EXCISION OF BREAST BIOPSY Left 02/17/2024   Procedure: EXCISIONAL BIOPSY OF LEFT NIPPLE MASS;  Surgeon: Poli Vicenta, MD;  Location: [facility];  Service: General;  Laterality: Left;  LMA EXCISIONAL BIOPSY LEFT NIPPLE MASS   GANGLION CYST EXCISION Right 08/03/2021   Procedure: RIGHT REMOVAL GANGLION OF WRIST;  Surgeon: Romona Harari, MD;  Location: [facility];  Service: Orthopedics;  Laterality: Right;  regional with monitored anesthesia care   GANGLION CYST EXCISION Right 05/26/2022   Procedure: RIGHT WRIST VOLAR GANGLION CYST REMOVAL;  Surgeon: Jerri Kay HERO, MD;  Location: [facility];  Service: Orthopedics;  Laterality: Right;   NASAL SINUS SURGERY  2003   TUBAL LIGATION      Family History  Problem Relation Age of Onset   Hypertension Mother    Hyperlipidemia Mother    Cervical cancer Mother 54   Anemia Mother        Due to cancer   Breast cancer Mother 52   Diabetes Father    Hypertension Father    Irregular heart beat Father    Arrhythmia Father        Due to blood pressure   Heart attack Father        He had a mild episode   Heart disease Father    Anemia Sister    Asthma Sister    Other Sister        Pituitary Mass   Arrhythmia Brother    Asthma Brother        Since child   Other Brother        Brain Tumor   Seizures Brother    Throat cancer Maternal Grandmother    Arrhythmia Maternal Grandmother    Heart attack Maternal Grandmother    Heart disease Maternal Grandmother    Hypertension Maternal  Grandmother    Prostate cancer Maternal Grandfather 50   Arrhythmia Maternal Grandfather    Hypertension Maternal Grandfather    Arrhythmia Paternal Grandmother    Hypertension Paternal Grandmother    Arrhythmia Paternal Grandfather    Heart attack Paternal Grandfather    Heart disease Paternal Grandfather        He went to sleep and didn't wake up   Hypertension Paternal Grandfather    Allergic rhinitis Daughter    Allergic rhinitis Daughter    Allergic rhinitis Son  milk   Breast cancer Maternal Aunt 60   Stomach cancer Maternal Uncle    Breast cancer Paternal Aunt 75 - 69   Arrhythmia Paternal Aunt        Heart problems   Heart disease Paternal Aunt    Brain cancer Maternal Great-grandmother    Breast cancer Other 45   Breast cancer Other 45   Cancer Other        Face Cancer 24s   Colon cancer Neg Hx    Esophageal cancer Neg Hx    Pancreatic cancer Neg Hx     Social History   Social History Narrative   Not on file     Review of Systems General: Denies fevers, chills, weight loss CV: Denies chest pain, shortness of breath, palpitations Breast: Multiple findings of benign breast disease.  No history of breast cancer.  Physical Exam    03/21/2024    2:21 PM 02/23/2024    2:23 PM 02/17/2024   11:19 AM  Vitals with BMI  Height 5' 3    Weight 174 lbs 170 lbs   BMI 30.83 33.2   Systolic 135 131 874  Diastolic 75 78 77  Pulse 68  66    General:  No acute distress,  Alert and oriented, Non-Toxic, Normal speech and affect Breast: Patient has breast asymmetry with the left side larger than the right.  She has no masses on physical examination.  There is no nipple discharge today.  The base width of her right breast which is the smaller 1 is between 11 and 12 cm. Mammogram: Mammogram and follow-up MRI were read as BI-RADS 3 probably benign with recommendation for referral to a breast surgeon.  This was completed in August 2025 Assessment/Plan Counseling for  reconstruction after possible bilateral mastectomies: I spent approximately 45 minutes reviewing the options for breast reconstruction.  We discussed the use of autologous tissue specifically the tissue below the umbilicus as a DIEP flap for reconstruction.  We discussed how the tissue is harvested and how it is moved to the chest wall and blood flow reestablished.  We discussed the need for prolonged hospitalization and the fact that this is not done at the time of the mastectomies.  If she were to elect autologous tissue reconstruction she would have tissue expanders placed at the time of her mastectomies.  These would be placed in the prepectoral position.  She has some knowledge and interest in this procedure.  I will set up a second appointment for her to discuss free tissue reconstruction with Dr. Montorfano.  We then discussed tissue expander and implant reconstruction.  I showed her a tissue expander and explained how this worked to stretch the skin and provide a pocket for silicone implant.  I then showed her a silicone implant similar to what might be placed for her reconstruction.  We discussed how this is placed either above or below the pectoralis muscle based on the thickness of the skin flaps at the time of her mastectomies.  We discussed the fact that the implants will need to be interrogated by MRI at 5 to 7 years postoperatively and every 2 to 3 years thereafter.  The implants do not need to be replaced at 10 years but only replaced if she is unhappy with them or there is evidence of rupture.  We did discuss the fact that infection is the biggest risk with this procedure and if the tissue expander or the implants become infected they will need  to be removed and the reconstructive process started over again.  She has interest in this technique as well as her goal is to have larger breasts postoperatively than she currently has.  I told her that there is a limit as to how big I can make her and I  believe that tissue expander and implant reconstruction is a better option for her if she is trying to increase her breast size.  All questions were answered to her satisfaction.  Photographs were obtained today with her consent.  Will submit her for possible reconstruction to be coordinated with Dr. Damian office.  A follow-up consult with Dr. Montorfano will be arranged.  Leonce KATHEE Birmingham 03/22/2024, 2:59 PM

## 2024-03-29 ENCOUNTER — Telehealth: Payer: Self-pay

## 2024-03-29 NOTE — Telephone Encounter (Signed)
 Made in Error

## 2024-03-29 NOTE — Progress Notes (Signed)
 HPI:  Ms. Beharry was previously seen in the [facility] due to a personal history of multiple breast biopsies and a strong family history of breast cancer and concerns regarding a hereditary predisposition to cancer. Please refer to our prior cancer genetics clinic note for more information regarding our discussion, assessment and recommendations, at the time. Ms. Bowden recent genetic test results were disclosed to her, as were recommendations warranted by these results. These results and recommendations are discussed in more detail below.  FAMILY HISTORY:  We obtained a detailed, 4-generation family history.  Significant diagnoses are listed below: Family History  Problem Relation Age of Onset   Hypertension Mother    Hyperlipidemia Mother    Cervical cancer Mother 64   Anemia Mother        Due to cancer   Breast cancer Mother 69   Diabetes Father    Hypertension Father    Irregular heart beat Father    Arrhythmia Father        Due to blood pressure   Heart attack Father        He had a mild episode   Heart disease Father    Anemia Sister    Asthma Sister    Other Sister        Pituitary Mass   Arrhythmia Brother    Asthma Brother        Since child   Other Brother        Brain Tumor   Seizures Brother    Throat cancer Maternal Grandmother    Arrhythmia Maternal Grandmother    Heart attack Maternal Grandmother    Heart disease Maternal Grandmother    Hypertension Maternal Grandmother    Prostate cancer Maternal Grandfather 50   Arrhythmia Maternal Grandfather    Hypertension Maternal Grandfather    Arrhythmia Paternal Grandmother    Hypertension Paternal Grandmother    Arrhythmia Paternal Grandfather    Heart attack Paternal Grandfather    Heart disease Paternal Grandfather        He went to sleep and didn't wake up   Hypertension Paternal Grandfather    Allergic rhinitis Daughter    Allergic rhinitis Daughter    Allergic rhinitis Son        milk    Breast cancer Maternal Aunt 60   Stomach cancer Maternal Uncle    Breast cancer Paternal Aunt 50 - 69   Arrhythmia Paternal Aunt        Heart problems   Heart disease Paternal Aunt    Brain cancer Maternal Great-grandmother    Breast cancer Other 45   Breast cancer Other 45   Cancer Other        Face Cancer 19s   Colon cancer Neg Hx    Esophageal cancer Neg Hx    Pancreatic cancer Neg Hx     GENETIC TEST RESULTS: Genetic testing reported out on 03/23/2024 through the CancerNext-Expanded + RNAinsight  Insight panel found no pathogenic mutations. [facility] CancerNext-Expanded + RNAinsight gene panel which includes sequencing, rearrangement, and RNA analysis for the following 77 genes: AIP, ALK, APC, ATM, AXIN2, BAP1, BARD1, BMPR1A, BRCA1, BRCA2, BRIP1, CDC73, CDH1, CDK4, CDKN1B, CDKN2A, CEBPA, CHEK2, CTNNA1, DDX41, DICER1, ETV6, FH, FLCN, GATA2, LZTR1, MAX, MBD4, MEN1, MET, MLH1, MSH2, MSH3, MSH6, MUTYH, NF1, NF2, NTHL1, PALB2, PHOX2B, PMS2, POT1, PRKAR1A, PTCH1, PTEN, RAD51C, RAD51D, RB1, RET, RPS20, RUNX1, SDHA, SDHAF2, SDHB, SDHC, SDHD, SMAD4, SMARCA4, SMARCB1, SMARCE1, STK11, SUFU, TMEM127, TP53, TSC1, TSC2, VHL, and  WT1 (sequencing and deletion/duplication); EGFR, HOXB13, KIT, MITF, PDGFRA, POLD1, and POLE (sequencing only); EPCAM and GREM1 (deletion/duplication only).    The test report has been scanned into EPIC and is located under the Molecular Pathology section of the Results Review tab.  A portion of the result report is included below for reference.   We discussed with Ms. Gittens that because current genetic testing is not perfect, it is possible there may be a gene mutation in one of these genes that current testing cannot detect, but that chance is small.  We also discussed, that there could be another gene that has not yet been discovered, or that we have not yet tested, that is responsible for the cancer diagnoses in the family. It is also possible there is a hereditary cause for the  cancer in the family that Ms. Santini did not inherit and therefore was not identified in her testing.  Therefore, it is important to remain in touch with cancer genetics in the future so that we can continue to offer Ms. Figge the most up to date genetic testing.   Genetic testing did identify 2 variants of uncertain significance (VUS)  One in the ATM gene with specific variant or mutation is found at c.389A>G p.D130G Second in the POLD1 gene, the specific variant or mutation is found at  c.2578G>A p.A860T  At this time, it is unknown if these variants are associated with increased cancer risk or if they are normal findings, but most variants such as these get reclassified to being inconsequential. They should not be used to make medical management decisions. With time, we suspect the lab will determine the significance of these variants, if any. If we do learn more about them, we will try to contact Ms. Wardrop to discuss it further. However, it is important to stay in touch with us  periodically and keep the address and phone number up to date.  ADDITIONAL GENETIC TESTING: We discussed with Ms. Nieland that there are other genes that are associated with increased cancer risk that can be analyzed. Should Ms. Reader wish to pursue additional genetic testing, we are happy to discuss and coordinate this testing, at any time.    We discussed with Ms. Deadwyler that her genetic testing was fairly extensive.  If there are genes identified to increase cancer risk that can be analyzed in the future, we would be happy to discuss and coordinate this testing at that time.    CANCER SCREENING RECOMMENDATIONS: Ms. Kossman test result is considered negative (normal).  This means that we have not identified a hereditary cause for her personal history or her family history of breast cancer at this time. Most cancers happen by chance and this negative test suggests that her family history of cancer may fall into this category.     Possible reasons for Ms. Lio's negative genetic test include:  1. There may be a gene mutation in one of these genes that current testing methods cannot detect but that chance is small.  2. There could be another gene that has not yet been discovered, or that we have not yet tested, that is responsible for the cancer diagnoses in the family.  3.  There may be no hereditary risk for cancer in the family. The cancers in Ms. Khanna and/or her family may be sporadic/familial or due to other genetic and environmental factors. 4. It is also possible there is a hereditary cause for the cancer in the family that Ms. Morency did  not inherit.  Therefore, it is recommended she continue to follow the cancer management and screening guidelines provided by her oncology team and primary healthcare provider. An individual's cancer risk and medical management are not determined by genetic test results alone. Overall cancer risk assessment incorporates additional factors, including personal medical history, family history, and any available genetic information that may result in a personalized plan for cancer prevention and surveillance  Given Ms. Novick personal and family histories, we must interpret these negative results with some caution.  Families with features suggestive of hereditary risk for cancer tend to have multiple family members with cancer, diagnoses in multiple generations and diagnoses before the age of 18. Ms. Kneeland family exhibits some of these features. Thus, this result may simply reflect our current inability to detect all mutations within these genes or there may be a different gene that has not yet been discovered or tested.   An individual's cancer risk and medical management are not determined by genetic test results alone. Overall cancer risk assessment incorporates additional factors, including personal medical history, family history, and any available genetic information that may result in a  personalized plan for cancer prevention and surveillance.  Based on Ms. Lockamy's personal history and family history of breast cancer, her, the Beatrice Harvey statistical models was used to estimate her risk of developing breast cancer. This estimates her lifetime risk of developing breast to be approximately 29.46%.    The patient's lifetime breast cancer risk is a preliminary estimate based on available information using one of several models endorsed by the [employer] (NCCN).  The NCCN recommends consideration of breast MRI screening as an adjunct to mammography for patients at high risk (defined as 20% or greater lifetime risk). Please note that a woman's breast cancer risk changes over time. It may increase or decrease based on age and any changes to the personal and/or family medical history. The risks and recommendations listed above apply to this patient at this point in time. In the future, she may or may not be eligible for the same medical management strategies and, in some cases, other medical management strategies may become available to her. If she is interested in an updated breast cancer risk assessment at a later date, she can contact us .   Ms. Bubb has been determined to be at high risk for breast cancer. Her Tyrer-Cuzick risk score is 29.46%. For women with a greater than 20% lifetime risk of breast cancer, the [employer] (NCCN) recommends the following:   1.      Clinical encounter every 6-12 months to begin when identified as being at increased risk, but not before age 69  2.      Annual mammograms. Tomosynthesis is recommended starting 10 years earlier than the youngest breast cancer diagnosis in the family or at age 51 (whichever comes first), but not before age 61    39.      Annual breast MRI starting 10 years earlier than the youngest breast cancer diagnosis in the family or at age 73 (whichever comes first), but not before age 110.    We, therefore, discussed that it is reasonable for Ms. Hopwood to be followed by a high-risk breast cancer clinic; in addition to a yearly mammogram and physical exam by a healthcare provider, she should discuss the usefulness of an annual breast MRI with the high-risk clinic providers.  RECOMMENDATIONS FOR FAMILY MEMBERS:  Individuals in this family might be at some  increased risk of developing cancer, over the general population risk, simply due to the family history of cancer.  We recommended women in this family have a yearly mammogram beginning at age 71, or 74 years younger than the earliest onset of cancer, an annual clinical breast exam, and perform monthly breast self-exams. Women in this family should also have a gynecological exam as recommended by their primary provider. All family members should be referred for colonoscopy starting at age 85, or 34 years younger than the earliest onset of cancer.  It is also possible there is a hereditary cause for the cancer in Ms. Lindseth's family that she did not inherit and therefore was not identified in her. Based on Ms. Ketterman's family history, we recommended her maternal and paternal aunts.She shared that they live in [place] and she is unsure if they would have access to genetic testing.  FOLLOW-UP: Lastly, we discussed with Ms. Luevano that cancer genetics is a rapidly advancing field and it is possible that new genetic tests will be appropriate for her and/or her family members in the future. We encouraged her to remain in contact with cancer genetics on an annual basis so we can update her personal and family histories and let her know of advances in cancer genetics that may benefit this family.   Our contact number was provided. Ms. Bacot questions were answered to her satisfaction, and she knows she is welcome to call us  at anytime with additional questions or concerns.   Santana Fryer, MS, CGC  Certified Genetic Counselor  Email:  [email or web address]  Phone: [phone]

## 2024-03-29 NOTE — Telephone Encounter (Signed)
 I contacted  [name] to discuss her genetic testing results. No pathogenic variants were identified in the 77 genes analyzed. Two variants of uncertain significance (VUS) were identified.   Her genetic genetic testing identified 2 variants of uncertain significance (VUS)  One in the ATM gene with specific variant or mutation is found at c.389A>G p.D130G Second in the POLD1 gene, the specific variant or mutation is found at  c.2578G>A p.A860T   At this time, it is unknown if these variants are associated with increased cancer risk or if they are normal findings, but most variants such as these get reclassified to being inconsequential. They should not be used to make medical management decisions. With time, we suspect the lab will determine the significance of these variants, if any. If we do learn more about them, we will try to contact [name] to discuss it further. However, it is important to stay in touch with us  periodically and keep the address and phone number up to date.  Discussed that we do not know why she has her benign breast masses or why there is cancer in the family. It could be due to a different gene that we are not testing, or maybe our current technology may not be able to pick something up. It will be important for her to keep in contact with genetics to keep up with whether additional testing may be needed.Detailed clinic note to follow.  The test report will be scanned into EPIC and will be located under the Molecular Pathology section of the Results Review tab.  A portion of the result report is included below for reference.       Plan [name] accepted a referral to [facility] due to her Tyrer-Cuzick risk score is 29.46% (determine if needed based on surgical plan) Referral to social work to meet with therapist to get additional support navigating her care   Janice Fryer, MS, [facility]  Certified Genetic Counselor  Email: [email or web address]   Phone: [phone]

## 2024-04-04 ENCOUNTER — Encounter: Payer: Self-pay | Admitting: General Practice

## 2024-04-04 ENCOUNTER — Ambulatory Visit (INDEPENDENT_AMBULATORY_CARE_PROVIDER_SITE_OTHER): Payer: Self-pay

## 2024-04-04 VITALS — BP 132/83 | HR 75 | Ht 63.0 in | Wt 175.2 lb

## 2024-04-04 DIAGNOSIS — Z6831 Body mass index (BMI) 31.0-31.9, adult: Secondary | ICD-10-CM

## 2024-04-04 DIAGNOSIS — N6459 Other signs and symptoms in breast: Secondary | ICD-10-CM

## 2024-04-04 DIAGNOSIS — Z9189 Other specified personal risk factors, not elsewhere classified: Secondary | ICD-10-CM

## 2024-04-04 DIAGNOSIS — Z803 Family history of malignant neoplasm of breast: Secondary | ICD-10-CM

## 2024-04-04 NOTE — Progress Notes (Signed)
 Plastic & Reconstructive Surgery Patient Visit  Patient: [name] MRN: [id number] Date: 04/04/2024 Surgical Oncologist: Dr. Vernetta  Reason for Consult: Breast Reconstruction   History of Present Illness:  This is a 43 y.o. woman who presents in consultation for breast reconstruction.  Patient has a strong family history of breast cancer and has undergone genetic testing with Dr. Vernetta.  Patient has had several benign breast issues including fibroadenomas and bloody nipple discharge secondary to focal chronic inflammation. Patient saw Dr. Waddell for breast reconstruction options following possible prophylactic bilateral mastectomies.  The patient was referred to me for evaluation by Dr. Waddell for possible DIEP flaps in the future.  Patient has never had abdominal liposuction, only abdominal surgery was C-section.  She currently wears a B cup bra and would ideally like to be a C/D cup.   Body mass index is 31.04 kg/m.  Past Medical History: Past Medical History:  Diagnosis Date   Abdominal pain 03/29/2021   Adenoma of left nipple 01/04/2024   0.6 cm hypoechoic mass seen on mammogram and targeted US  01/03/2024, recommended bilateral breast MRI w/o and w/ contrast for further evaluation indicated.     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   Cystic thyroid  nodule 07/03/2014   Diabetes mellitus without complication (HCC)    Dorsal wrist ganglion 05/20/2021   Eczema    Family history of anesthesia complication    Mother had problem waking up   Family history of brain cancer    Family history of breast cancer    Family history  of cervical cancer    Family history of prostate cancer    Family history of stomach cancer    Fatty liver disease, nonalcoholic 09/18/2009   Qualifier: Diagnosis of   By: Edrick MD, Devere         Flank pain 03/29/2021   Ganglion cyst of dorsum of right wrist    Ganglion cyst of volar aspect of right wrist 04/30/2022   Heart palpitations 10/11/2023   Hematochezia 03/08/2023   Hoarseness of voice 05/22/2020   Hx gestational diabetes    Hyperlipidemia 05/20/2021   Hypertension 10/25/2023   Multiple food allergies 10/24/2012   Neuromuscular disorder (HCC)    Obesity, unspecified 07/23/2012   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      09/2016 :   IMPRESSION:  1. No acute intracranial abnormality identified.  2. Diminished 2 mm focus of hypoenhancement within left posterior pituitary may represent scarring or residual microadenoma. No new pituitary mass identified.  3. Otherwise unremarkable MRI of the brain for age.        RUQ pain 02/02/2012   Seasonal allergies 10/24/2012   Spinal headache    Vaginal discharge 01/08/2023   Vision changes 03/01/2021    Past Surgical History: Past Surgical History:  Procedure Laterality Date   BREAST BIOPSY Right 08/18/2023   US  RT BREAST BX W LOC DEV 1ST LESION IMG BX SPEC US  GUIDE 08/18/2023 [facility] MAMMOGRAPHY   CESAREAN SECTION  CHOLECYSTECTOMY N/A 09/06/2013   Procedure: LAPAROSCOPIC CHOLECYSTECTOMY;  Surgeon: Vicenta DELENA Poli, MD;  Location: [facility];  Service: General;  Laterality: N/A;   EXCISION OF BREAST BIOPSY Left 02/17/2024   Procedure: EXCISIONAL BIOPSY OF LEFT NIPPLE MASS;  Surgeon: Poli Vicenta, MD;  Location: [facility];  Service: General;  Laterality: Left;  LMA EXCISIONAL BIOPSY LEFT NIPPLE MASS   GANGLION CYST EXCISION Right 08/03/2021   Procedure: RIGHT REMOVAL GANGLION OF WRIST;  Surgeon: Romona Harari, MD;   Location: [facility];  Service: Orthopedics;  Laterality: Right;  regional with monitored anesthesia care   GANGLION CYST EXCISION Right 05/26/2022   Procedure: RIGHT WRIST VOLAR GANGLION CYST REMOVAL;  Surgeon: Jerri Kay HERO, MD;  Location: [facility];  Service: Orthopedics;  Laterality: Right;   NASAL SINUS SURGERY  2003   TUBAL LIGATION      Current Medications: Current Outpatient Medications on File Prior to Visit  Medication Sig Dispense Refill   atorvastatin  (LIPITOR) 20 MG tablet Take 1 tablet (20 mg total) by mouth daily. 90 tablet 3   carvedilol  (COREG ) 3.125 MG tablet Take 1 tablet (3.125 mg total) by mouth 2 (two) times daily. 180 tablet 3   cetirizine  (ZYRTEC ) 10 MG tablet Take 1 tablet (10 mg total) by mouth daily. 30 tablet 11   fluticasone  (FLONASE ) 50 MCG/ACT nasal spray Place 2 sprays into both nostrils daily. 16 g 5   metFORMIN  (GLUCOPHAGE -XR) 500 MG 24 hr tablet TAKE 2 TABLETS BY MOUTH ONCE DAILY WITH BREAKFAST 90 tablet 0   traMADol  (ULTRAM ) 50 MG tablet Take 1 tablet (50 mg total) by mouth every 6 (six) hours as needed for moderate pain (pain score 4-6) or severe pain (pain score 7-10). 25 tablet 0   No current facility-administered medications on file prior to visit.    Allergies: Allergies  Allergen Reactions   Amoxicillin  Hives and Swelling   Ibuprofen  Hives   Penicillins Hives and Swelling    Pt received Ceftriaxone  in 2014 without rxn. Allergy updated by S. Tanda, PharmD (05/25/22)   Shellfish Allergy Hives    Family History:  No family history is negative for bleeding/clotting disorders, problems with anesthesia, connective tissue disorders.   Social History:  Social History   Socioeconomic History   Marital status: Married    Spouse name: Not on file   Number of children: 3   Years of education: Not on file   Highest education level: 12th grade  Occupational History   Not on file  Tobacco Use   Smoking status: Never     Passive exposure: Never   Smokeless tobacco: Never  Vaping Use   Vaping status: Never Used  Substance and Sexual Activity   Alcohol use: Yes    Comment: occ.   Drug use: No   Sexual activity: Yes    Birth control/protection: Surgical    Comment: BTL  Other Topics Concern   Not on file  Social History Narrative   Not on file   Social Drivers of Health   Financial Resource Strain: Low Risk  (01/06/2024)   Overall Financial Resource Strain (CARDIA)    Difficulty of Paying Living Expenses: Not hard at all  Food Insecurity: Food Insecurity Present (02/23/2024)   Hunger Vital Sign    Worried About Running Out of Food in the Last Year: Sometimes true    Ran Out of Food in the Last Year: Sometimes true  Transportation Needs: No Transportation Needs (02/23/2024)  PRAPARE - Administrator, Civil Service (Medical): No    Lack of Transportation (Non-Medical): No  Physical Activity: Inactive (01/06/2024)   Exercise Vital Sign    Days of Exercise per Week: 0 days    Minutes of Exercise per Session: Not on file  Stress: Stress Concern Present (01/06/2024)   [employer] of Occupational Health - Occupational Stress Questionnaire    Feeling of Stress: Rather much  Social Connections: Moderately Integrated (01/06/2024)   Social Connection and Isolation Panel    Frequency of Communication with Friends and Family: Twice a week    Frequency of Social Gatherings with Friends and Family: Once a week    Attends Religious Services: More than 4 times per year    Active Member of [employer] or Organizations: No    Attends Engineer, Structural: Not on file    Marital Status: Married   She is a never smoker. Denies recreational drug use.   Review of systems: 10 point review of systems performed and negative except as noted in the HPI.  Physical Exam: BP 132/83 (BP Location: Left Arm, Patient Position: Sitting, Cuff Size: Normal)   Pulse 75   Ht 5' 3 (1.6 m)   Wt 175 lb 3.2  oz (79.5 kg)   SpO2 100%   BMI 31.04 kg/m  Body mass index is 31.04 kg/m. General: Well appearing, no apparent distress. Pulm: Breathing comfortably on room air without sounds/wheezing. CV: Regular rate. Good perfusion of extremities. Chest: Chest wall without abnormality or obvious deformity or asymmetry.  Breast: Bilateral grade 2 ptosis. Breasts are overall symmetric with regards to shape and contour. Left breast appears larger than right by about 100 grams. Bilateral NAC viable and sensate. Papules everted without discharge. NACs positioned along breast meridian bilaterally. Skin quality is good. There are no skin lesions, striae, or dimpling. There are prior incisions from biopsy sites.  - Breast measurements:  Measurements  Right Breast (cm)  Left Breast (cm)   Base Width 12 13  SN - Nipple 23 27  Nipple - IMF 9 10  NAC diameter 5*5 6*6  Internipple Distance 23  Abdomen: Soft, nondistended, nontender to palpation. No palpable umbilical hernia.  7 cm of diastasis appreciated. There are prior incisions from C-section. Skin quality is not optimal. There is sufficient skin and subcutaneous tissue for DIEP reconstruction of breast to about B-C cup.  Neuro: Moving all four extremities spontaneously.  Psych: Appropriate mood and affect.   Labs: Recent Results (from the past 2160 hours)  Lipid Panel     Status: None   Collection Time: 01/31/24 12:20 PM  Result Value Ref Range   Cholesterol, Total 133 100 - 199 mg/dL   Triglycerides 861 0 - 149 mg/dL   HDL 43 >60 mg/dL   VLDL Cholesterol Cal 24 5 - 40 mg/dL   LDL Chol Calc (NIH) 66 0 - 99 mg/dL   Chol/HDL Ratio 3.1 0.0 - 4.4 ratio    Comment:                                   T. Chol/HDL Ratio                                             Men  Women  1/2 Avg.Risk  3.4    3.3                                   Avg.Risk  5.0    4.4                                2X Avg.Risk  9.6    7.1                                 3X Avg.Risk 23.4   11.0   Basic metabolic panel per protocol     Status: Abnormal   Collection Time: 02/13/24 12:58 PM  Result Value Ref Range   Sodium 138 135 - 145 mmol/L   Potassium 4.8 3.5 - 5.1 mmol/L   Chloride 108 98 - 111 mmol/L   CO2 23 22 - 32 mmol/L   Glucose, Bld 135 (H) 70 - 99 mg/dL    Comment: Glucose reference range applies only to samples taken after fasting for at least 8 hours.   BUN 13 6 - 20 mg/dL   Creatinine, Ser 9.31 0.44 - 1.00 mg/dL   Calcium  9.0 8.9 - 10.3 mg/dL   GFR, Estimated >39 >39 mL/min    Comment: (NOTE) Calculated using the CKD-EPI Creatinine Equation (2021)    Anion gap 7 5 - 15    Comment: Performed at [facility], [address], [place], [place] [address]  Pregnancy, urine POC     Status: None   Collection Time: 02/17/24  9:16 AM  Result Value Ref Range   Preg Test, Ur NEGATIVE NEGATIVE    Comment:        THE SENSITIVITY OF THIS METHODOLOGY IS >20 mIU/mL.   Glucose, capillary     Status: Abnormal   Collection Time: 02/17/24  9:22 AM  Result Value Ref Range   Glucose-Capillary 111 (H) 70 - 99 mg/dL    Comment: Glucose reference range applies only to samples taken after fasting for at least 8 hours.  Surgical pathology     Status: None   Collection Time: 02/17/24 10:14 AM  Result Value Ref Range   SURGICAL PATHOLOGY      SURGICAL PATHOLOGY CASE: [id number] PATIENT: Chiara Hillhouse Surgical Pathology Report     Clinical History: left nipple mass with bloody nipple discharge (cm)     FINAL MICROSCOPIC DIAGNOSIS:  A. BREAST, LEFT NIPPLE, EXCISION:      Benign nipple tissue with focal chronic inflammation, ectatic mammary ducts and periductal inflammation.      Negative for atypia or malignancy.   GROSS DESCRIPTION:  Specimen is received fresh and consists of a 1.7 x 0.9 cm tan-brown, crusted ellipse of skin, with underlying tan-yellow soft tissue excised to a depth of 1.0 cm.  Resection margin is inked  black, the specimen is serially sectioned and entirely submitted in 2 cassettes.  (KL 02/17/2024)  Final Diagnosis performed by Pepper Dutton, MD.   Electronically signed 02/20/2024 Technical and / or Professional components performed at [facility]. [facility], [address] [place], [place] [address].  Immunohistochemistry Technical component (if applicable) was performed  at [facility]. [address], [place], [place] [address].   IMMUNOHISTOCHEMISTRY DISCLAIMER (if applicable): Some of these immunohistochemical stains may have  been developed and the performance characteristics determine by [facility]. Some may not have been cleared or approved by the U.S. Food and Drug Administration. The FDA has determined that such clearance or approval is not necessary. This test is used for clinical purposes. It should not be regarded as investigational or for research. This laboratory is certified under the Clinical Laboratory Improvement Amendments of 1988 (CLIA-88) as qualified to perform high complexity clinical laboratory testing.  The controls stained appropriately.   IHC stains are performed on formalin fixed, paraffin embedded tissue using a 3,3diaminobenzidine (DAB) chromogen and Leica Bond Autostainer System. The staining intensity of the nucleus is score manually and is reported as the percenta ge of tumor cell nuclei demonstrating specific nuclear staining. The specimens are fixed in 10% Neutral Formalin for at least 6 hours and up to 72hrs. These tests are validated on decalcified tissue. Results should be interpreted with caution given the possibility of false negative results on decalcified specimens. Antibody Clones are as follows ER-clone 21F, PR-clone 16, Ki67- clone MM1. Some of these immunohistochemical stains may have been developed and the performance characteristics determined by [facility].   Glucose, capillary      Status: Abnormal   Collection Time: 02/17/24 10:42 AM  Result Value Ref Range   Glucose-Capillary 139 (H) 70 - 99 mg/dL    Comment: Glucose reference range applies only to samples taken after fasting for at least 8 hours.  Genetic Screening Order     Status: None   Collection Time: 03/09/24 10:29 AM  Result Value Ref Range   Genetic Screening Order Collected by Laboratory     Comment: Performed at [facility], [address], [place], [place] [address]   Imaging/Pathology:              Assessment: In summary, this is a pleasant 43 y.o. year-old woman with PMH and PSH as described above and newly diagnosed breast cancer presenting for consultation for breast reconstruction with DIEPs in anticipation of possible phylactic bilateral mastectomies with Dr. Vernetta.  Dr. Waddell saw the patient for possible reconstruction after mastectomies.  Patient was referred to me to discuss reconstruction options, focusing on autologous tissue.  I had a long discussion with the patient regarding her options for breast reconstruction. All this information discussed is included in a patient education document given to the patient.   The patient was advised that timing of reconstruction can be either immediate (at the time of mastectomy), or delayed (at a separate operation after the mastectomy), and there are advantages and disadvantages to both, depending upon the need for additional cancer treatment. We discussed that breast reconstruction has limitations. We discussed that a reconstruction can have shape issues, be painful and is largely insensate. It is never the same as a natural breast. The general surgical risks discussed included wound infections, bleeding, scarring, chronic pain, fluid build up (seroma), and wound breakdown needing wound care, sometimes even a wound vac. Death can occur with any surgery. Sometimes a blood clot (DVT) can develop that may travel to the  lungs (PE) and can be fatal. The mastectomy can have skin loss not infrequently and this can have negative implications for our reconstruction results both aesthetically and complications wise, especially increasing the risk of implant removal and the need for skin removal leading to poor aesthetics. Sometimes chronic pain syndromes can ensue that are difficult to manage. Aesthetic outcomes were discussed in detail and the patient understands that asymmetries are  common, aesthetic disappointment is possible and that revision rates are very high after all types of reconstruction to try get the result as best as possible. The patient was told that there are limitations of reconstruction, limited sometimes by the patient's characteristics and how they heal and that outcomes sometimes are unpredictable.  1. Implant based reconstruction:  The usual process of placing an expander and postoperative care was discussed. We also discussed the option of DTI (direct to implant) reconstruction. We discussed the potential use of "graft" material and the potential of a slight increase in infection. We contrasted this to the benefits of its use also. We compared and contrasted the different implants and the FDA concerns and recommendations. Specifically the patient understands that silicone implants are not implicated in autoimmune diseases but that there is a potential risk of a condition called ALCL, a lymphoma type condition that occurs particularly with the use of textures implants. The FDA and ASPS has not changed their stance on implants. Surgical risks besides the risks above, that were discussed, included the following, infection leading to possible loss of the expander/implant, chronic pain, aesthetic complications such as rippling, asymmetry and animation (unwanted jumping or squashing of the implant when the chest muscle contracts). We also discussed the common complication of capsular contracture that often will need  revisional surgery and can be painful. We quoted national data from our society of 40% revision rates at 7 years and that revisions in other two stage or DTI reconstruction was common. We discussed pre-pectoral reconstruction, placing the devices in front of the muscle, and that this is fairly new. We and others think it has many benefits including preventing the animation deformity and postoperative pain is much improved. The deleterious effects of radiation were discussed and that in this scenario we would only consider expanders, not direct to implant and not a flap initially. It is possible to do reconstruction but the patient understands that this has a high rate of complications.  After radiation in delayed reconstruction scenarios, a flap such as a DIEP or a back (LDMF) flap are most often needed. The risks associated with implant based breast reconstruction were discussed including: infection, seroma, hematoma, skin flap/nipple necrosis, change/loss of breast or nipple sensation, implant failure, silicone concerns, capsular contracture, Anaplastic large cell lymphoma, asymmetry, the need for secondary and tertiary procedures, donor site morbidity, scars, need for possible drains and postoperative antibiotics, fat necrosis, pain, reconstruction failure, DVT/PE, stroke and heart attack.   2. DIEP Autologous tissue reconstruction with a DIEP (tummy fat and skin) flap was described and discussed in detail. For patients with adequate fatty tissue of the lower abdomen this is a good option. The usual postoperative recovery of 6-8 weeks and sometimes longer was discussed and the usual postoperative course was discussed.  Skin loss from the mastectomy, which is not uncommon, can affect our reconstruction efforts negatively by forcing us  to use a much bigger skin island, having higher risks of infection and skin breakdown for example and even compromising the flap survival. Risks associated with autologous based  breast reconstruction were discussed including: bleeding, infection, seroma, scarring, vessel thrombosis, partial or total flap loss, asymmetry, need for secondary and tertiary procedures, fat necrosis, DVT/PE, stroke and heart attack. During surgery, the mastectomy flaps are interrogated to determine their vascularity and perfusion status. Should the mastectomy flaps show sign of compromise, an intraoperative decision may be made to delay reconstruction. Risks discussed over and above the usual risks discussed included but were not  limited to complete flap failure and the need for another type of reconstruction, chronic pain, abdominal bulge or hernia, scarring, skin breakdown, seroma (fluid build up); all sometimes needing further intervention including surgery. Hard areas (fat necrosis) can develop which are only dealt with if they are noticeable or painful. Wound complications are common and very common in ladies that have a higher BMI. I discussed this risk as being almost 100% but that the flap is still a good option given that implants have high risks also in these patients. Bleeding, infection and blood clots that could travel to the lungs are possible and walking after surgery is very important to help prevent these clots. Asymmetries, irregularities, size issues, projection issues and other shape concerns can arise and often do, that sometimes are amenable to revisions but sometimes are just a limitation of the outcome. We discussed that symmetry procedures are often necessary and are part of the reconstructive process.   3. The Latissimus Dorsi flap (Back Muscle Flap) with an expander was discussed. The patient understands the procedure, postop course and use of the different types of implants. The risk, discussed are similar to the other types of implant, expander based reconstruction except that seroma (fluid build up) risk of 18% was highlighted. This sometimes can require further surgery. Sometimes  there is a risk of decreased shoulder mobility and chronic pain is possible but this is usually well tolerated form of reconstruction.   4. LICAP/Goldilocks We discussed breast reconstruction with local tissue. Specifically, The Goldilock's procedure with possible LICAP flaps for additional volume. We discussed the limitations of this technique including that the viability of LICAP flaps could be compromised by the mastectomy itself. We discussed the possibility of revision operations with fat grafting and/or implants in the future if she desires more volume. I would plan to doppler out the LICAP perforators preoperatively if possible to avoid injury during mastectomy.   5. We also discussed the [facility] Health Act of 1998. All the questions were answered. The patient has all the information to make an informed decision and has a good understanding of risks, benefits and limitations.  She has all the prerequisite information to make an informed decision.   6. Opioids: With regard to opioid prescriptions, the laws have been discussed, the patient has been thoroughly evaluated. The ICD-10 code is documented in the chart and will be placed on any prescriptions. We often use a multi modal approach to pain management. If further unanticipated prescribing of opioids is needed, the patient will need to visit her primary doctor or a pain specialist.      In summary, this is a very pleasant 43 y.o. F with newly diagnosed breast who presents to discuss breast reconstruction in anticipation of possible bilateral prophylactic mastectomies with Dr. Vernetta and tissue expander placement with Dr. Waddell.    I explained to the patient that if bilateral prophylactic mastectomies are indicated they are many ways to proceed from a reconstruction standpoint.  If the mastectomies are nipple sparing, I would like to perform a mastopexy first, followed by prophylactic mastectomies by Dr. Vernetta and tissue expander  placement versus DTI by Dr. Waddell, depending on if the patient is a candidate for DTI after mastectomies, and if she wants implants as final reconstruction versus DIEP flaps.  If mastectomies are not nipple sparing, patient will require mastopexy first.  Patient will see Dr. Vernetta next week and follow-up in our clinic the following week.    The time documented represents the  total time spent on the day of the encounter in preparing for and completing the visit. It does not include time spent by ancillary staff, a resident, a fellow, another trainee, or, for shared visits, time spent jointly with the patient or discussing the case or the performance of other separately performed services.   Time spent: 45 minutes.    Kattia Selley, MD [facility]   04/04/2024 1:48 PM

## 2024-04-04 NOTE — Progress Notes (Signed)
 SPIRITUAL CARE AND COUNSELING CONSULT NOTE   VISIT SUMMARY Referred by Zoe Lindsey-Mills in Genetic Counseling for support/resources related to the distress and isolation of pursuing prophylactic bilateral mastectomies. Reached Ms [name] by phone, explaining that most [facility] resources are tailored to cancer patients and offering short-term Spiritual Care support to identify needs and then community counseling referral for longer-term support. [name] was very appreciative of call and plans to phone back when she is ready for next steps.   SPIRITUAL ENCOUNTER                                                                                                                                                                      Type of Visit: Initial Care provided to:: Patient Referral source: Clinical staff Reason for visit: Urgent spiritual support   SPIRITUAL FRAMEWORK  Presenting Themes: Goals in life/care, Significant life change, Impactful experiences and emotions, Courage hope and growth Strengths: Interest in support Needs/Challenges/Barriers: Less common set of needs (more difficult to find support resources) Patient Stress Factors: Health changes, Loss of control   GOALS   Self/Personal Goals: Patient plans to phone chaplain as needed for follow-up support Clinical Care Goals: Provide future availability for support/resource referrals   INTERVENTIONS   Spiritual Care Interventions Made: Established relationship of care and support, Compassionate presence, Reflective listening, Normalization of emotions    INTERVENTION OUTCOMES   Outcomes: Awareness of support, Connection to values and goals of care, Reduced isolation  SPIRITUAL CARE PLAN   Spiritual Care Issues Still Outstanding: Chaplain will continue to follow Follow up plan : Patient plans to phone chaplain for resource referral or further support as she advances in her health care process    Chaplain Olam Filiberto Lemming,  [facility] Pager [phone] Voicemail [phone]

## 2024-04-10 ENCOUNTER — Ambulatory Visit: Payer: Self-pay | Admitting: Orthopaedic Surgery

## 2024-04-10 ENCOUNTER — Other Ambulatory Visit: Payer: Self-pay | Admitting: Surgery

## 2024-04-12 ENCOUNTER — Ambulatory Visit (INDEPENDENT_AMBULATORY_CARE_PROVIDER_SITE_OTHER): Payer: Self-pay

## 2024-04-12 VITALS — BP 132/75 | HR 69 | Ht 63.0 in | Wt 175.0 lb

## 2024-04-12 DIAGNOSIS — Z9189 Other specified personal risk factors, not elsewhere classified: Secondary | ICD-10-CM

## 2024-04-12 DIAGNOSIS — Z803 Family history of malignant neoplasm of breast: Secondary | ICD-10-CM

## 2024-04-12 DIAGNOSIS — N63 Unspecified lump in unspecified breast: Secondary | ICD-10-CM

## 2024-04-12 DIAGNOSIS — N6459 Other signs and symptoms in breast: Secondary | ICD-10-CM

## 2024-04-12 NOTE — Progress Notes (Signed)
   Established Patient Office Visit  Subjective   Patient ID: [name], female    DOB: May 03, 1981  Age: 43 y.o. MRN: [id number]  Chief Complaint  Patient presents with   Follow-up    HPI  This is a 43 y.o. woman who presents in consultation for breast reconstruction.  Patient has a strong family history of breast cancer and has undergone genetic testing with Dr. Vernetta.  Patient has had several benign breast issues including fibroadenomas and bloody nipple discharge secondary to focal chronic inflammation. Patient saw Dr. Waddell for breast reconstruction options following prophylactic bilateral mastectomies.  The patient was referred to me for evaluation by Dr. Waddell for possible DIEP flaps in the future.  Patient has never had abdominal liposuction, only abdominal surgery was C-section. The currently wears a B cup bra and would ideally like to be a C/D cup.   Patient had a conversation with Dr. Vernetta this week and the decision was made to proceed with bilateral mastectomies, no nipple sparing.  Patient has discussed reconstructive options with the husband and has decided to proceed with a DIEP flap afterwards.  Comes for follow-up.  Objective:     BP 132/75 (BP Location: Left Arm, Patient Position: Sitting, Cuff Size: Normal)   Pulse 69   Ht 5' 3 (1.6 m)   Wt 175 lb (79.4 kg)   SpO2 100%   BMI 31.00 kg/m  BP Readings from Last 3 Encounters:  04/12/24 132/75  04/04/24 132/83  03/21/24 135/75      Physical Exam MA as chaperone Alert oriented x 3 Hemodynamically normal Reading without difficulty Rest of the exam deferred this visit  No results found for any visits on 04/12/24.  Last CBC Lab Results  Component Value Date   WBC 9.3 10/14/2023   HGB 12.3 10/14/2023   HCT 37.0 10/14/2023   MCV 92.5 10/14/2023   MCH 30.8 10/14/2023   RDW 13.1 10/14/2023   PLT 313 10/14/2023   Last metabolic panel Lab Results  Component Value Date   GLUCOSE 135 (H)  02/13/2024   NA 138 02/13/2024   K 4.8 02/13/2024   CL 108 02/13/2024   CO2 23 02/13/2024   BUN 13 02/13/2024   CREATININE 0.68 02/13/2024   GFRNONAA >60 02/13/2024   CALCIUM  9.0 02/13/2024   PROT 6.9 08/09/2023   ALBUMIN 3.5 08/09/2023   LABGLOB 2.6 01/07/2023   AGRATIO 1.3 03/25/2021   BILITOT 0.6 08/09/2023   ALKPHOS 64 08/09/2023   AST 22 08/09/2023   ALT 18 08/09/2023   ANIONGAP 7 02/13/2024      The 10-year ASCVD risk score (Arnett DK, et al., 2019) is: 1.4%    Assessment & Plan:   Problem List Items Addressed This Visit       Other   Breast mass in female   Family history of breast cancer   Other Visit Diagnoses       At risk for breast cancer    -  Primary      Patient will proceed with bilateral mastectomies with Dr. Vernetta followed by tissue expander reconstruction by Dr. Waddell.  Patient will follow-up with me after expansion to plan for the deep flaps.  She will need a CTA of the abdomen and pelvis for surgical planning before surgery.  Patient expressed understanding and agrees with the plan.  All questions answered to patient satisfaction  No follow-ups on file.    Julann Mcgilvray M Alegria Dominique, MD

## 2024-04-12 NOTE — Addendum Note (Signed)
 Addended by: EUSTACIO POUR on: 04/12/2024 01:20 PM   Modules accepted: Level of Service

## 2024-04-17 ENCOUNTER — Ambulatory Visit: Payer: Self-pay | Admitting: Orthopaedic Surgery

## 2024-04-24 ENCOUNTER — Other Ambulatory Visit: Payer: Self-pay

## 2024-04-24 ENCOUNTER — Ambulatory Visit: Payer: Self-pay | Admitting: Orthopaedic Surgery

## 2024-04-24 DIAGNOSIS — M25511 Pain in right shoulder: Secondary | ICD-10-CM

## 2024-04-24 DIAGNOSIS — G8929 Other chronic pain: Secondary | ICD-10-CM

## 2024-04-24 MED ORDER — METHYLPREDNISOLONE 4 MG PO TBPK
ORAL_TABLET | ORAL | 0 refills | Status: DC
  Filled 2024-04-24: qty 21, 6d supply, fill #0

## 2024-04-24 MED ORDER — MELOXICAM 15 MG PO TABS
15.0000 mg | ORAL_TABLET | Freq: Every day | ORAL | 2 refills | Status: DC
  Filled 2024-04-24: qty 14, 14d supply, fill #0
  Filled 2024-05-23: qty 14, 14d supply, fill #1

## 2024-04-24 NOTE — Progress Notes (Signed)
 Office Visit Note   Patient: [name]           Date of Birth: 1980/12/29           MRN: [id number] Visit Date: 04/24/2024              Requested by: Janna Ferrier, DO [address] [place],  [place] [address] PCP: Janna Ferrier, DO   Assessment & Plan: Visit Diagnoses:  1. Chronic right shoulder pain     Plan: History of Present Illness TING CAGE is a 43 year old female who presents with recurrent right volar wrist ganglion cyst and hand pain, shoulder elbow pain.  She has a recurrent wrist ganglion cyst that returned about two months after prior surgery. It fluctuates in size and is currently small but causes discomfort.  She has hand pain and weakness involving the thumb and fingers. She describes marked loss of grip strength and difficulty closing her hand or holding objects like bottles. One finger crosses over with movement and another becomes numb.  She also has shoulder pain with tightness that limits activities at home and work. Pain and tightness worsen with reaching back and when sleeping on that side, which can wake her with a numb hand. She notes tingling in the arm. Mild neck pain occurs only with pressure.  She is scheduled for prophylactic mastectomy on January 13th and is concerned about managing her hand and shoulder symptoms around the recovery period.  Physical Exam MUSCULOSKELETAL: right shoulder normal ROM, adequate strength, negative spurling's, tenderness in trapezius muscle, slight pain with hawkin's impingement sign  Assessment and Plan Chronic right shoulder pain No structural issues identified. Likely inflammation and overuse. Symptoms atypical. - Prescribed anti-inflammatory medication. - Initiated physical therapy for neck and shoulder. - Consider MRI if no improvement.  Right hand and wrist pain with recurrent ganglion cyst Recurrent ganglion cyst with pain and finger weakness. Possible arthritis. Surgery deferred due to upcoming  mastectomy. - Referred to Dr. Erwin, hand specialist. - Advised to delay surgery until post-mastectomy recovery.  Follow-Up Instructions: No follow-ups on file.   Orders:  Orders Placed This Encounter  Procedures   Ambulatory referral to Physical Therapy   Meds ordered this encounter  Medications   methylPREDNISolone  (MEDROL  DOSEPAK) 4 MG TBPK tablet    Sig: Take as directed    Dispense:  21 tablet    Refill:  0   meloxicam  (MOBIC ) 15 MG tablet    Sig: Take 1 tablet (15 mg total) by mouth daily.    Dispense:  14 tablet    Refill:  2      Procedures: No procedures performed   Clinical Data: No additional findings.   Subjective: Chief Complaint  Patient presents with   Right Wrist - Pain   Right Hand - Pain    HPI  Review of Systems  Constitutional: Negative.   HENT: Negative.    Eyes: Negative.   Respiratory: Negative.    Cardiovascular: Negative.   Endocrine: Negative.   Musculoskeletal: Negative.   Neurological: Negative.   Hematological: Negative.   Psychiatric/Behavioral: Negative.    All other systems reviewed and are negative.    Objective: Vital Signs: There were no vitals taken for this visit.  Physical Exam Vitals and nursing note reviewed.  Constitutional:      Appearance: She is well-developed.  HENT:     Head: Atraumatic.     Nose: Nose normal.  Eyes:     Extraocular Movements:  Extraocular movements intact.  Cardiovascular:     Pulses: Normal pulses.  Pulmonary:     Effort: Pulmonary effort is normal.  Abdominal:     Palpations: Abdomen is soft.  Musculoskeletal:     Cervical back: Neck supple.  Skin:    General: Skin is warm.     Capillary Refill: Capillary refill takes less than 2 seconds.  Neurological:     Mental Status: She is alert. Mental status is at baseline.  Psychiatric:        Behavior: Behavior normal.        Thought Content: Thought content normal.        Judgment: Judgment normal.     Ortho  Exam  Specialty Comments:  CT CERVICAL SPINE WITHOUT CONTRAST   TECHNIQUE: Multidetector CT imaging of the cervical spine was performed without intravenous contrast. Multiplanar CT image reconstructions were also generated.   RADIATION DOSE REDUCTION: This exam was performed according to the departmental dose-optimization program which includes automated exposure control, adjustment of the mA and/or kV according to patient size and/or use of iterative reconstruction technique.   COMPARISON:  None Available.   FINDINGS: Alignment: There is mild reversal of the normal cervical spine lordosis.   Skull base and vertebrae: No acute fracture. No primary bone lesion or focal pathologic process.   Soft tissues and spinal canal: No prevertebral fluid or swelling. No visible canal hematoma.   Disc levels: Normal multilevel endplates are seen with normal multilevel intervertebral disc spaces.   Normal, bilateral multilevel facet joints are noted.   Upper chest: Negative.   Other: None.   IMPRESSION: 1. No acute fracture or subluxation in the cervical spine. 2. Mild reversal of the normal cervical spine lordosis, which may be due to positioning or muscle spasm.     Electronically Signed   By: Suzen Dials M.D.   On: 06/12/2022 21:23  Imaging: No results found.   PMFS History: Patient Active Problem List   Diagnosis Date Noted   Family history of cervical cancer    Family history of prostate cancer    Family history of breast cancer    Family history of stomach cancer    Family history of brain cancer    Adenoma of left nipple 01/04/2024   Carotid artery disease 10/25/2023   Hypertension 10/25/2023   Angio-edema    Complication of anesthesia    Diabetes mellitus without complication (HCC)    Eczema    Family history of anesthesia complication    Hx gestational diabetes    Spinal headache    Heart palpitations 10/11/2023   Hematochezia 03/08/2023   Vaginal  discharge 01/08/2023   Ganglion cyst of volar aspect of right wrist 04/30/2022   Ganglion cyst of dorsum of right wrist    Hyperlipidemia 05/20/2021   Dorsal wrist ganglion 05/20/2021   Flank pain 03/29/2021   Abdominal pain 03/29/2021   Vision changes 03/01/2021   Hoarseness of voice 05/22/2020   Chest pain of uncertain etiology 08/01/2019   Breast nodule 10/06/2017   Breast mass in female 10/06/2017   Congenital vertical talus deformity, 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   Cystic thyroid  nodule 07/03/2014   Diabetes mellitus without complication (HCC)    Dorsal wrist ganglion 05/20/2021   Eczema    Family history of anesthesia complication    Mother had problem waking up   Family history of brain cancer    Family history of breast cancer    Family history of cervical cancer    Family history of prostate cancer    Family history of stomach cancer    Fatty liver disease, nonalcoholic  09/18/2009   Qualifier: Diagnosis of   By: Edrick MD, Devere         Flank pain 03/29/2021   Ganglion cyst of dorsum of right wrist    Ganglion cyst of volar aspect of right wrist 04/30/2022   Heart palpitations 10/11/2023   Hematochezia 03/08/2023   Hoarseness of voice 05/22/2020   Hx gestational diabetes    Hyperlipidemia 05/20/2021   Hypertension 10/25/2023   Multiple food allergies 10/24/2012   Neuromuscular disorder (HCC)    Obesity, unspecified 07/23/2012   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      09/2016 :   IMPRESSION:  1. No acute intracranial abnormality identified.  2. Diminished 2 mm focus of hypoenhancement within left posterior pituitary may represent scarring or residual microadenoma. No new pituitary mass identified.  3. Otherwise unremarkable MRI of the brain for age.        RUQ pain 02/02/2012   Seasonal allergies 10/24/2012   Spinal headache    Vaginal discharge 01/08/2023   Vision changes 03/01/2021    Family History  Problem Relation Age of Onset   Hypertension Mother    Hyperlipidemia Mother    Cervical cancer Mother 46   Anemia Mother        Due to cancer   Breast cancer Mother 67   Diabetes Father    Hypertension Father    Irregular heart beat Father    Arrhythmia Father        Due to blood pressure   Heart attack Father        He had a mild episode   Heart disease Father    Anemia Sister    Asthma Sister    Other Sister        Pituitary Mass   Arrhythmia Brother    Asthma Brother        Since child   Other Brother        Brain Tumor   Seizures Brother    Throat cancer Maternal Grandmother    Arrhythmia Maternal Grandmother    Heart attack Maternal Grandmother    Heart disease Maternal Grandmother    Hypertension Maternal Grandmother    Prostate cancer Maternal Grandfather 41  Arrhythmia Maternal Grandfather    Hypertension Maternal Grandfather     Arrhythmia Paternal Grandmother    Hypertension Paternal Grandmother    Arrhythmia Paternal Grandfather    Heart attack Paternal Grandfather    Heart disease Paternal Grandfather        He went to sleep and didn't wake up   Hypertension Paternal Grandfather    Allergic rhinitis Daughter    Allergic rhinitis Daughter    Allergic rhinitis Son        milk   Breast cancer Maternal Aunt 60   Stomach cancer Maternal Uncle    Breast cancer Paternal Aunt 58 - 69   Arrhythmia Paternal Aunt        Heart problems   Heart disease Paternal Aunt    Brain cancer Maternal Great-grandmother    Breast cancer Other 77   Breast cancer Other 45   Cancer Other        Face Cancer 51s   Colon cancer Neg Hx    Esophageal cancer Neg Hx    Pancreatic cancer Neg Hx     Past Surgical History:  Procedure Laterality Date   BREAST BIOPSY Right 08/18/2023   US  RT BREAST BX W LOC DEV 1ST LESION IMG BX SPEC US  GUIDE 08/18/2023 [facility] MAMMOGRAPHY   CESAREAN SECTION     CHOLECYSTECTOMY N/A 09/06/2013   Procedure: LAPAROSCOPIC CHOLECYSTECTOMY;  Surgeon: Vicenta DELENA Poli, MD;  Location: [facility];  Service: General;  Laterality: N/A;   EXCISION OF BREAST BIOPSY Left 02/17/2024   Procedure: EXCISIONAL BIOPSY OF LEFT NIPPLE MASS;  Surgeon: Poli Vicenta, MD;  Location: [facility];  Service: General;  Laterality: Left;  LMA EXCISIONAL BIOPSY LEFT NIPPLE MASS   GANGLION CYST EXCISION Right 08/03/2021   Procedure: RIGHT REMOVAL GANGLION OF WRIST;  Surgeon: Romona Harari, MD;  Location: [facility];  Service: Orthopedics;  Laterality: Right;  regional with monitored anesthesia care   GANGLION CYST EXCISION Right 05/26/2022   Procedure: RIGHT WRIST VOLAR GANGLION CYST REMOVAL;  Surgeon: Jerri Kay HERO, MD;  Location: [facility];  Service: Orthopedics;  Laterality: Right;   NASAL SINUS SURGERY  2003   TUBAL LIGATION     Social History   Occupational History   Not on file   Tobacco Use   Smoking status: Never    Passive exposure: Never   Smokeless tobacco: Never  Vaping Use   Vaping status: Never Used  Substance and Sexual Activity   Alcohol use: Yes    Comment: occ.   Drug use: No   Sexual activity: Yes    Birth control/protection: Surgical    Comment: BTL

## 2024-04-25 ENCOUNTER — Other Ambulatory Visit: Payer: Self-pay

## 2024-04-26 ENCOUNTER — Other Ambulatory Visit: Payer: Self-pay

## 2024-04-26 MED ORDER — ATORVASTATIN CALCIUM 20 MG PO TABS
20.0000 mg | ORAL_TABLET | Freq: Every day | ORAL | 3 refills | Status: DC
  Filled 2024-04-26 – 2024-05-21 (×2): qty 90, 90d supply, fill #0

## 2024-04-26 MED ORDER — ESCITALOPRAM OXALATE 10 MG PO TABS
10.0000 mg | ORAL_TABLET | Freq: Every day | ORAL | 2 refills | Status: AC
  Filled 2024-04-26 (×2): qty 30, 30d supply, fill #0

## 2024-04-26 MED ORDER — CARVEDILOL 3.125 MG PO TABS
3.1250 mg | ORAL_TABLET | Freq: Two times a day (BID) | ORAL | 2 refills | Status: AC
  Filled 2024-04-26 – 2024-05-23 (×2): qty 180, 90d supply, fill #0

## 2024-04-26 MED ORDER — IBUPROFEN 600 MG PO TABS
600.0000 mg | ORAL_TABLET | Freq: Three times a day (TID) | ORAL | 1 refills | Status: AC | PRN
  Filled 2024-04-26: qty 30, 10d supply, fill #0

## 2024-05-09 ENCOUNTER — Ambulatory Visit: Payer: Self-pay

## 2024-05-18 NOTE — H&P (View-Only) (Signed)
 "    Patient ID: [name], female    DOB: [birth year], 43 y.o.   MRN: [id number]  Chief Complaint  Patient presents with   Pre-op Exam      ICD-10-CM   1. At risk for breast cancer  Z91.89        History of Present Illness: [name] is a 43 y.o.  female  with a history of benign breast masses.  She presents for preoperative evaluation for upcoming procedure, prophylactic bilateral mastectomy with tissue expander reconstruction, scheduled for 06/05/2024 with Dr. Montorfano and Dr. Vernetta.  The patient has not had problems with anesthesia aside from hard to wake up.  She is accompanied by husband at bedside.  She denies any personal or family history of blood clots or clotting disorder.  She denies any personal history of CVA/MI, nicotine use disorder, varicosities, or cancer.  She will hold her NSAIDs for 3 days prior to surgery and any vitamins/supplements 1 week prior to surgery.  She may continue with her medications for hypertension, hyperlipidemia, and prediabetes.  Discussed post care instructions.  Patient is understanding of the risks and agreeable to proceed.  Summary of Previous Visit: Patient met with Dr. Montorfano 04/12/2024.  At that time, discussed that she is high risk for breast cancer per workup from general surgery.  She was referred for consideration for DIEP flap reconstruction.  Plan is for prophylactic bilateral mastectomy with tissue expander reconstruction.  Afterwards, we will proceed with DIEP flap.  Job: Production Designer, Theatre/television/film at [employer].  Patient will work on getting us  the appropriate FMLA/STD forms from her employer.  PMH Significant for: High risk for breast cancer, HTN, HLD, prediabetes, anxiety.   Past Medical History: Allergies: Allergies[1]  Current Medications: Current Medications[2]  Past Medical Problems: Past Medical History:  Diagnosis Date   Abdominal pain 03/29/2021   Adenoma of left nipple 01/04/2024   0.6 cm hypoechoic mass seen on  mammogram and targeted US  01/03/2024, recommended bilateral breast MRI w/o and w/ contrast for further evaluation indicated.     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   Cystic thyroid  nodule 07/03/2014   Diabetes mellitus without complication (HCC)    Dorsal wrist ganglion 05/20/2021   Eczema    Family history of anesthesia complication    Mother had problem waking up   Family history of brain cancer    Family history of breast cancer    Family history of cervical cancer    Family history of prostate cancer    Family history of stomach cancer    Fatty liver disease, nonalcoholic 09/18/2009   Qualifier: Diagnosis of   By: Edrick MD, Devere         Flank pain 03/29/2021   Ganglion cyst of dorsum of right wrist    Ganglion cyst of volar aspect of right wrist 04/30/2022   Heart palpitations 10/11/2023   Hematochezia 03/08/2023   Hoarseness of voice 05/22/2020   Hx gestational diabetes    Hyperlipidemia 05/20/2021   Hypertension 10/25/2023   Multiple food allergies 10/24/2012   Neuromuscular disorder (HCC)  Obesity, unspecified 07/23/2012   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      09/2016 :   IMPRESSION:  1. No acute intracranial abnormality identified.  2. Diminished 2 mm focus of hypoenhancement within left posterior pituitary may represent scarring or residual microadenoma. No new pituitary mass identified.  3. Otherwise unremarkable MRI of the brain for age.        RUQ pain 02/02/2012   Seasonal allergies 10/24/2012   Spinal headache     Vaginal discharge 01/08/2023   Vision changes 03/01/2021    Past Surgical History: Past Surgical History:  Procedure Laterality Date   BREAST BIOPSY Right 08/18/2023   US  RT BREAST BX W LOC DEV 1ST LESION IMG BX SPEC US  GUIDE 08/18/2023 [facility] M[facility]MO[facility]   CESAREAN SECTION     CHOLECYSTECTOMY N/A 09/06/2013   Procedure: LAPAROSCOPIC CHOLECYSTECTOMY;  Surgeon: Vicenta DELENA Poli, MD;  Location: [facility];  Service: General;  Laterality: N/A;   EXCISION OF BREAST BIOPSY Left 02/17/2024   Procedure: EXCISIONAL BIOPSY OF LEFT NIPPLE MASS;  Surgeon: Poli Vicenta, MD;  Location: [facility];  Service: General;  Laterality: Left;  LMA EXCISIONAL BIOPSY LEFT NIPPLE MASS   GANGLION CYST EXCISION Right 08/03/2021   Procedure: RIGHT REMOVAL GANGLION OF WRIST;  Surgeon: Romona Harari, MD;  Location: [facility];  Service: Orthopedics;  Laterality: Right;  regional with monitored anesthesia care   GANGLION CYST EXCISION Right 05/26/2022   Procedure: RIGHT WRIST VOLAR GANGLION CYST REMOVAL;  Surgeon: Jerri Kay HERO, MD;  Location: [facility];  Service: Orthopedics;  Laterality: Right;   NASAL SINUS SURGERY  2003   TUBAL LIGATION      Social History: Social History   Socioeconomic History   Marital status: Married    Spouse name: Not on file   Number of children: 3   Years of education: Not on file   Highest education level: 12th grade  Occupational History   Not on file  Tobacco Use   Smoking status: Never    Passive exposure: Never   Smokeless tobacco: Never  Vaping Use   Vaping status: Never Used  Substance and Sexual Activity   Alcohol use: Yes    Comment: occ.   Drug use: No   Sexual activity: Yes    Birth control/protection: Surgical    Comment: BTL  Other Topics Concern   Not on file  Social History Narrative   Not on file   Social Drivers of Health   Tobacco Use: Low Risk  (04/10/2024)   Received from [facility]  System   Patient History    Smoking Tobacco Use: Never    Smokeless Tobacco Use: Never    Passive Exposure: Not on file  Financial Resource Strain: Low Risk (01/06/2024)   Overall Financial Resource Strain (CARDIA)    Difficulty of Paying Living Expenses: Not hard at all  Food Insecurity: Food Insecurity Present (02/23/2024)   Epic    Worried About Programme Researcher, Broadcasting/film/video in the Last Year: Sometimes true    Ran Out of Food in the Last Year: Sometimes true  Transportation Needs: No Transportation Needs (02/23/2024)   Epic    Lack of Transportation (Medical): No    Lack of Transportation (Non-Medical): No  Physical Activity: Inactive (01/06/2024)   Exercise Vital Sign    Days of Exercise per Week: 0 days  Minutes of Exercise per Session: Not on file  Stress: Stress Concern Present (01/06/2024)   [employer] of Occupational Health - Occupational Stress Questionnaire    Feeling of Stress: Rather much  Social Connections: Moderately Integrated (01/06/2024)   Social Connection and Isolation Panel    Frequency of Communication with Friends and Family: Twice a week    Frequency of Social Gatherings with Friends and Family: Once a week    Attends Religious Services: More than 4 times per year    Active Member of [employer] or Organizations: No    Attends Engineer, Structural: Not on file    Marital Status: Married  Catering Manager Violence: Not on file  Depression (PHQ2-9): Medium Risk (01/06/2024)   Depression (PHQ2-9)    PHQ-2 Score: 8  Alcohol Screen: Low Risk (01/06/2024)   Alcohol Screen    Last Alcohol Screening Score (AUDIT): 3  Housing: Low Risk (01/06/2024)   Epic    Unable to Pay for Housing in the Last Year: No    Number of Times Moved in the Last Year: 0    Homeless in the Last Year: No  Utilities: Not on file  Health Literacy: Not on file    Family History: Family History  Problem Relation Age of Onset   Hypertension Mother    Hyperlipidemia Mother    Cervical  cancer Mother 60   Anemia Mother        Due to cancer   Breast cancer Mother 13   Diabetes Father    Hypertension Father    Irregular heart beat Father    Arrhythmia Father        Due to blood pressure   Heart attack Father        He had a mild episode   Heart disease Father    Anemia Sister    Asthma Sister    Other Sister        Pituitary Mass   Arrhythmia Brother    Asthma Brother        Since child   Other Brother        Brain Tumor   Seizures Brother    Throat cancer Maternal Grandmother    Arrhythmia Maternal Grandmother    Heart attack Maternal Grandmother    Heart disease Maternal Grandmother    Hypertension Maternal Grandmother    Prostate cancer Maternal Grandfather 50   Arrhythmia Maternal Grandfather    Hypertension Maternal Grandfather    Arrhythmia Paternal Grandmother    Hypertension Paternal Grandmother    Arrhythmia Paternal Grandfather    Heart attack Paternal Grandfather    Heart disease Paternal Grandfather        He went to sleep and didnt wake up   Hypertension Paternal Grandfather    Allergic rhinitis Daughter    Allergic rhinitis Daughter    Allergic rhinitis Son        milk   Breast cancer Maternal Aunt 60   Stomach cancer Maternal Uncle    Breast cancer Paternal Aunt 59 - 69   Arrhythmia Paternal Aunt        Heart problems   Heart disease Paternal Aunt    Brain cancer Maternal Great-grandmother    Breast cancer Other 45   Breast cancer Other 45   Cancer Other        Face Cancer 50s   Colon cancer Neg Hx    Esophageal cancer Neg Hx    Pancreatic cancer Neg Hx  Review of Systems: ROS Denies any recent chest pain, difficulty breathing, leg swelling, or fevers.  Physical Exam: Vital Signs BP (!) 144/81   Pulse 70   Temp 98.4 F (36.9 C)   Ht 5' 3 (1.6 m)   Wt 176 lb 6.4 oz (80 kg)   SpO2 100%   BMI 31.25 kg/m   Physical Exam Constitutional:      General: Not in acute distress.    Appearance: Normal appearance. Not  ill-appearing.  HENT:     Head: Normocephalic and atraumatic.  Eyes:     Pupils: Pupils are equal, round. Cardiovascular:     Rate and Rhythm: Normal rate.    Pulses: Normal pulses.  Pulmonary:     Effort: No respiratory distress or increased work of breathing.  Speaks in full sentences. Abdominal:     General: Abdomen is flat. No distension.   Musculoskeletal: Normal range of motion. No lower extremity swelling or edema. No varicosities. Skin:    General: Skin is warm and dry.     Findings: No erythema or rash.  Neurological:     Mental Status: Alert and oriented to person, place, and time.  Psychiatric:        Mood and Affect: Mood normal.        Behavior: Behavior normal.    Assessment/Plan: The patient is scheduled for prophylactic bilateral mastectomy with tissue expander reconstruction with Dr. Montorfano.  Risks, benefits, and alternatives of procedure discussed, questions answered and consent obtained.    Smoking Status: Non-smoker.  Last Mammogram: 01/03/2024; Results: BI-RADS Category 3: Probably benign.  Caprini Score: 4; Risk Factors include: Age, BMI greater than 25, and length of planned surgery. Recommendation for mechanical prophylaxis. Encourage early ambulation.   Pictures obtained: 04/04/2024  Post-op Rx sent to pharmacy: Oxycodone , Bactrim , Zofran , gabapentin, and Robaxin .  Discussed the combined sedative effects of these medications.  She understands to take only as needed and with caution.  Ibuprofen  and Tylenol  as primary means for pain control postoperatively.  Patient was provided with the General Surgical Risk consent document and Pain Medication Agreement prior to their appointment.  They had adequate time to read through the risk consent documents and Pain Medication Agreement. We also discussed them in person together during this preop appointment. All of their questions were answered to their satisfaction.  Recommended calling if they have any further  questions.  Risk consent form and Pain Medication Agreement to be scanned into patient's chart.  The risks that can be encountered with and after placement of a breast expander placement were discussed and include the following but not limited to these: bleeding, infection, delayed healing, anesthesia risks, skin sensation changes, injury to structures including nerves, blood vessels, and muscles which may be temporary or permanent, allergies to tape, suture materials and glues, blood products, topical preparations or injected agents, skin contour irregularities, skin discoloration and swelling, deep vein thrombosis, cardiac and pulmonary complications, pain, which may persist, fluid accumulation, wrinkling of the skin over the expander, changes in nipple or breast sensation, expander leakage or rupture, faulty position of the expander, persistent pain, formation of tight scar tissue around the expander (capsular contracture), possible need for revisional surgery or staged procedures.     Electronically signed by: Honora Seip, PA-C 05/21/2024 9:23 AM     [1]  Allergies Allergen Reactions   Amoxicillin  Hives and Swelling   Ibuprofen  Hives   Penicillins Hives and Swelling    Pt received Ceftriaxone  in 2014 without rxn. Allergy  updated by S. Tanda, PharmD (05/25/22)   Shellfish Allergy Hives  [2]  Current Outpatient Medications:    atorvastatin  (LIPITOR) 20 MG tablet, Take 1 tablet (20 mg total) by mouth daily., Disp: 90 tablet, Rfl: 3   atorvastatin  (LIPITOR) 20 MG tablet, Take 1 tablet (20 mg total) by mouth daily., Disp: 90 tablet, Rfl: 3   carvedilol  (COREG ) 3.125 MG tablet, Take 1 tablet (3.125 mg total) by mouth 2 (two) times daily., Disp: 180 tablet, Rfl: 3   carvedilol  (COREG ) 3.125 MG tablet, Take 1 tablet (3.125 mg total) by mouth 2 (two) times daily., Disp: 180 tablet, Rfl: 2   cetirizine  (ZYRTEC ) 10 MG tablet, Take 1 tablet (10 mg total) by mouth daily., Disp: 30 tablet, Rfl: 11    escitalopram  (LEXAPRO ) 10 MG tablet, Take 1 tablet (10 mg total) by mouth daily., Disp: 30 tablet, Rfl: 2   fluticasone  (FLONASE ) 50 MCG/ACT nasal spray, Place 2 sprays into both nostrils daily., Disp: 16 g, Rfl: 5   ibuprofen  (ADVIL ) 600 MG tablet, Take 1 tablet (600 mg total) by mouth every 8 (eight) hours as needed., Disp: 30 tablet, Rfl: 1   meloxicam  (MOBIC ) 15 MG tablet, Take 1 tablet (15 mg total) by mouth daily., Disp: 14 tablet, Rfl: 2   metFORMIN  (GLUCOPHAGE -XR) 500 MG 24 hr tablet, TAKE 2 TABLETS BY MOUTH ONCE DAILY WITH BREAKFAST, Disp: 90 tablet, Rfl: 0   methylPREDNISolone  (MEDROL  DOSEPAK) 4 MG TBPK tablet, Take as directed, Disp: 21 tablet, Rfl: 0  "

## 2024-05-18 NOTE — Progress Notes (Signed)
 "    Patient ID: [name], female    DOB: [birth year], 43 y.o.   MRN: [id number]  Chief Complaint  Patient presents with   Pre-op Exam      ICD-10-CM   1. At risk for breast cancer  Z91.89        History of Present Illness: [name] is a 43 y.o.  female  with a history of benign breast masses.  She presents for preoperative evaluation for upcoming procedure, prophylactic bilateral mastectomy with tissue expander reconstruction, scheduled for 06/05/2024 with Dr. Montorfano and Dr. Vernetta.  The patient has not had problems with anesthesia aside from hard to wake up.  She is accompanied by husband at bedside.  She denies any personal or family history of blood clots or clotting disorder.  She denies any personal history of CVA/MI, nicotine use disorder, varicosities, or cancer.  She will hold her NSAIDs for 3 days prior to surgery and any vitamins/supplements 1 week prior to surgery.  She may continue with her medications for hypertension, hyperlipidemia, and prediabetes.  Discussed post care instructions.  Patient is understanding of the risks and agreeable to proceed.  Summary of Previous Visit: Patient met with Dr. Montorfano 04/12/2024.  At that time, discussed that she is high risk for breast cancer per workup from general surgery.  She was referred for consideration for DIEP flap reconstruction.  Plan is for prophylactic bilateral mastectomy with tissue expander reconstruction.  Afterwards, we will proceed with DIEP flap.  Job: Production Designer, Theatre/television/film at [employer].  Patient will work on getting us  the appropriate FMLA/STD forms from her employer.  PMH Significant for: High risk for breast cancer, HTN, HLD, prediabetes, anxiety.   Past Medical History: Allergies: Allergies[1]  Current Medications: Current Medications[2]  Past Medical Problems: Past Medical History:  Diagnosis Date   Abdominal pain 03/29/2021   Adenoma of left nipple 01/04/2024   0.6 cm hypoechoic mass seen on  mammogram and targeted US  01/03/2024, recommended bilateral breast MRI w/o and w/ contrast for further evaluation indicated.     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   Cystic thyroid  nodule 07/03/2014   Diabetes mellitus without complication (HCC)    Dorsal wrist ganglion 05/20/2021   Eczema    Family history of anesthesia complication    Mother had problem waking up   Family history of brain cancer    Family history of breast cancer    Family history of cervical cancer    Family history of prostate cancer    Family history of stomach cancer    Fatty liver disease, nonalcoholic 09/18/2009   Qualifier: Diagnosis of   By: Edrick MD, Devere         Flank pain 03/29/2021   Ganglion cyst of dorsum of right wrist    Ganglion cyst of volar aspect of right wrist 04/30/2022   Heart palpitations 10/11/2023   Hematochezia 03/08/2023   Hoarseness of voice 05/22/2020   Hx gestational diabetes    Hyperlipidemia 05/20/2021   Hypertension 10/25/2023   Multiple food allergies 10/24/2012   Neuromuscular disorder (HCC)  Obesity, unspecified 07/23/2012   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      09/2016 :   IMPRESSION:  1. No acute intracranial abnormality identified.  2. Diminished 2 mm focus of hypoenhancement within left posterior pituitary may represent scarring or residual microadenoma. No new pituitary mass identified.  3. Otherwise unremarkable MRI of the brain for age.        RUQ pain 02/02/2012   Seasonal allergies 10/24/2012   Spinal headache     Vaginal discharge 01/08/2023   Vision changes 03/01/2021    Past Surgical History: Past Surgical History:  Procedure Laterality Date   BREAST BIOPSY Right 08/18/2023   US  RT BREAST BX W LOC DEV 1ST LESION IMG BX SPEC US  GUIDE 08/18/2023 [facility]   CESAREAN SECTION     CHOLECYSTECTOMY N/A 09/06/2013   Procedure: LAPAROSCOPIC CHOLECYSTECTOMY;  Surgeon: Vicenta DELENA Poli, MD;  Location: [facility];  Service: General;  Laterality: N/A;   EXCISION OF BREAST BIOPSY Left 02/17/2024   Procedure: EXCISIONAL BIOPSY OF LEFT NIPPLE MASS;  Surgeon: Poli Vicenta, MD;  Location: [facility];  Service: General;  Laterality: Left;  LMA EXCISIONAL BIOPSY LEFT NIPPLE MASS   GANGLION CYST EXCISION Right 08/03/2021   Procedure: RIGHT REMOVAL GANGLION OF WRIST;  Surgeon: Romona Harari, MD;  Location: [facility];  Service: Orthopedics;  Laterality: Right;  regional with monitored anesthesia care   GANGLION CYST EXCISION Right 05/26/2022   Procedure: RIGHT WRIST VOLAR GANGLION CYST REMOVAL;  Surgeon: Jerri Kay HERO, MD;  Location: [facility];  Service: Orthopedics;  Laterality: Right;   NASAL SINUS SURGERY  2003   TUBAL LIGATION      Social History: Social History   Socioeconomic History   Marital status: Married    Spouse name: Not on file   Number of children: 3   Years of education: Not on file   Highest education level: 12th grade  Occupational History   Not on file  Tobacco Use   Smoking status: Never    Passive exposure: Never   Smokeless tobacco: Never  Vaping Use   Vaping status: Never Used  Substance and Sexual Activity   Alcohol use: Yes    Comment: occ.   Drug use: No   Sexual activity: Yes    Birth control/protection: Surgical    Comment: BTL  Other Topics Concern   Not on file  Social History Narrative   Not on file   Social Drivers of Health   Tobacco Use: Low Risk  (04/10/2024)   Received from [facility]  System   Patient History    Smoking Tobacco Use: Never    Smokeless Tobacco Use: Never    Passive Exposure: Not on file  Financial Resource Strain: Low Risk (01/06/2024)   Overall Financial Resource Strain (CARDIA)    Difficulty of Paying Living Expenses: Not hard at all  Food Insecurity: Food Insecurity Present (02/23/2024)   Epic    Worried About Programme Researcher, Broadcasting/film/video in the Last Year: Sometimes true    Ran Out of Food in the Last Year: Sometimes true  Transportation Needs: No Transportation Needs (02/23/2024)   Epic    Lack of Transportation (Medical): No    Lack of Transportation (Non-Medical): No  Physical Activity: Inactive (01/06/2024)   Exercise Vital Sign    Days of Exercise per Week: 0 days  Minutes of Exercise per Session: Not on file  Stress: Stress Concern Present (01/06/2024)   [employer] of Occupational Health - Occupational Stress Questionnaire    Feeling of Stress: Rather much  Social Connections: Moderately Integrated (01/06/2024)   Social Connection and Isolation Panel    Frequency of Communication with Friends and Family: Twice a week    Frequency of Social Gatherings with Friends and Family: Once a week    Attends Religious Services: More than 4 times per year    Active Member of [employer] or Organizations: No    Attends Engineer, Structural: Not on file    Marital Status: Married  Catering Manager Violence: Not on file  Depression (PHQ2-9): Medium Risk (01/06/2024)   Depression (PHQ2-9)    PHQ-2 Score: 8  Alcohol Screen: Low Risk (01/06/2024)   Alcohol Screen    Last Alcohol Screening Score (AUDIT): 3  Housing: Low Risk (01/06/2024)   Epic    Unable to Pay for Housing in the Last Year: No    Number of Times Moved in the Last Year: 0    Homeless in the Last Year: No  Utilities: Not on file  Health Literacy: Not on file    Family History: Family History  Problem Relation Age of Onset   Hypertension Mother    Hyperlipidemia Mother    Cervical  cancer Mother 60   Anemia Mother        Due to cancer   Breast cancer Mother 13   Diabetes Father    Hypertension Father    Irregular heart beat Father    Arrhythmia Father        Due to blood pressure   Heart attack Father        He had a mild episode   Heart disease Father    Anemia Sister    Asthma Sister    Other Sister        Pituitary Mass   Arrhythmia Brother    Asthma Brother        Since child   Other Brother        Brain Tumor   Seizures Brother    Throat cancer Maternal Grandmother    Arrhythmia Maternal Grandmother    Heart attack Maternal Grandmother    Heart disease Maternal Grandmother    Hypertension Maternal Grandmother    Prostate cancer Maternal Grandfather 50   Arrhythmia Maternal Grandfather    Hypertension Maternal Grandfather    Arrhythmia Paternal Grandmother    Hypertension Paternal Grandmother    Arrhythmia Paternal Grandfather    Heart attack Paternal Grandfather    Heart disease Paternal Grandfather        He went to sleep and didnt wake up   Hypertension Paternal Grandfather    Allergic rhinitis Daughter    Allergic rhinitis Daughter    Allergic rhinitis Son        milk   Breast cancer Maternal Aunt 60   Stomach cancer Maternal Uncle    Breast cancer Paternal Aunt 59 - 69   Arrhythmia Paternal Aunt        Heart problems   Heart disease Paternal Aunt    Brain cancer Maternal Great-grandmother    Breast cancer Other 45   Breast cancer Other 45   Cancer Other        Face Cancer 50s   Colon cancer Neg Hx    Esophageal cancer Neg Hx    Pancreatic cancer Neg Hx  Review of Systems: ROS Denies any recent chest pain, difficulty breathing, leg swelling, or fevers.  Physical Exam: Vital Signs BP (!) 144/81   Pulse 70   Temp 98.4 F (36.9 C)   Ht 5' 3 (1.6 m)   Wt 176 lb 6.4 oz (80 kg)   SpO2 100%   BMI 31.25 kg/m   Physical Exam Constitutional:      General: Not in acute distress.    Appearance: Normal appearance. Not  ill-appearing.  HENT:     Head: Normocephalic and atraumatic.  Eyes:     Pupils: Pupils are equal, round. Cardiovascular:     Rate and Rhythm: Normal rate.    Pulses: Normal pulses.  Pulmonary:     Effort: No respiratory distress or increased work of breathing.  Speaks in full sentences. Abdominal:     General: Abdomen is flat. No distension.   Musculoskeletal: Normal range of motion. No lower extremity swelling or edema. No varicosities. Skin:    General: Skin is warm and dry.     Findings: No erythema or rash.  Neurological:     Mental Status: Alert and oriented to person, place, and time.  Psychiatric:        Mood and Affect: Mood normal.        Behavior: Behavior normal.    Assessment/Plan: The patient is scheduled for prophylactic bilateral mastectomy with tissue expander reconstruction with Dr. Montorfano.  Risks, benefits, and alternatives of procedure discussed, questions answered and consent obtained.    Smoking Status: Non-smoker.  Last Mammogram: 01/03/2024; Results: BI-RADS Category 3: Probably benign.  Caprini Score: 4; Risk Factors include: Age, BMI greater than 25, and length of planned surgery. Recommendation for mechanical prophylaxis. Encourage early ambulation.   Pictures obtained: 04/04/2024  Post-op Rx sent to pharmacy: Oxycodone , Bactrim , Zofran , gabapentin, and Robaxin .  Discussed the combined sedative effects of these medications.  She understands to take only as needed and with caution.  Ibuprofen  and Tylenol  as primary means for pain control postoperatively.  Patient was provided with the General Surgical Risk consent document and Pain Medication Agreement prior to their appointment.  They had adequate time to read through the risk consent documents and Pain Medication Agreement. We also discussed them in person together during this preop appointment. All of their questions were answered to their satisfaction.  Recommended calling if they have any further  questions.  Risk consent form and Pain Medication Agreement to be scanned into patient's chart.  The risks that can be encountered with and after placement of a breast expander placement were discussed and include the following but not limited to these: bleeding, infection, delayed healing, anesthesia risks, skin sensation changes, injury to structures including nerves, blood vessels, and muscles which may be temporary or permanent, allergies to tape, suture materials and glues, blood products, topical preparations or injected agents, skin contour irregularities, skin discoloration and swelling, deep vein thrombosis, cardiac and pulmonary complications, pain, which may persist, fluid accumulation, wrinkling of the skin over the expander, changes in nipple or breast sensation, expander leakage or rupture, faulty position of the expander, persistent pain, formation of tight scar tissue around the expander (capsular contracture), possible need for revisional surgery or staged procedures.     Electronically signed by: Honora Seip, PA-C 05/21/2024 9:23 AM     [1]  Allergies Allergen Reactions   Amoxicillin  Hives and Swelling   Ibuprofen  Hives   Penicillins Hives and Swelling    Pt received Ceftriaxone  in 2014 without rxn. Allergy  updated by S. Tanda, PharmD (05/25/22)   Shellfish Allergy Hives  [2]  Current Outpatient Medications:    atorvastatin  (LIPITOR) 20 MG tablet, Take 1 tablet (20 mg total) by mouth daily., Disp: 90 tablet, Rfl: 3   atorvastatin  (LIPITOR) 20 MG tablet, Take 1 tablet (20 mg total) by mouth daily., Disp: 90 tablet, Rfl: 3   carvedilol  (COREG ) 3.125 MG tablet, Take 1 tablet (3.125 mg total) by mouth 2 (two) times daily., Disp: 180 tablet, Rfl: 3   carvedilol  (COREG ) 3.125 MG tablet, Take 1 tablet (3.125 mg total) by mouth 2 (two) times daily., Disp: 180 tablet, Rfl: 2   cetirizine  (ZYRTEC ) 10 MG tablet, Take 1 tablet (10 mg total) by mouth daily., Disp: 30 tablet, Rfl: 11    escitalopram  (LEXAPRO ) 10 MG tablet, Take 1 tablet (10 mg total) by mouth daily., Disp: 30 tablet, Rfl: 2   fluticasone  (FLONASE ) 50 MCG/ACT nasal spray, Place 2 sprays into both nostrils daily., Disp: 16 g, Rfl: 5   ibuprofen  (ADVIL ) 600 MG tablet, Take 1 tablet (600 mg total) by mouth every 8 (eight) hours as needed., Disp: 30 tablet, Rfl: 1   meloxicam  (MOBIC ) 15 MG tablet, Take 1 tablet (15 mg total) by mouth daily., Disp: 14 tablet, Rfl: 2   metFORMIN  (GLUCOPHAGE -XR) 500 MG 24 hr tablet, TAKE 2 TABLETS BY MOUTH ONCE DAILY WITH BREAKFAST, Disp: 90 tablet, Rfl: 0   methylPREDNISolone  (MEDROL  DOSEPAK) 4 MG TBPK tablet, Take as directed, Disp: 21 tablet, Rfl: 0  "

## 2024-05-21 ENCOUNTER — Ambulatory Visit (INDEPENDENT_AMBULATORY_CARE_PROVIDER_SITE_OTHER): Payer: Self-pay | Admitting: Physician Assistant

## 2024-05-21 ENCOUNTER — Other Ambulatory Visit: Payer: Self-pay

## 2024-05-21 VITALS — BP 144/81 | HR 70 | Temp 98.4°F | Ht 63.0 in | Wt 176.4 lb

## 2024-05-21 DIAGNOSIS — Z803 Family history of malignant neoplasm of breast: Secondary | ICD-10-CM

## 2024-05-21 DIAGNOSIS — N6459 Other signs and symptoms in breast: Secondary | ICD-10-CM

## 2024-05-21 DIAGNOSIS — Z719 Counseling, unspecified: Secondary | ICD-10-CM

## 2024-05-21 DIAGNOSIS — Z9189 Other specified personal risk factors, not elsewhere classified: Secondary | ICD-10-CM

## 2024-05-21 MED ORDER — GABAPENTIN 300 MG PO CAPS
300.0000 mg | ORAL_CAPSULE | Freq: Three times a day (TID) | ORAL | 0 refills | Status: AC | PRN
  Filled 2024-05-21: qty 42, 14d supply, fill #0

## 2024-05-21 MED ORDER — SULFAMETHOXAZOLE-TRIMETHOPRIM 800-160 MG PO TABS
1.0000 | ORAL_TABLET | Freq: Two times a day (BID) | ORAL | 0 refills | Status: AC
  Filled 2024-05-21: qty 28, 14d supply, fill #0

## 2024-05-21 MED ORDER — OXYCODONE HCL 5 MG PO TABS
5.0000 mg | ORAL_TABLET | Freq: Three times a day (TID) | ORAL | 0 refills | Status: AC | PRN
  Filled 2024-05-21: qty 15, 5d supply, fill #0

## 2024-05-21 MED ORDER — ONDANSETRON 4 MG PO TBDP
4.0000 mg | ORAL_TABLET | Freq: Three times a day (TID) | ORAL | 0 refills | Status: DC | PRN
  Filled 2024-05-21: qty 20, 7d supply, fill #0

## 2024-05-21 MED ORDER — METHOCARBAMOL 500 MG PO TABS
500.0000 mg | ORAL_TABLET | Freq: Two times a day (BID) | ORAL | 0 refills | Status: AC | PRN
  Filled 2024-05-21: qty 20, 10d supply, fill #0

## 2024-05-23 ENCOUNTER — Other Ambulatory Visit: Payer: Self-pay

## 2024-05-28 ENCOUNTER — Other Ambulatory Visit: Payer: Self-pay

## 2024-05-28 ENCOUNTER — Encounter (HOSPITAL_BASED_OUTPATIENT_CLINIC_OR_DEPARTMENT_OTHER): Payer: Self-pay | Admitting: Surgery

## 2024-05-29 ENCOUNTER — Encounter (HOSPITAL_BASED_OUTPATIENT_CLINIC_OR_DEPARTMENT_OTHER)
Admission: RE | Admit: 2024-05-29 | Discharge: 2024-05-29 | Disposition: A | Discharge status: Home / Self Care | Payer: Self-pay | Source: Ambulatory Visit | Attending: Surgery | Admitting: Surgery

## 2024-05-29 DIAGNOSIS — Z01818 Encounter for other preprocedural examination: Secondary | ICD-10-CM | POA: Insufficient documentation

## 2024-05-29 LAB — BASIC METABOLIC PANEL WITH GFR
Anion gap: 11 (ref 5–15)
BUN: 12 mg/dL (ref 6–20)
CO2: 23 mmol/L (ref 22–32)
Calcium: 9.1 mg/dL (ref 8.9–10.3)
Chloride: 100 mmol/L (ref 98–111)
Creatinine, Ser: 0.7 mg/dL (ref 0.44–1.00)
GFR, Estimated: >60 mL/min
Glucose, Bld: 218 mg/dL — ABNORMAL HIGH (ref 70–99)
Potassium: 4.4 mmol/L (ref 3.5–5.1)
Sodium: 134 mmol/L — ABNORMAL LOW (ref 135–145)

## 2024-05-29 MED ORDER — CHLORHEXIDINE GLUCONATE CLOTH 2 % EX PADS: 6.0000 | MEDICATED_PAD | Freq: Once | CUTANEOUS | Status: DC

## 2024-05-29 MED ORDER — ENSURE PRE-SURGERY PO LIQD: 296.0000 mL | Freq: Once | ORAL | Status: DC

## 2024-05-29 NOTE — Progress Notes (Signed)

## 2024-06-04 ENCOUNTER — Other Ambulatory Visit: Payer: Self-pay

## 2024-06-04 NOTE — Anesthesia Preprocedure Evaluation (Signed)
"                                    Anesthesia Evaluation  Patient identified by MRN, date of birth, ID band Patient awake    Reviewed: Allergy & Precautions, NPO status , Patient's Chart, lab work & pertinent test results, reviewed documented beta blocker date and time   History of Anesthesia Complications Negative for: history of anesthetic complications  Airway Mallampati: II  TM Distance: >3 FB Neck ROM: Full    Dental no notable dental hx.    Pulmonary neg pulmonary ROS   Pulmonary exam normal        Cardiovascular hypertension, Pt. on medications and Pt. on home beta blockers Normal cardiovascular exam     Neuro/Psych  Headaches  Anxiety        GI/Hepatic negative GI ROS, Neg liver ROS,,,  Endo/Other  diabetes, Type 2, Oral Hypoglycemic Agents    Renal/GU negative Renal ROS     Musculoskeletal  (+) Arthritis ,    Abdominal   Peds  Hematology negative hematology ROS (+)   Anesthesia Other Findings HIGH RISK OF BREAST CANCER  Reproductive/Obstetrics                              Anesthesia Physical Anesthesia Plan  ASA: 2  Anesthesia Plan: General   Post-op Pain Management: Tylenol  PO (pre-op)*, Precedex  and Dilaudid  IV   Induction: Intravenous  PONV Risk Score and Plan: 3 and Treatment may vary due to age or medical condition, Ondansetron , Dexamethasone , Midazolam , Scopolamine  patch - Pre-op and Propofol  infusion  Airway Management Planned: Oral ETT  Additional Equipment: None  Intra-op Plan:   Post-operative Plan: Extubation in OR  Informed Consent: I have reviewed the patients History and Physical, chart, labs and discussed the procedure including the risks, benefits and alternatives for the proposed anesthesia with the patient or authorized representative who has indicated his/her understanding and acceptance.     Dental advisory given  Plan Discussed with: CRNA  Anesthesia Plan Comments:           Anesthesia Quick Evaluation  "

## 2024-06-05 ENCOUNTER — Ambulatory Visit (HOSPITAL_BASED_OUTPATIENT_CLINIC_OR_DEPARTMENT_OTHER): Payer: Self-pay | Admitting: Anesthesiology

## 2024-06-05 ENCOUNTER — Encounter (HOSPITAL_BASED_OUTPATIENT_CLINIC_OR_DEPARTMENT_OTHER): Payer: Self-pay | Admitting: Anesthesiology

## 2024-06-05 ENCOUNTER — Encounter (HOSPITAL_BASED_OUTPATIENT_CLINIC_OR_DEPARTMENT_OTHER): Payer: Self-pay | Admitting: Surgery

## 2024-06-05 ENCOUNTER — Encounter (HOSPITAL_BASED_OUTPATIENT_CLINIC_OR_DEPARTMENT_OTHER): Admission: RE | Disposition: A | Payer: Self-pay | Source: Home / Self Care | Attending: Surgery

## 2024-06-05 ENCOUNTER — Other Ambulatory Visit: Payer: Self-pay

## 2024-06-05 ENCOUNTER — Observation Stay (HOSPITAL_BASED_OUTPATIENT_CLINIC_OR_DEPARTMENT_OTHER)
Admission: RE | Admit: 2024-06-05 | Discharge: 2024-06-06 | Disposition: A | Discharge status: Home / Self Care | Payer: Self-pay | Attending: Plastic Surgery | Admitting: Plastic Surgery

## 2024-06-05 DIAGNOSIS — N6081 Other benign mammary dysplasias of right breast: Secondary | ICD-10-CM | POA: Insufficient documentation

## 2024-06-05 DIAGNOSIS — Z1501 Genetic susceptibility to malignant neoplasm of breast: Secondary | ICD-10-CM

## 2024-06-05 DIAGNOSIS — N6489 Other specified disorders of breast: Secondary | ICD-10-CM

## 2024-06-05 DIAGNOSIS — Z421 Encounter for breast reconstruction following mastectomy: Secondary | ICD-10-CM

## 2024-06-05 DIAGNOSIS — Z7984 Long term (current) use of oral hypoglycemic drugs: Secondary | ICD-10-CM | POA: Insufficient documentation

## 2024-06-05 DIAGNOSIS — I10 Essential (primary) hypertension: Secondary | ICD-10-CM | POA: Insufficient documentation

## 2024-06-05 DIAGNOSIS — N62 Hypertrophy of breast: Secondary | ICD-10-CM | POA: Insufficient documentation

## 2024-06-05 DIAGNOSIS — D241 Benign neoplasm of right breast: Secondary | ICD-10-CM | POA: Insufficient documentation

## 2024-06-05 DIAGNOSIS — Z4001 Encounter for prophylactic removal of breast: Principal | ICD-10-CM | POA: Insufficient documentation

## 2024-06-05 DIAGNOSIS — Z01818 Encounter for other preprocedural examination: Secondary | ICD-10-CM

## 2024-06-05 DIAGNOSIS — C50919 Malignant neoplasm of unspecified site of unspecified female breast: Secondary | ICD-10-CM | POA: Diagnosis present

## 2024-06-05 DIAGNOSIS — E119 Type 2 diabetes mellitus without complications: Principal | ICD-10-CM | POA: Insufficient documentation

## 2024-06-05 HISTORY — PX: BREAST RECONSTRUCTION: SHX9

## 2024-06-05 HISTORY — PX: TOTAL MASTECTOMY: SHX6129

## 2024-06-05 LAB — POCT PREGNANCY, URINE: Preg Test, Ur: NEGATIVE

## 2024-06-05 LAB — GLUCOSE, CAPILLARY
Glucose-Capillary: 111 mg/dL — ABNORMAL HIGH (ref 70–99)
Glucose-Capillary: 125 mg/dL — ABNORMAL HIGH (ref 70–99)

## 2024-06-05 MED ORDER — ACETAMINOPHEN 500 MG PO TABS: 1000.0000 mg | ORAL_TABLET | Freq: Once | ORAL | Status: AC

## 2024-06-05 MED ORDER — FENTANYL CITRATE (PF) 100 MCG/2ML IJ SOLN
INTRAMUSCULAR | Status: AC
  Filled 2024-06-05: qty 2

## 2024-06-05 MED ORDER — LACTATED RINGERS IV SOLN: INTRAVENOUS | Status: AC

## 2024-06-05 MED ORDER — DEXMEDETOMIDINE HCL IN NACL 80 MCG/20ML IV SOLN
INTRAVENOUS | Status: AC
  Filled 2024-06-05: qty 20

## 2024-06-05 MED ORDER — SULFAMETHOXAZOLE-TRIMETHOPRIM 800-160 MG PO TABS
1.0000 | ORAL_TABLET | Freq: Once | ORAL | Status: AC
  Administered 2024-06-06: 1 via ORAL
  Filled 2024-06-05: qty 1

## 2024-06-05 MED ORDER — SUGAMMADEX SODIUM 200 MG/2ML IV SOLN
INTRAVENOUS | Status: DC | PRN
  Administered 2024-06-05: 200 mg via INTRAVENOUS

## 2024-06-05 MED ORDER — PHENYLEPHRINE HCL (PRESSORS) 10 MG/ML IV SOLN
INTRAVENOUS | Status: DC | PRN
  Administered 2024-06-05 (×2): 80 ug via INTRAVENOUS

## 2024-06-05 MED ORDER — MIDAZOLAM HCL 2 MG/2ML IJ SOLN
INTRAMUSCULAR | Status: AC
  Filled 2024-06-05: qty 2

## 2024-06-05 MED ORDER — LIDOCAINE HCL (CARDIAC) PF 100 MG/5ML IV SOSY
PREFILLED_SYRINGE | INTRAVENOUS | Status: DC | PRN
  Administered 2024-06-05: 100 mg via INTRAVENOUS

## 2024-06-05 MED ORDER — BUPIVACAINE HCL (PF) 0.25 % IJ SOLN
INTRAMUSCULAR | Status: AC
  Filled 2024-06-05: qty 30

## 2024-06-05 MED ORDER — ONDANSETRON 4 MG PO TBDP: 4.0000 mg | ORAL_TABLET | Freq: Four times a day (QID) | ORAL | Status: DC | PRN

## 2024-06-05 MED ORDER — BUPIVACAINE LIPOSOME 1.3 % IJ SUSP
INTRAMUSCULAR | Status: AC
  Filled 2024-06-05: qty 20

## 2024-06-05 MED ORDER — SUCCINYLCHOLINE CHLORIDE 200 MG/10ML IV SOSY
PREFILLED_SYRINGE | INTRAVENOUS | Status: AC
  Filled 2024-06-05: qty 10

## 2024-06-05 MED ORDER — 0.9 % SODIUM CHLORIDE (POUR BTL) OPTIME
TOPICAL | Status: DC | PRN
  Administered 2024-06-05 (×2): 1000 mL

## 2024-06-05 MED ORDER — ACETAMINOPHEN 325 MG PO TABS
325.0000 mg | ORAL_TABLET | Freq: Four times a day (QID) | ORAL | Status: DC | PRN
  Administered 2024-06-05: 325 mg via ORAL
  Filled 2024-06-05 (×2): qty 1

## 2024-06-05 MED ORDER — PROPOFOL 500 MG/50ML IV EMUL
INTRAVENOUS | Status: DC | PRN
  Administered 2024-06-05: 50 ug/kg/min via INTRAVENOUS

## 2024-06-05 MED ORDER — OXYCODONE HCL 5 MG PO TABS
5.0000 mg | ORAL_TABLET | Freq: Four times a day (QID) | ORAL | Status: DC | PRN
  Administered 2024-06-05 – 2024-06-06 (×3): 5 mg via ORAL
  Filled 2024-06-05 (×3): qty 1

## 2024-06-05 MED ORDER — CHLORHEXIDINE GLUCONATE CLOTH 2 % EX PADS: 6.0000 | MEDICATED_PAD | Freq: Once | CUTANEOUS | Status: DC

## 2024-06-05 MED ORDER — SODIUM CHLORIDE 0.9 % IV SOLN
INTRAVENOUS | Status: DC | PRN
  Administered 2024-06-05: 80 mg

## 2024-06-05 MED ORDER — CIPROFLOXACIN IN D5W 400 MG/200ML IV SOLN
INTRAVENOUS | Status: AC
  Filled 2024-06-05: qty 200

## 2024-06-05 MED ORDER — LIDOCAINE 2% (20 MG/ML) 5 ML SYRINGE
INTRAMUSCULAR | Status: AC
  Filled 2024-06-05: qty 5

## 2024-06-05 MED ORDER — SCOPOLAMINE 1 MG/3DAYS TD PT72
1.0000 | MEDICATED_PATCH | TRANSDERMAL | Status: DC
  Administered 2024-06-05: 1 mg via TRANSDERMAL

## 2024-06-05 MED ORDER — ACETAMINOPHEN 500 MG PO TABS
ORAL_TABLET | ORAL | Status: AC
  Filled 2024-06-05: qty 2

## 2024-06-05 MED ORDER — CIPROFLOXACIN IN D5W 400 MG/200ML IV SOLN
400.0000 mg | INTRAVENOUS | Status: AC
  Administered 2024-06-05: 400 mg via INTRAVENOUS

## 2024-06-05 MED ORDER — DEXAMETHASONE SOD PHOSPHATE PF 10 MG/ML IJ SOLN
INTRAMUSCULAR | Status: AC
  Filled 2024-06-05: qty 1

## 2024-06-05 MED ORDER — HYDROMORPHONE HCL 1 MG/ML IJ SOLN
INTRAMUSCULAR | Status: DC | PRN
  Administered 2024-06-05: .5 mg via INTRAVENOUS

## 2024-06-05 MED ORDER — TRANEXAMIC ACID 1000 MG/10ML IV SOLN
INTRAVENOUS | Status: AC
  Filled 2024-06-05: qty 30

## 2024-06-05 MED ORDER — SODIUM CHLORIDE (PF) 0.9 % IJ SOLN
INTRAMUSCULAR | Status: DC | PRN
  Administered 2024-06-05: 100 mL

## 2024-06-05 MED ORDER — DROPERIDOL 2.5 MG/ML IJ SOLN: 0.6250 mg | Freq: Once | INTRAMUSCULAR | Status: DC | PRN

## 2024-06-05 MED ORDER — ROCURONIUM BROMIDE 100 MG/10ML IV SOLN
INTRAVENOUS | Status: DC | PRN
  Administered 2024-06-05: 60 mg via INTRAVENOUS

## 2024-06-05 MED ORDER — DEXAMETHASONE SODIUM PHOSPHATE 4 MG/ML IJ SOLN
INTRAMUSCULAR | Status: DC | PRN
  Administered 2024-06-05: 5 mg via INTRAVENOUS

## 2024-06-05 MED ORDER — IBUPROFEN 200 MG PO TABS
400.0000 mg | ORAL_TABLET | Freq: Four times a day (QID) | ORAL | Status: DC | PRN
  Administered 2024-06-05 – 2024-06-06 (×2): 400 mg via ORAL
  Filled 2024-06-05 (×2): qty 2

## 2024-06-05 MED ORDER — ONDANSETRON HCL 4 MG/2ML IJ SOLN
INTRAMUSCULAR | Status: AC
  Filled 2024-06-05: qty 2

## 2024-06-05 MED ORDER — SODIUM CHLORIDE (PF) 0.9 % IJ SOLN
INTRAMUSCULAR | Status: DC | PRN
  Administered 2024-06-05: 1000 mL

## 2024-06-05 MED ORDER — DIPHENHYDRAMINE HCL 50 MG/ML IJ SOLN
INTRAMUSCULAR | Status: DC | PRN
  Administered 2024-06-05: 6.25 mg via INTRAVENOUS

## 2024-06-05 MED ORDER — BUPIVACAINE-EPINEPHRINE (PF) 0.25% -1:200000 IJ SOLN
INTRAMUSCULAR | Status: AC
  Filled 2024-06-05: qty 30

## 2024-06-05 MED ORDER — POLYETHYLENE GLYCOL 3350 17 G PO PACK: 17.0000 g | PACK | Freq: Every day | ORAL | Status: DC | PRN

## 2024-06-05 MED ORDER — ONDANSETRON HCL 4 MG/2ML IJ SOLN
INTRAMUSCULAR | Status: DC | PRN
  Administered 2024-06-05: 4 mg via INTRAVENOUS

## 2024-06-05 MED ORDER — HYDROMORPHONE HCL 1 MG/ML IJ SOLN
INTRAMUSCULAR | Status: AC
  Filled 2024-06-05: qty 0.5

## 2024-06-05 MED ORDER — HYDROMORPHONE HCL 1 MG/ML IJ SOLN: 0.2500 mg | INTRAMUSCULAR | Status: DC | PRN

## 2024-06-05 MED ORDER — FENTANYL CITRATE (PF) 100 MCG/2ML IJ SOLN
INTRAMUSCULAR | Status: DC | PRN
  Administered 2024-06-05: 25 ug via INTRAVENOUS
  Administered 2024-06-05: 50 ug via INTRAVENOUS
  Administered 2024-06-05: 25 ug via INTRAVENOUS
  Administered 2024-06-05 (×2): 50 ug via INTRAVENOUS

## 2024-06-05 MED ORDER — ATROPINE SULFATE (PF) 0.4 MG/ML IJ SOLN
INTRAMUSCULAR | Status: AC
  Filled 2024-06-05: qty 1

## 2024-06-05 MED ORDER — SCOPOLAMINE 1 MG/3DAYS TD PT72
MEDICATED_PATCH | TRANSDERMAL | Status: AC
  Filled 2024-06-05: qty 1

## 2024-06-05 MED ORDER — EPHEDRINE 5 MG/ML INJ
INTRAVENOUS | Status: AC
  Filled 2024-06-05: qty 5

## 2024-06-05 MED ORDER — PHENYLEPHRINE 80 MCG/ML (10ML) SYRINGE FOR IV PUSH (FOR BLOOD PRESSURE SUPPORT)
PREFILLED_SYRINGE | INTRAVENOUS | Status: AC
  Filled 2024-06-05: qty 10

## 2024-06-05 MED ORDER — MAGTRACE LYMPHATIC TRACER
INTRAMUSCULAR | Status: DC | PRN
  Administered 2024-06-05 (×2): 2 mL via INTRAMUSCULAR

## 2024-06-05 MED ORDER — ACETAMINOPHEN 500 MG PO TABS
1000.0000 mg | ORAL_TABLET | ORAL | Status: AC
  Administered 2024-06-05: 1000 mg via ORAL

## 2024-06-05 MED ORDER — BUPIVACAINE-EPINEPHRINE (PF) 0.5% -1:200000 IJ SOLN
INTRAMUSCULAR | Status: AC
  Filled 2024-06-05: qty 1.8

## 2024-06-05 MED ORDER — DIPHENHYDRAMINE HCL 50 MG/ML IJ SOLN
INTRAMUSCULAR | Status: AC
  Filled 2024-06-05: qty 1

## 2024-06-05 MED ORDER — ACETAMINOPHEN 500 MG PO TABS
1000.0000 mg | ORAL_TABLET | Freq: Four times a day (QID) | ORAL | Status: DC | PRN
  Administered 2024-06-06: 1000 mg via ORAL
  Filled 2024-06-05: qty 2

## 2024-06-05 MED ORDER — TRANEXAMIC ACID 1000 MG/10ML IV SOLN
Status: DC | PRN
  Administered 2024-06-05: 3000 mg via TOPICAL

## 2024-06-05 MED ORDER — ONDANSETRON HCL 4 MG/2ML IJ SOLN: 4.0000 mg | Freq: Four times a day (QID) | INTRAMUSCULAR | Status: DC | PRN

## 2024-06-05 MED ORDER — MIDAZOLAM HCL 5 MG/5ML IJ SOLN
INTRAMUSCULAR | Status: DC | PRN
  Administered 2024-06-05: 2 mg via INTRAVENOUS

## 2024-06-05 MED ORDER — ROCURONIUM BROMIDE 10 MG/ML (PF) SYRINGE
PREFILLED_SYRINGE | INTRAVENOUS | Status: AC
  Filled 2024-06-05: qty 10

## 2024-06-05 MED ORDER — PROPOFOL 10 MG/ML IV BOLUS
INTRAVENOUS | Status: DC | PRN
  Administered 2024-06-05: 180 mg via INTRAVENOUS

## 2024-06-05 MED ORDER — CIPROFLOXACIN IN D5W 400 MG/200ML IV SOLN
400.0000 mg | Freq: Once | INTRAVENOUS | Status: AC
  Administered 2024-06-05: 400 mg via INTRAVENOUS

## 2024-06-05 MED ORDER — SODIUM CHLORIDE (PF) 0.9 % IJ SOLN
INTRAMUSCULAR | Status: AC
  Filled 2024-06-05: qty 50

## 2024-06-05 MED ORDER — DEXMEDETOMIDINE HCL IN NACL 80 MCG/20ML IV SOLN
INTRAVENOUS | Status: DC | PRN
  Administered 2024-06-05: 8 ug via INTRAVENOUS

## 2024-06-05 NOTE — Discharge Instructions (Addendum)
 INSTRUCTIONS FOR AFTER BREAST SURGERY   You will likely have some questions about what to expect following your operation.  The following information will help you and your family understand what to expect when you are discharged from the hospital.  It is important to follow these guidelines to help ensure a smooth recovery and reduce complication.  Postoperative instructions include information on: diet, wound care, medications and physical activity.  AFTER SURGERY Expect to go home after the procedure.  In some cases, you may need to spend one night in the hospital for observation.  DIET Breast surgery does not require a specific diet.  However, the healthier you eat the better your body will heal. It is important to increasing your protein intake.  This means limiting the foods with sugar and carbohydrates.  Focus on vegetables and some meat.  If you have liposuction during your procedure be sure to drink water .  If your urine is bright yellow, then it is concentrated, and you need to drink more water .  As a general rule after surgery, you should have 8 ounces of water  every hour while awake.  If you find you are persistently nauseated or unable to take in liquids let us  know.  NO TOBACCO USE or EXPOSURE.  This will slow your healing process and lead to a wound.  WOUND CARE Leave the binder on at all times except when showering . Use fragrance free soap like Dial, Dove or Ivory.   YOU CANNOT SHOWER / GET THE SURGICAL SITE WET UNTIL THE DRAINS ARE REMOVED.  No baths, pools or hot tubs for a minimum four weeks. We close your incision to leave the smallest and best-looking scar. No ointment or creams on your incisions for four weeks.  No Neosporin (Too many skin reactions).  A few weeks after surgery you can use Mederma and start massaging the scar. We ask you to wear your binder or sports bra for the first 6 weeks around the clock, including while sleeping. This provides added comfort and helps  reduce the fluid accumulation at the surgery site. NO Ice or heating pads to the operative site.  You have a very high risk of a BURN before you feel the temperature change. Continue to empty, recharge, & record drainage from drains 2-3 times a day, as needed.   ACTIVITY No heavy lifting until cleared by the doctor.  This usually means no more than a half-gallon of milk.  It is OK to walk and climb stairs. Moving your legs is very important to decrease your risk of a blood clot.  It will also help keep you from getting deconditioned.  Every 1 to 2 hours get up and walk for 5 minutes. This will help with a quicker recovery back to normal.  Let pain be your guide so you don't do too much.  This time is for you to recover.  You will be more comfortable if you sleep and rest with your head elevated either with a few pillows under you or in a recliner.  No stomach sleeping for a three months.  WORK Everyone returns to work at different times. As a rough guide, most people take at least 1 - 2 weeks off prior to returning to work. If you need documentation for your job, give the forms to the front staff at the clinic.  DRIVING Arrange for someone to bring you home from the hospital after your surgery.  You may be able to drive a few days  after surgery but not while taking any narcotics or valium .  BOWEL MOVEMENTS Constipation can occur after anesthesia and while taking pain medication.  It is important to stay ahead for your comfort.  We recommend taking Milk of Magnesia (2 tablespoons; twice a day) while taking the pain pills.  MEDICATIONS You may be prescribed should start after surgery At your preoperative visit for you history and physical you may have been given the following medications: Zofran  4 mg:  This is to treat nausea and vomiting.  You can take this every 6 hours as needed and only if needed. Oxycodone  5 mg:  This is only to be used after you have taken the Motrin  or the Tylenol . Every 8  hours as needed. Do not take at the same time as gabapentin  or muscle relaxer as these can be sedating. Gabapentin : This is a medication for nerve pain (burning sensation or tingling pain). Do not take at the same time as muscle relaxer or pain pill as they can be sedating.  Methocarbamol : This is a muscle relaxer. Do not take at the same time as gabapentin  or pain pill as they can be sedating. Bactrim : This is an antibiotic. Please take as directed on the bottle.    Over the counter Medication to take: Ibuprofen  (Motrin ) 600 mg:  Take this every 6 hours.  If you have additional pain then take 500 mg of the Tylenol  every 8 hours.  Only take the Norco after you have tried these two. MiraLAX  or Milk of Magnesia: Take this according to the bottle if you take the Norco.  WHEN TO CALL Call your surgeon's office if any of the following occur: Fever 101 degrees F or greater Excessive bleeding or fluid from the incision site. Pain that increases over time without aid from the medications Redness, warmth, or pus draining from incision sites Persistent nausea or inability to take in liquids Severe misshapen area that underwent the operation.  Here are some resources for breast cancer patients:  Plastic surgery website: https://www.plasticsurgery.org/for-medical-professionals/education-and-resources/publications/breast-reconstruction-magazine Breast Reconstruction Awareness Campaign:  chesscontest.fr Plastic surgery Implant information:  https://www.plasticsurgery.org/patient-safety/breast-implant-safety   Information for Discharge Teaching: EXPAREL  (bupivacaine  liposome injectable suspension)   Pain relief is important to your recovery. The goal is to control your pain so you can move easier and return to your normal activities as soon as possible after your procedure. Your physician may use several types of medicines to manage pain, swelling, and more.  Your surgeon or  anesthesiologist gave you EXPAREL (bupivacaine ) to help control your pain after surgery.  EXPAREL  is a local anesthetic designed to release slowly over an extended period of time to provide pain relief by numbing the tissue around the surgical site. EXPAREL  is designed to release pain medication over time and can control pain for up to 72 hours. Depending on how you respond to EXPAREL , you may require less pain medication during your recovery. EXPAREL  can help reduce or eliminate the need for opioids during the first few days after surgery when pain relief is needed the most. EXPAREL  is not an opioid and is not addictive. It does not cause sleepiness or sedation.   Important! A teal colored band has been placed on your arm with the date, time and amount of EXPAREL  you have received. Please leave this armband in place for the full 96 hours following administration, and then you may remove the band. If you return to the hospital for any reason within 96 hours following the administration of EXPAREL , the armband  provides important information that your health care providers to know, and alerts them that you have received this anesthetic.    Possible side effects of EXPAREL : Temporary loss of sensation or ability to move in the area where medication was injected. Nausea, vomiting, constipation Rarely, numbness and tingling in your mouth or lips, lightheadedness, or anxiety may occur. Call your doctor right away if you think you may be experiencing any of these sensations, or if you have other questions regarding possible side effects.  Follow all other discharge instructions given to you by your surgeon or nurse. Eat a healthy diet and drink plenty of water  or other fluids.   Last received tylenol  at 2am Last received advil  at [facility]

## 2024-06-05 NOTE — Op Note (Signed)
 DATE OF OPERATION: 06/05/2024  LOCATION: [facility] operating Room  PREOPERATIVE DIAGNOSIS: Bilateral mastectomies secondary to elevated cancer risk  POSTOPERATIVE DIAGNOSIS: Same  PROCEDURE: Placement of bilateral prepectoral tissue expanders  SURGEON: Marinell Birmingham, MD  ASSISTANT: Estefana Peck  EBL: 25 cc  CONDITION: Stable  COMPLICATIONS: None  INDICATION: The patient, [name], is a 44 y.o. female born on 1980-10-04, is here for treatment after mastectomies.  Tissue expanders placed for her stage of reconstruction.   PROCEDURE DETAILS:  The patient was seen prior to surgery and marked.  The IV antibiotics were given. The patient was taken to the operating room and given a general anesthetic. A standard time out was performed and all information was confirmed by those in the room. SCDs were placed.   The patient was prepped and draped in usual sterile manner prior to starting my portion of the procedure.  She underwent bilateral simple mastectomies with Dr. Vicenta Poli.  Once these were completed I assumed care of the patient.  The surgical sites were irrigated with saline and meticulous hemostasis achieved with electrocautery.  Once I was happy with the hemostasis 2 TXA soaked sponges were placed in the surgical bed and allowed to dwell for 5 minutes.  The sponges were removed and 219 French round drains were placed in the surgical bed and brought out through separate stab incisions.  The chest was reprepped with Hibiclens  and sterile towels placed.  The entire surgical team changed gloves and we prepared the tissue expanders.  450 cc high-profile [place] tissue expanders were selected and these were wrapped with AlloDerm and the AlloDerm sutured in place with pursestring suture.  The tissue expander on the left was placed first using meticulous sterile technique was placed in the surgical bed and the 10/31/2010 and 3:00 tabs were sutured in place with 3-0 PDS sutures.  The  skin edges were tailor tacked over the top of the tissue expander and the right tissue expander was placed in a similar manner.  The deep tissues were closed with interrupted 3-0 PDS sutures.  The dermis was closed with interrupted and running 3-0 Monocryl sutures and the skin was closed with a running 4-0 Monocryl subcuticular stitch.  Each tissue expander was then filled with 150 mL of sterile saline.  The incisions were sealed with Dermabond and sterile dressings applied.  The patient was awakened from anesthesia without incident transferred to the recovery room in good condition.  All instrument needle and sponge counts were reported as correct and no complications were appreciated during the procedure. The patient was allowed to wake up and taken to recovery room in stable condition at the end of the case. The family was notified at the end of the case.   The advanced practice practitioner (APP) assisted throughout the case.  The APP was essential in retraction and counter traction when needed to make the case progress smoothly.  This retraction and assistance made it possible to see the tissue plans for the procedure.  The assistance was needed for blood control, tissue re-approximation and assisted with closure of the incision site.

## 2024-06-05 NOTE — H&P (Signed)
 "  PROVIDER: VICENTA DASIE POLI, MD  MRN: [id number] DOB: 1981-02-27  Subjective   Chief Complaint: Post Operative Visit   History of Present Illness: [name] is a 44 y.o. female who is seen today for a follow-up regarding her left nipple biopsy as well as now her greater than 30% risk of breast cancer after evaluation by genetics and high risk breast cancer clinic. She has since seen plastic surgery and has been given multiple options for reconstruction as she is again desiring bilateral mastectomies. She is having some pain in her left nipple but is otherwise doing well.    Review of Systems: A complete review of systems was obtained from the patient. I have reviewed this information and discussed as appropriate with the patient. See HPI as well for other ROS.  ROS   Medical History: Past Medical History:  Diagnosis Date  Arthritis  left foot  Diabetes mellitus type 2, uncomplicated (CMS/HHS-HCC)   Patient Active Problem List  Diagnosis  Left foot pain  Tarsal coalitions, left  Osteoarthritis of left subtalar joint   Past Surgical History:  Procedure Laterality Date  FUSION FOOT SUBTALAR Left 06/04/2015  Procedure: (RCC) ARTHRODESIS; SUBTALAR; Surgeon: Oneil FORBES Bill, MD; Location: [facility] OR; Service: Orthopedics; Laterality: Left;  CESAREAN SECTION  CHOLECYSTECTOMY OPEN  nose surgery    Allergies  Allergen Reactions  Acetaminophen  Swelling  Amoxicillin  Hives and Swelling  Ibuprofen  Hives  Penicillins Hives and Swelling  Shellfish Containing Products Hives   Current Outpatient Medications on File Prior to Visit  Medication Sig Dispense Refill  carvediloL  (COREG ) 3.125 MG tablet Take 3.125 mg by mouth 2 (two) times daily  metFORMIN  (GLUCOPHAGE ) 500 MG tablet Take by mouth.  sertraline  (ZOLOFT ) 50 MG tablet Take by mouth.  oxyCODONE  (OXYCONTIN ) 10 MG CR tablet 1-2 po bid for pain, use oxycodone  for breakthrough pain prn (Patient not taking: Reported on  07/12/2016 ) 20 tablet 0  oxyCODONE  (ROXICODONE ) 5 MG immediate release tablet Take 1-3 tablets (5-15 mg total) by mouth every 4 (four) hours as needed for Pain (Wean as able) for up to 75 doses. (Patient not taking: Reported on 07/12/2016 ) 75 tablet 0   No current facility-administered medications on file prior to visit.   Family History  Problem Relation Age of Onset  Alcohol abuse Father  Diabetes type II Father  Asthma Brother  Kidney disease Maternal Grandmother  Anesthesia problems Neg Hx  Malignant hypertension Neg Hx    Social History   Tobacco Use  Smoking Status Never  Smokeless Tobacco Never    Social History   Socioeconomic History  Marital status: Married  Tobacco Use  Smoking status: Never  Smokeless tobacco: Never  Vaping Use  Vaping status: Never Used  Substance and Sexual Activity  Alcohol use: Yes  Comment: Rare  Drug use: No  Sexual activity: Yes  Partners: Male  Birth control/protection: Surgical   Social Drivers of Health   Financial Resource Strain: Low Risk (01/06/2024)  Received from [facility]  Overall Financial Resource Strain (CARDIA)  How hard is it for you to pay for the very basics like food, housing, medical care, and heating?: Not hard at all  Food Insecurity: Food Insecurity Present (02/23/2024)  Received from [facility]  Hunger Vital Sign  Within the past 12 months, you worried that your food would run out before you got the money to buy more.: Sometimes true  Within the past 12 months, the food you bought just didn't last and you  didn't have money to get more.: Sometimes true  Transportation Needs: No Transportation Needs (02/23/2024)  Received from [facility] - Transportation  In the past 12 months, has lack of transportation kept you from medical appointments or from getting medications?: No  In the past 12 months, has lack of transportation kept you from meetings, work, or from getting things needed for daily  living?: No  Physical Activity: Inactive (01/06/2024)  Received from [facility]  Exercise Vital Sign  On average, how many days per week do you engage in moderate to strenuous exercise (like a brisk walk)?: 0 days  Stress: Stress Concern Present (01/06/2024)  Received from [facility] - Occupational Stress Questionnaire  Do you feel stress - tense, restless, nervous, or anxious, or unable to sleep at night because your mind is troubled all the time - these days?: Rather much  Social Connections: Moderately Integrated (01/06/2024)  Received from [facility]  Social Connection and Isolation Panel  In a typical week, how many times do you talk on the phone with family, friends, or neighbors?: Twice a week  How often do you get together with friends or relatives?: Once a week  How often do you attend church or religious services?: More than 4 times per year  Do you belong to any clubs or organizations such as church groups, unions, fraternal or athletic groups, or school groups?: No  Are you married, widowed, divorced, separated, never married, or living with a partner?: Married   Objective:   Vitals:   PainSc: 2  PainLoc: Breast   There is no height or weight on file to calculate BMI.  Physical Exam   She appears well on exam Chaperone was present The nipple incision is well-healed without evidence of infection  Labs, Imaging and Diagnostic Testing:  I have reviewed the notes from the genetics counselor and plastic surgery  Assessment and Plan:   Diagnoses and all orders for this visit:  At high risk for breast cancer  Postop check    At this point, she is high risk for breast cancer in the future and bilateral mastectomies as one of the recommendations to help prevent this. Given the problems she is have with her nipples and her genetic predisposition, I would recommend bilateral simple mastectomies with complete removal of the  nipple areolar complexes and she agrees to this as well. After further discussion she would like to have tissue expanders placed by plastic surgery at the time of the procedure and then we will decide after that and what her final reconstruction will be. I again discussed the risk of surgery which includes but is not limited to bleeding, infection, the potential find malignancy in the breast tissue on either side, cardiopulmonary issues with anesthesia, blood clots, excetra. Surgery be scheduled in conjunction with plastic surgery as soon as possible  "

## 2024-06-05 NOTE — Op Note (Signed)
 "  [name] 06/05/2024   Pre-op Diagnosis: HIGH LIFETIME RISK OF BREAST CANCER     Post-op Diagnosis: SAME  Procedures: BILATERAL TOTAL MASTECTOMY INJECTION OF MAG TRACE FOR LYMPH NODE MAPPING  Surgeon: Vicenta Poli, MD  Anesthesia: General  Staff:  Circulator: McDonough-Hughes, Jodi C, RN Relief Circulator: [place], Hadassah Buel RAMAN, RN Scrub Person: Milford Asberry CROME Circulator Assistant: [place], Hadassah Buel RAMAN, RN Surgical/MD Assistant: Andris Estefana BRAVO, PA-C  Estimated Blood Loss: Minimal               Specimens: sent to path  Indications: This is a 44 year old female with significantly dense breast and a history of bloody nipple discharge.  MRI showed dilated ducts suspicion of a 6 mm mass underneath the nipple which was biopsied.  This was found to be more consistent with infection.  Because of her significant family history she was sent to high risk breast clinic and they determined she is at a greater than 35% risk lifetime of developing breast cancer so decision was made to proceed with bilateral mastectomies with immediate reconstruction  Procedure: The patient was brought operating identified the correct patient.  She was placed upon the operating table and general anesthesia was induced.  Under sterile technique I then injected 2 cc of mag trace underneath each of the nipple-areolar complexes and massaged the breast.  Her breasts were then prepped and draped in usual sterile fashion.  Dr. Waddell plastic surgery had already marked where he wanted the mastectomy decisions to be for the eventual reconstruction.  I started on the left side first and performed elliptical incision in the previous marked area with scalpel.  I then dissected down circumferentially to the breast tissue with electrocautery.  I then dissected out the superior skin flap for starting medially near the sternum and going laterally toward the axilla staying just underneath the skin and dermis.  She  had very fibrocystic breast especially in the upper outer quadrant making the dissection slightly difficult.  I then took this all way down to the chest wall as I approached the level of the clavicle.  I then dissected the inferior skin flap staying just underneath the skin and dermis with cautery going down to the inframammary ridge.  I then took the dissection laterally toward the axilla and continued down toward the chest wall as I got lateral to the pectoralis muscles.  I then dissected the breast tissue off the chest wall dissecting medial to lateral with the cautery until I reached past the pectoralis muscle and completed the total mastectomy.  I then marked the lateral margin for identification purposes with a silk suture.  I achieved hemostasis near the axilla with several surgical clips and the cautery.  Again she had very fibrocystic appearing breast on that side.  I next performed a similar elliptical incision on the right breast including the nipple areolar complex with scalpel.  I again dissected down into the breast tissue circumferentially with electrocautery.  I again performed a superior skin flap first going toward the level of the clavicle and down toward the chest wall.  The right breast was similar having significant fibrocystic tissue especially toward the axilla.  I then took the dissection from medially near the sternum toward the axilla with the cautery.  I next dissected the inferior skin flap staying again just underneath the skin and dermis going down toward the inframammary ridge.  I then slowly dissected the breast tissue off of the pectoralis muscle with  electrocautery again dissecting medial to lateral as I reached the area of the axilla and the pectoralis muscles.  I then took the dissection down to the chest wall.  I then took some extra breast tissue in the upper outer quadrant as the skin flap was still thick using electrocautery.  This was sent with the right mastectomy.  I then  achieved hemostasis with the electrocautery and several surgical clips were in the axilla.  At this point again the skin flaps appeared viable.  Hemostased appeared to be achieved.  Dr. Waddell then presented to the room to continue his portion of the placement of tissue expanders for eventual reconstruction.  She remained stable with counts correct.          Vicenta Poli   Date: 06/05/2024  Time: 9:34 AM    "

## 2024-06-05 NOTE — Transfer of Care (Signed)
 Immediate Anesthesia Transfer of Care Note  Patient: [name]  Procedure(s) Performed: MASTECTOMY, SIMPLE (Bilateral: Breast) RECONSTRUCTION, BREAST (Bilateral: Chest)  Patient Location: PACU  Anesthesia Type:General  Level of Consciousness: awake, alert , oriented, drowsy, and patient cooperative  Airway & Oxygen Therapy: Patient Spontanous Breathing and Patient connected to face mask oxygen  Post-op Assessment: Report given to RN and Post -op Vital signs reviewed and stable  Post vital signs: Reviewed and stable  Last Vitals:  Vitals Value Taken Time  BP    Temp    Pulse 91 06/05/24 11:45  Resp 26 06/05/24 11:45  SpO2 95 % 06/05/24 11:45  Vitals shown include unfiled device data.  Last Pain:  Vitals:   06/05/24 0634  TempSrc: Temporal  PainSc: 0-No pain      Patients Stated Pain Goal: 3 (06/05/24 9365)  Complications: No notable events documented.

## 2024-06-05 NOTE — Anesthesia Postprocedure Evaluation (Signed)
"   Anesthesia Post Note  Patient: [name]  Procedure(s) Performed: MASTECTOMY, SIMPLE (Bilateral: Breast) RECONSTRUCTION, BREAST (Bilateral: Chest)     Patient location during evaluation: Other Anesthesia Type: General Level of consciousness: awake and alert Pain management: pain level controlled Vital Signs Assessment: post-procedure vital signs reviewed and stable Respiratory status: spontaneous breathing, nonlabored ventilation and respiratory function stable Cardiovascular status: blood pressure returned to baseline Postop Assessment: no apparent nausea or vomiting Anesthetic complications: no   No notable events documented.  Last Vitals:  Vitals:   06/05/24 1230 06/05/24 1330  BP: 115/77   Pulse: 82   Resp: 18   Temp: 36.6 C   SpO2: 100% 98%    Last Pain:  Vitals:   06/05/24 1230  TempSrc:   PainSc: 0-No pain                 Vertell Row      "

## 2024-06-05 NOTE — Interval H&P Note (Signed)
 History and Physical Interval Note: No change in exam or indication for surgery All questions answered Bilateral breasts marked Will proceed with placement of tissue expanders after bilateral mastectomies at her request  06/05/2024 7:06 AM  [name]  has presented today for surgery, with the diagnosis of HIGH RISK OF BREAST CANCER.  The various methods of treatment have been discussed with the patient and family. After consideration of risks, benefits and other options for treatment, the patient has consented to  Procedures with comments: MASTECTOMY, SIMPLE (Bilateral) - LMA RECONSTRUCTION, BREAST (Bilateral) - BILATERAL BREAST RECONSTRUCTION, POSSIBLE TISSUE EXPANDERS AND IMPLANTS as a surgical intervention.  The patient's history has been reviewed, patient examined, no change in status, stable for surgery.  I have reviewed the patient's chart and labs.  Questions were answered to the patient's satisfaction.     Leonce KATHEE Birmingham

## 2024-06-05 NOTE — Anesthesia Procedure Notes (Signed)
 Procedure Name: Intubation Date/Time: 06/05/2024 8:21 AM  Performed by: Emilio Rock BIRCH, CRNAPre-anesthesia Checklist: Patient identified, Emergency Drugs available, Suction available and Patient being monitored Patient Re-evaluated:Patient Re-evaluated prior to induction Oxygen Delivery Method: Circle system utilized Preoxygenation: Pre-oxygenation with 100% oxygen Induction Type: IV induction Ventilation: Mask ventilation without difficulty Laryngoscope Size: Mac and 3 Grade View: Grade II Tube type: Oral Tube size: 7.0 mm Number of attempts: 1 Airway Equipment and Method: Stylet and Oral airway Placement Confirmation: ETT inserted through vocal cords under direct vision, positive ETCO2 and breath sounds checked- equal and bilateral Secured at: 22 cm Tube secured with: Tape Dental Injury: Teeth and Oropharynx as per pre-operative assessment

## 2024-06-06 ENCOUNTER — Encounter (HOSPITAL_BASED_OUTPATIENT_CLINIC_OR_DEPARTMENT_OTHER): Payer: Self-pay | Admitting: Surgery

## 2024-06-06 NOTE — Progress Notes (Signed)
 Last time received oxycodone  and bactrim  written on dc paperwork.

## 2024-06-12 LAB — SURGICAL PATHOLOGY

## 2024-06-13 ENCOUNTER — Ambulatory Visit: Payer: Self-pay | Admitting: Plastic Surgery

## 2024-06-13 VITALS — BP 151/82 | Temp 98.7°F

## 2024-06-13 DIAGNOSIS — Z803 Family history of malignant neoplasm of breast: Secondary | ICD-10-CM

## 2024-06-13 DIAGNOSIS — Z9889 Other specified postprocedural states: Secondary | ICD-10-CM

## 2024-06-13 NOTE — Progress Notes (Signed)
 [name] returns today 8 days postop from a bilateral mastectomy with immediate placement of prepectoral tissue expanders.  She is doing very well with no specific complaints other than being sore.  She states she has only taken 3 narcotic pain pill since surgery and this is only been at night when she is trying to sleep.  She has been ambulating without difficulty.  Drains showed less than 30 mL/day on the left and 30 mL/day for the past 2 days on the right.  Incisions are clean dry and intact and the tissue expanders are well-positioned.  The skin shows some ecchymoses but there is no erythema.  The left drain is removed today without difficulty.  Will leave the right drain for 1 more day but she may call tomorrow morning if it does not have more than 30 mL today I will remove the right drain as well.  Call for any questions or concerns

## 2024-06-14 ENCOUNTER — Encounter: Payer: Self-pay | Admitting: Student

## 2024-06-14 ENCOUNTER — Telehealth: Payer: Self-pay | Admitting: Plastic Surgery

## 2024-06-14 ENCOUNTER — Ambulatory Visit: Payer: Self-pay | Admitting: Student

## 2024-06-14 VITALS — BP 123/67 | HR 71 | Wt 174.2 lb

## 2024-06-14 DIAGNOSIS — Z9889 Other specified postprocedural states: Secondary | ICD-10-CM

## 2024-06-14 NOTE — Telephone Encounter (Signed)
 Pt called in stated that her drain output is < 10 and is painful, stated she was told to call to get drain removed. Please advise

## 2024-06-14 NOTE — Progress Notes (Signed)
 Patient is a 44 year old female who recently underwent bilateral total mastectomies with Dr. Vernetta followed by placement of bilateral prepectoral tissue expanders with Dr. Waddell on 06/05/2024.  Patient is a little over 1 week postop.  She presents to the clinic today for postoperative follow-up.  Patient was last seen in the clinic yesterday, 06/13/2024.  At this visit, patient was doing well.  She was sore.  Drain showed less than 30 mL/day on the left and 30 mL/day on the right.  Incisions are clean dry and intact.  The skin showed a little bit of ecchymosis.  The left drain was removed.  Plan is to leave the right drain for 1 more day.  Today, patient presents with her daughter at bedside.  She reports that she was seen yesterday.  She states that she has been slowly improving way improving since surgery.  She does report she has been having some limited range of motion on the right side as well as a little bit of back pain on that side.  She denies any fevers or chills.  She reports that her drain output since yesterday at noon has been 35 cc total.  Chaperone present on exam.  On exam, patient is sitting upright in no acute distress.  Expanders are in place bilaterally.  There is no overlying erythema, no obvious fluid collections on exam.  Incisions appear to be intact and healing well.  Right JP drain is in place with minimal serosanguineous drainage.  There is some mild tenderness to palpation to the right lateral back, just posterior of the right axilla.  There is no overlying skin changes, no obvious fluid collections on exam.  There are no signs of infection on exam.  I discussed with the patient that her drain output is still too high to remove the drain at this time.  We did discuss that premature drain removal could result in fluid collections which could result in infection.  We discussed that her drain output needs to be less than 30 cc over a 24-hour period for several days.  Patient  expressed understanding to this.  Discussed with the patient to continue with recording her drain output and continue with compression at all times.  She expressed understanding.  Discussed with the patient that I believe the pain that she is experiencing to her back is muscular in nature.  Recommended she exercise gentle range of motion of her right upper extremity.  We will also try to get the patient set up for physical therapy.  Patient was in agreement with this plan.  Patient to follow back up early next week for hopeful drain removal.  Instructed the patient to call in the meantime if she has any questions or concerns.

## 2024-06-19 ENCOUNTER — Ambulatory Visit: Payer: Self-pay | Admitting: Student

## 2024-06-19 VITALS — BP 115/72 | HR 80

## 2024-06-19 DIAGNOSIS — Z9889 Other specified postprocedural states: Secondary | ICD-10-CM

## 2024-06-19 NOTE — Progress Notes (Signed)
 Patient is a 44 year old female who recently underwent bilateral total mastectomies with Dr. Vernetta followed by placement of bilateral prepectoral tissue expanders with Dr. Waddell on 06/05/2024.  Patient is 2 weeks postop.  She presents to the clinic today for postoperative follow-up.  Patient was last seen in the clinic on 06/14/2024.  At this visit, patient reported she had been slowly improving since surgery.  She was reporting some issues with range of motion of her right upper extremity.  Her drain output still had been 35 cc over a 24-hour period.  On exam, expanders were in place bilaterally, incisions were clean dry and intact.  There were no signs of infection on exam.  It was recommended to keep the drains in place.  Physical therapy referral was placed.  Today, patient presents with her daughter at bedside.  She reports that she has been doing well.  She states that she is going to be starting physical therapy next week.  Patient reports that her drain output on the right side has been about 10 to 15 cc/day for the past few days.  She denies any other issues or concerns.  She denies any fevers or chills.  Patient reports she is interested in an expander fill today.  Chaperone present on exam.  On exam, patient sitting upright in no acute distress.  Expanders are in place bilaterally.  There is no overlying erythema, no obvious fluid collection on exam.  Incisions appear to be clean dry and intact.  Right JP drain is in place with minimal serosanguineous drainage in the bulb.  There are no signs of infection on exam.  Right JP drain was removed without any difficulty.  The drain site was cleaned with Vashe and a Mepilex border dressing was placed over the site.  Patient tolerated well.  Magnet was used to locate the port on the expander.  Skin was cleaned with chlorhexidine .  Injectable saline was placed in each of the expanders with a sterile butterfly needle: Right: 50 cc for a total of 200 cc  / 450 cc Left: 50 cc for a total of 200 cc / 450 cc   I discussed with the patient I do not want her to shower for 48 hours given recent drain removal.  Discussed with her in the meantime, to apply dressing and Vaseline over her drain site daily.  Discussed with her she may start applying Vaseline over the incisions daily at the end of this week.  Patient expressed understanding.  Discussed with patient to continue with compression at all times.  Patient to follow back up next week at her scheduled appointment.  Instructed her to call in the meantime with any questions or concerns.

## 2024-06-21 NOTE — Discharge Summary (Signed)
 Physician Discharge Summary  Patient ID: [name] MRN: [id number] DOB/AGE: 06-24-1980 44 y.o.  Admit date: 06/05/2024 Discharge date: 06/21/2024  Admission Diagnoses: BRCA positive, high risk for breast cancer  Discharge Diagnoses:  BRCA positive, high risk for breast cancer  Discharged Condition: good  Hospital Course: [name] underwent bilateral prophylactic mastectomies with immediate placement of prepectoral tissue expanders.  She was admitted overnight for observation to ensure adequate pain control.  Overnight she had no issues her pain was controlled she was able to ambulate and she was tolerating a diet.  She is discharged home with follow-up in good condition.  Consults: None  Significant Diagnostic Studies: No additional diagnostic procedures  Treatments: Bilateral prophylactic mastectomies with immediate placement of tissue expanders as above  Discharge Exam: Blood pressure 121/77, pulse 61, temperature (!) 97.5 F (36.4 C), resp. rate 16, height 5' 3 (1.6 m), weight 78.9 kg, last menstrual period 05/11/2024, SpO2 100%. On examination there is no evidence of hematoma on either breast.  The drain output is appropriate with no evidence of ongoing bleeding.  Disposition: Discharge disposition: 01-Home or Self Care       Discharge Instructions     Call MD for:  difficulty breathing, headache or visual disturbances   Complete by: As directed    Call MD for:  persistant nausea and vomiting   Complete by: As directed    Call MD for:  redness, tenderness, or signs of infection (pain, swelling, redness, odor or green/yellow discharge around incision site)   Complete by: As directed    Call MD for:  severe uncontrolled pain   Complete by: As directed    Call MD for:  temperature >100.4   Complete by: As directed    Change dressing (specify)   Complete by: As directed    Wear binder at all times unless washing garment Gauze or ABD pads over incisions for comfort    Increase activity slowly   Complete by: As directed       Allergies as of 06/06/2024       Reactions   Amoxicillin  Hives, Swelling   Penicillins Hives, Swelling   Pt received Ceftriaxone  in 2014 without rxn. Allergy updated by [name], PharmD (05/25/22)   Shellfish Allergy Hives        Medication List     STOP taking these medications    gabapentin  300 MG capsule Commonly known as: NEURONTIN    meloxicam  15 MG tablet Commonly known as: MOBIC    methocarbamol  500 MG tablet Commonly known as: ROBAXIN    methylPREDNISolone  4 MG Tbpk tablet Commonly known as: MEDROL  DOSEPAK       TAKE these medications    atorvastatin  20 MG tablet Commonly known as: LIPITOR Take 1 tablet (20 mg total) by mouth daily. What changed: Another medication with the same name was removed. Continue taking this medication, and follow the directions you see here.   carvedilol  3.125 MG tablet Commonly known as: COREG  Take 1 tablet (3.125 mg total) by mouth 2 (two) times daily. What changed: Another medication with the same name was removed. Continue taking this medication, and follow the directions you see here.   cetirizine  10 MG tablet Commonly known as: ZYRTEC  Take 1 tablet (10 mg total) by mouth daily.   escitalopram  10 MG tablet Commonly known as: LEXAPRO  Take 1 tablet (10 mg total) by mouth daily.   fluticasone  50 MCG/ACT nasal spray Commonly known as: FLONASE  Place 2 sprays into both nostrils daily.   ibuprofen   600 MG tablet Commonly known as: ADVIL  Take 1 tablet (600 mg total) by mouth every 8 (eight) hours as needed.   metFORMIN  500 MG 24 hr tablet Commonly known as: GLUCOPHAGE -XR TAKE 2 TABLETS BY MOUTH ONCE DAILY WITH BREAKFAST               Discharge Care Instructions  (From admission, onward)           Start     Ordered   06/06/24 0000  Change dressing (specify)       Comments: Wear binder at all times unless washing garment Gauze or ABD pads over  incisions for comfort   06/06/24 0824            Follow-up Information     [facility] Follow up.   Specialty: Plastic Surgery Why: Please follow up at your scheduled appointment Contact information: [address] [place]  [address] [phone]              [name] is discharged home with follow-up arranged.  She is instructed on wound care and drain care.  She will call the office for any questions or concerns.  [facility] [address] [place], [place] [address] [phone]  Signed: Leonce KATHEE [place] 06/21/2024, 7:41 AM

## 2024-06-26 ENCOUNTER — Other Ambulatory Visit: Payer: Self-pay

## 2024-06-27 ENCOUNTER — Ambulatory Visit: Payer: Self-pay | Admitting: Plastic Surgery

## 2024-06-27 ENCOUNTER — Ambulatory Visit: Payer: Self-pay

## 2024-06-27 ENCOUNTER — Other Ambulatory Visit: Payer: Self-pay

## 2024-06-27 ENCOUNTER — Encounter: Payer: Self-pay | Admitting: Plastic Surgery

## 2024-06-27 VITALS — BP 122/83 | HR 74

## 2024-06-27 DIAGNOSIS — M25612 Stiffness of left shoulder, not elsewhere classified: Secondary | ICD-10-CM

## 2024-06-27 DIAGNOSIS — M6281 Muscle weakness (generalized): Secondary | ICD-10-CM

## 2024-06-27 DIAGNOSIS — Z9889 Other specified postprocedural states: Secondary | ICD-10-CM

## 2024-06-27 DIAGNOSIS — M25611 Stiffness of right shoulder, not elsewhere classified: Secondary | ICD-10-CM

## 2024-06-27 DIAGNOSIS — Z9189 Other specified personal risk factors, not elsewhere classified: Secondary | ICD-10-CM

## 2024-06-27 MED ORDER — CARVEDILOL 3.125 MG PO TABS
3.1250 mg | ORAL_TABLET | Freq: Two times a day (BID) | ORAL | 2 refills | Status: AC
  Filled 2024-06-27: qty 180, 90d supply, fill #0

## 2024-06-27 NOTE — Progress Notes (Signed)
 Ms. Thoreson returns today for evaluation.  She had undergone bilateral mastectomies with placement of bilateral tissue expanders.  She has done well postoperatively with no specific issues.  She does note that the right tissue expander is a little more laterally placed than the left.  She also feels that she is having over all body chills whenever she gets cold and attributes this to the coldness of her tissue expanders.  Overall she is quite happy.      Magnet was used to locate the port on the expander.  Skin was cleaned with chlorhexidine .  Injectable saline was placed in each of the expanders with a sterile butterfly needle: Right: 50 cc for a total of 250 cc / 450 cc Left: 50 cc for a total of 250 cc / 450 cc   No complications with tissue expander fill today.  Follow-up in 1 to 2 weeks.

## 2024-06-28 ENCOUNTER — Other Ambulatory Visit: Payer: Self-pay

## 2024-06-28 ENCOUNTER — Encounter: Payer: Self-pay | Admitting: Rehabilitation

## 2024-06-28 ENCOUNTER — Ambulatory Visit: Payer: Self-pay | Admitting: Rehabilitation

## 2024-06-28 DIAGNOSIS — M6281 Muscle weakness (generalized): Secondary | ICD-10-CM

## 2024-06-28 DIAGNOSIS — M25611 Stiffness of right shoulder, not elsewhere classified: Secondary | ICD-10-CM

## 2024-06-28 DIAGNOSIS — R7303 Prediabetes: Secondary | ICD-10-CM

## 2024-06-28 DIAGNOSIS — M25612 Stiffness of left shoulder, not elsewhere classified: Secondary | ICD-10-CM

## 2024-06-28 DIAGNOSIS — Z9889 Other specified postprocedural states: Secondary | ICD-10-CM

## 2024-06-28 MED ORDER — METFORMIN HCL ER 500 MG PO TB24
500.0000 mg | ORAL_TABLET | Freq: Every day | ORAL | 3 refills | Status: AC
  Filled 2024-06-28: qty 90, 90d supply, fill #0

## 2024-06-28 NOTE — Therapy (Signed)
 " OUTPATIENT PHYSICAL THERAPY  UPPER EXTREMITY ONCOLOGY   Patient Name: [name] MRN: [id number] DOB:September 08, 1980, 44 y.o., female Today's Date: 06/28/2024  END OF SESSION:  PT End of Session - 06/28/24 0812     Visit Number 2    Number of Visits 12    Date for Recertification  08/08/24    PT Start Time 0813    PT Stop Time 0855    PT Time Calculation (min) 42 min    Activity Tolerance Patient tolerated treatment well    Behavior During Therapy [facility] for tasks assessed/performed          Past Medical History:  Diagnosis Date   Abdominal pain 03/29/2021   Adenoma of left nipple 01/04/2024   0.6 cm hypoechoic mass seen on mammogram and targeted US  01/03/2024, recommended bilateral breast MRI w/o and w/ contrast for further evaluation indicated.     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   Cystic thyroid  nodule 07/03/2014   Diabetes mellitus without complication (HCC)    Dorsal wrist ganglion 05/20/2021   Eczema    Family history of anesthesia complication    Mother had problem waking up   Family history of brain cancer    Family history of breast cancer    Family history of cervical cancer    Family history of prostate cancer    Family history of stomach cancer    Fatty liver disease, nonalcoholic 09/18/2009   Qualifier: Diagnosis of   By: Edrick MD, Devere         Flank pain 03/29/2021   Ganglion cyst of dorsum of right wrist    Ganglion cyst of volar aspect of right wrist 04/30/2022   Heart palpitations 10/11/2023   Hematochezia 03/08/2023   Hoarseness of voice 05/22/2020   Hx gestational diabetes     Hyperlipidemia 05/20/2021   Hypertension 10/25/2023   Multiple food allergies 10/24/2012   Neuromuscular disorder (HCC)    Obesity, unspecified 07/23/2012   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      09/2016 :   IMPRESSION:  1. No acute intracranial abnormality identified.  2. Diminished 2 mm focus of hypoenhancement within left posterior pituitary may represent scarring or residual microadenoma. No new pituitary mass identified.  3. Otherwise unremarkable MRI of the brain for age.        RUQ pain 02/02/2012   Seasonal allergies 10/24/2012   Vaginal discharge 01/08/2023   Vision changes 03/01/2021   Past Surgical History:  Procedure Laterality Date   BREAST BIOPSY Right 08/18/2023   US  RT BREAST BX W LOC DEV 1ST LESION IMG BX SPEC US  GUIDE 08/18/2023 [facility] MAMMOGRAPHY   BREAST RECONSTRUCTION Bilateral 06/05/2024   Procedure: RECONSTRUCTION, BREAST;  Surgeon: Waddell Leonce NOVAK, MD;  Location: [facility];  Service: Plastics;  Laterality: Bilateral;  BILATERAL BREAST RECONSTRUCTION WITH  TISSUE EXPANDERS AND IMPLANTS   CESAREAN SECTION     CHOLECYSTECTOMY N/A 09/06/2013   Procedure: LAPAROSCOPIC CHOLECYSTECTOMY;  Surgeon: Vicenta LABOR  Vernetta, MD;  Location: [facility];  Service: General;  Laterality: N/A;   EXCISION OF BREAST BIOPSY Left 02/17/2024   Procedure: EXCISIONAL BIOPSY OF LEFT NIPPLE MASS;  Surgeon: Vernetta Berg, MD;  Location: [facility];  Service: General;  Laterality: Left;  LMA EXCISIONAL BIOPSY LEFT NIPPLE MASS   GANGLION CYST EXCISION Right 08/03/2021   Procedure: RIGHT REMOVAL GANGLION OF WRIST;  Surgeon: Romona Harari, MD;  Location: [facility];  Service: Orthopedics;  Laterality: Right;  regional with monitored anesthesia care   GANGLION CYST EXCISION Right 05/26/2022   Procedure: RIGHT WRIST VOLAR GANGLION CYST REMOVAL;  Surgeon: Jerri Kay HERO,  MD;  Location: [facility];  Service: Orthopedics;  Laterality: Right;   NASAL SINUS SURGERY  2003   TOTAL MASTECTOMY Bilateral 06/05/2024   Procedure: MASTECTOMY, SIMPLE;  Surgeon: Vernetta Berg, MD;  Location:  SURGERY CENTER;  Service: General;  Laterality: Bilateral;  LMA   TUBAL LIGATION     Patient Active Problem List   Diagnosis Date Noted   Breast cancer (HCC) 06/05/2024   Family history of cervical cancer    Family history of prostate cancer    Family history of breast cancer    Family history of stomach cancer    Family history of brain cancer    Adenoma of left nipple 01/04/2024   Carotid artery disease 10/25/2023   Hypertension 10/25/2023   Angio-edema    Complication of anesthesia    Diabetes mellitus without complication (HCC)    Eczema    Family history of anesthesia complication    Hx gestational diabetes    Spinal headache    Heart palpitations 10/11/2023   Hematochezia 03/08/2023   Vaginal discharge 01/08/2023   Ganglion cyst of volar aspect of right wrist 04/30/2022   Ganglion cyst of dorsum of right wrist    Hyperlipidemia 05/20/2021   Dorsal wrist ganglion 05/20/2021   Flank pain 03/29/2021   Abdominal pain 03/29/2021   Vision changes 03/01/2021   Hoarseness of voice 05/22/2020   Chest pain of uncertain etiology 08/01/2019   Breast nodule 10/06/2017   Breast mass in female 10/06/2017   Congenital vertical talus deformity, 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 PA  REFERRING DIAG: s/p Bilateral Mastectomy with Breast reconstruction with tissue  expanders  THERAPY DIAG:  Status post bilateral breast reconstruction  Stiffness of left shoulder, not elsewhere classified  Stiffness of right shoulder, not elsewhere classified  Muscle weakness (generalized)  ONSET DATE: 06/05/2024  Rationale for Evaluation and Treatment: Rehabilitation  SUBJECTIVE:  SUBJECTIVE STATEMENT:  I am mainly just sore from the fill.  I took some mm relaxer last night.    PERTINENT HISTORY:  Pt was at high risk for breast Cancer. She underwent bilateral Mastectomies with immediate tissue expanders on 06/05/2024. She is experiencing some pain and weakness on the right side.  Right drain was removed on 06/19/2024. She is no lifting/ push/pull/carry greater than 20# for 6 weeks. There were no LN's removed  PAIN:  Are you having pain? Yes NPRS scale: 5/10 Pain location: Right axillary region, and right lats, shoulder blade area, chest Pain orientation: Right  PAIN TYPE: sharp Pain description: sharp and burning, tight  Aggravating factors: using right hand Relieving factors: not using right hand  PRECAUTIONS: no lifting/ push/pull/carry greater than 20# for 6 weeks.   RED FLAGS: None   WEIGHT BEARING RESTRICTIONS: No  FALLS:  Has patient fallen in last 6 months? 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  COGNITION: Overall cognitive status: Within functional limits for tasks  assessed   PALPATION:   OBSERVATIONS / OTHER ASSESSMENTS: Right breast slightly smaller and sits slightly lateral, incisions well healed. Some glue still present Weak grip due to ganglion cyst  SENSATION: Light touch: Deficits     POSTURE: forward head, rounded shoulders  UPPER EXTREMITY AROM/PROM:  A/PROM RIGHT   eval   Shoulder extension 32, pain  Shoulder flexion 98, pain top of shoulder  Shoulder abduction 73  Shoulder internal rotation   Shoulder external rotation     (Blank rows = not tested)  A/PROM LEFT   eval  Shoulder extension 36  Shoulder flexion 135  Shoulder abduction 105  Shoulder internal rotation 55  Shoulder external rotation 93    (Blank rows = not tested)  CERVICAL AROM: All within fxl limits: mildly limited for rotation B  LYMPHEDEMA ASSESSMENTS:  SURGERY TYPE/DATE: Bilateral Mastectomies with immediat tissue expanders, 06/05/2024 NUMBER OF LYMPH NODES REMOVED: 0 CHEMOTHERAPY: NO RADIATION:NO HORMONE TREATMENT: NO INFECTIONS: NO  QUICK DASH SURVEY: 84%                                                                                                                            TREATMENT DATE:  06/28/24 Pulleys into flexion x Wall flexion and abd x 5 each - Rt arm at the ladder for a bit of help Supine with HOB a bit elevated:  LTR with palms up x 10 bil  Dowel flexion bil x 5  Chest stretch 5 x 5  PROM bil to tolerance with vcs for relaxation STM over bil UT over clothing  06/27/2024 4 post op exercises x 5 ea with flex and stargazer in supine, POC, LOS, treatment interventions   PATIENT EDUCATION:  Education details: 4 post op exercises x 5 ea with flex and stargazer in supine, POC, LOS, treatment interventions Person educated: Patient Education method: Explanation, Demonstration, and Handouts Education comprehension: verbalized understanding, demonstrated understanding  HOME EXERCISE PROGRAM: 4 post op exercises; supine flex  and stargazer, standing wall slides, scapular retraction  ASSESSMENT:  CLINICAL IMPRESSION: Pt tolerated treatment well.  Uses pillow more to remind other people not to hit into her.  She was able to lay supine for TE.   OBJECTIVE IMPAIRMENTS: decreased activity tolerance, decreased knowledge of condition, decreased ROM, decreased strength, impaired sensation, impaired UE functional use, postural dysfunction, and pain.   ACTIVITY LIMITATIONS: carrying, lifting, sleeping, bathing, dressing, reach over head, and hygiene/grooming  PARTICIPATION LIMITATIONS: cleaning, laundry, driving, community activity, and yard work  PERSONAL FACTORS: 1 comorbidity: Bilateral mastectomies with reconstruction are also affecting patient's functional outcome.   REHAB POTENTIAL: Good  CLINICAL DECISION MAKING: Stable/uncomplicated  EVALUATION COMPLEXITY: Low  GOALS: Goals reviewed with patient? Yes  SHORT TERM GOALS=LONG TERM GOALS: Target date: 08/08/2024  Pt will be independent with HEP to improve shoulder ROM Baseline: Goal status: INITIAL  2.  Pt will have bilateral shoulder ROM flexion and abd atleast 150 degrees Baseline:  Goal status: INITIAL  3.  Pt will report pain improved by atleast 50% for improved activity tolerance Baseline:  Goal status: INITIAL  4.  Quick dash will improve to no greater than 30% to demonstrate improvements in UE function Baseline:  Goal status: INITIAL  5.  Pt will be able to dress, bathe and perform light household chores independently Baseline:  Goal status: INITIAL   PLAN:  PT FREQUENCY: 2x/week  PT DURATION: 6 weeks  PLANNED INTERVENTIONS: 97164- PT Re-evaluation, 97750- Physical Performance Testing, 97110-Therapeutic exercises, 97530- Therapeutic activity, [id number]- Neuromuscular re-education, 97535- Self Care, [id number]- Manual therapy, and Patient/Family education  PLAN FOR NEXT SESSION: STM bilateral UT, pecs, lateral trunk prn, review HEP and  progress, PROM, progress as tolerated. No lifting, carrying, pulling pushing greater than 20# for 6 weeks  Larue Saddie SAUNDERS, PT 06/28/2024, 10:38 AM  "

## 2024-06-29 ENCOUNTER — Other Ambulatory Visit: Payer: Self-pay

## 2024-07-02 ENCOUNTER — Encounter: Payer: Self-pay | Admitting: Family Medicine

## 2024-07-03 ENCOUNTER — Ambulatory Visit: Payer: Self-pay

## 2024-07-04 ENCOUNTER — Ambulatory Visit: Payer: Self-pay

## 2024-07-05 ENCOUNTER — Ambulatory Visit: Payer: Self-pay

## 2024-07-09 ENCOUNTER — Ambulatory Visit: Payer: Self-pay

## 2024-07-11 ENCOUNTER — Ambulatory Visit: Payer: Self-pay | Admitting: Rehabilitation

## 2024-07-12 ENCOUNTER — Ambulatory Visit: Payer: Self-pay | Admitting: Rehabilitation

## 2024-07-13 ENCOUNTER — Ambulatory Visit: Payer: Self-pay

## 2024-07-16 ENCOUNTER — Ambulatory Visit: Payer: Self-pay

## 2024-07-18 ENCOUNTER — Ambulatory Visit: Payer: Self-pay

## 2024-07-19 ENCOUNTER — Ambulatory Visit: Payer: Self-pay | Admitting: Rehabilitation

## 2024-07-23 ENCOUNTER — Ambulatory Visit: Payer: Self-pay | Admitting: Rehabilitation

## 2024-07-25 ENCOUNTER — Ambulatory Visit: Payer: Self-pay

## 2024-07-26 ENCOUNTER — Ambulatory Visit: Payer: Self-pay

## 2024-07-30 ENCOUNTER — Ambulatory Visit: Payer: Self-pay | Admitting: Rehabilitation

## 2024-08-01 ENCOUNTER — Ambulatory Visit: Payer: Self-pay | Admitting: Rehabilitation

## 2024-08-02 ENCOUNTER — Ambulatory Visit: Payer: Self-pay | Admitting: Rehabilitation

## 2024-08-06 ENCOUNTER — Ambulatory Visit: Payer: Self-pay

## 2024-08-08 ENCOUNTER — Ambulatory Visit: Payer: Self-pay

## 2024-08-09 ENCOUNTER — Ambulatory Visit: Payer: Self-pay
